# Patient Record
Sex: Male | Born: 1965
Health system: Southern US, Community
[De-identification: ages and names within clinical notes are randomized; demographics above are authoritative.]

## PROBLEM LIST (undated history)

## (undated) DIAGNOSIS — F32A Depression, unspecified: Secondary | ICD-10-CM

## (undated) DIAGNOSIS — F0390 Unspecified dementia without behavioral disturbance: Secondary | ICD-10-CM

## (undated) DIAGNOSIS — Z9889 Other specified postprocedural states: Secondary | ICD-10-CM

## (undated) DIAGNOSIS — F4310 Post-traumatic stress disorder, unspecified: Secondary | ICD-10-CM

## (undated) DIAGNOSIS — F319 Bipolar disorder, unspecified: Secondary | ICD-10-CM

## (undated) DIAGNOSIS — I639 Cerebral infarction, unspecified: Secondary | ICD-10-CM

## (undated) DIAGNOSIS — R011 Cardiac murmur, unspecified: Secondary | ICD-10-CM

## (undated) DIAGNOSIS — I10 Essential (primary) hypertension: Secondary | ICD-10-CM

## (undated) DIAGNOSIS — N200 Calculus of kidney: Secondary | ICD-10-CM

## (undated) DIAGNOSIS — R112 Nausea with vomiting, unspecified: Secondary | ICD-10-CM

## (undated) DIAGNOSIS — Z8719 Personal history of other diseases of the digestive system: Secondary | ICD-10-CM

## (undated) DIAGNOSIS — G4730 Sleep apnea, unspecified: Secondary | ICD-10-CM

## (undated) DIAGNOSIS — F419 Anxiety disorder, unspecified: Secondary | ICD-10-CM

## (undated) DIAGNOSIS — M5030 Other cervical disc degeneration, unspecified cervical region: Secondary | ICD-10-CM

## (undated) DIAGNOSIS — G8929 Other chronic pain: Secondary | ICD-10-CM

## (undated) DIAGNOSIS — M5136 Other intervertebral disc degeneration, lumbar region: Secondary | ICD-10-CM

## (undated) DIAGNOSIS — M51369 Other intervertebral disc degeneration, lumbar region without mention of lumbar back pain or lower extremity pain: Secondary | ICD-10-CM

## (undated) DIAGNOSIS — C649 Malignant neoplasm of unspecified kidney, except renal pelvis: Secondary | ICD-10-CM

## (undated) DIAGNOSIS — N189 Chronic kidney disease, unspecified: Secondary | ICD-10-CM

## (undated) DIAGNOSIS — E785 Hyperlipidemia, unspecified: Secondary | ICD-10-CM

## (undated) DIAGNOSIS — Z87442 Personal history of urinary calculi: Secondary | ICD-10-CM

## (undated) DIAGNOSIS — M1990 Unspecified osteoarthritis, unspecified site: Secondary | ICD-10-CM

## (undated) DIAGNOSIS — F329 Major depressive disorder, single episode, unspecified: Secondary | ICD-10-CM

## (undated) HISTORY — DX: Depression, unspecified: F32.A

## (undated) HISTORY — DX: Cerebral infarction, unspecified: I63.9

## (undated) HISTORY — DX: Other cervical disc degeneration, unspecified cervical region: M50.30

## (undated) HISTORY — DX: Post-traumatic stress disorder, unspecified: F43.10

## (undated) HISTORY — DX: Other intervertebral disc degeneration, lumbar region: M51.36

## (undated) HISTORY — DX: Other intervertebral disc degeneration, lumbar region without mention of lumbar back pain or lower extremity pain: M51.369

## (undated) HISTORY — PX: URETERAL REIMPLANTION: SHX2611

## (undated) HISTORY — DX: Hyperlipidemia, unspecified: E78.5

## (undated) HISTORY — PX: HERNIA REPAIR: SHX51

## (undated) HISTORY — PX: OTHER SURGICAL HISTORY: SHX169

## (undated) HISTORY — DX: Major depressive disorder, single episode, unspecified: F32.9

## (undated) HISTORY — DX: Calculus of kidney: N20.0

---
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
## 1998-12-07 ENCOUNTER — Encounter: Payer: Self-pay | Admitting: Internal Medicine

## 1998-12-07 ENCOUNTER — Encounter: Admission: RE | Admit: 1998-12-07 | Discharge: 1998-12-07 | Payer: Self-pay | Admitting: Internal Medicine

## 1998-12-07 ENCOUNTER — Ambulatory Visit (HOSPITAL_COMMUNITY): Admission: RE | Admit: 1998-12-07 | Discharge: 1998-12-07 | Payer: Self-pay | Admitting: Internal Medicine

## 1999-07-22 DIAGNOSIS — C649 Malignant neoplasm of unspecified kidney, except renal pelvis: Secondary | ICD-10-CM

## 1999-07-22 HISTORY — DX: Malignant neoplasm of unspecified kidney, except renal pelvis: C64.9

## 1999-12-16 ENCOUNTER — Encounter: Payer: Self-pay | Admitting: Emergency Medicine

## 1999-12-16 ENCOUNTER — Encounter (INDEPENDENT_AMBULATORY_CARE_PROVIDER_SITE_OTHER): Payer: Self-pay | Admitting: *Deleted

## 1999-12-16 ENCOUNTER — Inpatient Hospital Stay (HOSPITAL_COMMUNITY): Admission: EM | Admit: 1999-12-16 | Discharge: 1999-12-24 | Payer: Self-pay | Admitting: Emergency Medicine

## 1999-12-16 ENCOUNTER — Encounter: Payer: Self-pay | Admitting: Urology

## 1999-12-17 ENCOUNTER — Encounter: Payer: Self-pay | Admitting: Urology

## 1999-12-18 ENCOUNTER — Encounter: Payer: Self-pay | Admitting: Urology

## 1999-12-19 ENCOUNTER — Encounter: Payer: Self-pay | Admitting: Urology

## 1999-12-19 HISTORY — PX: OTHER SURGICAL HISTORY: SHX169

## 2000-06-08 ENCOUNTER — Encounter: Admission: RE | Admit: 2000-06-08 | Discharge: 2000-06-08 | Payer: Self-pay | Admitting: Urology

## 2000-06-08 ENCOUNTER — Encounter: Payer: Self-pay | Admitting: Urology

## 2000-11-30 ENCOUNTER — Encounter: Payer: Self-pay | Admitting: Urology

## 2000-11-30 ENCOUNTER — Encounter: Admission: RE | Admit: 2000-11-30 | Discharge: 2000-11-30 | Payer: Self-pay | Admitting: Urology

## 2001-05-07 ENCOUNTER — Encounter: Payer: Self-pay | Admitting: Urology

## 2001-05-07 ENCOUNTER — Encounter: Admission: RE | Admit: 2001-05-07 | Discharge: 2001-05-07 | Payer: Self-pay | Admitting: Urology

## 2002-03-14 ENCOUNTER — Encounter: Admission: RE | Admit: 2002-03-14 | Discharge: 2002-03-14 | Payer: Self-pay | Admitting: Urology

## 2002-03-14 ENCOUNTER — Encounter: Payer: Self-pay | Admitting: Urology

## 2002-07-21 HISTORY — PX: OTHER SURGICAL HISTORY: SHX169

## 2003-07-28 ENCOUNTER — Ambulatory Visit (HOSPITAL_COMMUNITY): Admission: RE | Admit: 2003-07-28 | Discharge: 2003-07-28 | Payer: Self-pay | Admitting: Urology

## 2003-10-24 ENCOUNTER — Emergency Department (HOSPITAL_COMMUNITY): Admission: EM | Admit: 2003-10-24 | Discharge: 2003-10-25 | Payer: Self-pay | Admitting: Emergency Medicine

## 2003-10-30 ENCOUNTER — Encounter: Admission: RE | Admit: 2003-10-30 | Discharge: 2003-10-30 | Payer: Self-pay | Admitting: Family Medicine

## 2003-12-07 ENCOUNTER — Ambulatory Visit (HOSPITAL_COMMUNITY): Admission: RE | Admit: 2003-12-07 | Discharge: 2003-12-07 | Payer: Self-pay | Admitting: *Deleted

## 2004-06-06 ENCOUNTER — Encounter: Admission: RE | Admit: 2004-06-06 | Discharge: 2004-06-06 | Payer: Self-pay | Admitting: Internal Medicine

## 2004-08-29 ENCOUNTER — Ambulatory Visit (HOSPITAL_COMMUNITY): Admission: RE | Admit: 2004-08-29 | Discharge: 2004-08-29 | Payer: Self-pay | Admitting: Urology

## 2005-03-19 ENCOUNTER — Encounter: Admission: RE | Admit: 2005-03-19 | Discharge: 2005-03-19 | Payer: Self-pay | Admitting: General Surgery

## 2006-07-19 ENCOUNTER — Emergency Department (HOSPITAL_COMMUNITY): Admission: EM | Admit: 2006-07-19 | Discharge: 2006-07-19 | Payer: Self-pay | Admitting: Emergency Medicine

## 2006-09-16 ENCOUNTER — Inpatient Hospital Stay (HOSPITAL_COMMUNITY): Admission: RE | Admit: 2006-09-16 | Discharge: 2006-09-17 | Payer: Self-pay | Admitting: Neurosurgery

## 2006-09-16 HISTORY — PX: LAMINECTOMY AND MICRODISCECTOMY CERVICAL SPINE: SHX1912

## 2010-05-14 ENCOUNTER — Emergency Department (HOSPITAL_COMMUNITY): Admission: EM | Admit: 2010-05-14 | Discharge: 2010-05-14 | Payer: Self-pay | Admitting: Emergency Medicine

## 2010-10-09 ENCOUNTER — Other Ambulatory Visit: Payer: Self-pay | Admitting: Urology

## 2010-10-09 ENCOUNTER — Ambulatory Visit (HOSPITAL_COMMUNITY)
Admission: RE | Admit: 2010-10-09 | Discharge: 2010-10-09 | Disposition: A | Discharge status: Home / Self Care | Payer: MEDICARE | Source: Ambulatory Visit | Attending: Urology | Admitting: Urology

## 2010-10-09 DIAGNOSIS — Z85528 Personal history of other malignant neoplasm of kidney: Secondary | ICD-10-CM | POA: Insufficient documentation

## 2010-10-09 DIAGNOSIS — Z09 Encounter for follow-up examination after completed treatment for conditions other than malignant neoplasm: Secondary | ICD-10-CM | POA: Insufficient documentation

## 2010-10-09 DIAGNOSIS — R079 Chest pain, unspecified: Secondary | ICD-10-CM | POA: Insufficient documentation

## 2010-11-04 ENCOUNTER — Encounter (HOSPITAL_COMMUNITY)
Admission: RE | Admit: 2010-11-04 | Discharge: 2010-11-04 | Disposition: A | Discharge status: Home / Self Care | Payer: 59 | Source: Ambulatory Visit | Attending: Surgery | Admitting: Surgery

## 2010-11-04 DIAGNOSIS — Z01818 Encounter for other preprocedural examination: Secondary | ICD-10-CM | POA: Insufficient documentation

## 2010-11-04 DIAGNOSIS — Z01812 Encounter for preprocedural laboratory examination: Secondary | ICD-10-CM | POA: Insufficient documentation

## 2010-11-04 DIAGNOSIS — Z01810 Encounter for preprocedural cardiovascular examination: Secondary | ICD-10-CM | POA: Insufficient documentation

## 2010-11-04 LAB — SURGICAL PCR SCREEN
MRSA, PCR: NEGATIVE
Staphylococcus aureus: NEGATIVE

## 2010-11-04 LAB — BASIC METABOLIC PANEL
CO2: 30 mEq/L (ref 19–32)
Calcium: 9.2 mg/dL (ref 8.4–10.5)
Chloride: 103 mEq/L (ref 96–112)
Creatinine, Ser: 1.15 mg/dL (ref 0.4–1.5)
GFR calc Af Amer: >60 mL/min (ref >60)
GFR calc non Af Amer: >60 mL/min (ref >60)
Glucose, Bld: 98 mg/dL (ref 70–99)
Potassium: 3.9 mEq/L (ref 3.5–5.1)
Sodium: 140 mEq/L (ref 135–145)

## 2010-11-04 LAB — CBC
HCT: 42.8 % (ref 39.0–52.0)
Hemoglobin: 14.8 g/dL (ref 13.0–17.0)
MCH: 30.3 pg (ref 26.0–34.0)
MCHC: 34.6 g/dL (ref 30.0–36.0)
Platelets: 222 10*3/uL (ref 150–400)
RDW: 14 % (ref 11.5–15.5)

## 2010-11-04 LAB — HEPATIC FUNCTION PANEL
ALT: 32 U/L (ref 0–53)
Albumin: 3.7 g/dL (ref 3.5–5.2)
Alkaline Phosphatase: 93 U/L (ref 39–117)
Bilirubin, Direct: 0.1 mg/dL (ref 0.0–0.3)
Indirect Bilirubin: 0.5 mg/dL (ref 0.3–0.9)
Total Protein: 6.5 g/dL (ref 6.0–8.3)

## 2010-11-08 ENCOUNTER — Inpatient Hospital Stay (HOSPITAL_COMMUNITY)
Admission: RE | Admit: 2010-11-08 | Discharge: 2010-11-14 | DRG: 336 | Disposition: A | Discharge status: Home / Self Care | Payer: 59 | Source: Ambulatory Visit | Attending: Surgery | Admitting: Surgery

## 2010-11-08 DIAGNOSIS — M47812 Spondylosis without myelopathy or radiculopathy, cervical region: Secondary | ICD-10-CM | POA: Diagnosis present

## 2010-11-08 DIAGNOSIS — K436 Other and unspecified ventral hernia with obstruction, without gangrene: Principal | ICD-10-CM | POA: Diagnosis present

## 2010-11-08 DIAGNOSIS — E7800 Pure hypercholesterolemia, unspecified: Secondary | ICD-10-CM | POA: Diagnosis present

## 2010-11-08 DIAGNOSIS — I10 Essential (primary) hypertension: Secondary | ICD-10-CM | POA: Diagnosis present

## 2010-11-08 DIAGNOSIS — Z87891 Personal history of nicotine dependence: Secondary | ICD-10-CM

## 2010-11-08 DIAGNOSIS — F411 Generalized anxiety disorder: Secondary | ICD-10-CM | POA: Diagnosis present

## 2010-11-08 DIAGNOSIS — Z01812 Encounter for preprocedural laboratory examination: Secondary | ICD-10-CM

## 2010-11-08 DIAGNOSIS — Z01810 Encounter for preprocedural cardiovascular examination: Secondary | ICD-10-CM

## 2010-11-08 DIAGNOSIS — K560 Paralytic ileus: Secondary | ICD-10-CM | POA: Diagnosis not present

## 2010-11-08 DIAGNOSIS — Z8553 Personal history of malignant neoplasm of renal pelvis: Secondary | ICD-10-CM

## 2010-11-08 DIAGNOSIS — Z905 Acquired absence of kidney: Secondary | ICD-10-CM

## 2010-11-08 DIAGNOSIS — M129 Arthropathy, unspecified: Secondary | ICD-10-CM | POA: Diagnosis present

## 2010-11-08 HISTORY — PX: OTHER SURGICAL HISTORY: SHX169

## 2010-11-09 LAB — COMPREHENSIVE METABOLIC PANEL
ALT: 36 U/L (ref 0–53)
AST: 37 U/L (ref 0–37)
Albumin: 3.2 g/dL — ABNORMAL LOW (ref 3.5–5.2)
Calcium: 8.7 mg/dL (ref 8.4–10.5)
Chloride: 98 mEq/L (ref 96–112)
Creatinine, Ser: 1.16 mg/dL (ref 0.4–1.5)
GFR calc Af Amer: >60 mL/min (ref >60)
Glucose, Bld: 126 mg/dL — ABNORMAL HIGH (ref 70–99)
Potassium: 3.9 mEq/L (ref 3.5–5.1)
Sodium: 136 mEq/L (ref 135–145)
Total Bilirubin: 0.9 mg/dL (ref 0.3–1.2)
Total Protein: 6.6 g/dL (ref 6.0–8.3)

## 2010-11-09 LAB — CBC
HCT: 39 % (ref 39.0–52.0)
Hemoglobin: 13.7 g/dL (ref 13.0–17.0)
MCHC: 35.1 g/dL (ref 30.0–36.0)
MCV: 88.2 fL (ref 78.0–100.0)
Platelets: 232 10*3/uL (ref 150–400)
RBC: 4.42 MIL/uL (ref 4.22–5.81)
RDW: 14.3 % (ref 11.5–15.5)
WBC: 13.1 10*3/uL — ABNORMAL HIGH (ref 4.0–10.5)

## 2010-11-09 LAB — GLUCOSE, CAPILLARY: Glucose-Capillary: 108 mg/dL — ABNORMAL HIGH (ref 70–99)

## 2010-11-10 LAB — GLUCOSE, CAPILLARY
Glucose-Capillary: 110 mg/dL — ABNORMAL HIGH (ref 70–99)
Glucose-Capillary: 107 mg/dL — ABNORMAL HIGH (ref 70–99)

## 2010-11-11 ENCOUNTER — Inpatient Hospital Stay (HOSPITAL_COMMUNITY): Payer: 59

## 2010-11-11 LAB — BASIC METABOLIC PANEL
BUN: 8 mg/dL (ref 6–23)
Calcium: 8.6 mg/dL (ref 8.4–10.5)
Chloride: 100 mEq/L (ref 96–112)
Creatinine, Ser: 1.04 mg/dL (ref 0.4–1.5)
GFR calc Af Amer: >60 mL/min (ref >60)

## 2010-11-11 LAB — CBC
HCT: 39.6 % (ref 39.0–52.0)
Hemoglobin: 13.2 g/dL (ref 13.0–17.0)
MCH: 30.3 pg (ref 26.0–34.0)
MCHC: 33.3 g/dL (ref 30.0–36.0)
MCV: 90.8 fL (ref 78.0–100.0)
RDW: 14 % (ref 11.5–15.5)

## 2010-11-13 LAB — CREATININE, SERUM
GFR calc Af Amer: >60 mL/min (ref >60)
GFR calc non Af Amer: >60 mL/min (ref >60)

## 2010-11-13 LAB — CBC
MCV: 88.5 fL (ref 78.0–100.0)
Platelets: 271 10*3/uL (ref 150–400)
RBC: 4.34 MIL/uL (ref 4.22–5.81)
RDW: 13.9 % (ref 11.5–15.5)
WBC: 10.9 10*3/uL — ABNORMAL HIGH (ref 4.0–10.5)

## 2010-11-18 NOTE — Op Note (Signed)
NAME:  Jake Brown, COVIN NO.:  [id number]  MEDICAL RECORD NO.:  [id number]           PATIENT TYPE:  I  LOCATION:  5124                         FACILITY:  [facility]  PHYSICIAN:  Ardeth Sportsman, MD     DATE OF BIRTH:  1966-02-25  DATE OF PROCEDURE:  11/08/2010 DATE OF DISCHARGE:                              OPERATIVE REPORT   PRIMARY CARE PHYSICIAN:  Broadus John T. Pamalee Leyden, MD at [facility].  UROLOGIST:  Bertram Millard. Dahlstedt, MD  OPERATING SURGEON:  Ardeth Sportsman, MD  ASSISTANT:  RN.  PREOPERATIVE DIAGNOSIS:  Right subcostal ventral incisional hernia, status post right radical nephrectomy.  POSTOPERATIVE DIAGNOSIS:  Right subcostal ventral incisional hernia, status post right radical nephrectomy.  PROCEDURE PERFORMED: 1. Laparoscopic lysis of adhesions x60 minutes. 2. Laparoscopic ventral hernia repair with mesh.  ANESTHESIA: 1. General anesthesia. 2. Local anesthetic and a field block around all fascial stitch and     port sites. 3. On-Q continuous bupivacaine with pain pump.  SPECIMENS:  None.  DRAINS:  None.  ESTIMATED BLOOD LOSS:  Minimal.  COMPLICATIONS:  None apparent.  INDICATIONS:  Mr. Balis is a 45 year old morbidly obese male who is the survivor of renal cancer status post right radical nephrectomy back in 2001, followed by Dr. Marcine Matar.  He notes some increasing bulge and asymmetry 6 years ago, it did not seem consistent with a hernia; however, it has become more pronounced and suspicious for it.  Pathophysiology of inguinal herniation with its natural histories were discussed.  After discussion, recommendation was made for diagnostic laparoscopy intervention with lysis of adhesions and possible ventral hernia repair.  Risks, benefits, and alternatives were discussed. Questions were answered, and he agreed to proceed.  OPERATIVE FINDINGS:  He had a 7 x 17 cm ellipsoid oblique ventral hernia in the  posterior half of his nephrectomy incision.  It has omentum stuck it, transverse colon is nearby, but not incarcerated.  DESCRIPTION OF PROCEDURE:  Informed consent was confirmed.  The patient underwent general anesthesia without any difficulty.  He received IV antibiotics.  A Foley catheter was sterilely placed.  He was positioned in left side down decubitus fashion with a sand bag in place at about 45 degree angle.  We did careful positioning with anesthesia to help protect both upper extremities.  He was securely taped in numerous locations as well with belt.  His chest and abdomen were clipped, prepped, and draped in sterile fashion.  Surgical time-out confirmed our plan.  I placed a #5-mm port through the inferior part of the umbilicus using a cutdown technique.  I induced carbon dioxide insufflation.  A camera inspection noted clean entry.  Under direct visualization, I placed 5-mm ports in the right lower quadrant and left mid abdomen.  I began to do lysis of adhesions.  I primarily used a cold scissors and blunt dissections as well as some focused cautery.  I did take down the falciform ligament and took the anterior hepatic ligament about half way up towards the diaphragm to get good view.  I could see the transverse colon was near by, but I had  5 cm of greater omentum between the colon and the hernia.  Eventually, I freed that all off and came laterally and mobilized the right hepatic flexure a little bit to make sure he had a plenty of view and good overlap.  I measured out the defect and saw the obvious laxity and thinning of the wall, this was ventral hernia.  I choose a 20 x 30 cm dual-sided mesh (Parietex/Seprafilm).  I rolled the abdomen and secured it to the anterior abdominal wall using #1 interrupted Prolene x16 around the periphery.  The lower half and medial quadrants were able to tack transfascially.  I did have to take bites along the right subcostal ridge on  the right superior edge and corner as there were about 3 cm of healthy fascia between the hernia and the defect itself.  I took those bites a little more towards the central part of the mesh, his periphery side had plenty of overlap.  I gradually did this pull to make sure I had at least 5 cm circumferential coverage, I did.  I did camera inspection and noted good hemostasis.  I did lower the insufflation, tied the fascial stitch down.  I did reinspection and had good coverage.  I used laparoscopic spiral metal tacker x2 to help secure around the periphery, and then a small absorbable tacker as well just to have a few extra tacks to help pull the central part of the mesh down.  His abdominal was rather thin, may be only 2 cm at the most with insufflations and I felt like those tacks were getting to the posterior fascia well.  I placed a stitch around the 0 port site.  I had placed an epigastric port site about 10 mm in size to get the mesh in and I closed that down using 0 Vicryl stitch.  I placed the On-Q catheter in the preperitoneal plane using laparoscopic tunnelers under direct visualization.  I evacuated carbon dioxide and removed all the ports.  I tied the fascial stitch down.  I closed the skin and placed Steri-Strips on the puncture sites of the transabdominal fascial stitches.  An On-Q was connected. Abdominal binder was placed.  The patient was extubated and taken to recovery room in stable condition.  I discussed postop care with the patient in detail in my office and again described the surgery.  I am also discussing with his family as well.  Given the location and complexity, I will monitor him at least overnight as I suspect to have above average pain requirements given the large defect and being near the flank and subcostal ridge.     Ardeth Sportsman, MD     SCG/MEDQ  D:  11/08/2010  T:  11/09/2010  Job:  [id number]  Electronically Signed by Karie Soda MD on  11/18/2010 01:13:09 PM

## 2010-11-18 NOTE — Discharge Summary (Signed)
NAME:  Jake Brown, Jake Brown NO.:  [id number]  MEDICAL RECORD NO.:  [id number]           PATIENT TYPE:  I  LOCATION:  5124                         FACILITY:  [facility]  PHYSICIAN:  Ardeth Sportsman, MD     DATE OF BIRTH:  28-Feb-1966  DATE OF ADMISSION:  11/08/2010 DATE OF DISCHARGE:  11/14/2010                              DISCHARGE SUMMARY   PRIMARY CARE PHYSICIAN:  Dr. Lynnea Ferrier at [facility].  UROLOGIST:  Bertram Millard. Dahlstedt, MD  SURGEON:  Ardeth Sportsman, MD  PRINCIPAL DIAGNOSES: 1. Right ventral wall incisional hernia, incarcerated with omentum,     right upper quadrant. 2. Postoperative ileus. 3. Hypertension. 4. Cervical degenerative disk disease and spondylosis.  OTHER DIAGNOSES: 1. Morbid obesity. 2. Hypercholesterolemia. 3. Renal cancer, status post transabdominal right radical nephrectomy     for right renal cell carcinoma.  PROCEDURE PERFORMED:  Laparoscopic lysis of adhesions and ventral hernia repair with mesh on November 08, 2010.  DISCHARGE MEDICATIONS:  As noted in the chart include amlodipine 5 mg p.o. daily, psyllium p.o. daily, hydrocodone/Tylenol 1-2 p.o. q.4 h. p.r.n. pain, Tylenol p.r.n. pain, ice pads, heating pads, warm showers p.r.n. pain, and I believe, he is on Losartan and hydrochlorothiazide as well.  HOSPITAL COURSE:  [name] is a 45 year old morbidly obese male who had a right radical nephrectomy for large renal cell carcinoma for which he is a survivor for over a decade.  He developed pain and discomfort in his right upper quadrant subcostal incision and workup was concerning for hernia.  He was taken to the operating room, did laparoscopic lysis of adhesions and ventral hernia repair with mesh.  Postoperatively, he was mobilized and started on p.o.  However, he developed some nausea, vomiting, and abdominal pain.  He required a nasogastric tube placement.  Workup by radiographic films was  consistent with ileus.  He was mobilized.  We controlled his pain and tried to maximize nonnarcotic pain control.  Given his prior kidney, having one kidney remaining, we held off nonsteroidals and used primarily acetaminophen IV and then enterally.  Gradually, he began to have flatus.  His nasogastric tube was clamped.  He had bowel movements.  We moved it.  He was advanced on his diet.  By the time of discharge, he was walking well in the hallways.  He had good pain control on oral hydrocodone (we switched him off oxycodone). He was having flatus and bowel movements.  He was tolerating solid diet.  Patient improved and __________ discharged home with following instructions: 1. He is to return to see me in about 2-3 weeks. 2. He should use ice, heat, warm showers, Tylenol, hydrocodone in     combination for pain control. 3. He should walk an hour a day. 4. He should taken psyllium fiber to avoid any further issues of     constipation or diarrhea. 5. He should call if he has worsening fever, chills, sweats,     uncontrolled pain, nausea, vomiting, diarrhea, and other concerns.     Ardeth Sportsman, MD     SCG/MEDQ  D:  11/14/2010  T:  11/14/2010  Job:  [id number]  cc:   [facility] Dr. Lora Paula. Dahlstedt, M.D.  Electronically Signed by Karie Soda MD on 11/18/2010 01:13:16 PM

## 2010-11-27 ENCOUNTER — Encounter (INDEPENDENT_AMBULATORY_CARE_PROVIDER_SITE_OTHER): Payer: Self-pay | Admitting: Surgery

## 2010-12-06 NOTE — Consult Note (Signed)
[place]  Patient:    Jake Brown, Jake Brown                  MRN: [id number] Adm. Date:  [id number] Attending:  Tania Ade Dictator:   [phone] CC:         Jake Brown, M.D.             Claudette Laws, M.D.                          Consultation Report  REASON FOR CONSULTATION: Jake Brown is a 45 year old gentleman who is being admitted to the hospital today by the urology service for evaluation and treatment of a large right renal cell carcinoma.  I have been asked to see the patient because of nausea, vomiting, and abdominal pain that started late last evening and early this morning.  HISTORY OF PRESENT ILLNESS: The patients history is that he has been a previously healthy man with bouts of recurrent prostatitis, treated in the past with antibiotics.  He noted gross hematuria starting approximately one month prior to admission and he sought medical attention from his primary care physician, Dr. Dorothe Pea, who got a urinalysis which showed gross hematuria.  He was referred to Dr. Etta Grandchild and an ultrasound was done which showed a mass in the right kidney; that was Thursday of this past week.  Friday he had a CAT scan done at [facility] and I have a faxed report obtained showing a large right renal mass 18 cm in size, and no other pathology apparently evident.  The patient was to have further work-up done this coming Friday but last night the patient developed abdominal pain, crampy in nature with a crescendo-decrescendo pattern, each episode lasting for approximately one-half hour.  This started late last night and early into this morning.  He subsequently vomited gastric contents and undigested food was noted.  No blood was seen.  He had diarrhea associated with this with a number of episodes of loose stools, the last one occurring at approximately 3 a.m.  He continued to vomit, however, up until just a few hours ago, and  I had seen him earlier this morning in the emergency room at approximately 8:30 a.m.  He states that the pain comes on followed by the vomiting and diarrhea, and the pain is self-relieved.  He came to the emergency room and was given Demerol with relief of his pain.  PAST MEDICAL HISTORY: The patient states that prior to this he had otherwise been healthy without any medical problems and no hospitalizations.  PAST SURGICAL HISTORY: No surgeries.  MEDICATIONS: He takes no chronic medications.  ALLERGIES: No known drug allergies.  REVIEW OF SYSTEMS: Otherwise negative.  There has been no fever, chills, night sweats, no weight loss, chest pain, shortness of breath, endocrine disturbances, no history of diabetes, hypertension, neurologic deficits, and no prior history of GI disorder.  No change in bowel habits, no hematochezia. Melena has been noted and the vomitus, as noted, has been of food.  He does have reflux symptomatology.  FAMILY HISTORY: Only notable for reflux in his father.  SOCIAL HISTORY: The patient does not smoke or drink alcohol to any significant degree.  PHYSICAL EXAMINATION:  GENERAL: On examination at this time he has received Demerol but he is alert and oriented, slightly drowsy, but responds appropriately to all questions. He is a very well-developed, well-nourished, muscular white  male.  HEENT: Grossly unremarkable.  NECK: Thyroid not enlarged.  No bruits were heard.  LUNGS: Clear.  HEART: Regular rhythm without murmurs, rubs, or gallops.  ABDOMEN: Tender throughout, though more so in the left upper quadrant than other areas.  Bowel sounds are present.  RECTAL: Deferred at this time.  EXTREMITIES: Normal.  SKIN: Tattoo changes over the arms, shoulders, and upper back.  LABORATORY DATA: CBC, amylase, lipase, CMET are all unremarkable.  Abdominal x-ray shows a large right kidney shadow and flat plate shows a distended stomach, and upright shows an  air fluid level in the stomach area.  IMPRESSION:  1. Renal cell carcinoma.  2. Dilated gastritic bubble with air fluid levels, probably represents     gastritic outlet obstruction, presumably extrinsically from tumor     compression, probably of the duodenum either directly or by lymph node     enlargement there.  3. Tattoos, rule out hepatitis C.  RECOMMENDATIONS: The CT scan of just a few days ago does not mention bowel obstruction and therefore I think an Upper GI Series with an NG tube in place would be helpful.  The NG tube could be used then to suction out the material if obstruction is present, which I think it will be, and subsequently would then use the NG tube to decompress the stomach.  I believe he will probably have a nephrectomy and he may require a general surgery consultation to evaluate the possibility of some surgical treatment.  He may need to have some intestinal resection if the tumor is impinging on small bowel.  With acute nature would check a hepatitis C virus antibody study just because of the tattoos as he would be at increased risk for hepatitis C and although this is not an acute problem if he recovers eventfully from surgery consideration at some later date for treatment might be necessary, and if his hepatitis C virus study is in fact positive would ask surgery to do a liver biopsy during surgery to assess liver status related to the HCV infection if present. DD:  12/16/99 TD:  12/16/99 Job: [id number]

## 2010-12-06 NOTE — Op Note (Signed)
[place]  Patient:    Jake Brown, Jake Brown                  MRN: [id number] Proc. Date: 12/19/99 Adm. Date:  [id number] Attending:  Monica Becton                           Operative Report  PREOPERATIVE DIAGNOSIS:  Large right renal cell carcinoma.  POSTOPERATIVE DIAGNOSIS:  Large right renal cell carcinoma.  PROCEDURE:  Transabdominal right radical nephrectomy.  SURGEON:  Claudette Laws, M.D.  ASSISTANT:  Norton Blizzard, M.D.  DESCRIPTION OF PROCEDURE:  The patient was prepped and draped in the supine position under intubated general anesthesia.  The chest was torqued about 45 degrees over inflatable beanbag.  Foley catheter was placed.  The right arm was  brought over to an arm rest.  Transabdominal transverse incision was made about a point halfway between the xiphoid process and the umbilicus, across to the 8th r 9th costal cartilage.  The rectus muscle was cut and then the peritoneum was identified.  The peritoneal cavity was opened. The incision was carried laterally, excising the internal, external and transversalis layer.  At this point, it was  felt that we did not need to open the chest, so a Buchwalter retractor was brought into the field, and initial palpation of the liver showed no nodules.  The gallbladder contained no stones.  Initially, we reflected the ascending colon off the tumor mass, along the white line of Toldt. We went up just around the hepatic flexure and then down to the cecum. Because the kidney had been infarcted the day before, there were not many collaterals to contend with.  At this point, it was  felt that we could get around the tumor, lifted from beneath the liver off the duodenum and I would still have good exposure. The duodenum was kocherized, and the initial part of the dissection was securing the pedicle.  The renal vein was identified and secured with the articulating linear cutter,  the ATW 35 mm vascular, thin.  We then picked up the right renal artery and this was also secured with he linear cutter.  At this point, then, we were able to work our way down inferiorly. Initially, we picked up the gonadal vein and this was also secured with the articulating cutter.  The ureter was identified, cut and tied with 2-0 chromic distally.  Then working out way outside of [employer] fascia, we were able to deliver a huge mass without having to get into the liver or gallbladder; it came out nicely.We then inspected the wound. There was a small laceration in the capsule of the liver, which we secured with a 4-0 chromic.  There was some retroperitoneal  bleeding which we secured with a 3-0 Vicryl MH needle.  At the conclusion, there was no bleeding.  The wound appeared dry.  It was irrigated with copious amounts of saline.  The bowel contents were then dropped back into their normal position. The wound was then closed in two layers using a #1 PDS for the peritoneum and the external, internal and oblique layers. The rectus muscle was reapproximated and  then approximately 40 cc of 1/4% Marcaine was injected subcutaneously. Skin was  approximated with skin staples.  At the end of the case, the sponge and needle counts were correct. Bleeding was estimated at only about 200-300 cc.  No blood  was given. The patient tolerated the procedure well and was returned to the recovery room in satisfactory condition. DD:  12/19/99 TD:  12/23/99 Job: [id number]

## 2010-12-06 NOTE — Discharge Summary (Signed)
[place]  Patient:    Jake Brown, Jake Brown                  MRN: [id number] Adm. Date:  [id number] Disc. Date: [id number] Attending:  Monica Becton CC:         Jethro Bastos, M.D., [facility] [place]             D. Karle Plumber, M.D.             Sabino Gasser, M.D.                           Discharge Summary  PRESENT ILLNESS:  This 45 year old otherwise healthy man came in to our office two weeks ago with some painless gross hematuria.  Subsequent CT scan revealed a large mass in the right kidney.  This was an obvious renal cell carcinoma. Then suddenly he developed some nausea and vomiting and was admitted to the hospital here on Dec 16, 1999, and was seen by Dr. Sabino Gasser and thought to have gastric outlet obstruction from his large renal mass which was impinging on the duodenum.  An NG tube was placed, and he was admitted for further workup and plans to remove the mass which was about 18 cm in size.  The patient otherwise is in good health, although he is quite heavy, but no major medical problems.  No allergies.  He is a Technical brewer for [employer].  The only thing significant in the history is that he has multiple tattoos on his body and, therefore, would be at risk for hepatitis C.  PERTINENT LABORATORY DATA:  EKG showed normal sinus rhythm with some left atrial enlargement.  His preop bone scan was negative for metastatic disease as was a CT scan of the chest.  He also had an upper GI series showing obstruction at the duodenal level.  On laboratory data, his hemoglobin was 14.5, hematocrit 42.9.  Electrolytes were normal with a BUN of 10, creatinine 1.2.  Hepatitic C antibody was negative.  Anti-HCV was negative.  His postop hemoglobin stabilized at 12.2, hematocrit 39.3.  Electrolytes remained normal with a postop creatinine of 1.4, BUN 12.  PT and PTT were normal.  Pathology report was a renal cell carcinoma  with negative margins, no invasion into the pararenal fat, and no invasion of the renal vein.  This was a PT II lesion.  COURSE IN THE HOSPITAL:  The patient was admitted as an emergency on Dec 16, 1999.  NG tube was placed.  He was started on IV fluids.  Appropriate workup as mentioned in the Present Illness ensued, and then preparations were made for surgery.  One day prior to surgery, he was taken to radiology where he underwent selective embolization and infarction of the right kidney with absolute alcohol.  This was done in an attempt to decrease his collateral vessels and hopefully make the surgery less bloody.  Indeed, this proved to be the case at surgery.  He had a large renal mass which we removed transabdominally on Dec 19, 1999.  Postop, he had an uneventful, essentially afebrile postop course.  He did have some pain controlled with continuous morphine drip, and then gradually he was weaned off the drip and switched to p.o. medications.  He gradually started eating again and at discharge was afebrile.  The incision was healing well. He had a normal diet, was feeling good.  Pain was controlled with p.o. Tylox.  PLAN:  The plan now is to remove his staples in about five days, and the followup will be a CT scan in approximately six months.  He is to call me for any problems in the meantime.  Incidentally, he does have mitral valve prolapse and was covered with Cipro preop.  FINAL IMPRESSION: 1. An 18 cm renal cell carcinoma, right kidney (pathologic stage PT II). 2. Exogenous obesity. 3. History of mitral valve prolapse. 4. Multiple tattoos on body.  CONDITION ON DISCHARGE:  Improved.  OPERATION: 1. Transabdominal right radical nephrectomy. 2. Preop infarction of right kidney in radiology.  DISCHARGE MEDICATIONS:  To include Tylox #50 p.r.n. pain.  DISPOSITION:  Regular diet, force fluids, limited activity.  To return to the office in five to six days for skin staple  removal. DD:  12/24/99 TD:  12/26/99 Job: [id number]
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
## 2010-12-06 NOTE — Op Note (Signed)
NAME:  Jake Brown, Jake Brown NO.:  [id number]   MEDICAL RECORD NO.:  [id number]          PATIENT TYPE:  INP   LOCATION:  3008                         FACILITY:  [facility]   PHYSICIAN:  Hewitt Shorts, M.D.DATE OF BIRTH:  07/19/1966   DATE OF PROCEDURE:  09/16/2006  DATE OF DISCHARGE:                               OPERATIVE REPORT   PREOPERATIVE DIAGNOSES:  Right C6-7 cervical disk herniation, cervical  spondylosis, cervical degenerative disk disease and cervical  radiculopathy.   POSTOPERATIVE DIAGNOSES:  Right C6-7 cervical disk herniation, cervical  spondylosis, cervical degenerative disk disease and cervical  radiculopathy.   PROCEDURES:  Right C6-7 posterior cervical laminotomy, foraminotomy and  microdiskectomy with microdissection.   SURGEON:  Hewitt Shorts, M.D.   ASSISTANT:  Clydene Fake, M.D.   ANESTHESIA:  General endotracheal.   INDICATIONS:  The patient is a 45 year old man, who presented with an  acute right C7 cervical radiculopathy.  He has multilevel degenerative  disk disease with spondylosis, but he had a large disk herniation  laterally to the right at C6-7, and the decision was made to proceed  with a posterior cervical diskectomy.   DESCRIPTION OF PROCEDURES:  The patient was brought to the operating  room, placed under general endotracheal anesthesia.  The 3-pin Mayfield  headholder was applied.  Prior to positioning the patient, a central  line was placed by the anesthesia service, with x-ray confirmation with  location within the right atrium, and during the procedure, the  anesthesia service monitored the patient with both end-tidal CO2  monitoring and precordial Doppler monitoring.  No air embolisms were  experienced during the procedure.   The patient was brought to a sitting position, and then the posterior  aspect of the neck was prepped with Betadine Soap and Solution and  draped in a sterile fashion.  The midline was  infiltrated with local  anesthetic with epinephrine, and the C6-7 level identified and a midline  incision was made over the C6-7 level and carried down through the  subcutaneous tissue.  Bipolar cautery and electrocautery were used to  maintain hemostasis.  Dissection was carried down to the posterior  cervical fascia, which was incised on the right side of the midline, and  the paracervical musculature was dissected from the spinous processes  and laminae in subperiosteal fashion.  The C6-7 level was identified and  an x-ray taken to confirm the localization, and then a self-retaining  retractor was placed and we exposed the lateral margin of the  interlaminar space on the right side.  The operating microscope was  draped and brought into the field to provide additional magnification,  illumination and visualization, and a decompression was performed using  microdissection and microsurgical technique.  A laminotomy was performed  using the X-Max drill and Kerrison punches.  The ligamentum flavum was  carefully removed, and we identified the lateral margin of the thecal  sac and the exiting right C7 nerve root.  Caudal to the nerve root, we  encountered a large disk herniation.  The overlying epidural fascial  tissues were incised and the fragment extruded.  We  were able to remove  the free fragments in a piecemeal fashion, and in the end, all loose  fragments of disk material were removed from the epidural space with  good decompression of the exiting right C7 nerve root.  The wound was  irrigated with Bacitracin solution and checked for hemostasis, which was  established with the use of Gelfoam soaked in thrombin, as well as  bipolar cautery.  Once hemostasis was established and confirmed, we  proceeded with closure.  The deep fascia was closed with interrupted,  undyed 1 Vicryl sutures.  The subcutaneous layer was closed with  interrupted, inverted 2-0 undyed Vicryl sutures, and the  subcuticular  layer was closed with interrupted, inverted 2-0 and 3-0 undyed Vicryl  sutures, and the skin edges were approximated with Dermabond.  The  procedure was tolerated well and the estimated blood loss was 25 mL.  Sponge counts were correct.  Following surgery, the patient was brought  back to a supine position.  The 3-pin Mayfield headholder was removed.  The patient was reversed from the anesthetic, extubated and then  transferred to the recovery room for further care.      Hewitt Shorts, M.D.  Electronically Signed     RWN/MEDQ  D:  09/16/2006  T:  09/16/2006  Job:  [id number]

## 2011-05-26 ENCOUNTER — Encounter (HOSPITAL_COMMUNITY): Payer: Self-pay | Admitting: Pharmacy Technician

## 2011-06-03 ENCOUNTER — Encounter (HOSPITAL_COMMUNITY): Payer: Self-pay | Admitting: *Deleted

## 2011-06-03 NOTE — Progress Notes (Signed)
Pt reminded to take laxative and  bring blue folder

## 2011-06-05 ENCOUNTER — Ambulatory Visit (HOSPITAL_COMMUNITY)
Admission: RE | Admit: 2011-06-05 | Discharge: 2011-06-05 | Disposition: A | Discharge status: Home / Self Care | Payer: 59 | Source: Ambulatory Visit | Attending: Urology | Admitting: Urology

## 2011-06-05 ENCOUNTER — Encounter (HOSPITAL_COMMUNITY)
Admission: RE | Disposition: A | Discharge status: Home / Self Care | Payer: Self-pay | Source: Ambulatory Visit | Attending: Urology

## 2011-06-05 ENCOUNTER — Encounter (HOSPITAL_COMMUNITY): Payer: Self-pay | Admitting: *Deleted

## 2011-06-05 ENCOUNTER — Ambulatory Visit (HOSPITAL_COMMUNITY): Payer: 59

## 2011-06-05 DIAGNOSIS — N201 Calculus of ureter: Secondary | ICD-10-CM

## 2011-06-05 DIAGNOSIS — I10 Essential (primary) hypertension: Secondary | ICD-10-CM | POA: Insufficient documentation

## 2011-06-05 DIAGNOSIS — G4730 Sleep apnea, unspecified: Secondary | ICD-10-CM | POA: Insufficient documentation

## 2011-06-05 DIAGNOSIS — N200 Calculus of kidney: Secondary | ICD-10-CM | POA: Insufficient documentation

## 2011-06-05 HISTORY — DX: Chronic kidney disease, unspecified: N18.9

## 2011-06-05 HISTORY — DX: Unspecified osteoarthritis, unspecified site: M19.90

## 2011-06-05 HISTORY — DX: Cardiac murmur, unspecified: R01.1

## 2011-06-05 HISTORY — DX: Malignant neoplasm of unspecified kidney, except renal pelvis: C64.9

## 2011-06-05 HISTORY — DX: Essential (primary) hypertension: I10

## 2011-06-05 HISTORY — DX: Anxiety disorder, unspecified: F41.9

## 2011-06-05 SURGERY — LITHOTRIPSY, ESWL
Anesthesia: LOCAL | Laterality: Left

## 2011-06-05 MED ORDER — DEXTROSE-NACL 5-0.45 % IV SOLN
INTRAVENOUS | Status: DC
  Administered 2011-06-05 (×2): via INTRAVENOUS

## 2011-06-05 MED ORDER — DIAZEPAM 5 MG PO TABS
10.0000 mg | ORAL_TABLET | ORAL | Status: AC
  Administered 2011-06-05: 10 mg via ORAL

## 2011-06-05 MED ORDER — ONDANSETRON HCL 4 MG/2ML IJ SOLN
4.0000 mg | Freq: Once | INTRAMUSCULAR | Status: AC
  Administered 2011-06-05: 4 mg via INTRAVENOUS

## 2011-06-05 MED ORDER — DIPHENHYDRAMINE HCL 25 MG PO CAPS
25.0000 mg | ORAL_CAPSULE | ORAL | Status: AC
  Administered 2011-06-05: 25 mg via ORAL

## 2011-06-05 MED ORDER — ONDANSETRON HCL 4 MG/2ML IJ SOLN
INTRAMUSCULAR | Status: AC
  Administered 2011-06-05: 4 mg via INTRAVENOUS
  Filled 2011-06-05: qty 2

## 2011-06-05 MED ORDER — ACETAMINOPHEN 10 MG/ML IV SOLN
1000.0000 mg | Freq: Once | INTRAVENOUS | Status: AC
  Administered 2011-06-05: 1000 mg via INTRAVENOUS
  Filled 2011-06-05: qty 100

## 2011-06-05 MED ORDER — OXYCODONE-ACETAMINOPHEN 5-325 MG PO TABS
ORAL_TABLET | ORAL | Status: AC
  Administered 2011-06-05: 1 via ORAL
  Filled 2011-06-05: qty 1

## 2011-06-05 MED ORDER — OXYCODONE-ACETAMINOPHEN 5-325 MG PO TABS
1.0000 | ORAL_TABLET | ORAL | Status: DC | PRN
  Administered 2011-06-05: 1 via ORAL

## 2011-06-05 MED ORDER — LEVOFLOXACIN 500 MG PO TABS
500.0000 mg | ORAL_TABLET | ORAL | Status: AC
  Administered 2011-06-05: 500 mg via ORAL
  Filled 2011-06-05: qty 1

## 2011-06-05 NOTE — H&P (Signed)
  Date of Initial H&P: 05/07/2011  History reviewed, patient examined, no change in status, stable for surgery.

## 2011-06-05 NOTE — H&P (Signed)
Urology Admission H&P  Chief Complaint: Left sided kidney st  History of Present Illness:    This man is here today for l ESLeft He does have a history of renal cell carcinoma, and had a right radical nephrectomy for a large right renal tumor in May of 2001 by Dr. Javier Glazier. He saw Marcello Fennel in our officelrecenfor this hematuria. Hematuria CT was performed. This showed a small left upper pole renal cyst, a 10 mm left lower pole renal calculus, but no other abnormalities. He has had intermittent hematuria continuously. He does have left flank and back pain with some anterior radiation.   Past Medical History  Diagnosis Date  . Difficult intubation   . Chronic kidney disease   . Hypertension   . Arthritis   . Anxiety   . Hiatal hernia   . Heart murmur     no symptoms  . Cancer of kidney     right renal carcinoma (nephrectomy )   Past Surgical History  Procedure Date  . Right nephrectomy 2001  . C4-5  surgery 2004    Home Medications:  Prescriptions prior to admission  Medication Sig Dispense Refill  . AMLODIPINE BESYLATE PO Take 10 mg by mouth every morning.       Marland Kitchen atorvastatin (LIPITOR) 10 MG tablet Take 10 mg by mouth every morning.       Marland Kitchen HYDROcodone-acetaminophen (VICODIN ES) 7.5-750 MG per tablet Take 1 tablet by mouth every 8 (eight) hours as needed. PAIN       . oxyCODONE-acetaminophen (PERCOCET) 5-325 MG per tablet Take 1 tablet by mouth every 4 (four) hours as needed. pain       . Multiple Vitamins-Minerals (MULTIVITAMINS THER. W/MINERALS) TABS Take 1 tablet by mouth daily.         Allergies: No Known Allergies  History reviewed. No pertinent family history. Social History:  reports that he has never smoked. He does not have any smokeless tobacco history on file. He reports that he does not drink alcohol or use illicit drugs.  Review of Systems  Constitutional: Negative.   HENT: Negative.   Eyes: Negative.   Gastrointestinal: Positive for abdominal pain.    Genitourinary: Positive for hematuria.  Skin: Negative.   All other systems reviewed and are negative.    Physical Exam:  Vital signs in last 24 hours: Temp:  [97.5 F (36.4 C)] 97.5 F (36.4 C) (11/15 0540) Pulse Rate:  [96] 96  (11/15 0540) Resp:  [18] 18  (11/15 0540) BP: (171)/(98) 171/98 mmHg (11/15 0540) SpO2:  [96 %] 96 % (11/15 0540) Weight:  [124.739 kg (275 lb)] 275 lb (124.739 kg) (11/15 0540) Physical Exam  Constitutional: He is oriented to person, place, and time. He appears well-developed and well-nourished.       Obese  HENT:  Head: Normocephalic and atraumatic.  Eyes: EOM are normal. Pupils are equal, round, and reactive to light.  Neck: Normal range of motion.  Cardiovascular: Normal rate and regular rhythm.   Respiratory: Effort normal and breath sounds normal.  GI: Soft. Bowel sounds are normal.  Genitourinary:       Not examined  Musculoskeletal: Normal range of motion.  Neurological: He is alert and oriented to person, place, and time.  Skin: Skin is warm and dry.  Psychiatric: He has a normal mood and affect. His behavior is normal.    Laboratory Data:  No results found for this or any previous visit (from the past 24 hour(s)). No  results found for this or any previous visit (from the past 240 hour(s)). Creatinine: No results found for this basename: CREATININE:7 in the last 168 hours Baseline Creatinine:  Impression/Assessment:  Symptomatic left renal calculus  Plan:  Left ESWL  Marcine Matar M 06/05/2011, 7:48 AM

## 2011-06-05 NOTE — Progress Notes (Signed)
No vomiting sincce Zofran given

## 2012-01-16 ENCOUNTER — Observation Stay (HOSPITAL_COMMUNITY): Payer: 59

## 2012-01-16 ENCOUNTER — Other Ambulatory Visit: Payer: Self-pay | Admitting: Urology

## 2012-01-16 ENCOUNTER — Observation Stay (HOSPITAL_COMMUNITY)
Admission: AD | Admit: 2012-01-16 | Discharge: 2012-01-18 | Disposition: A | Discharge status: Home / Self Care | Payer: 59 | Source: Ambulatory Visit | Attending: Urology | Admitting: Urology

## 2012-01-16 ENCOUNTER — Encounter (HOSPITAL_COMMUNITY): Payer: Self-pay | Admitting: *Deleted

## 2012-01-16 DIAGNOSIS — N189 Chronic kidney disease, unspecified: Secondary | ICD-10-CM | POA: Insufficient documentation

## 2012-01-16 DIAGNOSIS — I129 Hypertensive chronic kidney disease with stage 1 through stage 4 chronic kidney disease, or unspecified chronic kidney disease: Secondary | ICD-10-CM | POA: Insufficient documentation

## 2012-01-16 DIAGNOSIS — N200 Calculus of kidney: Secondary | ICD-10-CM | POA: Insufficient documentation

## 2012-01-16 DIAGNOSIS — Z79899 Other long term (current) drug therapy: Secondary | ICD-10-CM | POA: Insufficient documentation

## 2012-01-16 DIAGNOSIS — N201 Calculus of ureter: Principal | ICD-10-CM | POA: Insufficient documentation

## 2012-01-16 DIAGNOSIS — Z01810 Encounter for preprocedural cardiovascular examination: Secondary | ICD-10-CM | POA: Insufficient documentation

## 2012-01-16 DIAGNOSIS — Z85528 Personal history of other malignant neoplasm of kidney: Secondary | ICD-10-CM | POA: Insufficient documentation

## 2012-01-16 LAB — CBC
HCT: 41.7 % (ref 39.0–52.0)
Hemoglobin: 14.1 g/dL (ref 13.0–17.0)
MCH: 29.6 pg (ref 26.0–34.0)
MCHC: 33.8 g/dL (ref 30.0–36.0)
MCV: 87.6 fL (ref 78.0–100.0)
Platelets: 185 K/uL (ref 150–400)
RBC: 4.76 MIL/uL (ref 4.22–5.81)
RDW: 14.2 % (ref 11.5–15.5)
WBC: 8.7 K/uL (ref 4.0–10.5)

## 2012-01-16 LAB — BASIC METABOLIC PANEL WITH GFR
BUN: 15 mg/dL (ref 6–23)
CO2: 28 meq/L (ref 19–32)
Calcium: 8.9 mg/dL (ref 8.4–10.5)
Chloride: 100 meq/L (ref 96–112)
Creatinine, Ser: 1.48 mg/dL — ABNORMAL HIGH (ref 0.50–1.35)
GFR calc Af Amer: 63 mL/min — ABNORMAL LOW
GFR calc non Af Amer: 54 mL/min — ABNORMAL LOW
Glucose, Bld: 94 mg/dL (ref 70–99)
Potassium: 3.2 meq/L — ABNORMAL LOW (ref 3.5–5.1)
Sodium: 137 meq/L (ref 135–145)

## 2012-01-16 LAB — DIFFERENTIAL
Basophils Absolute: 0.1 10*3/uL (ref 0.0–0.1)
Basophils Relative: 1 % (ref 0–1)
Eosinophils Relative: 4 % (ref 0–5)
Monocytes Absolute: 0.7 10*3/uL (ref 0.1–1.0)
Monocytes Relative: 8 % (ref 3–12)

## 2012-01-16 MED ORDER — CIPROFLOXACIN IN D5W 400 MG/200ML IV SOLN
400.0000 mg | Freq: Two times a day (BID) | INTRAVENOUS | Status: DC
  Administered 2012-01-17 – 2012-01-18 (×3): 400 mg via INTRAVENOUS
  Filled 2012-01-16 (×5): qty 200

## 2012-01-16 MED ORDER — ONDANSETRON HCL 4 MG/2ML IJ SOLN
4.0000 mg | INTRAMUSCULAR | Status: DC | PRN
  Administered 2012-01-17 (×4): 4 mg via INTRAVENOUS
  Filled 2012-01-16: qty 2

## 2012-01-16 MED ORDER — DIPHENHYDRAMINE HCL 12.5 MG/5ML PO ELIX: 12.5000 mg | ORAL_SOLUTION | Freq: Four times a day (QID) | ORAL | Status: DC | PRN

## 2012-01-16 MED ORDER — DIPHENHYDRAMINE HCL 50 MG/ML IJ SOLN: 12.5000 mg | Freq: Four times a day (QID) | INTRAMUSCULAR | Status: DC | PRN

## 2012-01-16 MED ORDER — NALOXONE HCL 0.4 MG/ML IJ SOLN: 0.4000 mg | INTRAMUSCULAR | Status: DC | PRN

## 2012-01-16 MED ORDER — ONDANSETRON HCL 4 MG/2ML IJ SOLN
4.0000 mg | Freq: Four times a day (QID) | INTRAMUSCULAR | Status: DC | PRN
  Filled 2012-01-16 (×3): qty 2

## 2012-01-16 MED ORDER — DEXTROSE-NACL 5-0.45 % IV SOLN
INTRAVENOUS | Status: DC
  Administered 2012-01-16 – 2012-01-17 (×2): via INTRAVENOUS

## 2012-01-16 MED ORDER — HYDROMORPHONE 0.3 MG/ML IV SOLN: INTRAVENOUS | Status: DC

## 2012-01-16 MED ORDER — HYDROMORPHONE 0.3 MG/ML IV SOLN
INTRAVENOUS | Status: DC
  Administered 2012-01-16: 20:00:00 via INTRAVENOUS
  Administered 2012-01-17: 2.67 mg via INTRAVENOUS
  Administered 2012-01-17: 4.8 mg via INTRAVENOUS
  Administered 2012-01-17: 2 mg via INTRAVENOUS
  Administered 2012-01-17: 0.6 mg via INTRAVENOUS
  Administered 2012-01-17: 0.3 mg via INTRAVENOUS
  Administered 2012-01-17 (×2): via INTRAVENOUS
  Administered 2012-01-18: 1.5 mg via INTRAVENOUS
  Administered 2012-01-18: 0.3 mg via INTRAVENOUS
  Administered 2012-01-18: 1.2 mg via INTRAVENOUS
  Administered 2012-01-18: 06:00:00 via INTRAVENOUS
  Administered 2012-01-18: 1.29 mg via INTRAVENOUS
  Filled 2012-01-16 (×4): qty 25

## 2012-01-16 MED ORDER — DOCUSATE SODIUM 100 MG PO CAPS
100.0000 mg | ORAL_CAPSULE | Freq: Two times a day (BID) | ORAL | Status: DC
  Administered 2012-01-16 – 2012-01-18 (×3): 100 mg via ORAL
  Filled 2012-01-16 (×5): qty 1

## 2012-01-16 MED ORDER — ONDANSETRON HCL 4 MG/2ML IJ SOLN: 4.0000 mg | Freq: Four times a day (QID) | INTRAMUSCULAR | Status: DC | PRN

## 2012-01-16 MED ORDER — SODIUM CHLORIDE 0.9 % IJ SOLN: 9.0000 mL | INTRAMUSCULAR | Status: DC | PRN

## 2012-01-16 MED ORDER — ZOLPIDEM TARTRATE 5 MG PO TABS: 5.0000 mg | ORAL_TABLET | Freq: Every evening | ORAL | Status: DC | PRN

## 2012-01-16 NOTE — H&P (Addendum)
Urology History and Physical Exam  CC: Kidney stone  HPI: 46 year old male presented to the office [facility] specialist today with new onset gross hematuriaand left flank pain. He has a known left renal calculus, and underwent lithotripsy which was only partially successful in late 2012. He has a solitary kidney, with his right kidney being removed in the past for renal cell carcinoma. In the office, his stone which was present in the lower pole of the left kidney in late 2012 is now in the area of the left UPJ.  Because of the patient's pain, a solitary kidney and his probable obstruction of this solitary kidney, he is admitted for pain management and urgent left double-J stent placement.  PMH: Past Medical History  Diagnosis Date  . Difficult intubation   . Chronic kidney disease   . Hypertension   . Arthritis   . Anxiety   . Hiatal hernia   . Heart murmur     no symptoms  . Cancer of kidney     right renal carcinoma (nephrectomy )    PSH: Past Surgical History  Procedure Date  . Right nephrectomy 2001  . C4-5  surgery 2004    Allergies: No Known Allergies  Medications: Prescriptions prior to admission  Medication Sig Dispense Refill  . AMLODIPINE BESYLATE PO Take 10 mg by mouth every morning.       Marland Kitchen atorvastatin (LIPITOR) 10 MG tablet Take 10 mg by mouth every morning.       . Multiple Vitamins-Minerals (MULTIVITAMINS THER. W/MINERALS) TABS Take 1 tablet by mouth daily.        Marland Kitchen oxyCODONE-acetaminophen (PERCOCET) 5-325 MG per tablet Take 1 tablet by mouth every 4 (four) hours as needed. pain          Social History: History   Social History  . Marital Status: Married    Spouse Name: N/A    Number of Children: N/A  . Years of Education: N/A   Occupational History  . Not on file.   Social History Main Topics  . Smoking status: Never Smoker   . Smokeless tobacco: Not on file  . Alcohol Use: No  . Drug Use: No  . Sexually Active: Not on file    Other Topics Concern  . Not on file   Social History Narrative  . No narrative on file    Family History: No family history on file.  Review of Systems: Positive: hematuria, left flank pain, mild nausea Negative:    A further 10 point review of systems was negative except what is listed in the HPI.  Physical Exam: @VITALS2 @ General: Pt uncomfortable.  Awake. Head:  Normocephalic.  Atraumatic. ENT:  EOMI.  Mucous membranes moist Neck:  Supple.  No lymphadenopathy. CV:  S1 present. S2 present. Regular rate. Pulmonary: Equal effort bilaterally.  Clear to auscultation bilaterally. Abdomen: Soft. Obese, left CVA tenderness Skin:  Normal turgor.  No visible rash. Extremity: No gross deformity of bilateral upper extremities.  No gross deformity of bilateral lower extremities. Neurologic: Alert. Appropriate mood.    Studies: KUB in the office revealed a10 mm calcification in the area of the left renal pelvis/UPJ No results found for this basename: HGB:2,WBC:2,PLT:2 in the last 72 hours  No results found for this basename: NA:2,K:2,CL:2,CO2:2,BUN:2,CREATININE:2,CALCIUM:2,MAGNESIUM:2,GFRNONAA:2,GFRAA:2 in the last 72 hours   No results found for this basename: PT:2,INR:2,APTT:2 in the last 72 hours   No components found with this basename: ABG:2    Assessment:  10  mm left UPJ/renal pelvic stone in a patient with a solitary kidney and significant pain.  Plan: 1. The patient will be admitted for IV hydration and pain management  2. As the patient has had food and fluid within the past hour, I do not feel that there is an emergent indication give him a general anesthetic now. However, I think he needs to be scheduled in the morning for cystoscopy and left double-J stent  3. I have recommended that he have his stone treated eventually with ureteroscopy and holmium laser lithotripsy. Unfortunately, extracorporeal shockwave lithotripsy is difficult in this man due to significant  excursion of his kidney with sedation.  4. I have discussed with the patient that Dr. Brunilda Payor, who is on call this weekend, we'll be performing the procedure in the morning.   Date of Initial H&P: 01/16/12  History reviewed, patient re-examined, no change in status, stable for surgery.

## 2012-01-17 ENCOUNTER — Observation Stay (HOSPITAL_COMMUNITY): Payer: 59 | Admitting: Anesthesiology

## 2012-01-17 ENCOUNTER — Inpatient Hospital Stay: Admit: 2012-01-17 | Payer: Self-pay | Admitting: Urology

## 2012-01-17 ENCOUNTER — Encounter (HOSPITAL_COMMUNITY): Payer: Self-pay | Admitting: Anesthesiology

## 2012-01-17 ENCOUNTER — Encounter (HOSPITAL_COMMUNITY)
Admission: AD | Disposition: A | Discharge status: Home / Self Care | Payer: Self-pay | Source: Ambulatory Visit | Attending: Urology

## 2012-01-17 HISTORY — PX: CYSTOSCOPY W/ URETERAL STENT PLACEMENT: SHX1429

## 2012-01-17 SURGERY — CYSTOSCOPY, WITH RETROGRADE PYELOGRAM AND URETERAL STENT INSERTION
Anesthesia: General | Laterality: Left | Wound class: Clean Contaminated

## 2012-01-17 MED ORDER — ONDANSETRON HCL 4 MG/2ML IJ SOLN
INTRAMUSCULAR | Status: DC | PRN
  Administered 2012-01-17: 2 mg via INTRAVENOUS

## 2012-01-17 MED ORDER — SODIUM CHLORIDE 0.9 % IV SOLN
INTRAVENOUS | Status: DC | PRN
  Administered 2012-01-17: 09:00:00 via INTRAVENOUS

## 2012-01-17 MED ORDER — SUCCINYLCHOLINE CHLORIDE 20 MG/ML IJ SOLN
INTRAMUSCULAR | Status: DC | PRN
  Administered 2012-01-17: 160 mg via INTRAVENOUS

## 2012-01-17 MED ORDER — LIDOCAINE HCL (CARDIAC) 20 MG/ML IV SOLN
INTRAVENOUS | Status: DC | PRN
  Administered 2012-01-17: 30 mg via INTRAVENOUS

## 2012-01-17 MED ORDER — PROPOFOL 10 MG/ML IV EMUL
INTRAVENOUS | Status: DC | PRN
  Administered 2012-01-17: 200 mg via INTRAVENOUS

## 2012-01-17 MED ORDER — ALPRAZOLAM 0.5 MG PO TABS
0.5000 mg | ORAL_TABLET | Freq: Two times a day (BID) | ORAL | Status: DC | PRN
  Administered 2012-01-17: 0.5 mg via ORAL
  Filled 2012-01-17: qty 1

## 2012-01-17 MED ORDER — BUPROPION HCL ER (XL) 150 MG PO TB24
150.0000 mg | ORAL_TABLET | Freq: Every day | ORAL | Status: DC
  Administered 2012-01-17 – 2012-01-18 (×2): 150 mg via ORAL
  Filled 2012-01-17 (×2): qty 1

## 2012-01-17 MED ORDER — AMLODIPINE BESYLATE 10 MG PO TABS
10.0000 mg | ORAL_TABLET | Freq: Every day | ORAL | Status: DC
  Administered 2012-01-17 – 2012-01-18 (×2): 10 mg via ORAL
  Filled 2012-01-17 (×2): qty 1

## 2012-01-17 MED ORDER — METOPROLOL TARTRATE 1 MG/ML IV SOLN
2.0000 mg | INTRAVENOUS | Status: AC | PRN
  Administered 2012-01-17: 2 mg via INTRAVENOUS

## 2012-01-17 MED ORDER — ALPRAZOLAM 0.5 MG PO TABS
0.5000 mg | ORAL_TABLET | Freq: Two times a day (BID) | ORAL | Status: DC | PRN
Start: 2012-01-17 — End: 2012-01-18

## 2012-01-17 MED ORDER — BISACODYL 10 MG RE SUPP
10.0000 mg | Freq: Once | RECTAL | Status: AC
  Administered 2012-01-17: 10 mg via RECTAL
  Filled 2012-01-17: qty 1

## 2012-01-17 MED ORDER — PROMETHAZINE HCL 25 MG/ML IJ SOLN: 6.2500 mg | INTRAMUSCULAR | Status: DC | PRN

## 2012-01-17 MED ORDER — IOHEXOL 300 MG/ML  SOLN
INTRAMUSCULAR | Status: DC | PRN
  Administered 2012-01-17: 10 mL

## 2012-01-17 MED ORDER — HYDROCODONE-ACETAMINOPHEN 10-325 MG PO TABS: 1.0000 | ORAL_TABLET | Freq: Three times a day (TID) | ORAL | Status: DC | PRN

## 2012-01-17 MED ORDER — FENTANYL CITRATE 0.05 MG/ML IJ SOLN
INTRAMUSCULAR | Status: DC | PRN
  Administered 2012-01-17: 50 ug via INTRAVENOUS

## 2012-01-17 MED ORDER — THERA M PLUS PO TABS: 1.0000 | ORAL_TABLET | Freq: Every day | ORAL | Status: DC

## 2012-01-17 MED ORDER — INDIGOTINDISULFONATE SODIUM 8 MG/ML IJ SOLN
INTRAMUSCULAR | Status: DC | PRN
  Administered 2012-01-17: 5 mL via INTRAVENOUS

## 2012-01-17 MED ORDER — ATORVASTATIN CALCIUM 10 MG PO TABS
10.0000 mg | ORAL_TABLET | Freq: Every day | ORAL | Status: DC
  Administered 2012-01-17: 10 mg via ORAL
  Filled 2012-01-17 (×2): qty 1

## 2012-01-17 MED ORDER — SODIUM CHLORIDE 0.9 % IR SOLN
Status: DC | PRN
  Administered 2012-01-17: 3000 mL

## 2012-01-17 MED ORDER — BISACODYL 10 MG RE SUPP: 10.0000 mg | Freq: Once | RECTAL | Status: DC

## 2012-01-17 MED ORDER — MIDAZOLAM HCL 5 MG/5ML IJ SOLN
INTRAMUSCULAR | Status: DC | PRN
  Administered 2012-01-17: 1 mg via INTRAVENOUS

## 2012-01-17 MED ORDER — METHOCARBAMOL 500 MG PO TABS: 750.0000 mg | ORAL_TABLET | Freq: Three times a day (TID) | ORAL | Status: DC | PRN

## 2012-01-17 MED ORDER — FENTANYL CITRATE 0.05 MG/ML IJ SOLN
25.0000 ug | INTRAMUSCULAR | Status: DC | PRN
  Administered 2012-01-17: 25 ug via INTRAVENOUS

## 2012-01-17 MED ORDER — ADULT MULTIVITAMIN W/MINERALS CH
1.0000 | ORAL_TABLET | Freq: Every day | ORAL | Status: DC
  Administered 2012-01-18: 1 via ORAL
  Filled 2012-01-17 (×2): qty 1

## 2012-01-17 SURGICAL SUPPLY — 20 items
ADAPTER CATH URET PLST 4-6FR (CATHETERS) ×2 IMPLANT
ADPR CATH URET STRL DISP 4-6FR (CATHETERS) ×1
BAG URO CATCHER STRL LF (DRAPE) ×2 IMPLANT
CATH INTERMIT  6FR 70CM (CATHETERS) IMPLANT
CATH URET 5FR 28IN CONE TIP (BALLOONS) ×1
CATH URET 5FR 28IN OPEN ENDED (CATHETERS) ×1 IMPLANT
CATH URET 5FR 70CM CONE TIP (BALLOONS) IMPLANT
CLOTH BEACON ORANGE TIMEOUT ST (SAFETY) ×2 IMPLANT
DRAPE CAMERA CLOSED 9X96 (DRAPES) ×2 IMPLANT
GLIDEWIRE 0.025 SS STRAIGHT (WIRE) ×1 IMPLANT
GLOVE BIOGEL M 7.0 STRL (GLOVE) ×2 IMPLANT
GOWN STRL NON-REIN LRG LVL3 (GOWN DISPOSABLE) ×3 IMPLANT
GUIDEWIRE STR DUAL SENSOR (WIRE) ×1 IMPLANT
MANIFOLD NEPTUNE II (INSTRUMENTS) ×2 IMPLANT
MARKER SKIN DUAL TIP RULER LAB (MISCELLANEOUS) ×1 IMPLANT
NS IRRIG 1000ML POUR BTL (IV SOLUTION) ×2 IMPLANT
PACK CYSTO (CUSTOM PROCEDURE TRAY) ×2 IMPLANT
STENT CONTOUR 6FRX26X.038 (STENTS) ×1 IMPLANT
TUBING CONNECTING 10 (TUBING) ×2 IMPLANT
WIRE COONS/BENSON .038X145CM (WIRE) ×1 IMPLANT

## 2012-01-17 NOTE — Anesthesia Preprocedure Evaluation (Signed)
Anesthesia Evaluation  Patient identified by MRN, date of birth, ID band Patient awake    Reviewed: Allergy & Precautions, H&P , NPO status , Patient's Chart, lab work & pertinent test results  History of Anesthesia Complications (+) DIFFICULT AIRWAY  Airway Mallampati: II TM Distance: <3 FB Neck ROM: Limited    Dental No notable dental hx.    Pulmonary neg pulmonary ROS,  breath sounds clear to auscultation  + decreased breath sounds      Cardiovascular hypertension, Pt. on medications Rhythm:Regular Rate:Normal     Neuro/Psych negative neurological ROS  negative psych ROS   GI/Hepatic Neg liver ROS, hiatal hernia,   Endo/Other  Morbid obesity  Renal/GU Renal disease  negative genitourinary   Musculoskeletal negative musculoskeletal ROS (+)   Abdominal (+) + obese,   Peds negative pediatric ROS (+)  Hematology negative hematology ROS (+)   Anesthesia Other Findings   Reproductive/Obstetrics negative OB ROS                           Anesthesia Physical Anesthesia Plan  ASA: III  Anesthesia Plan: General   Post-op Pain Management:    Induction: Intravenous  Airway Management Planned: Oral ETT and Video Laryngoscope Planned  Additional Equipment:   Intra-op Plan:   Post-operative Plan: Extubation in OR  Informed Consent: I have reviewed the patients History and Physical, chart, labs and discussed the procedure including the risks, benefits and alternatives for the proposed anesthesia with the patient or authorized representative who has indicated his/her understanding and acceptance.   Dental advisory given  Plan Discussed with: CRNA  Anesthesia Plan Comments:         Anesthesia Quick Evaluation

## 2012-01-17 NOTE — Op Note (Signed)
Jake Brown is a 46 y.o.   01/17/2012  Pre-op diagnosis: Left UPJ calculus in a solitary kidney  Postop diagnosis: Same  Procedure done: Cystoscopy, left retrograde pyelogram, insertion of double-J stent  Surgeon: Wendie Simmer. Aryah Doering  Anesthesia: General  Indication: Patient is a 46 years old male with a solitary left kidney who presented to the office yesterday afternoon with gross hematuria and left flank pain. He is known to have a stone in the left kidney that was in the lower pole. KUB at the office showed that the stone is at the UPJ. He is scheduled today for cystoscopy and insertion of double-J stent.  Procedure: The patient was identified by his wrist band and proper timeout was taken.  Under general anesthesia he was prepped and draped and placed in the dorsolithotomy position. A panendoscope was inserted in the bladder. The anterior urethra is normal; the prostatic urethra is normal. There is no stone or tumor in the bladder. It was difficult to identify the left ureteral orifice. 1 ampule of indigo carmine was injected intravenously. The ureteral orifice was then identified.  Retrograde pyelogram:  A cone-tip catheter was passed through the cystoscope and the left ureteral orifice. 5 cc of Omnipaque were injected through the cone-tip catheter. There is a J hook deformity of the distal ureter and contrast would not go beyond the distal ureter. The cone-tip catheter was then removed.  A glide wire was then passed through a #5 [place] open-ended ureteral catheter and with with difficulty I was able to pass the Glidewire in the renal pelvis. I was not able to advance the open-ended catheter over the glide wire. I removed the open-ended catheter and passed a #6 French-26 double-J stent over the glide wire. There is a large stone at the UPJ. The  double-J stent was passed proximal to the stone. The proximal curl of the double-J stent is in the collecting system. The Glidewire was then  removed. The distal curl of the double-J stent is in the bladder. The bladder was then emptied and the cystoscope was removed.  The patient tolerated the procedure well and left the OR in satisfactory condition to postanesthesia care unit.

## 2012-01-17 NOTE — Anesthesia Postprocedure Evaluation (Signed)
  Anesthesia Post-op Note  Patient: Jake Brown  Procedure(s) Performed: Procedure(s) (LRB): CYSTOSCOPY WITH RETROGRADE PYELOGRAM/URETERAL STENT PLACEMENT (Left)  Patient Location: PACU  Anesthesia Type: General  Level of Consciousness: awake, alert , oriented and patient cooperative  Airway and Oxygen Therapy: Patient Spontanous Breathing and Patient connected to face mask oxygen  Post-op Pain:  Post-op Assessment: Post-op Vital signs reviewed  Post-op Vital Signs: Reviewed and stable  Complications: No apparent anesthesia complications

## 2012-01-17 NOTE — Anesthesia Postprocedure Evaluation (Signed)
  Anesthesia Post-op Note  Patient: Jake Brown  Procedure(s) Performed: Procedure(s) (LRB): CYSTOSCOPY WITH RETROGRADE PYELOGRAM/URETERAL STENT PLACEMENT (Left)  Patient Location: PACU  Anesthesia Type: General  Level of Consciousness: awake and alert   Airway and Oxygen Therapy: Patient Spontanous Breathing  Post-op Pain: mild  Post-op Assessment: Post-op Vital signs reviewed, Patient's Cardiovascular Status Stable, Respiratory Function Stable, Patent Airway and No signs of Nausea or vomiting  Post-op Vital Signs: stable  Complications: No apparent anesthesia complications

## 2012-01-17 NOTE — Transfer of Care (Signed)
Immediate Anesthesia Transfer of Care Note  Patient: Jake Brown  Procedure(s) Performed: Procedure(s) (LRB): CYSTOSCOPY WITH RETROGRADE PYELOGRAM/URETERAL STENT PLACEMENT (Left)  Patient Location: PACU  Anesthesia Type: General  Level of Consciousness: awake and oriented  Airway & Oxygen Therapy: Patient Spontanous Breathing and Patient connected to face mask oxygen  Post-op Assessment: Report given to PACU RN and Post -op Vital signs reviewed and stable  Post vital signs: Reviewed and stable  Complications: No apparent anesthesia complications

## 2012-01-18 MED ORDER — CIPROFLOXACIN HCL 250 MG PO TABS: 250.0000 mg | ORAL_TABLET | Freq: Every day | ORAL | Status: AC

## 2012-01-18 NOTE — Progress Notes (Signed)
Patient discharged home with wife. Discharge instructions given and explained to patient and he verbalized understanding, denies any distress. Accompanied home by wife and transported to the car via wheelchair by staff. Skin intact, no wound.

## 2012-01-18 NOTE — Progress Notes (Signed)
Patient wanted to start regular diet, encouraged patient to advance to full liquid diet first then if tolerated well can advance to solid food, but patient stated he wants to start  solid food this AM.

## 2012-01-18 NOTE — Discharge Summary (Signed)
Patient ID: JEREME LOREN MRN: [id number] DOB/AGE: Jul 29, 1965 46 y.o.  Admit date: 01/16/2012 Discharge date: 01/18/2012  Admission Diagnoses: Left upj stone  left upj stone  Discharge Diagnoses:  Active Problems:  * No active hospital problems. *    Discharged Condition:Improved  Hospital Course: Patient had cystoscopy, left retrograde pyelogram and JJ stent for an obstructing 10 mm stone at the UPJ of a solitary left kidney.  He is doing well today.  He does not have any more pain.  He voids well.  His urine is grossly clear.  He tolerates his diet well and has regular bowel movements.    Significant Diagnostic Studies: X-ray Chest Pa And Lateral   01/16/2012  *RADIOLOGY REPORT*  Clinical Data: Preop radiograph.  CHEST - 2 VIEW  Comparison: 10/09/2010  Findings: Heart size is normal.  No pleural effusion or pulmonary edema.  There is no airspace consolidation identified.  Chronic left posterior rib fracture deformities are again noted and appears similar to previous exam.  IMPRESSION:  1.  No acute cardiopulmonary abnormalities. 2.  Chronic left  posterior rib fractures.  Original Report Authenticated By: Rosealee Albee, M.D.    Treatments: Cystoscopy, left retrograde pyelogram, insertion of JJ stent  Discharge Exam: Blood pressure 117/77, pulse 85, temperature 98.3 F (36.8 C), temperature source Oral, resp. rate 11, height 5\' 9"  (1.753 m), weight 314 lb 4.8 oz (142.566 kg), SpO2 96.00%.   Disposition: 01-Home or Self Care   Medication List  As of 01/18/2012 10:29 AM   TAKE these medications         ALPRAZolam 0.5 MG tablet   Commonly known as: XANAX   Take 0.5 mg by mouth 2 (two) times daily as needed. anxiety      amLODipine 5 MG tablet   Commonly known as: NORVASC   Take 10 mg by mouth daily.      atorvastatin 10 MG tablet   Commonly known as: LIPITOR   Take 10 mg by mouth every morning.      buPROPion 150 MG 24 hr tablet   Commonly known as: WELLBUTRIN  XL   Take 150 mg by mouth daily.      ciprofloxacin 250 MG tablet   Commonly known as: CIPRO   Take 1 tablet (250 mg total) by mouth daily.      HYDROcodone-acetaminophen 10-325 MG per tablet   Commonly known as: NORCO   Take 1 tablet by mouth every 8 (eight) hours as needed. pain      methocarbamol 750 MG tablet   Commonly known as: ROBAXIN   Take 750 mg by mouth 3 (three) times daily as needed. Muscle spasms      multivitamins ther. w/minerals Tabs   Take 1 tablet by mouth daily.             Signed: Niall Illes-HENRY 01/18/2012, 10:29 AM

## 2012-01-19 ENCOUNTER — Encounter (HOSPITAL_COMMUNITY): Payer: Self-pay | Admitting: Urology

## 2012-01-20 ENCOUNTER — Other Ambulatory Visit: Payer: Self-pay | Admitting: Urology

## 2012-01-20 ENCOUNTER — Encounter (HOSPITAL_BASED_OUTPATIENT_CLINIC_OR_DEPARTMENT_OTHER): Payer: Self-pay | Admitting: *Deleted

## 2012-01-20 NOTE — Progress Notes (Signed)
REVIEWED CHART W/ DR SCHUSTER MDA, NOTING ANES. NOTE FROM 01-17-2012 FOR DIFFICULT AIRWAY.  STATES OK TO PROCEED SINCE LMA WOULD BE USED AND WILL INFORM MDA DR DENNENY WHOM IS MDA DOS.

## 2012-01-21 ENCOUNTER — Encounter (HOSPITAL_BASED_OUTPATIENT_CLINIC_OR_DEPARTMENT_OTHER): Payer: Self-pay | Admitting: *Deleted

## 2012-01-21 NOTE — Progress Notes (Signed)
To [facility] at 1100,Istat on arrival,Ekg in Epic-Npo after MN-will take amlodipine with sip water that am-may take xanax,pain med also if needed.

## 2012-01-28 NOTE — H&P (Signed)
Urology History and Physical Exam  CC: kidney stone  HPI: 46 year old male presents for left ureteroscopic stone extraction.  This man has a history of nephrolithiasis on the left, as well as a right radical nephrectomy for renal cell carcinoma several years ago. He presented on 01/16/2012 with left flank pain and an obvious stone in his left renal pelvic/UPJ area. He did not tolerate lithitripsy well in the past due to sleep apnea as well as significant renal excursion. He underwent emergent stent placement and comes in now for definitive ureteroscopic treatment  PMH: Past Medical History  Diagnosis Date  . Chronic kidney disease   . Hypertension   . Arthritis   . Anxiety   . Cancer of kidney     right renal carcinoma (nephrectomy )  . PONV (postoperative nausea and vomiting)   . Difficult intubation     no problems 01/17/12-stated "small esophagus"  . Heart murmur 1983 noted    no symptoms    PSH: Past Surgical History  Procedure Date  . C4-5  surgery 2004  . Cystoscopy w/ ureteral stent placement 01/17/2012    Procedure: CYSTOSCOPY WITH RETROGRADE PYELOGRAM/URETERAL STENT PLACEMENT;  Surgeon: Lindaann Slough, MD;  Location: [facility];  Service: Urology;  Laterality: Left;  . Laparoscpic ventral hernia repair with mesh and extensive lysis adhesions 11-08-2010    RIGHT SUBCOSTAL VENTRAL INCISIONAL HERNIA  S/P RIGHT RADIAL  NEPHRECTOMY  . Laminectomy and microdiscectomy cervical spine 09-16-2006    RIGHT SIDE,  C6 - 7  . Transabdominal right radical nephrectomy 12-19-1999    LARGE RIGHT RENAL CELL CARCINOMA  . Ureteral reimplantion CHILD    AND REMOVAL HUTCH DIVERTICULUM  . Orif femur fx     Allergies: No Known Allergies  Medications: No prescriptions prior to admission     Social History: History   Social History  . Marital Status: Married    Spouse Name: N/A    Number of Children: N/A  . Years of Education: N/A   Occupational History  . Not on file.   Social  History Main Topics  . Smoking status: Former Smoker -- 20 years    Quit date: 07/21/1993  . Smokeless tobacco: Not on file  . Alcohol Use: No  . Drug Use: No  . Sexually Active: Yes   Other Topics Concern  . Not on file   Social History Narrative  . No narrative on file    Family History: History reviewed. No pertinent family history.  Review of Systems: Positive: flank pain,hematuria, frequency/urgency Negative: .  A further 10 point review of systems was negative except what is listed in the HPI.  Physical Exam: @VITALS2 @ General: No acute distress.  Awake. Obese Head:  Normocephalic.  Atraumatic. ENT:  EOMI.  Mucous membranes moist Neck:  Supple.  No lymphadenopathy. CV:  S1 present. S2 present. Regular rate. Pulmonary: Equal effort bilaterally.  Clear to auscultation bilaterally. Abdomen: Soft.  Non tender to palpation. Skin:  Normal turgor.  No visible rash. Extremity: No gross deformity of bilateral upper extremities.  No gross deformity of    bilateral lower extremities. Neurologic: Alert. Appropriate mood.   Studies:  No results found for this basename: HGB:2,WBC:2,PLT:2 in the last 72 hours  No results found for this basename: NA:2,K:2,CL:2,CO2:2,BUN:2,CREATININE:2,CALCIUM:2,MAGNESIUM:2,GFRNONAA:2,GFRAA:2 in the last 72 hours   No results found for this basename: PT:2,INR:2,APTT:2 in the last 72 hours   No components found with this basename: ABG:2    Assessment:  Left renal pelvic stone,  s/p stenting  Plan: Left ureteroscopic stone treatment

## 2012-01-29 ENCOUNTER — Encounter (HOSPITAL_BASED_OUTPATIENT_CLINIC_OR_DEPARTMENT_OTHER): Payer: Self-pay | Admitting: Anesthesiology

## 2012-01-29 ENCOUNTER — Ambulatory Visit (HOSPITAL_BASED_OUTPATIENT_CLINIC_OR_DEPARTMENT_OTHER): Payer: 59 | Admitting: Anesthesiology

## 2012-01-29 ENCOUNTER — Ambulatory Visit (HOSPITAL_BASED_OUTPATIENT_CLINIC_OR_DEPARTMENT_OTHER)
Admission: RE | Admit: 2012-01-29 | Discharge: 2012-01-29 | Disposition: A | Discharge status: Home / Self Care | Payer: 59 | Source: Ambulatory Visit | Attending: Urology | Admitting: Urology

## 2012-01-29 ENCOUNTER — Encounter (HOSPITAL_BASED_OUTPATIENT_CLINIC_OR_DEPARTMENT_OTHER)
Admission: RE | Disposition: A | Discharge status: Home / Self Care | Payer: Self-pay | Source: Ambulatory Visit | Attending: Urology

## 2012-01-29 ENCOUNTER — Encounter (HOSPITAL_BASED_OUTPATIENT_CLINIC_OR_DEPARTMENT_OTHER): Payer: Self-pay | Admitting: *Deleted

## 2012-01-29 DIAGNOSIS — I129 Hypertensive chronic kidney disease with stage 1 through stage 4 chronic kidney disease, or unspecified chronic kidney disease: Secondary | ICD-10-CM | POA: Insufficient documentation

## 2012-01-29 DIAGNOSIS — N200 Calculus of kidney: Secondary | ICD-10-CM | POA: Insufficient documentation

## 2012-01-29 DIAGNOSIS — Z905 Acquired absence of kidney: Secondary | ICD-10-CM | POA: Insufficient documentation

## 2012-01-29 DIAGNOSIS — N189 Chronic kidney disease, unspecified: Secondary | ICD-10-CM | POA: Insufficient documentation

## 2012-01-29 HISTORY — PX: CYSTOSCOPY W/ URETERAL STENT REMOVAL: SHX1430

## 2012-01-29 HISTORY — PX: CYSTOSCOPY WITH URETEROSCOPY: SHX5123

## 2012-01-29 HISTORY — DX: Other specified postprocedural states: Z98.890

## 2012-01-29 HISTORY — DX: Nausea with vomiting, unspecified: R11.2

## 2012-01-29 LAB — POCT I-STAT 4, (NA,K, GLUC, HGB,HCT)
HCT: 44 % (ref 39.0–52.0)
Hemoglobin: 15 g/dL (ref 13.0–17.0)
Potassium: 4 mEq/L (ref 3.5–5.1)
Sodium: 144 mEq/L (ref 135–145)

## 2012-01-29 SURGERY — REMOVAL, STENT, URETER, CYSTOSCOPIC
Anesthesia: General | Site: Ureter | Laterality: Left | Wound class: Clean Contaminated

## 2012-01-29 MED ORDER — DEXAMETHASONE SODIUM PHOSPHATE 4 MG/ML IJ SOLN
INTRAMUSCULAR | Status: DC | PRN
  Administered 2012-01-29: 10 mg via INTRAVENOUS

## 2012-01-29 MED ORDER — ONDANSETRON HCL 4 MG/2ML IJ SOLN: 4.0000 mg | Freq: Four times a day (QID) | INTRAMUSCULAR | Status: DC | PRN

## 2012-01-29 MED ORDER — SODIUM CHLORIDE 0.9 % IJ SOLN: 3.0000 mL | Freq: Two times a day (BID) | INTRAMUSCULAR | Status: DC

## 2012-01-29 MED ORDER — SODIUM CHLORIDE 0.9 % IV SOLN: 250.0000 mL | INTRAVENOUS | Status: DC | PRN

## 2012-01-29 MED ORDER — OXYBUTYNIN CHLORIDE 5 MG PO TABS
5.0000 mg | ORAL_TABLET | Freq: Once | ORAL | Status: AC
  Administered 2012-01-29: 5 mg via ORAL

## 2012-01-29 MED ORDER — LIDOCAINE HCL (CARDIAC) 20 MG/ML IV SOLN
INTRAVENOUS | Status: DC | PRN
  Administered 2012-01-29: 100 mg via INTRAVENOUS

## 2012-01-29 MED ORDER — MORPHINE SULFATE 2 MG/ML IJ SOLN: 2.0000 mg | INTRAMUSCULAR | Status: DC | PRN

## 2012-01-29 MED ORDER — ONDANSETRON HCL 4 MG/2ML IJ SOLN
INTRAMUSCULAR | Status: DC | PRN
  Administered 2012-01-29: 4 mg via INTRAVENOUS

## 2012-01-29 MED ORDER — CIPROFLOXACIN HCL 250 MG PO TABS: 250.0000 mg | ORAL_TABLET | Freq: Every day | ORAL | Status: AC

## 2012-01-29 MED ORDER — SODIUM CHLORIDE 0.9 % IJ SOLN: 3.0000 mL | INTRAMUSCULAR | Status: DC | PRN

## 2012-01-29 MED ORDER — LABETALOL HCL 5 MG/ML IV SOLN
INTRAVENOUS | Status: DC | PRN
  Administered 2012-01-29: 5 mg via INTRAVENOUS

## 2012-01-29 MED ORDER — HYDROCODONE-ACETAMINOPHEN 5-500 MG PO TABS: 1.0000 | ORAL_TABLET | Freq: Four times a day (QID) | ORAL | Status: AC | PRN

## 2012-01-29 MED ORDER — MIDAZOLAM HCL 5 MG/5ML IJ SOLN
INTRAMUSCULAR | Status: DC | PRN
  Administered 2012-01-29: 2 mg via INTRAVENOUS

## 2012-01-29 MED ORDER — PROPOFOL 10 MG/ML IV EMUL
INTRAVENOUS | Status: DC | PRN
  Administered 2012-01-29: 300 mg via INTRAVENOUS

## 2012-01-29 MED ORDER — LACTATED RINGERS IV SOLN
INTRAVENOUS | Status: DC
  Administered 2012-01-29 (×2): via INTRAVENOUS

## 2012-01-29 MED ORDER — SODIUM CHLORIDE 0.9 % IR SOLN
Status: DC | PRN
  Administered 2012-01-29: 6000 mL

## 2012-01-29 MED ORDER — PROMETHAZINE HCL 25 MG/ML IJ SOLN: 6.2500 mg | INTRAMUSCULAR | Status: DC | PRN

## 2012-01-29 MED ORDER — ACETAMINOPHEN 10 MG/ML IV SOLN
1000.0000 mg | Freq: Once | INTRAVENOUS | Status: AC
  Administered 2012-01-29: 1000 mg via INTRAVENOUS

## 2012-01-29 MED ORDER — BELLADONNA ALKALOIDS-OPIUM 16.2-60 MG RE SUPP
RECTAL | Status: DC | PRN
  Administered 2012-01-29: 1 via RECTAL

## 2012-01-29 MED ORDER — ACETAMINOPHEN 650 MG RE SUPP: 650.0000 mg | RECTAL | Status: DC | PRN

## 2012-01-29 MED ORDER — OXYCODONE HCL 5 MG PO TABS
5.0000 mg | ORAL_TABLET | ORAL | Status: DC | PRN
  Administered 2012-01-29: 5 mg via ORAL

## 2012-01-29 MED ORDER — ACETAMINOPHEN 325 MG PO TABS: 650.0000 mg | ORAL_TABLET | ORAL | Status: DC | PRN

## 2012-01-29 MED ORDER — FENTANYL CITRATE 0.05 MG/ML IJ SOLN
25.0000 ug | INTRAMUSCULAR | Status: DC | PRN
  Administered 2012-01-29: 25 ug via INTRAVENOUS

## 2012-01-29 MED ORDER — FENTANYL CITRATE 0.05 MG/ML IJ SOLN
INTRAMUSCULAR | Status: DC | PRN
  Administered 2012-01-29: 25 ug via INTRAVENOUS
  Administered 2012-01-29 (×2): 50 ug via INTRAVENOUS
  Administered 2012-01-29: 75 ug via INTRAVENOUS

## 2012-01-29 MED ORDER — CIPROFLOXACIN IN D5W 400 MG/200ML IV SOLN
400.0000 mg | INTRAVENOUS | Status: AC
  Administered 2012-01-29: 400 mg via INTRAVENOUS

## 2012-01-29 SURGICAL SUPPLY — 23 items
ADAPTER CATH URET PLST 4-6FR (CATHETERS) IMPLANT
ADPR CATH URET STRL DISP 4-6FR (CATHETERS)
BAG DRAIN URO-CYSTO SKYTR STRL (DRAIN) ×2 IMPLANT
BAG DRN UROCATH (DRAIN) ×1
BASKET ZERO TIP NITINOL 2.4FR (BASKET) ×1 IMPLANT
BSKT STON RTRVL ZERO TP 2.4FR (BASKET) ×1
CANISTER SUCT LVC 12 LTR MEDI- (MISCELLANEOUS) ×1 IMPLANT
CATH INTERMIT  6FR 70CM (CATHETERS) IMPLANT
CLOTH BEACON ORANGE TIMEOUT ST (SAFETY) ×2 IMPLANT
DRAPE CAMERA CLOSED 9X96 (DRAPES) ×2 IMPLANT
GLOVE BIO SURGEON STRL SZ8 (GLOVE) ×2 IMPLANT
GOWN PREVENTION PLUS LG XLONG (DISPOSABLE) ×2 IMPLANT
GOWN STRL REIN XL XLG (GOWN DISPOSABLE) ×2 IMPLANT
GOWN XL W/COTTON TOWEL STD (GOWNS) ×2 IMPLANT
GUIDEWIRE 0.038 PTFE COATED (WIRE) IMPLANT
GUIDEWIRE ANG ZIPWIRE 038X150 (WIRE) IMPLANT
GUIDEWIRE STR DUAL SENSOR (WIRE) IMPLANT
IV NS IRRIG 3000ML ARTHROMATIC (IV SOLUTION) ×3 IMPLANT
LASER FIBER DISP (UROLOGICAL SUPPLIES) ×1 IMPLANT
NS IRRIG 500ML POUR BTL (IV SOLUTION) ×1 IMPLANT
PACK CYSTOSCOPY (CUSTOM PROCEDURE TRAY) ×2 IMPLANT
SHEATH ACCESS URETERAL 38CM (SHEATH) ×1 IMPLANT
SHEATH ACCESS URETERAL 54CM (SHEATH) ×1 IMPLANT

## 2012-01-29 NOTE — Progress Notes (Signed)
Pt c/o spams on l side.  Dr. Normajean Baxter paged , informed of oxycodone and fentanyl 25 mcg given w/ relief 5/10 prior voiding.  Orders received for tylenol iv and ditropan po.

## 2012-01-29 NOTE — Interval H&P Note (Signed)
History and Physical Interval Note:  01/29/2012 12:19 PM  Jake Brown  has presented today for surgery, with the diagnosis of LEFT URETEROPELVIC JUNCTION CALCULUS  The various methods of treatment have been discussed with the patient and family. After consideration of risks, benefits and other options for treatment, the patient has consented to  Procedure(s) (LRB): CYSTOSCOPY WITH STENT REMOVAL (Left) CYSTOSCOPY WITH URETEROSCOPY (Left) HOLMIUM LASER APPLICATION (Left) as a surgical intervention .  The patient's history has been reviewed, patient examined, no change in status, stable for surgery.  I have reviewed the patients' chart and labs.  Questions were answered to the patient's satisfaction.     Chelsea Aus

## 2012-01-29 NOTE — Op Note (Signed)
Preoperative diagnosis: Left renal pelvic stone in solitary kidney Postoperative diagnosis: Same  Procedure: Cystoscopy, left double-J stent removal, left ureteroscopy with flexible scope, holmium laser lithotripsy and extraction of left renal calculus, double-J stent placement (24 cm x 6 French contour stent with tether)   Surgeon: Bertram Millard. Lamondre Wesche, M.D.  Anesthesia: Gen.  Indications: 46 year-old male with a solitary kidney on the left secondary to prior right nephrectomy for renal cell carcinoma. He recently presented with flank pain and the large left renal pelvic stone. Stent was placed urgently a couple of weeks ago, and he presents at this time for ureteroscopy, holmium laser lithotripsy and extraction of the stone. Risks and complications of the procedure have been discussed with the patient. He understands these and desires to proceed.     Technique and findings: The patient was properly identified in the holding area and received preoperative IV antibiotics. He was taken to the operating room where general anesthetic was administered with the LMA. He was placed in the dorsolithotomy position. Genitalia and perineum were prepped and draped. Proper timeout was then performed.  The procedure then commenced. A 22 French panendoscope was passed under direct vision to the urethra which was normal. The prostate was nonobstructive. The left double-J stent was identified, grasped, and the distals portion brought through the urethra. I then navigated a guidewire through the stent, up in the left ureter in the renal pelvis fluoroscopically. The old stent was removed. I then placed a 38 cm ureteral access sheath over the guidewire up into the more proximal ureter using fluoroscopic guidance. I then passed the flexible digital ureteroscope through the ureteral access sheath with the inner quarter removed. I then easily guided into the left renal pelvis and inspected the calyceal system. In 100 made  calyces posteriorly was located the one stone. No other stones were encountered. I then grasped the stone, which was not easily accessed with the laser, and removed it to a more posterior/superior calyx. I then released the stone from the Nitinol basket, removed the basket, and fragmented the stone into multiple small fragments. These fragments were then identified, and then removed through the ureteral access sheath (I changed it to a 50 cm during the treatment) using the Nitinol basket. There was one stone that I grasped, but then it became lost and some blood clots in the renal pelvis. I feel that I removed approximately 95% the fragments, however. Most fragments were approximately 2 mm in size. Following reinspection of all the calyces and finding no more stones, I then placed a guidewire through the ureteral access sheath, removed the ureteral access sheath, and cystoscopically placed a 6 French, 24 cm contour stent with a tether on. The bladder was drained following placement of double-J stent. The scope was removed, and the string from the stent taped to the patient's penis. He received a B&O suppository during the procedure. He tolerated procedure well and was returned to the PACU in stable condition.

## 2012-01-29 NOTE — Transfer of Care (Signed)
Immediate Anesthesia Transfer of Care Note  Patient: Jake Brown  Procedure(s) Performed: Procedure(s) (LRB): CYSTOSCOPY WITH STENT REMOVAL (Left) CYSTOSCOPY WITH URETEROSCOPY (Left) HOLMIUM LASER APPLICATION (Left)  Patient Location: PACU  Anesthesia Type: General  Level of Consciousness: awake, alert  and oriented  Airway & Oxygen Therapy: Patient Spontanous Breathing and Patient connected to face mask oxygen  Post-op Assessment: Report given to PACU RN  Post vital signs: Reviewed and stable  Complications: No apparent anesthesia complications

## 2012-01-29 NOTE — Anesthesia Preprocedure Evaluation (Addendum)
Anesthesia Evaluation  Patient identified by MRN, date of birth, ID band Patient awake  General Assessment Comment:Past Medical History   Diagnosis  Date   .  Difficult intubation     .  Chronic kidney disease     .  Hypertension     .  Arthritis     .  Anxiety     .  Hiatal hernia     .  Heart murmur         no symptoms   .  Cancer of kidney         right renal carcinoma (nephrectomy )     Reviewed: Allergy & Precautions, H&P , NPO status , Patient's Chart, lab work & pertinent test results, reviewed documented beta blocker date and time   History of Anesthesia Complications (+) PONV and DIFFICULT AIRWAY  Airway Mallampati: II TM Distance: >3 FB Neck ROM: full    Dental No notable dental hx.    Pulmonary former smoker,  Probable sleep apnea due to body/neck size. breath sounds clear to auscultation  Pulmonary exam normal       Cardiovascular Exercise Tolerance: Good hypertension, On Medications negative cardio ROS  + Valvular Problems/Murmurs Rhythm:regular Rate:Normal     Neuro/Psych negative neurological ROS  negative psych ROS   GI/Hepatic negative GI ROS, Neg liver ROS,   Endo/Other  negative endocrine ROSMorbid obesity  Renal/GU negative Renal ROS  negative genitourinary   Musculoskeletal negative musculoskeletal ROS (+)   Abdominal   Peds negative pediatric ROS (+)  Hematology negative hematology ROS (+)   Anesthesia Other Findings   Reproductive/Obstetrics negative OB ROS                         Anesthesia Physical Anesthesia Plan  ASA: III  Anesthesia Plan: General   Post-op Pain Management:    Induction: Intravenous  Airway Management Planned: LMA  Additional Equipment:   Intra-op Plan:   Post-operative Plan: Extubation in OR  Informed Consent: I have reviewed the patients History and Physical, chart, labs and discussed the procedure including the risks,  benefits and alternatives for the proposed anesthesia with the patient or authorized representative who has indicated his/her understanding and acceptance.   Dental Advisory Given  Plan Discussed with: CRNA  Anesthesia Plan Comments: (LMA attempted two weeks ago was unsuccessful and glidescope used for ETT placement.)       Anesthesia Quick Evaluation

## 2012-01-29 NOTE — Anesthesia Procedure Notes (Addendum)
Procedure Name: LMA Insertion Date/Time: 01/29/2012 12:24 PM Performed by: Maris Berger T Pre-anesthesia Checklist: Patient identified, Emergency Drugs available, Suction available and Patient being monitored Patient Re-evaluated:Patient Re-evaluated prior to inductionOxygen Delivery Method: Circle System Utilized Preoxygenation: Pre-oxygenation with 100% oxygen Intubation Type: IV induction Ventilation: Mask ventilation without difficulty LMA: LMA inserted LMA Size: 5.0 Number of attempts: 1 Airway Equipment and Method: bite block Placement Confirmation: positive ETCO2 Dental Injury: Teeth and Oropharynx as per pre-operative assessment

## 2012-02-02 NOTE — Anesthesia Postprocedure Evaluation (Signed)
Anesthesia Post Note  Patient: Jake Brown  Procedure(s) Performed: Procedure(s) (LRB): CYSTOSCOPY WITH STENT REMOVAL (Left) CYSTOSCOPY WITH URETEROSCOPY (Left) HOLMIUM LASER APPLICATION (Left)  Anesthesia type: General  Patient location: PACU  Post pain: Pain level controlled  Post assessment: Post-op Vital signs reviewed  Last Vitals:  Filed Vitals:   01/29/12 1630  BP: 141/90  Pulse: 91  Temp: 36.5 C  Resp: 14    Post vital signs: Reviewed  Level of consciousness: sedated  Complications: No apparent anesthesia complications

## 2012-02-03 ENCOUNTER — Encounter (HOSPITAL_BASED_OUTPATIENT_CLINIC_OR_DEPARTMENT_OTHER): Payer: Self-pay | Admitting: Urology

## 2014-02-08 ENCOUNTER — Ambulatory Visit (INDEPENDENT_AMBULATORY_CARE_PROVIDER_SITE_OTHER): Payer: 59

## 2014-02-08 ENCOUNTER — Ambulatory Visit (INDEPENDENT_AMBULATORY_CARE_PROVIDER_SITE_OTHER): Payer: 59 | Admitting: Emergency Medicine

## 2014-02-08 VITALS — BP 146/100 | HR 91 | Temp 97.9°F | Resp 18 | Ht 69.0 in | Wt 316.6 lb

## 2014-02-08 DIAGNOSIS — M25569 Pain in unspecified knee: Secondary | ICD-10-CM

## 2014-02-08 DIAGNOSIS — G894 Chronic pain syndrome: Secondary | ICD-10-CM | POA: Insufficient documentation

## 2014-02-08 DIAGNOSIS — M25561 Pain in right knee: Secondary | ICD-10-CM

## 2014-02-08 MED ORDER — DICLOFENAC SODIUM 1 % TD GEL: 4.0000 g | Freq: Three times a day (TID) | TRANSDERMAL | Status: DC

## 2014-02-08 NOTE — Progress Notes (Signed)
   Subjective:    Patient ID: Jake Brown, male    DOB: [birth year], 48 y.o.   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 Reports that he was walking in an area with high grass and stepped in a hole. Feels the he hyper extended his knee. States that he has more pain on the inner side of his knee.  Reports the he takes oxycodone prescribe by Dr. Hardin Negus and that it is not helping to ease the pain.   Review of Systems     Objective:   Physical Exam examination the right leg reveals tenderness over the medial cartilage. There is no instability noted. There is a negative McMurray test. There is a negative anterior drawer sign. There is not significant fluid noted in the joint. [facility] reading (PRIMARY) by  Dr.Phyliss Hulick there is significant arthritic changes posterior patella. The films are otherwise unremarkable.        Assessment & Plan:  Patient has an injury to the medial meniscus of the right knee. He has a previous injury to the left knee with hyperextension of that knee. We'll try him in a hinged knee brace appointment with the orthopedist for followup.

## 2014-02-08 NOTE — Patient Instructions (Signed)
Knee, Cartilage (Meniscus) Injury It is suspected that you have a torn cartilage (meniscus) in your knee. The menisci are made of tough cartilage, and fit between the surfaces of the thigh and leg bones. The menisci are "C"shaped and have a wedged profile. The wedged profile helps the stability of the joint by keeping the rounded femur surface from sliding off the flat tibial surface. The menisci are fed (nourished) by small blood vessels; but there is also a large area at the inner edge of the meniscus that does not have a good blood supply (avascular). This presents a problem when there is an injury to the meniscus, because areas without good blood supply heal poorly. As a result when there is a torn cartilage in the knee, surgery is often required to fix it. This is usually done with a surgical procedure less invasive than open surgery (arthroscopy). Some times open surgery of the knee is required if there is other damage. PURPOSE OF THE MENISCUS The medial meniscus rests on the medial tibial plateau. The tibia is the large bone in your lower leg (the shin bone). The medial tibial plateau is the upper end of the bone making up the inner part of your knee. The lateral meniscus serves the same purpose and is located on the outside of the knee. The menisci help to distribute your body weight across the knee joint; they act as shock absorbers. Without the meniscus present, the weight of your body would be unevenly applied to the bones in your legs (the femur and tibia). The femur is the large bone in your thigh. This uneven weight distribution would cause increased wear and tear on the cartilage lining the joint surfaces, leading to early damage (arthritis) of these areas. The presence of the menisci cartilage is necessary for a healthy knee. PURPOSE OF THE KNEE CARTILAGE The knee joint is made up of three bones: the thigh bone (femur), the shin bone (tibia), and the knee cap (patella). The surfaces of these  bones at the knee joint are covered with cartilage called articular cartilage. This smooth slippery surface allows the bones to slide against each other without causing bone damage. The meniscus sits between these cartilaginous surfaces of the bones. It distributes the weight evenly in the joints and helps with the stability of the joint (keeps the joint steady). HOME CARE INSTRUCTIONS  Use crutches and external braces as instructed.  Once home an ice pack applied to your injured knee may help with discomfort and keep the swelling down. An ice pack can be used for the first couple of days or as instructed.  Only take over-the-counter or prescription medicines for pain, discomfort, or fever as directed by your caregiver.  Call if you do not have relief of pain with medications or if there is increasing in pain.  Call if your foot becomes cold or blue.  You may resume normal diet and activities as directed.  Make sure to keep your appointment with your follow-up caregiver. This injury may require further evaluation and treatment beyond the temporary treatment given today. Document Released: 09/27/2002 Document Revised: 09/29/2011 Document Reviewed: 01/19/2009 [facility] Patient Information 2015 [place], [place]. This information is not intended to replace advice given to you by your health care provider. Make sure you discuss any questions you have with your health care provider.

## 2014-05-11 ENCOUNTER — Other Ambulatory Visit: Payer: Self-pay | Admitting: Emergency Medicine

## 2014-11-27 ENCOUNTER — Other Ambulatory Visit: Payer: Self-pay | Admitting: Family Medicine

## 2014-11-27 ENCOUNTER — Ambulatory Visit (INDEPENDENT_AMBULATORY_CARE_PROVIDER_SITE_OTHER): Payer: 59 | Admitting: Family Medicine

## 2014-11-27 ENCOUNTER — Encounter: Payer: Self-pay | Admitting: Family Medicine

## 2014-11-27 VITALS — BP 158/100 | HR 107 | Temp 98.3°F | Resp 18 | Ht 68.0 in | Wt 315.0 lb

## 2014-11-27 DIAGNOSIS — R000 Tachycardia, unspecified: Secondary | ICD-10-CM

## 2014-11-27 DIAGNOSIS — R946 Abnormal results of thyroid function studies: Secondary | ICD-10-CM

## 2014-11-27 DIAGNOSIS — I10 Essential (primary) hypertension: Secondary | ICD-10-CM | POA: Diagnosis not present

## 2014-11-27 DIAGNOSIS — E785 Hyperlipidemia, unspecified: Secondary | ICD-10-CM

## 2014-11-27 DIAGNOSIS — Z131 Encounter for screening for diabetes mellitus: Secondary | ICD-10-CM | POA: Diagnosis not present

## 2014-11-27 LAB — POCT URINALYSIS DIPSTICK
Bilirubin, UA: NEGATIVE
Glucose, UA: NEGATIVE
Ketones, UA: NEGATIVE
LEUKOCYTES UA: NEGATIVE
NITRITE UA: NEGATIVE
PH UA: 6.5
PROTEIN UA: NEGATIVE
Spec Grav, UA: 1.02
UROBILINOGEN UA: 0.2

## 2014-11-27 LAB — COMPREHENSIVE METABOLIC PANEL
ALBUMIN: 4.5 g/dL (ref 3.5–5.2)
ALK PHOS: 94 U/L (ref 39–117)
ALT: 26 U/L (ref 0–53)
AST: 28 U/L (ref 0–37)
BILIRUBIN TOTAL: 0.6 mg/dL (ref 0.2–1.2)
BUN: 17 mg/dL (ref 6–23)
CO2: 30 mEq/L (ref 19–32)
CREATININE: 1.33 mg/dL (ref 0.50–1.35)
Calcium: 9.9 mg/dL (ref 8.4–10.5)
Chloride: 94 mEq/L — ABNORMAL LOW (ref 96–112)
GLUCOSE: 95 mg/dL (ref 70–99)
POTASSIUM: 3.4 meq/L — AB (ref 3.5–5.3)
Sodium: 138 mEq/L (ref 135–145)
Total Protein: 8.1 g/dL (ref 6.0–8.3)

## 2014-11-27 MED ORDER — ATORVASTATIN CALCIUM 10 MG PO TABS: 10.0000 mg | ORAL_TABLET | Freq: Every day | ORAL | Status: DC

## 2014-11-27 MED ORDER — CHLORTHALIDONE 25 MG PO TABS: 25.0000 mg | ORAL_TABLET | Freq: Every day | ORAL | Status: DC

## 2014-11-27 MED ORDER — AMLODIPINE BESYLATE 10 MG PO TABS: 10.0000 mg | ORAL_TABLET | Freq: Every day | ORAL | Status: DC

## 2014-11-27 NOTE — Patient Instructions (Addendum)
1.  FOR ALLERGIES, RESTART FLONASE NASAL SPRAY DAILY.   ALSO TAKE CLARITIN OR ZYRTEC OR ALLEGRA DAILY FOR ALLERGIES.   Hypertension Hypertension, commonly called high blood pressure, is when the force of blood pumping through your arteries is too strong. Your arteries are the blood vessels that carry blood from your heart throughout your body. A blood pressure reading consists of a higher number over a lower number, such as 110/72. The higher number (systolic) is the pressure inside your arteries when your heart pumps. The lower number (diastolic) is the pressure inside your arteries when your heart relaxes. Ideally you want your blood pressure below 120/80. Hypertension forces your heart to work harder to pump blood. Your arteries may become narrow or stiff. Having hypertension puts you at risk for heart disease, stroke, and other problems.  RISK FACTORS Some risk factors for high blood pressure are controllable. Others are not.  Risk factors you cannot control include:   Race. You may be at higher risk if you are African American.  Age. Risk increases with age.  Gender. Men are at higher risk than women before age 64 years. After age 27, women are at higher risk than men. Risk factors you can control include:  Not getting enough exercise or physical activity.  Being overweight.  Getting too much fat, sugar, calories, or salt in your diet.  Drinking too much alcohol. SIGNS AND SYMPTOMS Hypertension does not usually cause signs or symptoms. Extremely high blood pressure (hypertensive crisis) may cause headache, anxiety, shortness of breath, and nosebleed. DIAGNOSIS  To check if you have hypertension, your health care provider will measure your blood pressure while you are seated, with your arm held at the level of your heart. It should be measured at least twice using the same arm. Certain conditions can cause a difference in blood pressure between your right and left arms. A blood  pressure reading that is higher than normal on one occasion does not mean that you need treatment. If one blood pressure reading is high, ask your health care provider about having it checked again. TREATMENT  Treating high blood pressure includes making lifestyle changes and possibly taking medicine. Living a healthy lifestyle can help lower high blood pressure. You may need to change some of your habits. Lifestyle changes may include:  Following the DASH diet. This diet is high in fruits, vegetables, and whole grains. It is low in salt, red meat, and added sugars.  Getting at least 2 hours of brisk physical activity every week.  Losing weight if necessary.  Not smoking.  Limiting alcoholic beverages.  Learning ways to reduce stress. If lifestyle changes are not enough to get your blood pressure under control, your health care provider may prescribe medicine. You may need to take more than one. Work closely with your health care provider to understand the risks and benefits. HOME CARE INSTRUCTIONS  Have your blood pressure rechecked as directed by your health care provider.   Take medicines only as directed by your health care provider. Follow the directions carefully. Blood pressure medicines must be taken as prescribed. The medicine does not work as well when you skip doses. Skipping doses also puts you at risk for problems.   Do not smoke.   Monitor your blood pressure at home as directed by your health care provider. SEEK MEDICAL CARE IF:   You think you are having a reaction to medicines taken.  You have recurrent headaches or feel dizzy.  You have swelling  in your ankles.  You have trouble with your vision. SEEK IMMEDIATE MEDICAL CARE IF:  You develop a severe headache or confusion.  You have unusual weakness, numbness, or feel faint.  You have severe chest or abdominal pain.  You vomit repeatedly.  You have trouble breathing. MAKE SURE YOU:   Understand  these instructions.  Will watch your condition.  Will get help right away if you are not doing well or get worse. Document Released: 07/07/2005 Document Revised: 11/21/2013 Document Reviewed: 04/29/2013 [facility] Patient Information 2015 [place], [place]. This information is not intended to replace advice given to you by your health care provider. Make sure you discuss any questions you have with your health care provider.
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
## 2014-11-28 LAB — HEMOGLOBIN A1C
HEMOGLOBIN A1C: 5.7 % — AB (ref <5.7)
Mean Plasma Glucose: 117 mg/dL — ABNORMAL HIGH (ref <117)

## 2014-11-28 LAB — CBC WITH DIFFERENTIAL/PLATELET
Basophils Absolute: 0 10*3/uL (ref 0.0–0.1)
Basophils Relative: 0 % (ref 0–1)
Eosinophils Absolute: 0.4 10*3/uL (ref 0.0–0.7)
Eosinophils Relative: 4 % (ref 0–5)
HEMATOCRIT: 48.5 % (ref 39.0–52.0)
HEMOGLOBIN: 16.9 g/dL (ref 13.0–17.0)
LYMPHS ABS: 2.8 10*3/uL (ref 0.7–4.0)
LYMPHS PCT: 27 % (ref 12–46)
MCH: 29.5 pg (ref 26.0–34.0)
MCHC: 34.8 g/dL (ref 30.0–36.0)
MCV: 84.8 fL (ref 78.0–100.0)
MONO ABS: 0.7 10*3/uL (ref 0.1–1.0)
MONOS PCT: 7 % (ref 3–12)
MPV: 11.4 fL (ref 8.6–12.4)
NEUTROS ABS: 6.4 10*3/uL (ref 1.7–7.7)
NEUTROS PCT: 62 % (ref 43–77)
Platelets: 294 10*3/uL (ref 150–400)
RBC: 5.72 MIL/uL (ref 4.22–5.81)
RDW: 15.3 % (ref 11.5–15.5)
WBC: 10.3 10*3/uL (ref 4.0–10.5)

## 2014-11-28 LAB — TSH: TSH: 0.334 u[IU]/mL — AB (ref 0.350–4.500)

## 2014-11-30 LAB — T4, FREE: Free T4: 1.26 ng/dL (ref 0.80–1.80)

## 2014-11-30 MED ORDER — POTASSIUM CHLORIDE CRYS ER 20 MEQ PO TBCR: 20.0000 meq | EXTENDED_RELEASE_TABLET | Freq: Every day | ORAL | Status: DC

## 2014-12-02 ENCOUNTER — Telehealth: Payer: Self-pay | Admitting: Family Medicine

## 2014-12-02 NOTE — Telephone Encounter (Signed)
Called patient to reschedule his appt on 01/01/15 to 01/12/15 he states that hes moving that he had cancelled his appt and that his new appt is 12/20/14 i do not see his appt anywhere

## 2014-12-21 ENCOUNTER — Ambulatory Visit (INDEPENDENT_AMBULATORY_CARE_PROVIDER_SITE_OTHER): Payer: 59 | Admitting: Family Medicine

## 2014-12-21 VITALS — BP 128/90 | HR 92 | Temp 97.7°F | Resp 18 | Ht 67.0 in | Wt 313.0 lb

## 2014-12-21 DIAGNOSIS — R61 Generalized hyperhidrosis: Secondary | ICD-10-CM | POA: Diagnosis not present

## 2014-12-21 DIAGNOSIS — I10 Essential (primary) hypertension: Secondary | ICD-10-CM

## 2014-12-21 DIAGNOSIS — G894 Chronic pain syndrome: Secondary | ICD-10-CM | POA: Diagnosis not present

## 2014-12-21 DIAGNOSIS — J029 Acute pharyngitis, unspecified: Secondary | ICD-10-CM | POA: Diagnosis not present

## 2014-12-21 DIAGNOSIS — R002 Palpitations: Secondary | ICD-10-CM

## 2014-12-21 DIAGNOSIS — F430 Acute stress reaction: Secondary | ICD-10-CM

## 2014-12-21 DIAGNOSIS — R946 Abnormal results of thyroid function studies: Secondary | ICD-10-CM

## 2014-12-21 DIAGNOSIS — R9431 Abnormal electrocardiogram [ECG] [EKG]: Secondary | ICD-10-CM

## 2014-12-21 DIAGNOSIS — R7989 Other specified abnormal findings of blood chemistry: Secondary | ICD-10-CM

## 2014-12-21 DIAGNOSIS — I341 Nonrheumatic mitral (valve) prolapse: Secondary | ICD-10-CM

## 2014-12-21 LAB — POCT URINALYSIS DIPSTICK
BILIRUBIN UA: NEGATIVE
Blood, UA: NEGATIVE
Glucose, UA: NEGATIVE
KETONES UA: NEGATIVE
LEUKOCYTES UA: NEGATIVE
Nitrite, UA: NEGATIVE
PH UA: 6
SPEC GRAV UA: 1.02
UROBILINOGEN UA: 0.2

## 2014-12-21 LAB — POCT CBC
Granulocyte percent: 70.5 %G (ref 37–80)
HCT, POC: 47.3 % (ref 43.5–53.7)
Hemoglobin: 15.1 g/dL (ref 14.1–18.1)
Lymph, poc: 2.6 (ref 0.6–3.4)
MCH, POC: 28.4 pg (ref 27–31.2)
MCHC: 31.8 g/dL (ref 31.8–35.4)
MCV: 89.2 fL (ref 80–97)
MID (CBC): 0.7 (ref 0–0.9)
MPV: 9.1 fL (ref 0–99.8)
PLATELET COUNT, POC: 257 10*3/uL (ref 142–424)
POC GRANULOCYTE: 7.8 — AB (ref 2–6.9)
POC LYMPH PERCENT: 23.5 %L (ref 10–50)
POC MID %: 6 % (ref 0–12)
RBC: 5.3 M/uL (ref 4.69–6.13)
RDW, POC: 15.2 %
WBC: 11.1 10*3/uL — AB (ref 4.6–10.2)

## 2014-12-21 LAB — COMPREHENSIVE METABOLIC PANEL
ALT: 28 U/L (ref 0–53)
AST: 26 U/L (ref 0–37)
Albumin: 4.2 g/dL (ref 3.5–5.2)
Alkaline Phosphatase: 81 U/L (ref 39–117)
BUN: 18 mg/dL (ref 6–23)
CALCIUM: 9.2 mg/dL (ref 8.4–10.5)
CHLORIDE: 98 meq/L (ref 96–112)
CO2: 30 meq/L (ref 19–32)
Creat: 1.37 mg/dL — ABNORMAL HIGH (ref 0.50–1.35)
Glucose, Bld: 102 mg/dL — ABNORMAL HIGH (ref 70–99)
Potassium: 3.3 mEq/L — ABNORMAL LOW (ref 3.5–5.3)
SODIUM: 140 meq/L (ref 135–145)
TOTAL PROTEIN: 7 g/dL (ref 6.0–8.3)
Total Bilirubin: 0.4 mg/dL (ref 0.2–1.2)

## 2014-12-21 LAB — POCT RAPID STREP A (OFFICE): Rapid Strep A Screen: NEGATIVE

## 2014-12-21 LAB — GLUCOSE, POCT (MANUAL RESULT ENTRY): POC Glucose: 94 mg/dl (ref 70–99)

## 2014-12-21 NOTE — Progress Notes (Signed)
Subjective:    Patient ID: Jake Brown, male    DOB: Dec 18, 1965, 49 y.o.   MRN: [id number]  12/21/2014  Follow-up   HPI This 49 y.o. male presents for one month follow-up:  1. HTN: management changes made at last visit include restarting Amlodipine 10mg  daily and Hygroton 25mg  daily.  Denies CP/palp/SOB/leg swelling.    2.  Abnormal TSH:  Detected at visit one month ago; recommend repeating at next visit. Worried that current symptoms of fatigue are secondary to evolving thyroid process.  3. Fatigue: get really sweaty and warm; will last for several hours.  Left work today due to feeling clammy.  Cooled down; laid down for one hour.  Similar symptoms last week; slept really hard for three hours.  Unable to lose weight despite trying to lose weight.  Onset four weeks ago.  Frequency once per week; duration 1-3 hours.  Feels like whole body is really achy; hurts all over.  Gets sore throat each time.  +HA.  No ear pain.  Blurred vision in one eye and thought due to pollen.  Has dry eyes as well.  Excessive tears B eyes.  No chest pain; +tachycardia at times; has known MVP diagnosed age 74.  Feels a fluttering in chest. Last echo unknown.  No previous stress test.  No previous sleep study.  No hypersomnolence.  Onset before visit one month ago.  Under a lot of stress; moving to [place].  Just resigned from job yesterday.    Episodes always develop at work.  Felt poorly this morning upon awakening.  Symptoms always get worse at work.  Work is very stressful.  Worked all weekend over [employer] work issues.  Works 14 hour days.  Worked until 1:40 on Saturday morning; then woke up at 6:00am.  Worked 13 hours on Saturday.  Sleeping well when sleeping; taking more Xanax at night to help with sleep.  Has dogs that are great stress relievers.    4. Sore throat: currently having sore throat. No fever/chills; +sweats.  +HA.  +rhinorrhea; +nasal congestion; no cough. No n/v/d.  No rash.  No  sick contacts.  Left work early today.  5. Obesity:  Will eat a small breakfast; does not eat much during lunch; eats supper; gets really hungry.    6. Chronic pain syndrome: trying to decrease pain medication intake.  Must monitor intake of narcotics; takes slow release narcotic; does not take slow release medication; one every 4-6 hours as needed.  Only took one this morning; took another in afternoon.  Trying to decrease frequency.  S/p procedure two weeks ago.  Wants to know if procedure is effective.  Was taking one extended tablet daily; was taking four PRN pills per day.  These episodes started BEFORE cutting back on narcotics.  Review of Systems  Constitutional: Positive for diaphoresis. Negative for fever, chills, activity change, appetite change and fatigue.  HENT: Positive for congestion and rhinorrhea. Negative for ear pain.   Eyes: Positive for visual disturbance. Negative for pain, discharge and itching.  Respiratory: Negative for cough and shortness of breath.   Cardiovascular: Positive for palpitations. Negative for chest pain and leg swelling.  Gastrointestinal: Negative for nausea, vomiting and diarrhea.  Endocrine: Negative for cold intolerance, heat intolerance, polydipsia, polyphagia and polyuria.  Musculoskeletal: Positive for myalgias.  Skin: Negative for rash.  Neurological: Positive for headaches. Negative for dizziness, tremors, seizures, syncope, facial asymmetry, speech difficulty, weakness, light-headedness and numbness.  Psychiatric/Behavioral: Positive for sleep  disturbance. Negative for suicidal ideas, self-injury and dysphoric mood. The patient is nervous/anxious.     Past Medical History  Diagnosis Date  . Hypertension   . Arthritis   . Anxiety   . PONV (postoperative nausea and vomiting)   . Difficult intubation     no problems 01/17/12-stated "small esophagus"  . Heart murmur 1983 noted    no symptoms  . Hyperlipidemia   . PTSD (post-traumatic stress  disorder)   . Degenerative disc disease, lumbar   . Degenerative disc disease, cervical   . Chronic kidney disease   . Cancer of kidney     right renal carcinoma (nephrectomy )  . Nephrolithiasis    Past Surgical History  Procedure Laterality Date  . C4-5  surgery  2004  . Cystoscopy w/ ureteral stent placement  01/17/2012    Procedure: CYSTOSCOPY WITH RETROGRADE PYELOGRAM/URETERAL STENT PLACEMENT;  Surgeon: Hanley Ben, MD;  Location: [facility];  Service: Urology;  Laterality: Left;  . Laparoscpic ventral hernia repair with mesh and extensive lysis adhesions  11-08-2010    RIGHT SUBCOSTAL VENTRAL INCISIONAL HERNIA  S/P RIGHT RADIAL  NEPHRECTOMY  . Laminectomy and microdiscectomy cervical spine  09-16-2006    RIGHT SIDE,  C6 - 7  . Transabdominal right radical nephrectomy  12-19-1999    LARGE RIGHT RENAL CELL CARCINOMA  . Ureteral reimplantion  CHILD    AND REMOVAL HUTCH DIVERTICULUM  . Orif femur fx    . Cystoscopy w/ ureteral stent removal  01/29/2012    Procedure: CYSTOSCOPY WITH STENT REMOVAL;  Surgeon: Franchot Gallo, MD;  Location[facility];  Service: Urology;  Laterality: Left;     . Cystoscopy with ureteroscopy  01/29/2012    Procedure: CYSTOSCOPY WITH URETEROSCOPY;  Surgeon: Franchot Gallo, MD;  Location: [facility];  Service: Urology;  Laterality: Left;   No Known Allergies Current Outpatient Prescriptions  Medication Sig Dispense Refill  . ALPRAZolam (XANAX) 0.5 MG tablet Take 0.5 mg by mouth 2 (two) times daily as needed. anxiety    . amLODipine (NORVASC) 10 MG tablet Take 1 tablet (10 mg total) by mouth daily. 90 tablet 3  . atorvastatin (LIPITOR) 10 MG tablet Take 1 tablet (10 mg total) by mouth daily at 6 PM. 90 tablet 3  . chlorthalidone (HYGROTON) 25 MG tablet Take 1 tablet (25 mg total) by mouth daily. 90 tablet 3  . gabapentin (NEURONTIN) 300 MG capsule     . methocarbamol (ROBAXIN) 750 MG tablet Take 750 mg by mouth 3  (three) times daily as needed. Muscle spasms    . oxyCODONE-acetaminophen (PERCOCET) 10-325 MG per tablet     . OXYCONTIN 15 MG T12A 12 hr tablet Take 15 mg by mouth every 12 (twelve) hours.  0  . OXYCONTIN 20 MG T12A 12 hr tablet Take 20 mg by mouth every 12 (twelve) hours.  0  . potassium chloride SA (K-DUR,KLOR-CON) 20 MEQ tablet Take 1 tablet (20 mEq total) by mouth daily. 90 tablet 3  . VOLTAREN 1 % GEL APPLY 4 G TOPICALLY 3 (THREE) TIMES DAILY. (Patient not taking: Reported on 11/27/2014) 100 g 2   No current facility-administered medications for this visit.   History   Social History  . Marital Status: Married    Spouse Name: N/A  . Number of Children: N/A  . Years of Education: N/A   Occupational History  . Not on file.   Social History Main Topics  . Smoking status: Former Smoker --  20 years    Quit date: 07/21/1993  . Smokeless tobacco: Not on file  . Alcohol Use: No  . Drug Use: No  . Sexual Activity: Yes   Other Topics Concern  . 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       Drugs: none      Exercise:  none   Family History  Problem Relation Age of Onset  . Hypertension Mother   . Arthritis Mother   . Hyperlipidemia Father   . Cancer Father 63    bladder cancer       Objective:    BP 128/90 mmHg  Pulse 92  Temp(Src) 97.7 F (36.5 C) (Oral)  Resp 18  Ht 5\' 7"  (1.702 m)  Wt 313 lb (141.976 kg)  BMI 49.01 kg/m2  SpO2 94% Physical Exam  Constitutional: He is oriented to person, place, and time. He appears well-developed and well-nourished. No distress.  Morbidly obese.  HENT:  Head: Normocephalic and atraumatic.  Right Ear: External ear normal.  Left Ear: External ear normal.  Nose: Nose normal.  Mouth/Throat: Oropharynx is clear and moist.  Eyes: Conjunctivae and EOM  are normal. Pupils are equal, round, and reactive to light.  Neck: Normal range of motion. Neck supple. Carotid bruit is not present. No thyromegaly present.  Cardiovascular: Normal rate, regular rhythm, normal heart sounds and intact distal pulses.  Exam reveals no gallop and no friction rub.   No murmur heard. Pulmonary/Chest: Effort normal and breath sounds normal. He has no wheezes. He has no rales.  Abdominal: Soft. Bowel sounds are normal. He exhibits no distension and no mass. There is no tenderness. There is no rebound and no guarding.  Musculoskeletal:       Right shoulder: He exhibits pain. He exhibits normal range of motion, no tenderness and no bony tenderness.       Left shoulder: He exhibits pain. He exhibits normal range of motion, no tenderness and no bony tenderness.       Cervical back: He exhibits pain. He exhibits normal range of motion, no tenderness and no bony tenderness.       Lumbar back: He exhibits pain. He exhibits normal range of motion, no tenderness and no bony tenderness.  Lymphadenopathy:    He has no cervical adenopathy.  Neurological: He is alert and oriented to person, place, and time. No cranial nerve deficit.  Skin: Skin is warm and dry. No rash noted. He is not diaphoretic.  Psychiatric: He has a normal mood and affect. His behavior is normal.  Nursing note and vitals reviewed.   EKG: NSR; diffuse ST changes.      Assessment & Plan:   1. Sore throat   2. Diaphoresis   3. Abnormal thyroid blood test   4. Essential hypertension, benign   5. Nonspecific abnormal electrocardiogram (ECG) (EKG)   6. Palpitations   7. Chronic pain syndrome     1. Sore throat: New. Send throat culture.  Supportive care with rest, fluids, Tylenol.  RTC inability to swallow.  Diaphoresis and malaise and myalgias may be secondary to acute illness but intermittent in nature for past several months. 2. Diaphoresis: New.  Intermittent; occurring  weekly for past few months;  abnormal EKG; refer to cardiology for 2D-echo and stress testing to rule out anginal equivalent. To ED for acute worsening. 3.  Abnormal thyroid testing: New. Repeat today.  Obtain labs. 4.  HTN: improved with restarting medication; obtain labs. 5.  Abnormal EKG: New. Refer to cardiology for risk stratification. 6. Palpitations:  New. Refer to cardiology for 2D-echo; history of MVP by report without recent echo. 7. Chronic pain syndrome: with recent intentional decrease in narcotic use; weaning narcotics may be contributing to diaphoresis and myalgias/arthralgias.   8. Stress reaction: many major stressors occurring currently; moving to [place] in upcoming month; resigned from job yesterday.  Likely contributing to presentation as well.   Meds ordered this encounter  Medications  . potassium chloride SA (K-DUR,KLOR-CON) 20 MEQ tablet    Sig: Take 1 tablet (20 mEq total) by mouth daily.    Dispense:  90 tablet    Refill:  3    No Follow-up on file.   Gardy Montanari Elayne Guerin, M.D. [facility] [address] [place], [place]  [address] [phone] phone [phone] fax

## 2014-12-22 LAB — TSH: TSH: 0.387 u[IU]/mL (ref 0.350–4.500)

## 2014-12-23 LAB — CULTURE, GROUP A STREP: ORGANISM ID, BACTERIA: NORMAL

## 2014-12-25 MED ORDER — POTASSIUM CHLORIDE CRYS ER 20 MEQ PO TBCR: 20.0000 meq | EXTENDED_RELEASE_TABLET | Freq: Every day | ORAL | Status: DC

## 2015-01-01 ENCOUNTER — Ambulatory Visit: Payer: 59 | Admitting: Family Medicine

## 2015-01-16 ENCOUNTER — Telehealth: Payer: Self-pay

## 2015-01-16 MED ORDER — CHLORTHALIDONE 25 MG PO TABS: 25.0000 mg | ORAL_TABLET | Freq: Every day | ORAL | Status: DC

## 2015-01-16 MED ORDER — ATORVASTATIN CALCIUM 10 MG PO TABS: 10.0000 mg | ORAL_TABLET | Freq: Every day | ORAL | Status: DC

## 2015-01-16 MED ORDER — AMLODIPINE BESYLATE 10 MG PO TABS: 10.0000 mg | ORAL_TABLET | Freq: Every day | ORAL | Status: DC

## 2015-01-16 NOTE — Telephone Encounter (Signed)
Spoke with pt, advised Rx's sent in. 

## 2015-01-16 NOTE — Telephone Encounter (Signed)
Pt has recently moved to [facility] and is in need on a rx refill. His insurance requires him to have a 90 day rx. He needs Chlorhalidone, amalodipine and statin?  He will be using the [facility] # [phone] Please call pt with any questions @ [phone]

## 2015-04-05 ENCOUNTER — Other Ambulatory Visit: Payer: Self-pay | Admitting: Family Medicine

## 2017-10-06 ENCOUNTER — Ambulatory Visit: Payer: Managed Care, Other (non HMO) | Attending: Neurosurgery

## 2017-10-06 ENCOUNTER — Other Ambulatory Visit: Payer: Self-pay

## 2017-10-06 DIAGNOSIS — R252 Cramp and spasm: Secondary | ICD-10-CM | POA: Diagnosis present

## 2017-10-06 DIAGNOSIS — M6281 Muscle weakness (generalized): Secondary | ICD-10-CM

## 2017-10-06 DIAGNOSIS — R293 Abnormal posture: Secondary | ICD-10-CM | POA: Diagnosis present

## 2017-10-06 DIAGNOSIS — M542 Cervicalgia: Secondary | ICD-10-CM | POA: Diagnosis present

## 2017-10-06 NOTE — Patient Instructions (Addendum)
PERFORM ALL EXERCISES GENTLY AND WITH GOOD POSTURE.    20 SECOND HOLD, 3 REPS TO EACH SIDE. 4-5 TIMES EACH DAY.   AROM: Neck Rotation   Turn head slowly to look over one shoulder, then the other.   AROM: Neck Flexion   Bend head forward.   AROM: Lateral Neck Flexion   Slowly tilt head toward one shoulder, then the other.    Scapular Retraction (Standing)   With arms at sides, pinch shoulder blades together. Repeat ____ times per set. Do ____ sets per session. Do ____ sessions per day.  http://orth.exer.us/944   Copyright  VHI. All rights reserved.  Chin Protraction / Retraction   Slide head forward keeping chin level. Slide head back, pulling chin in. Hold each position _5__ seconds. Repeat _10__ times. Do _many__ sessions per day.  Casey Burkitt Extension (Chin Tuck)    Pull chin in and lengthen back of neck. Hold _5___ seconds while counting out loud. Repeat __3__ times. Do __many__ sessions per day.  http://gt2.exer.us/449   Copyright  VHI. All rights reserved.    [facility] [address], [place], [place] [address] Phone # [phone] Fax [phone]

## 2017-10-06 NOTE — Therapy (Addendum)
[facility] [address] [place], [place], [address] Phone: [phone]   Fax:  [phone]  Physical Therapy Evaluation  Patient Details  Name: Jake Brown MRN: [id number] Date of Birth: February 23, 1966 Referring Provider: Jovita Gamma, MD   Encounter Date: 10/06/2017  PT End of Session - 10/06/17 1053    Visit Number  1    Date for PT Re-Evaluation  12/01/17    Authorization Type  [employer]    PT Start Time  1016    PT Stop Time  1057    PT Time Calculation (min)  41 min    Activity Tolerance  Patient tolerated treatment well    Behavior During Therapy  [facility] for tasks assessed/performed       Past Medical History:  Diagnosis Date  . Anxiety   . Arthritis   . Cancer of kidney [facility])    right renal carcinoma (nephrectomy )  . Chronic kidney disease   . Degenerative disc disease, cervical   . Degenerative disc disease, lumbar   . Difficult intubation    no problems 01/17/12-stated "small esophagus"  . Heart murmur 1983 noted   no symptoms  . Hyperlipidemia   . Hypertension   . Nephrolithiasis   . PONV (postoperative nausea and vomiting)   . PTSD (post-traumatic stress disorder)     Past Surgical History:  Procedure Laterality Date  . C4-5  surgery  2004  . CYSTOSCOPY W/ URETERAL STENT PLACEMENT  01/17/2012   Procedure: CYSTOSCOPY WITH RETROGRADE PYELOGRAM/URETERAL STENT PLACEMENT;  Surgeon: Hanley Ben, MD;  Location: [facility];  Service: Urology;  Laterality: Left;  . CYSTOSCOPY W/ URETERAL STENT REMOVAL  01/29/2012   Procedure: CYSTOSCOPY WITH STENT REMOVAL;  Surgeon: Franchot Gallo, MD;  Location: [facility];  Service: Urology;  Laterality: Left;     . CYSTOSCOPY WITH URETEROSCOPY  01/29/2012   Procedure: CYSTOSCOPY WITH URETEROSCOPY;  Surgeon: Franchot Gallo, MD;  Location[facility];  Service: Urology;  Laterality: Left;  . LAMINECTOMY AND MICRODISCECTOMY  CERVICAL SPINE  09-16-2006   RIGHT SIDE,  C6 - 7  . [place] WITH MESH AND EXTENSIVE LYSIS ADHESIONS  11-08-2010   RIGHT SUBCOSTAL VENTRAL INCISIONAL HERNIA  S/P RIGHT RADIAL  NEPHRECTOMY  . ORIF FEMUR FX    . TRANSABDOMINAL RIGHT RADICAL NEPHRECTOMY  12-19-1999   LARGE RIGHT RENAL CELL CARCINOMA  . URETERAL REIMPLANTION  CHILD   AND REMOVAL HUTCH DIVERTICULUM    There were no vitals filed for this visit.   Subjective Assessment - 10/06/17 1015    Subjective  Pt presents to PT with complaints of chronic neck pain and Lt UE radiculopathy.  Pt had surgery in 2008 on his neck.  Pt had MRI that showed HNP at C6-7 and nerve conduction study showed entrapment at the Lt elbow.  MD would like to try PT and steriod injection in the neck.      Pertinent History  C6-7 posterior cervical laminotomy, foaminotomy and microdisectomy 08/2006    Limitations  Sitting;Standing;Walking    How long can you sit comfortably?  30 minutes    How long can you stand comfortably?  30 minutes     How long can you walk comfortably?  30 minutes    Diagnostic tests  06/2017: DDD at C6-7 per pt report-MD recommended disc replacement    Patient Stated Goals  improve Lt grip strength, reduce Lt UE radiculopathy, improve posture    Currently  in Pain?  Yes    Pain Score  6     Pain Location  Neck    Pain Orientation  Left    Pain Descriptors / Indicators  Numbness;Aching    Pain Type  Chronic pain    Pain Radiating Towards  Lt UE- numbness in hand    Pain Onset  More than a month ago    Pain Frequency  Constant    Aggravating Factors   sitting, standing, walking, head motion    Pain Relieving Factors  lying down         [facility] PT Assessment - 10/06/17 0001      Assessment   Medical Diagnosis  cervicalgia    Referring Provider  Jovita Gamma, MD    Onset Date/Surgical Date  10/07/15    Hand Dominance  Right    Next MD Visit  a few months    Prior Therapy  none      Precautions    Precautions  Other (comment) history of cancer      Restrictions   Weight Bearing Restrictions  No      Balance Screen   Has the patient fallen in the past 6 months  No    Has the patient had a decrease in activity level because of a fear of falling?   No    Is the patient reluctant to leave their home because of a fear of falling?   No      Home Film/video editor residence      Prior Function   Level of Independence  Independent    Vocation  Full time employment    Vocation Requirements  self-employed- desk work.  Consulting      Cognition   Overall Cognitive Status  Within Functional Limits for tasks assessed      Observation/Other Assessments   Focus on Therapeutic Outcomes (FOTO)   45% limitation      Posture/Postural Control   Posture/Postural Control  Postural limitations    Postural Limitations  Forward head;Rounded Shoulders      ROM / Strength   AROM / PROM / Strength  AROM;PROM;Strength      AROM   Overall AROM   Deficits    Overall AROM Comments  UE A/ROM is limited by 25% with bil shoulder pain at end range    AROM Assessment Site  Cervical    Cervical Flexion  35    Cervical Extension  30    Cervical - Right Side Bend  20    Cervical - Left Side Bend  30    Cervical - Right Rotation  50    Cervical - Left Rotation  60      Strength   Overall Strength  Deficits    Overall Strength Comments  4/5 bil shoulder strength and 4+/5 bil flexion    Strength Assessment Site  Hand    Right/Left hand  Right;Left    Right Hand Grip (lbs)  58    Left Hand Grip (lbs)  34      Palpation   Spinal mobility  reduced PA mobility in the cervical spine C3-7    Palpation comment  pt with trigger points over bil upper traps and cervical paraspinals       Transfers   Transfers  Independent with all Transfers      Ambulation/Gait   Gait Pattern  Within Functional Limits  Objective measurements completed on examination: See above  findings.              PT Education - 10/06/17 1052    Education provided  Yes    Education Details  posture, chin tucks, cervical flexibility, scap squeezes    Person(s) Educated  Patient    Methods  Explanation;Demonstration;Handout    Comprehension  Verbalized understanding;Returned demonstration       PT Short Term Goals - 10/06/17 1225      PT SHORT TERM GOAL #1   Title  be independent in initial HEP    Time  4    Period  Weeks    Status  New    Target Date  11/03/17      PT SHORT TERM GOAL #2   Title  report a 30% reduction in Lt UE radiculopathy with desk work    Time  4    Period  Weeks    Status  New    Target Date  11/03/17        PT Long Term Goals - 10/06/17 1226      PT LONG TERM GOAL #1   Title  be independent in advanced HEP    Time  8    Period  Weeks    Status  New    Target Date  12/01/17      PT LONG TERM GOAL #2   Title  demonstrate Lt grip strength to > or = to 50# to improve use of Lt hand for endurance tasks    Time  8    Period  Weeks    Status  New    Target Date  12/01/17      PT LONG TERM GOAL #3   Title  demonstrate neutral seated posture and report frequent postural corrections with computer use    Time  4    Period  Weeks    Status  New    Target Date  12/01/17      PT LONG TERM GOAL #4   Title  reduce pain to allow for sitting up to 1 hour for desk work without limitation due to pain    Time  4    Period  Weeks    Status  New    Target Date  12/01/17      PT LONG TERM GOAL #5   Title  reporta a 60% reduction in Lt UE radiculopathy with desk work    Time  8    Period  Weeks    Status  New    Target Date  12/01/17             Plan - 10/06/17 1206    Clinical Impression Statement  Pt presents to PT with complaints of neck pain and Lt UE radiculopathy and weakness.  Pt has cervical surgery in 2008.  Recent NCV indicated nerve entrapment in the Lt elbow per pt report.  Pt demonstrates significant postural  abnormality with forward head and rounded shoulders, limited cervical and UE A/ROM, decreased grip on the Lt and Lt numbness.  Pt will benefit from skilled PT for postural strength, cervical A/ROM, traction, dry needling and neural glides in the Lt UE.      History and Personal Factors relevant to plan of care:  cervical surgery in 2008, depression, HTN    Clinical Presentation  Evolving    Clinical Presentation due to:  worsening symptoms over the past 4 months  Clinical Decision Making  Moderate    Rehab Potential  Good    PT Frequency  2x / week    PT Duration  8 weeks    PT Treatment/Interventions  ADLs/Self Care Home Management;Electrical Stimulation;Cryotherapy;Moist Heat;Traction;Ultrasound;Therapeutic exercise;Therapeutic activities;Neuromuscular re-education;Patient/family education;Passive range of motion;Manual techniques;Dry needling;Taping    PT Next Visit Plan  cervical traction, nerve glides for Lt UE, postural strength, kinesiotape.  Pt is working to get new insurance and will go to [employer] for treatment once he does this.      Consulted and Agree with Plan of Care  Patient       Patient will benefit from skilled therapeutic intervention in order to improve the following deficits and impairments:  Decreased activity tolerance, Decreased endurance, Decreased range of motion, Decreased strength, Impaired flexibility, Increased muscle spasms, Impaired UE functional use, Postural dysfunction, Pain, Improper body mechanics  Visit Diagnosis: Cervicalgia - Plan: PT plan of care cert/re-cert  Cramp and spasm - Plan: PT plan of care cert/re-cert  Abnormal posture - Plan: PT plan of care cert/re-cert  Muscle weakness (generalized) - Plan: PT plan of care cert/re-cert     Problem List Patient Active Problem List   Diagnosis Date Noted  . Chronic pain syndrome 02/08/2014     Sigurd Sos, PT 10/06/17 12:33 PM PHYSICAL THERAPY DISCHARGE SUMMARY  Visits from Start of Care:  1  Current functional level related to goals / functional outcomes: Pt didn't return to PT after initial evaluation.     Remaining deficits: See above for most current status.     Education / Equipment: HEP Plan: Patient agrees to discharge.  Patient goals were not met. Patient is being discharged due to not returning since the last visit.  ?????        Sigurd Sos, PT 11/16/17 8:44 AM  [facility] [address], [place], [place], [address] Phone: [phone]   Fax:  [phone]  Name: Jake Brown MRN: [id number] Date of Birth: 20-Nov-1965

## 2017-10-15 ENCOUNTER — Other Ambulatory Visit: Payer: Self-pay

## 2017-10-15 ENCOUNTER — Observation Stay
Admission: EM | Admit: 2017-10-15 | Discharge: 2017-10-16 | Disposition: A | Discharge status: Home Health | Payer: Managed Care, Other (non HMO) | Attending: Internal Medicine | Admitting: Internal Medicine

## 2017-10-15 ENCOUNTER — Emergency Department: Payer: Managed Care, Other (non HMO)

## 2017-10-15 ENCOUNTER — Inpatient Hospital Stay (HOSPITAL_COMMUNITY)
Admit: 2017-10-15 | Discharge: 2017-10-15 | Disposition: A | Discharge status: Home / Self Care | Payer: Managed Care, Other (non HMO) | Attending: Internal Medicine | Admitting: Internal Medicine

## 2017-10-15 ENCOUNTER — Inpatient Hospital Stay: Payer: Managed Care, Other (non HMO)

## 2017-10-15 DIAGNOSIS — Z7982 Long term (current) use of aspirin: Secondary | ICD-10-CM | POA: Insufficient documentation

## 2017-10-15 DIAGNOSIS — Z905 Acquired absence of kidney: Secondary | ICD-10-CM | POA: Insufficient documentation

## 2017-10-15 DIAGNOSIS — N183 Chronic kidney disease, stage 3 (moderate): Secondary | ICD-10-CM | POA: Insufficient documentation

## 2017-10-15 DIAGNOSIS — Z87891 Personal history of nicotine dependence: Secondary | ICD-10-CM | POA: Diagnosis not present

## 2017-10-15 DIAGNOSIS — E876 Hypokalemia: Secondary | ICD-10-CM | POA: Diagnosis not present

## 2017-10-15 DIAGNOSIS — I129 Hypertensive chronic kidney disease with stage 1 through stage 4 chronic kidney disease, or unspecified chronic kidney disease: Secondary | ICD-10-CM | POA: Diagnosis not present

## 2017-10-15 DIAGNOSIS — G8194 Hemiplegia, unspecified affecting left nondominant side: Secondary | ICD-10-CM | POA: Insufficient documentation

## 2017-10-15 DIAGNOSIS — Z79899 Other long term (current) drug therapy: Secondary | ICD-10-CM | POA: Diagnosis not present

## 2017-10-15 DIAGNOSIS — I639 Cerebral infarction, unspecified: Secondary | ICD-10-CM | POA: Diagnosis not present

## 2017-10-15 DIAGNOSIS — Z85528 Personal history of other malignant neoplasm of kidney: Secondary | ICD-10-CM | POA: Diagnosis not present

## 2017-10-15 DIAGNOSIS — E785 Hyperlipidemia, unspecified: Secondary | ICD-10-CM | POA: Diagnosis not present

## 2017-10-15 DIAGNOSIS — I161 Hypertensive emergency: Secondary | ICD-10-CM | POA: Insufficient documentation

## 2017-10-15 DIAGNOSIS — Z6841 Body Mass Index (BMI) 40.0 and over, adult: Secondary | ICD-10-CM | POA: Diagnosis not present

## 2017-10-15 DIAGNOSIS — I6381 Other cerebral infarction due to occlusion or stenosis of small artery: Secondary | ICD-10-CM

## 2017-10-15 HISTORY — DX: Unspecified dementia, unspecified severity, without behavioral disturbance, psychotic disturbance, mood disturbance, and anxiety: F03.90

## 2017-10-15 LAB — CBC
HEMATOCRIT: 48.5 % (ref 40.0–52.0)
HEMOGLOBIN: 16.6 g/dL (ref 13.0–18.0)
MCH: 29.9 pg (ref 26.0–34.0)
MCHC: 34.3 g/dL (ref 32.0–36.0)
MCV: 87.4 fL (ref 80.0–100.0)
Platelets: 263 10*3/uL (ref 150–440)
RBC: 5.55 MIL/uL (ref 4.40–5.90)
RDW: 14.4 % (ref 11.5–14.5)
WBC: 10.7 10*3/uL — ABNORMAL HIGH (ref 3.8–10.6)

## 2017-10-15 LAB — PROTIME-INR
INR: 0.92
Prothrombin Time: 12.3 seconds (ref 11.4–15.2)

## 2017-10-15 LAB — COMPREHENSIVE METABOLIC PANEL
ALBUMIN: 4.1 g/dL (ref 3.5–5.0)
ALK PHOS: 83 U/L (ref 38–126)
ALT: 20 U/L (ref 17–63)
AST: 25 U/L (ref 15–41)
Anion gap: 13 (ref 5–15)
BILIRUBIN TOTAL: 0.8 mg/dL (ref 0.3–1.2)
BUN: 18 mg/dL (ref 6–20)
CALCIUM: 9.2 mg/dL (ref 8.9–10.3)
CO2: 24 mmol/L (ref 22–32)
CREATININE: 1.85 mg/dL — AB (ref 0.61–1.24)
Chloride: 102 mmol/L (ref 101–111)
GFR calc Af Amer: 46 mL/min — ABNORMAL LOW (ref >60)
GFR, EST NON AFRICAN AMERICAN: 40 mL/min — AB (ref >60)
GLUCOSE: 134 mg/dL — AB (ref 65–99)
POTASSIUM: 3.3 mmol/L — AB (ref 3.5–5.1)
Sodium: 139 mmol/L (ref 135–145)
TOTAL PROTEIN: 7.8 g/dL (ref 6.5–8.1)

## 2017-10-15 LAB — MAGNESIUM: Magnesium: 2 mg/dL (ref 1.7–2.4)

## 2017-10-15 LAB — ETHANOL

## 2017-10-15 LAB — GLUCOSE, CAPILLARY: Glucose-Capillary: 139 mg/dL — ABNORMAL HIGH (ref 65–99)

## 2017-10-15 LAB — DIFFERENTIAL
BASOS ABS: 0.1 10*3/uL (ref 0–0.1)
Basophils Relative: 1 %
Eosinophils Absolute: 0.2 10*3/uL (ref 0–0.7)
Eosinophils Relative: 2 %
LYMPHS ABS: 2.2 10*3/uL (ref 1.0–3.6)
Lymphocytes Relative: 21 %
MONOS PCT: 7 %
Monocytes Absolute: 0.7 10*3/uL (ref 0.2–1.0)
NEUTROS ABS: 7.5 10*3/uL — AB (ref 1.4–6.5)
NEUTROS PCT: 69 %

## 2017-10-15 LAB — TROPONIN I: Troponin I: <0.03 ng/mL (ref <0.03)

## 2017-10-15 LAB — APTT: APTT: 29 s (ref 24–36)

## 2017-10-15 MED ORDER — SODIUM CHLORIDE 0.9 % IV SOLN
INTRAVENOUS | Status: DC
  Administered 2017-10-15 – 2017-10-16 (×2): via INTRAVENOUS

## 2017-10-15 MED ORDER — BISACODYL 5 MG PO TBEC: 5.0000 mg | DELAYED_RELEASE_TABLET | Freq: Every day | ORAL | Status: DC | PRN

## 2017-10-15 MED ORDER — SODIUM CHLORIDE 0.9 % IV BOLUS
1000.0000 mL | Freq: Once | INTRAVENOUS | Status: AC
  Administered 2017-10-15: 1000 mL via INTRAVENOUS

## 2017-10-15 MED ORDER — ACETAMINOPHEN 160 MG/5ML PO SOLN
650.0000 mg | ORAL | Status: DC | PRN
  Filled 2017-10-15: qty 20.3

## 2017-10-15 MED ORDER — HYDRALAZINE HCL 20 MG/ML IJ SOLN
10.0000 mg | Freq: Four times a day (QID) | INTRAMUSCULAR | Status: DC | PRN
  Administered 2017-10-15: 10 mg via INTRAVENOUS
  Filled 2017-10-15: qty 1

## 2017-10-15 MED ORDER — HEPARIN SODIUM (PORCINE) 5000 UNIT/ML IJ SOLN
5000.0000 [IU] | Freq: Three times a day (TID) | INTRAMUSCULAR | Status: DC
  Administered 2017-10-15 – 2017-10-16 (×2): 5000 [IU] via SUBCUTANEOUS
  Filled 2017-10-15 (×2): qty 1

## 2017-10-15 MED ORDER — FENTANYL 12 MCG/HR TD PT72: 12.5000 ug | MEDICATED_PATCH | TRANSDERMAL | Status: DC

## 2017-10-15 MED ORDER — MORPHINE SULFATE (PF) 4 MG/ML IV SOLN
4.0000 mg | INTRAVENOUS | Status: DC | PRN
Start: 2017-10-15 — End: 2017-10-16

## 2017-10-15 MED ORDER — SENNOSIDES-DOCUSATE SODIUM 8.6-50 MG PO TABS: 1.0000 | ORAL_TABLET | Freq: Every evening | ORAL | Status: DC | PRN

## 2017-10-15 MED ORDER — ACETAMINOPHEN 325 MG PO TABS: 650.0000 mg | ORAL_TABLET | ORAL | Status: DC | PRN

## 2017-10-15 MED ORDER — LORAZEPAM 2 MG/ML IJ SOLN
1.0000 mg | Freq: Once | INTRAMUSCULAR | Status: DC
Start: 2017-10-15 — End: 2017-10-16

## 2017-10-15 MED ORDER — ASPIRIN 81 MG PO CHEW
324.0000 mg | CHEWABLE_TABLET | Freq: Once | ORAL | Status: AC
  Administered 2017-10-15: 324 mg via ORAL
  Filled 2017-10-15: qty 4

## 2017-10-15 MED ORDER — POTASSIUM CHLORIDE CRYS ER 20 MEQ PO TBCR
40.0000 meq | EXTENDED_RELEASE_TABLET | Freq: Once | ORAL | Status: AC
  Administered 2017-10-15: 21:00:00 40 meq via ORAL
  Filled 2017-10-15: qty 2

## 2017-10-15 MED ORDER — ONDANSETRON HCL 4 MG/2ML IJ SOLN: 4.0000 mg | Freq: Four times a day (QID) | INTRAMUSCULAR | Status: DC | PRN

## 2017-10-15 MED ORDER — ACETAMINOPHEN 650 MG RE SUPP: 650.0000 mg | RECTAL | Status: DC | PRN

## 2017-10-15 MED ORDER — OXYCODONE HCL 5 MG PO TABS
5.0000 mg | ORAL_TABLET | Freq: Four times a day (QID) | ORAL | Status: DC | PRN
Start: 2017-10-15 — End: 2017-10-16
  Administered 2017-10-15: 5 mg via ORAL
  Filled 2017-10-15: qty 1

## 2017-10-15 MED ORDER — METOPROLOL TARTRATE 5 MG/5ML IV SOLN
10.0000 mg | Freq: Once | INTRAVENOUS | Status: AC
  Administered 2017-10-15: 10 mg via INTRAVENOUS
  Filled 2017-10-15: qty 10

## 2017-10-15 MED ORDER — OXYCODONE-ACETAMINOPHEN 5-325 MG PO TABS
1.0000 | ORAL_TABLET | Freq: Four times a day (QID) | ORAL | Status: DC | PRN
  Administered 2017-10-15 – 2017-10-16 (×2): 1 via ORAL
  Filled 2017-10-15 (×2): qty 1

## 2017-10-15 MED ORDER — ASPIRIN 300 MG RE SUPP: 300.0000 mg | Freq: Every day | RECTAL | Status: DC

## 2017-10-15 MED ORDER — ACETAMINOPHEN 325 MG PO TABS: 650.0000 mg | ORAL_TABLET | Freq: Four times a day (QID) | ORAL | Status: DC | PRN

## 2017-10-15 MED ORDER — OXYCODONE-ACETAMINOPHEN 10-325 MG PO TABS: 1.0000 | ORAL_TABLET | Freq: Four times a day (QID) | ORAL | Status: DC | PRN

## 2017-10-15 MED ORDER — STROKE: EARLY STAGES OF RECOVERY BOOK
Freq: Once | Status: AC
  Administered 2017-10-15: 21:00:00

## 2017-10-15 MED ORDER — ATORVASTATIN CALCIUM 20 MG PO TABS
40.0000 mg | ORAL_TABLET | Freq: Every day | ORAL | Status: DC
  Administered 2017-10-15: 21:00:00 40 mg via ORAL
  Filled 2017-10-15: qty 2

## 2017-10-15 MED ORDER — METHOCARBAMOL 750 MG PO TABS
750.0000 mg | ORAL_TABLET | Freq: Three times a day (TID) | ORAL | Status: DC | PRN
  Filled 2017-10-15: qty 1

## 2017-10-15 MED ORDER — LORAZEPAM 2 MG/ML IJ SOLN
0.5000 mg | Freq: Once | INTRAMUSCULAR | Status: AC
  Administered 2017-10-15: 0.5 mg via INTRAVENOUS
  Filled 2017-10-15: qty 1

## 2017-10-15 MED ORDER — AMLODIPINE BESYLATE 10 MG PO TABS
10.0000 mg | ORAL_TABLET | Freq: Every day | ORAL | Status: DC
  Administered 2017-10-16: 10 mg via ORAL
  Filled 2017-10-15: qty 1

## 2017-10-15 MED ORDER — ASPIRIN 325 MG PO TABS
325.0000 mg | ORAL_TABLET | Freq: Every day | ORAL | Status: DC
  Administered 2017-10-16: 10:00:00 325 mg via ORAL
  Filled 2017-10-15: qty 1

## 2017-10-15 MED ORDER — METOPROLOL TARTRATE 25 MG PO TABS
25.0000 mg | ORAL_TABLET | Freq: Two times a day (BID) | ORAL | Status: DC
  Administered 2017-10-15 – 2017-10-16 (×2): 25 mg via ORAL
  Filled 2017-10-15 (×2): qty 1

## 2017-10-15 NOTE — ED Triage Notes (Signed)
Pt c/o left arm and leg weakness with numbess since yesterday evening around 6pm. States he has been having difficulty walking. Denies any numbness to the face, No droop noted upon arrival, states the sx got worse today.Marland Kitchen

## 2017-10-15 NOTE — ED Provider Notes (Signed)
[facility] Emergency Department Provider Note  ____________________________________________  Time seen: Approximately 3:25 PM  I have reviewed the triage vital signs and the nursing notes.   HISTORY  Chief Complaint Code Stroke    HPI Jake Brown is a 52 y.o. male with a history of renal cell carcinoma status post right nephrectomy remotely, HTN, HL, remote drug abuse(last used cocaine 1992) presenting with left-sided weakness.  The patient reports that yesterday in the afternoon, he went to walk his dog and noticed that his left leg was weak and that it was causing him to "shuffle."  This morning, he walked his dog twice and both times noticed that he was continuing to have shuffling with left leg weakness, and then noticed left upper extremity weakness with numbness and tingling in the left arm and leg.  He denies any headache or visual changes.  No chest pain, shortness of breath, lightheadedness or syncope.  Upon examination, the patient does have 2 fentanyl patches when he states that he normally only wears 1, "I do not know how that happened."  He placed the second fentanyl patch on his chest around 6am yesterday.    Past Medical History:  Diagnosis Date  . Anxiety   . Arthritis   . Cancer of kidney [facility])    right renal carcinoma (nephrectomy )  . Chronic kidney disease   . Degenerative disc disease, cervical   . Degenerative disc disease, lumbar   . Difficult intubation    no problems 01/17/12-stated "small esophagus"  . Heart murmur 1983 noted   no symptoms  . Hyperlipidemia   . Hypertension   . Nephrolithiasis   . PONV (postoperative nausea and vomiting)   . PTSD (post-traumatic stress disorder)     Patient Active Problem List   Diagnosis Date Noted  . Chronic pain syndrome 02/08/2014    Past Surgical History:  Procedure Laterality Date  . C4-5  surgery  2004  . CYSTOSCOPY W/ URETERAL STENT PLACEMENT  01/17/2012   Procedure:  CYSTOSCOPY WITH RETROGRADE PYELOGRAM/URETERAL STENT PLACEMENT;  Surgeon: Hanley Ben, MD;  Location: [facility];  Service: Urology;  Laterality: Left;  . CYSTOSCOPY W/ URETERAL STENT REMOVAL  01/29/2012   Procedure: CYSTOSCOPY WITH STENT REMOVAL;  Surgeon: Franchot Gallo, MD;  Location[facility];  Service: Urology;  Laterality: Left;     . CYSTOSCOPY WITH URETEROSCOPY  01/29/2012   Procedure: CYSTOSCOPY WITH URETEROSCOPY;  Surgeon: Franchot Gallo, MD;  Location: [facility];  Service: Urology;  Laterality: Left;  . LAMINECTOMY AND MICRODISCECTOMY CERVICAL SPINE  09-16-2006   RIGHT SIDE,  C6 - 7  . [place] WITH MESH AND EXTENSIVE LYSIS ADHESIONS  11-08-2010   RIGHT SUBCOSTAL VENTRAL INCISIONAL HERNIA  S/P RIGHT RADIAL  NEPHRECTOMY  . ORIF FEMUR FX    . TRANSABDOMINAL RIGHT RADICAL NEPHRECTOMY  12-19-1999   LARGE RIGHT RENAL CELL CARCINOMA  . URETERAL REIMPLANTION  CHILD   AND REMOVAL HUTCH DIVERTICULUM    Current Outpatient Rx  . Order #: [id number] Class: Normal  . Order #: [id number] Class: Normal  . Order #: [id number] Class: Normal  . Order #: [id number] Class: Historical Med  . Order #: [id number] Class: Historical Med  . Order #: [id number] Class: Historical Med    Allergies Patient has no known allergies.  Family History  Problem Relation Age of Onset  . Hypertension Mother   . Arthritis Mother   . Hyperlipidemia Father   . Cancer Father 41  bladder cancer    Social History Social History   Tobacco Use  . Smoking status: Former Smoker    Years: 20.00    Last attempt to quit: 07/21/1993    Years since quitting: 24.2  . Smokeless tobacco: Never Used  Substance Use Topics  . Alcohol use: No  . Drug use: No    Review of Systems Constitutional: No fever/chills.  No lightheadedness or syncope. Eyes: No visual changes.  No blurred or double vision. ENT: No sore throat. No congestion or rhinorrhea. Cardiovascular:  Denies chest pain. Denies palpitations. Respiratory: Denies shortness of breath.  No cough. Gastrointestinal: No abdominal pain.  No nausea, no vomiting.  No diarrhea.  No constipation. Genitourinary: Negative for dysuria. Musculoskeletal: Negative for back pain. Skin: Negative for rash. Neurological: Negative for headaches.  Positive for left upper extremity and left lower extremity weakness, numbness and tingling.  Positive difficulty walking due to left lower extremity weakness.  No visual or speech changes.  No mental status changes.  Psychiatric:Positive anxiety. ____________________________________________   PHYSICAL EXAM:  VITAL SIGNS: ED Triage Vitals  Enc Vitals Group     BP 10/15/17 1435 (!) 165/136     Pulse Rate 10/15/17 1435 (!) 123     Resp 10/15/17 1435 18     Temp 10/15/17 1435 (!) 97.4 F (36.3 C)     Temp Source 10/15/17 1435 Oral     SpO2 10/15/17 1435 96 %     Weight 10/15/17 1437 275 lb (124.7 kg)     Height 10/15/17 1437 5\' 9"  (1.753 m)     Head Circumference --      Peak Flow --      Pain Score 10/15/17 1436 0     Pain Loc --      Pain Edu? --      Excl. in [place]? --     Constitutional: Alert and oriented.  Answers questions appropriately.  The patient is occasionally tearful and anxious.  He is comfortable appearing and in no acute distress. Eyes: Conjunctivae are normal.  EOMI. PERRLA.  No vertical or horizontal nystagmus.  No scleral icterus. Head: Atraumatic. Nose: No congestion/rhinnorhea. Mouth/Throat: Mucous membranes are mildly dry.  Neck: No stridor.  Supple.  No JVD.  No meningismus. Cardiovascular: Normal rate, regular rhythm. No murmurs, rubs or gallops.  Respiratory: Normal respiratory effort.  No accessory muscle use or retractions. Lungs CTAB.  No wheezes, rales or ronchi. Gastrointestinal: Obese.  Soft, nontender and nondistended.  No guarding or rebound.  No peritoneal signs. Musculoskeletal: No LE edema. No ttp in the calves or palpable  cords.  Negative Homan's sign. Neurologic: Alert and oriented 3. Speech is somewhat slurred although the patient states it is normal for him. Face and smile symmetric. Tongue is midline.  EOMI.  PERRLA.  No vertical or horizontal nystagmus.  Positive left pronator drift. 5 out of 5 grip, biceps, triceps, right hip flexor, plantar flexion and dorsiflexion.  4+ out of 5 left hip flexor strength.  Normal sensation to light touch in the bilateral upper and lower extremities, and face. Normal heel-to-shin. Skin:  Skin is warm, dry and intact. No rash noted. Psychiatric: Mood is depressed and affect is anxious  ____________________________________________   LABS (all labs ordered are listed, but only abnormal results are displayed)  Labs Reviewed  CBC - Abnormal; Notable for the following components:      Result Value   WBC 10.7 (*)    All other components within normal limits  DIFFERENTIAL - Abnormal; Notable for the following components:   Neutro Abs 7.5 (*)    All other components within normal limits  GLUCOSE, CAPILLARY - Abnormal; Notable for the following components:   Glucose-Capillary 139 (*)    All other components within normal limits  PROTIME-INR  APTT  COMPREHENSIVE METABOLIC PANEL  TROPONIN I  URINE DRUG SCREEN, QUALITATIVE ([facility] ONLY)  ETHANOL  CBG MONITORING, ED   ____________________________________________  EKG  ED ECG REPORT I, Eula Listen, the attending physician, personally viewed and interpreted this ECG.   Date: 10/15/2017  EKG Time: 1501  Rate: 112  Rhythm: sinus tachycardia  Axis: normal  Intervals:prolonged QTc  ST&T Change: no STEMI  ____________________________________________  RADIOLOGY  Ct Head Code Stroke Wo Contrast  Result Date: 10/15/2017 CLINICAL DATA:  Code stroke. LEFT arm and leg weakness and numbness. This began yesterday at 6 p.m. EXAM: CT HEAD WITHOUT CONTRAST TECHNIQUE: Contiguous axial images were obtained from the base  of the skull through the vertex without intravenous contrast. COMPARISON:  No prior CT head for comparison. FINDINGS: Brain: Premature for age atrophy. Extensive hypoattenuation of white matter, also advanced for age, consistent small vessel disease. Well-defined ovoid deep white matter lacunar infarct, RIGHT centrum semiovale, consistent with a chronic insult. Asymmetric hypoattenuation of the RIGHT external capsule, felt to represent small vessel disease. Large chronic lacunar infarct of the brainstem, LEFT greater than RIGHT paramedian pons. Indeterminate age 17 mm focus of hypoattenuation RIGHT thalamus. This does not clearly involve the posterior limb internal capsule. This could represent an acute lacunar infarct contributing to LEFT-sided sensory symptoms, but would not be expected to cause weakness. Vascular: Subtle hyperattenuation of the supraclinoid ICA and M1 MCA on the RIGHT could represent emergent large vessel occlusion. CTA recommended. Calcific intracranial atherosclerosis. Dolichoectasia. Skull: Calvarium intact. Sinuses/Orbits: Negative sinuses and orbits. Other: None ASPECTS ([place] Stroke Program Early CT Score) - Ganglionic level infarction (caudate, lentiform nuclei, internal capsule, insula, M1-M3 cortex): 7 (thalamus does not contribute to aspects scoring - Supraganglionic infarction (M4-M6 cortex): 3 Total score (0-10 with 10 being normal): 10 IMPRESSION: 1. Possible acute RIGHT thalamic lacunar infarct. Subtle asymmetric hyperattenuation of the RIGHT ICA and M1 MCA. MRI brain and CTA head neck recommended. Premature for age atrophy and white matter disease. Chronic RIGHT centrum semiovale lacunar infarct. Chronic brainstem infarct. 2. ASPECTS is 10. These results were called by telephone at the time of interpretation on 10/15/2017 at 2:58 pm to EDP, who verbally acknowledged these results. Electronically Signed   By: Staci Righter M.D.   On: 10/15/2017 15:02     ____________________________________________   PROCEDURES  Procedure(s) performed: None  Procedures  Critical Care performed: Yes, see critical care note(s) ____________________________________________   INITIAL IMPRESSION / ASSESSMENT AND PLAN / ED COURSE  Pertinent labs & imaging results that were available during my care of the patient were reviewed by me and considered in my medical decision making (see chart for details).  52 y.o. male who is grossly hypertensive in the 190s over 130s in the emergency department presenting with greater than 24 hours of left upper and left lower extremity weakness, numbness and tingling.  Overall, I am concerned about hypertensive emergency versus acute CVA.  Other products will etiologies include overuse of fentanyl patch or other toxicity, and much less likely an infectious process although the patient is not giving any history that would be suggestive of this.  From a neurologic perspective, the patient's CT does show a possible acute right thalamic  lunar lacunar infarct with some asymmetry in and hyper attenuation of the right ICA and M1.  He has also old infarcts.  At this time, the patient is not a candidate for TPA either IV or intra-arterial, and I have discussed this with him.  He has also been evaluated by Dr. Doy Mince.  At this time, we will immediately begin management of his extreme hypertension, and initiate a full stroke workup as an inpatient including CT angiogram of the head and neck if his creatinine is normal as he only has one kidney, and MRI of the brain.  Aspirin has been ordered.  The patient will be admitted.  He understands the results of his studies and the plan at this time.  CRITICAL CARE Performed by: Eula Listen   Total critical care time: 45 minutes  Critical care time was exclusive of separately billable procedures and treating other patients.  Critical care was necessary to treat or prevent imminent  or life-threatening deterioration.  Critical care was time spent personally by me on the following activities: development of treatment plan with patient and/or surrogate as well as nursing, discussions with consultants, evaluation of patient's response to treatment, examination of patient, obtaining history from patient or surrogate, ordering and performing treatments and interventions, ordering and review of laboratory studies, ordering and review of radiographic studies, pulse oximetry and re-evaluation of patient's condition.   ____________________________________________  FINAL CLINICAL IMPRESSION(S) / ED DIAGNOSES  Final diagnoses:  Right thalamic infarction ([place])  Acute CVA (cerebrovascular accident) [facility])  Hypertensive emergency         NEW MEDICATIONS STARTED DURING THIS VISIT:  New Prescriptions   No medications on file      Eula Listen, MD 10/15/17 [phone]

## 2017-10-15 NOTE — H&P (Signed)
[facility] NAME: Jake Brown    MR#:  [id number]  DATE OF BIRTH:  06-29-1966  DATE OF ADMISSION:  10/15/2017  PRIMARY CARE PHYSICIAN: Patient, No Pcp Per   REQUESTING/REFERRING PHYSICIAN: Dr. Mariea Clonts.  CHIEF COMPLAINT:   Chief Complaint  Patient presents with  . Code Stroke   Left-sided weakness since yesterday. HISTORY OF PRESENT ILLNESS:  Jake Brown  is a 52 y.o. male with a known history of hypertension, hyperlipidemia, CKD, kidney cancer and anxiety.  The patient presented to ED with above chief complaints.  The patient feels left arm and leg weakness yesterday.  He did not pay any attention but he has worsening left leg weakness with numbness and tingling since this morning.  He has any slurred speech, dysphagia or incontinence.  Was found high blood pressure at 200/121 in the ED.  CAT scan of the head showed possible acute right thalamic lunar lacunar infarct.  He is treated with aspirin in the ED.  He was evaluated by Dr. Kathlen Brunswick, neurologist in the ED.  He has he has only one kidney.  PAST MEDICAL HISTORY:   Past Medical History:  Diagnosis Date  . Anxiety   . Arthritis   . Cancer of kidney [facility])    right renal carcinoma (nephrectomy )  . Chronic kidney disease   . Degenerative disc disease, cervical   . Degenerative disc disease, lumbar   . Difficult intubation    no problems 01/17/12-stated "small esophagus"  . Heart murmur 1983 noted   no symptoms  . Hyperlipidemia   . Hypertension   . Nephrolithiasis   . PONV (postoperative nausea and vomiting)   . PTSD (post-traumatic stress disorder)     PAST SURGICAL HISTORY:   Past Surgical History:  Procedure Laterality Date  . C4-5  surgery  2004  . CYSTOSCOPY W/ URETERAL STENT PLACEMENT  01/17/2012   Procedure: CYSTOSCOPY WITH RETROGRADE PYELOGRAM/URETERAL STENT PLACEMENT;  Surgeon: Hanley Ben, MD;  Location: [facility];  Service: Urology;   Laterality: Left;  . CYSTOSCOPY W/ URETERAL STENT REMOVAL  01/29/2012   Procedure: CYSTOSCOPY WITH STENT REMOVAL;  Surgeon: Franchot Gallo, MD;  [facility];  Service: Urology;  Laterality: Left;     . CYSTOSCOPY WITH URETEROSCOPY  01/29/2012   Procedure: CYSTOSCOPY WITH URETEROSCOPY;  Surgeon: Franchot Gallo, MD;  Location: [facility];  Service: Urology;  Laterality: Left;  . LAMINECTOMY AND MICRODISCECTOMY CERVICAL SPINE  09-16-2006   RIGHT SIDE,  C6 - 7  . [place] WITH MESH AND EXTENSIVE LYSIS ADHESIONS  11-08-2010   RIGHT SUBCOSTAL VENTRAL INCISIONAL HERNIA  S/P RIGHT RADIAL  NEPHRECTOMY  . ORIF FEMUR FX    . TRANSABDOMINAL RIGHT RADICAL NEPHRECTOMY  12-19-1999   LARGE RIGHT RENAL CELL CARCINOMA  . URETERAL REIMPLANTION  CHILD   AND REMOVAL HUTCH DIVERTICULUM    SOCIAL HISTORY:   Social History   Tobacco Use  . Smoking status: Former Smoker    Years: 20.00    Last attempt to quit: 07/21/1993    Years since quitting: 24.2  . Smokeless tobacco: Never Used  Substance Use Topics  . Alcohol use: No    FAMILY HISTORY:   Family History  Problem Relation Age of Onset  . Hypertension Mother   . Arthritis Mother   . Hyperlipidemia Father   . Cancer Father 80       bladder cancer    DRUG  ALLERGIES:  No Known Allergies  REVIEW OF SYSTEMS:   Review of Systems  Constitutional: Negative for chills, fever and malaise/fatigue.  HENT: Negative for sore throat.   Eyes: Negative for blurred vision and double vision.  Respiratory: Negative for cough, hemoptysis, shortness of breath, wheezing and stridor.   Cardiovascular: Negative for chest pain, palpitations, orthopnea and leg swelling.  Gastrointestinal: Negative for abdominal pain, blood in stool, diarrhea, melena, nausea and vomiting.  Genitourinary: Negative for dysuria, flank pain and hematuria.  Musculoskeletal: Negative for back pain and joint pain.    Skin: Negative for rash.  Neurological: Positive for tingling, sensory change and focal weakness. Negative for dizziness, seizures, loss of consciousness, weakness and headaches.  Endo/Heme/Allergies: Negative for polydipsia.  Psychiatric/Behavioral: Negative for depression. The patient is not nervous/anxious.     MEDICATIONS AT HOME:   Prior to Admission medications   Medication Sig Start Date End Date Taking? Authorizing Provider  amLODipine (NORVASC) 10 MG tablet TAKE 1 TABLET (10 MG TOTAL) BY MOUTH DAILY. 04/06/15  Yes Wardell Honour, MD  atorvastatin (LIPITOR) 10 MG tablet Take 1 tablet (10 mg total) by mouth daily. PATIENT NEEDS OFFICE VISIT/FASTING LABS FOR ADDITIONAL REFILLS 04/06/15  Yes Wardell Honour, MD  chlorthalidone (HYGROTON) 25 MG tablet TAKE 1 TABLET (25 MG TOTAL) BY MOUTH DAILY. 04/06/15  Yes Wardell Honour, MD  fentaNYL (DURAGESIC - DOSED MCG/HR) 12 MCG/HR Place 12.5 mcg onto the skin every 3 (three) days.   Yes [provider]  methocarbamol (ROBAXIN) 750 MG tablet Take 750 mg by mouth 3 (three) times daily as needed for muscle spasms.    Yes [provider]  oxyCODONE-acetaminophen (PERCOCET) 10-325 MG tablet Take 1 tablet by mouth every 6 (six) hours as needed for pain.   Yes [provider]      VITAL SIGNS:  Blood pressure (!) 162/83, pulse 73, temperature (!) 97.4 F (36.3 C), resp. rate (!) 28, height 5\' 9"  (1.753 m), weight 275 lb (124.7 kg), SpO2 92 %.  PHYSICAL EXAMINATION:  Physical Exam  GENERAL:  52 y.o.-year-old patient lying in the bed with no acute distress.  Obese obesity. EYES: Pupils equal, round, reactive to light and accommodation. No scleral icterus. Extraocular muscles intact.  HEENT: Head atraumatic, normocephalic. Oropharynx and nasopharynx clear.  NECK:  Supple, no jugular venous distention. No thyroid enlargement, no tenderness.  LUNGS: Normal breath sounds bilaterally, no wheezing, rales,rhonchi or crepitation.  No use of accessory muscles of respiration.  CARDIOVASCULAR: S1, S2 normal. No murmurs, rubs, or gallops.  ABDOMEN: Soft, nontender, nondistended. Bowel sounds present. No organomegaly or mass.  EXTREMITIES: No pedal edema, cyanosis, or clubbing.  NEUROLOGIC: Cranial nerves II through XII are intact. Muscle strength 4/5 in left and 5/5 in right extremities. Sensation intact. Gait not checked.  PSYCHIATRIC: The patient is alert and oriented x 3.  SKIN: No obvious rash, lesion, or ulcer.   LABORATORY PANEL:   CBC Recent Labs  Lab 10/15/17 1457  WBC 10.7*  HGB 16.6  HCT 48.5  PLT 263   ------------------------------------------------------------------------------------------------------------------  Chemistries  Recent Labs  Lab 10/15/17 1457  NA 139  K 3.3*  CL 102  CO2 24  GLUCOSE 134*  BUN 18  CREATININE 1.85*  CALCIUM 9.2  AST 25  ALT 20  ALKPHOS 83  BILITOT 0.8   ------------------------------------------------------------------------------------------------------------------  Cardiac Enzymes Recent Labs  Lab 10/15/17 1457  TROPONINI <0.03   ------------------------------------------------------------------------------------------------------------------  RADIOLOGY:  Ct Head Code Stroke Wo Contrast  Result  Date: 10/15/2017 CLINICAL DATA:  Code stroke. LEFT arm and leg weakness and numbness. This began yesterday at 6 p.m. EXAM: CT HEAD WITHOUT CONTRAST TECHNIQUE: Contiguous axial images were obtained from the base of the skull through the vertex without intravenous contrast. COMPARISON:  No prior CT head for comparison. FINDINGS: Brain: Premature for age atrophy. Extensive hypoattenuation of white matter, also advanced for age, consistent small vessel disease. Well-defined ovoid deep white matter lacunar infarct, RIGHT centrum semiovale, consistent with a chronic insult. Asymmetric hypoattenuation of the RIGHT external capsule, felt to represent small vessel  disease. Large chronic lacunar infarct of the brainstem, LEFT greater than RIGHT paramedian pons. Indeterminate age 33 mm focus of hypoattenuation RIGHT thalamus. This does not clearly involve the posterior limb internal capsule. This could represent an acute lacunar infarct contributing to LEFT-sided sensory symptoms, but would not be expected to cause weakness. Vascular: Subtle hyperattenuation of the supraclinoid ICA and M1 MCA on the RIGHT could represent emergent large vessel occlusion. CTA recommended. Calcific intracranial atherosclerosis. Dolichoectasia. Skull: Calvarium intact. Sinuses/Orbits: Negative sinuses and orbits. Other: None ASPECTS ([place] Stroke Program Early CT Score) - Ganglionic level infarction (caudate, lentiform nuclei, internal capsule, insula, M1-M3 cortex): 7 (thalamus does not contribute to aspects scoring - Supraganglionic infarction (M4-M6 cortex): 3 Total score (0-10 with 10 being normal): 10 IMPRESSION: 1. Possible acute RIGHT thalamic lacunar infarct. Subtle asymmetric hyperattenuation of the RIGHT ICA and M1 MCA. MRI brain and CTA head neck recommended. Premature for age atrophy and white matter disease. Chronic RIGHT centrum semiovale lacunar infarct. Chronic brainstem infarct. 2. ASPECTS is 10. These results were called by telephone at the time of interpretation on 10/15/2017 at 2:58 pm to EDP, who verbally acknowledged these results. Electronically Signed   By: Staci Righter M.D.   On: 10/15/2017 15:02      IMPRESSION AND PLAN:   Acute CVA with left-sided weakness. The patient will be admitted to medical floor. The patient was treated with aspirin in the ED.  Continue aspirin, start Lipitor, follow-up MRI and MRA of the brain, echocardiograph and carotid duplex.  Follow-up lipid panel and hemoglobin A1c.  Hypertension urgency.  Continue Norvasc, add Lopressor, IV hydralazine as needed.  ARF on CKD stage III.  Start normal saline IV and follow-up BMP.  Hypokalemia.   Give potassium supplement and follow-up level.  Morbid obesity.  Check lipid panel.  All the records are reviewed and case discussed with ED provider. Management plans discussed with the patient, family and they are in agreement.  CODE STATUS: Full code  TOTAL TIME TAKING CARE OF THIS PATIENT: 53 minutes.    Demetrios Loll M.D on 10/15/2017 at 5:08 PM  Between 7am to 6pm - Pager - [phone]  After 6pm go to [email or web address] - Proofreader  [facility]  Office  [phone]  CC: Primary care physician; Patient, No Pcp Per   Note: This dictation was prepared with Dragon dictation along with smaller phrase technology. Any transcriptional errors that result from this process are unin

## 2017-10-15 NOTE — Consult Note (Signed)
Referring Physician: Mariea Clonts    Chief Complaint: Left sided numbness and weakness  HPI: Jake Brown is an 51 y.o. male who reports that for the past month he has had intermittent problems with his left side.  Reports yesterday morning while placing his pain patch noted difficult using his left hand.  Later that day while walking his dog noted that he was dragging his left side.  Took some thing for congestion and took a nap.  Afterward his left side was worse.  This morning on walking the dog noted that his left sided was even weaker.   With no improvement presented for evaluation.    Date last known well: Date: 10/13/2017 Time last known well: Unable to determine tPA Given: No: Unable to determine LKW  Past Medical History:  Diagnosis Date  . Anxiety   . Arthritis   . Cancer of kidney [facility])    right renal carcinoma (nephrectomy )  . Chronic kidney disease   . Degenerative disc disease, cervical   . Degenerative disc disease, lumbar   . Difficult intubation    no problems 01/17/12-stated "small esophagus"  . Heart murmur 1983 noted   no symptoms  . Hyperlipidemia   . Hypertension   . Nephrolithiasis   . PONV (postoperative nausea and vomiting)   . PTSD (post-traumatic stress disorder)     Past Surgical History:  Procedure Laterality Date  . C4-5  surgery  2004  . CYSTOSCOPY W/ URETERAL STENT PLACEMENT  01/17/2012   Procedure: CYSTOSCOPY WITH RETROGRADE PYELOGRAM/URETERAL STENT PLACEMENT;  Surgeon: Hanley Ben, MD;  Location: [facility];  Service: Urology;  Laterality: Left;  . CYSTOSCOPY W/ URETERAL STENT REMOVAL  01/29/2012   Procedure: CYSTOSCOPY WITH STENT REMOVAL;  Surgeon: Franchot Gallo, MD;  Location: [facility];  Service: Urology;  Laterality: Left;     . CYSTOSCOPY WITH URETEROSCOPY  01/29/2012   Procedure: CYSTOSCOPY WITH URETEROSCOPY;  Surgeon: Franchot Gallo, MD;  Location: [facility];  Service: Urology;  Laterality: Left;   . LAMINECTOMY AND MICRODISCECTOMY CERVICAL SPINE  09-16-2006   RIGHT SIDE,  C6 - 7  . [place] WITH MESH AND EXTENSIVE LYSIS ADHESIONS  11-08-2010   RIGHT SUBCOSTAL VENTRAL INCISIONAL HERNIA  S/P RIGHT RADIAL  NEPHRECTOMY  . ORIF FEMUR FX    . TRANSABDOMINAL RIGHT RADICAL NEPHRECTOMY  12-19-1999   LARGE RIGHT RENAL CELL CARCINOMA  . URETERAL REIMPLANTION  CHILD   AND REMOVAL HUTCH DIVERTICULUM    Family History  Problem Relation Age of Onset  . Hypertension Mother   . Arthritis Mother   . Hyperlipidemia Father   . Cancer Father 53       bladder cancer   Social History:  reports that he quit smoking about 24 years ago. He quit after 20.00 years of use. He has never used smokeless tobacco. He reports that he does not drink alcohol or use drugs. Has a past history of poly substance abuse  Allergies: No Known Allergies  Medications: I have reviewed the patient's current medications. Prior to Admission:  Prior to Admission medications   Medication Sig Start Date End Date Taking? Authorizing Provider  amLODipine (NORVASC) 10 MG tablet TAKE 1 TABLET (10 MG TOTAL) BY MOUTH DAILY. 04/06/15  Yes Wardell Honour, MD  atorvastatin (LIPITOR) 10 MG tablet Take 1 tablet (10 mg total) by mouth daily. PATIENT NEEDS OFFICE VISIT/FASTING LABS FOR ADDITIONAL REFILLS 04/06/15  Yes Wardell Honour, MD  chlorthalidone (HYGROTON) 25  MG tablet TAKE 1 TABLET (25 MG TOTAL) BY MOUTH DAILY. 04/06/15  Yes Wardell Honour, MD  fentaNYL (DURAGESIC - DOSED MCG/HR) 12 MCG/HR Place 12.5 mcg onto the skin every 3 (three) days.   Yes [provider]  methocarbamol (ROBAXIN) 750 MG tablet Take 750 mg by mouth 3 (three) times daily as needed for muscle spasms.    Yes [provider]  oxyCODONE-acetaminophen (PERCOCET) 10-325 MG tablet Take 1 tablet by mouth every 6 (six) hours as needed for pain.   Yes [provider]    ROS: History obtained from the  patient  General ROS: negative for - chills, fatigue, fever, night sweats, weight gain or weight loss Psychological ROS: negative for - behavioral disorder, hallucinations, memory difficulties, mood swings or suicidal ideation Ophthalmic ROS: negative for - blurry vision, double vision, eye pain or loss of vision ENT ROS: negative for - epistaxis, nasal discharge, oral lesions, sore throat, tinnitus or vertigo Allergy and Immunology ROS: negative for - hives or itchy/watery eyes Hematological and Lymphatic ROS: negative for - bleeding problems, bruising or swollen lymph nodes Endocrine ROS: negative for - galactorrhea, hair pattern changes, polydipsia/polyuria or temperature intolerance Respiratory ROS: congestion Cardiovascular ROS: negative for - chest pain, dyspnea on exertion, edema or irregular heartbeat Gastrointestinal ROS: negative for - abdominal pain, diarrhea, hematemesis, nausea/vomiting or stool incontinence Genito-Urinary ROS: negative for - dysuria, hematuria, incontinence or urinary frequency/urgency Musculoskeletal ROS: neck pain, back pain Neurological ROS: as noted in HPI Dermatological ROS: negative for rash and skin lesion changes  Physical Examination: Blood pressure (!) 157/98, pulse 74, temperature (!) 97.4 F (36.3 C), resp. rate (!) 23, height 5\' 9"  (1.753 m), weight 124.7 kg (275 lb), SpO2 94 %.  HEENT-  Normocephalic, no lesions, without obvious abnormality.  Normal external eye and conjunctiva.  Normal TM's bilaterally.  Normal auditory canals and external ears. Normal external nose, mucus membranes and septum.  Normal pharynx. Cardiovascular- S1, S2 normal, pulses palpable throughout   Lungs- chest clear, no wheezing, rales, normal symmetric air entry Abdomen- soft, non-tender; bowel sounds normal; no masses,  no organomegaly Extremities- LE edema Lymph-no adenopathy palpable Musculoskeletal-no joint tenderness, deformity or swelling Skin-warm and dry, no  hyperpigmentation, vitiligo, or suspicious lesions  Neurological Examination   Mental Status: Alert, oriented, thought content appropriate.  Speech fluent without evidence of aphasia.  Able to follow 3 step commands without difficulty. Cranial Nerves: II: Discs flat bilaterally; Visual fields grossly normal, pupils equal, round, reactive to light and accommodation III,IV, VI: ptosis not present, extra-ocular motions intact bilaterally V,VII: decrease in right NLF, facial light touch sensation decreased on the left VIII: hearing normal bilaterally IX,X: gag reflex present XI: bilateral shoulder shrug XII: midline tongue extension Motor: Right : Upper extremity   5/5    Left:     Upper extremity   5/5 with 5-/5 hand grip  Lower extremity   5/5     Lower extremity   4+/5 Tone and bulk:normal tone throughout; no atrophy noted Sensory: Pinprick and light touch decreased on the left upper and lower extremities Deep Tendon Reflexes: 2+ and symmetric throughout Plantars: Right: upgoing   Left: upgoing Cerebellar: normal finger-to-nose, normal rapid alternating movements and normal heel-to-shin test Gait: not tested due to safety concerns    Laboratory Studies:  Basic Metabolic Panel: Recent Labs  Lab 10/15/17 1457  NA 139  K 3.3*  CL 102  CO2 24  GLUCOSE 134*  BUN 18  CREATININE 1.85*  CALCIUM  9.2    Liver Function Tests: Recent Labs  Lab 10/15/17 1457  AST 25  ALT 20  ALKPHOS 83  BILITOT 0.8  PROT 7.8  ALBUMIN 4.1   No results for input(s): LIPASE, AMYLASE in the last 168 hours. No results for input(s): AMMONIA in the last 168 hours.  CBC: Recent Labs  Lab 10/15/17 1457  WBC 10.7*  NEUTROABS 7.5*  HGB 16.6  HCT 48.5  MCV 87.4  PLT 263    Cardiac Enzymes: Recent Labs  Lab 10/15/17 1457  TROPONINI <0.03    BNP: Invalid input(s): POCBNP  CBG: Recent Labs  Lab 10/15/17 1453  GLUCAP 139*    Microbiology: Results for orders placed or performed  in visit on 12/21/14  Culture, Group A Strep     Status: None   Collection Time: 12/21/14  5:24 PM  Result Value Ref Range Status   Organism ID, Bacteria Normal Upper Respiratory Flora  Final   Organism ID, Bacteria No Beta Hemolytic Streptococci Isolated  Final    Coagulation Studies: Recent Labs    10/15/17 1457  LABPROT 12.3  INR 0.92    Urinalysis: No results for input(s): COLORURINE, LABSPEC, PHURINE, GLUCOSEU, HGBUR, BILIRUBINUR, KETONESUR, PROTEINUR, UROBILINOGEN, NITRITE, LEUKOCYTESUR in the last 168 hours.  Invalid input(s): APPERANCEUR  Lipid Panel: No results found for: CHOL, TRIG, HDL, CHOLHDL, VLDL, LDLCALC  HgbA1C:  Lab Results  Component Value Date   HGBA1C 5.7 (H) 11/27/2014    Urine Drug Screen:  No results found for: LABOPIA, COCAINSCRNUR, LABBENZ, AMPHETMU, THCU, LABBARB  Alcohol Level: No results for input(s): ETH in the last 168 hours.  Other results: EKG: sinus tachycardia at 112 bpm.  Imaging: Ct Head Code Stroke Wo Contrast  Result Date: 10/15/2017 CLINICAL DATA:  Code stroke. LEFT arm and leg weakness and numbness. This began yesterday at 6 p.m. EXAM: CT HEAD WITHOUT CONTRAST TECHNIQUE: Contiguous axial images were obtained from the base of the skull through the vertex without intravenous contrast. COMPARISON:  No prior CT head for comparison. FINDINGS: Brain: Premature for age atrophy. Extensive hypoattenuation of white matter, also advanced for age, consistent small vessel disease. Well-defined ovoid deep white matter lacunar infarct, RIGHT centrum semiovale, consistent with a chronic insult. Asymmetric hypoattenuation of the RIGHT external capsule, felt to represent small vessel disease. Large chronic lacunar infarct of the brainstem, LEFT greater than RIGHT paramedian pons. Indeterminate age 40 mm focus of hypoattenuation RIGHT thalamus. This does not clearly involve the posterior limb internal capsule. This could represent an acute lacunar infarct  contributing to LEFT-sided sensory symptoms, but would not be expected to cause weakness. Vascular: Subtle hyperattenuation of the supraclinoid ICA and M1 MCA on the RIGHT could represent emergent large vessel occlusion. CTA recommended. Calcific intracranial atherosclerosis. Dolichoectasia. Skull: Calvarium intact. Sinuses/Orbits: Negative sinuses and orbits. Other: None ASPECTS ([place] Stroke Program Early CT Score) - Ganglionic level infarction (caudate, lentiform nuclei, internal capsule, insula, M1-M3 cortex): 7 (thalamus does not contribute to aspects scoring - Supraganglionic infarction (M4-M6 cortex): 3 Total score (0-10 with 10 being normal): 10 IMPRESSION: 1. Possible acute RIGHT thalamic lacunar infarct. Subtle asymmetric hyperattenuation of the RIGHT ICA and M1 MCA. MRI brain and CTA head neck recommended. Premature for age atrophy and white matter disease. Chronic RIGHT centrum semiovale lacunar infarct. Chronic brainstem infarct. 2. ASPECTS is 10. These results were called by telephone at the time of interpretation on 10/15/2017 at 2:58 pm to EDP, who verbally acknowledged these results. Electronically Signed   By: Jenny Reichmann  Alfonse Flavors M.D.   On: 10/15/2017 15:02    Assessment: 52 y.o. male with a history of poorly controlled hypertension presenting with a left hemiparesis.  Patient outside window for tPA and intervention.  On no antiplatelet therapy at home.  Head CT reviewed and shows evidence of chronic infarcts and extensive small vessel ischemic changes but no acute changes.  Questionable right M1 occlusion but patient with elevated creatinine, one kidney and not in intervention window therefore CTA not performed.  Will proceed with stroke work up.    Stroke Risk Factors - hyperlipidemia and hypertension  Plan: 1. HgbA1c, fasting lipid panel 2. MRI, MRA  of the brain without contrast 3. PT consult, OT consult, Speech consult 4. Echocardiogram 5. Carotid dopplers 6. Prophylactic therapy-ASA  81mg , Plavix 75mg  daily 7. NPO until RN stroke swallow screen 8. Telemetry monitoring 9. Frequent neuro checks 10. BP control, but not aggressively so at this point.  Would decrease by 20%.      Alexis Goodell, MD Neurology [phone] 10/15/2017, 5:46 PM

## 2017-10-15 NOTE — ED Notes (Signed)
Pt being transported to MRI at this time.

## 2017-10-15 NOTE — Code Documentation (Signed)
Pt arrives with complaints of left leg weakness and numbness, states left arm numbness, states he noticed symptoms yesterday while walking his dog, LKW 3/27 time unclear, states symptoms worsened today, code stroke activated in triage, pt taken from triage to CT for non-con head CT and then room 8, NIHSS 2 with left leg drift and numbness, No tPA due to unclear LKW sometime 10/14/17, chaplin at bedside, report off to [facility]

## 2017-10-15 NOTE — Progress Notes (Signed)
Chaplain responded to a Code stroke. Chaplain prayed silently for staff and Pt. Ch call sister to notify of Pt appearing in ED. Chaplain will follow up.   10/15/17 1500  Clinical Encounter Type  Visited With Patient  Visit Type Initial  Referral From Nurse  Spiritual Encounters  Spiritual Needs Prayer;Emotional

## 2017-10-15 NOTE — ED Triage Notes (Signed)
FIRST NURSE NOTE-sent for poss stroke. Left leg not working. numbness left arm. Onset 6 pm last night. Pulled for triage.

## 2017-10-16 ENCOUNTER — Encounter: Payer: Self-pay | Admitting: Internal Medicine

## 2017-10-16 ENCOUNTER — Inpatient Hospital Stay: Payer: Managed Care, Other (non HMO)

## 2017-10-16 DIAGNOSIS — I639 Cerebral infarction, unspecified: Secondary | ICD-10-CM | POA: Diagnosis not present

## 2017-10-16 LAB — URINE DRUG SCREEN, QUALITATIVE (ARMC ONLY)
AMPHETAMINES, UR SCREEN: NOT DETECTED
Barbiturates, Ur Screen: NOT DETECTED
Benzodiazepine, Ur Scrn: POSITIVE — AB
Cannabinoid 50 Ng, Ur ~~LOC~~: NOT DETECTED
Cocaine Metabolite,Ur ~~LOC~~: NOT DETECTED
MDMA (ECSTASY) UR SCREEN: NOT DETECTED
Methadone Scn, Ur: NOT DETECTED
Opiate, Ur Screen: NOT DETECTED
Phencyclidine (PCP) Ur S: NOT DETECTED
TRICYCLIC, UR SCREEN: NOT DETECTED

## 2017-10-16 LAB — LIPID PANEL
Cholesterol: 197 mg/dL (ref 0–200)
HDL: 43 mg/dL (ref >40)
LDL Cholesterol: 126 mg/dL — ABNORMAL HIGH (ref 0–99)
Total CHOL/HDL Ratio: 4.6 RATIO
Triglycerides: 138 mg/dL (ref <150)
VLDL: 28 mg/dL (ref 0–40)

## 2017-10-16 LAB — ECHOCARDIOGRAM COMPLETE
Height: 69 in
WEIGHTICAEL: 4400 [oz_av]

## 2017-10-16 LAB — HEMOGLOBIN A1C
HEMOGLOBIN A1C: 5.3 % (ref 4.8–5.6)
Mean Plasma Glucose: 105.41 mg/dL

## 2017-10-16 MED ORDER — CHLORTHALIDONE 25 MG PO TABS
25.0000 mg | ORAL_TABLET | Freq: Every day | ORAL | Status: DC
  Administered 2017-10-16: 16:00:00 25 mg via ORAL
  Filled 2017-10-16: qty 1

## 2017-10-16 MED ORDER — BUPROPION HCL ER (XL) 300 MG PO TB24
300.0000 mg | ORAL_TABLET | Freq: Every day | ORAL | Status: DC
  Administered 2017-10-16: 16:00:00 300 mg via ORAL
  Filled 2017-10-16: qty 1

## 2017-10-16 MED ORDER — HYDRALAZINE HCL 50 MG PO TABS
25.0000 mg | ORAL_TABLET | Freq: Three times a day (TID) | ORAL | Status: DC
  Administered 2017-10-16: 16:00:00 25 mg via ORAL
  Filled 2017-10-16: qty 1

## 2017-10-16 MED ORDER — HYDRALAZINE HCL 25 MG PO TABS: 25.0000 mg | ORAL_TABLET | Freq: Three times a day (TID) | ORAL | 0 refills | Status: DC

## 2017-10-16 MED ORDER — ATORVASTATIN CALCIUM 40 MG PO TABS: 40.0000 mg | ORAL_TABLET | Freq: Every day | ORAL | 0 refills | Status: DC

## 2017-10-16 MED ORDER — ASPIRIN EC 81 MG PO TBEC: 81.0000 mg | DELAYED_RELEASE_TABLET | Freq: Every day | ORAL | Status: DC

## 2017-10-16 MED ORDER — FENTANYL 12 MCG/HR TD PT72
12.5000 ug | MEDICATED_PATCH | TRANSDERMAL | Status: DC
  Administered 2017-10-16: 16:00:00 12.5 ug via TRANSDERMAL
  Filled 2017-10-16: qty 1

## 2017-10-16 MED ORDER — POTASSIUM CHLORIDE CRYS ER 20 MEQ PO TBCR
40.0000 meq | EXTENDED_RELEASE_TABLET | ORAL | Status: DC
  Administered 2017-10-16: 16:00:00 40 meq via ORAL
  Filled 2017-10-16: qty 2

## 2017-10-16 NOTE — Progress Notes (Signed)
PT Cancellation Note  Patient Details Name: Jake Brown MRN: [id number] DOB: 1965/11/21   Cancelled Treatment:    Reason Eval/Treat Not Completed: Patient at procedure or test/unavailable, will attempt to see pt at a future date/time as medically appropriate.    Linus Salmons PT, DPT 10/16/17, 11:53 AM

## 2017-10-16 NOTE — Progress Notes (Signed)
Subjective: Patient remains tearful.  No new neurological complaints.    Objective: Current vital signs: BP (!) 158/95 (BP Location: Right Arm)   Pulse 69   Temp (!) 97.5 F (36.4 C) (Oral)   Resp 18   Ht 5\' 9"  (1.753 m)   Wt 124.7 kg (275 lb)   SpO2 97%   BMI 40.61 kg/m  Vital signs in last 24 hours: Temp:  [97.4 F (36.3 C)-98.4 F (36.9 C)] 97.5 F (36.4 C) (03/29 0557) Pulse Rate:  [61-123] 69 (03/29 0557) Resp:  [13-28] 18 (03/29 0557) BP: (140-200)/(80-136) 158/95 (03/29 0557) SpO2:  [90 %-97 %] 97 % (03/29 0557) Weight:  [124.7 kg (275 lb)] 124.7 kg (275 lb) (03/28 1437)  Intake/Output from previous day: 03/28 0701 - 03/29 0700 In: 613.3 [I.V.:613.3] Out: -  Intake/Output this shift: Total I/O In: 360 [P.O.:360] Out: 700 [Urine:700] Nutritional status: Fall precautions Diet Heart Room service appropriate? Yes; Fluid consistency: Thin  Neurologic Exam: Mental Status: Alert, oriented, thought content appropriate.  Speech fluent without evidence of aphasia.  Able to follow 3 step commands without difficulty. Cranial Nerves: II: Discs flat bilaterally; Visual fields grossly normal, pupils equal, round, reactive to light and accommodation III,IV, VI: ptosis not present, extra-ocular motions intact bilaterally V,VII: decrease in right NLF, facial light touch sensation decreased on the left VIII: hearing normal bilaterally IX,X: gag reflex present XI: bilateral shoulder shrug XII: midline tongue extension Motor: Right :  Upper extremity   5/5                                      Left:     Upper extremity   5/5 with 5-/5 hand grip             Lower extremity   5/5                                                  Lower extremity   4+/5 Tone and bulk:normal tone throughout; no atrophy noted Sensory: Pinprick and light touch decreased on the left upper and lower extremities   Lab Results: Basic Metabolic Panel: Recent Labs  Lab 10/15/17 1457  NA 139  K 3.3*   CL 102  CO2 24  GLUCOSE 134*  BUN 18  CREATININE 1.85*  CALCIUM 9.2  MG 2.0    Liver Function Tests: Recent Labs  Lab 10/15/17 1457  AST 25  ALT 20  ALKPHOS 83  BILITOT 0.8  PROT 7.8  ALBUMIN 4.1   No results for input(s): LIPASE, AMYLASE in the last 168 hours. No results for input(s): AMMONIA in the last 168 hours.  CBC: Recent Labs  Lab 10/15/17 1457  WBC 10.7*  NEUTROABS 7.5*  HGB 16.6  HCT 48.5  MCV 87.4  PLT 263    Cardiac Enzymes: Recent Labs  Lab 10/15/17 1457  TROPONINI <0.03    Lipid Panel: Recent Labs  Lab 10/16/17 0703  CHOL 197  TRIG 138  HDL 43  CHOLHDL 4.6  VLDL 28  LDLCALC 126*    CBG: Recent Labs  Lab 10/15/17 1453  GLUCAP 139*    Microbiology: Results for orders placed or performed in visit on 12/21/14  Culture, Group A Strep     Status: None  Collection Time: 12/21/14  5:24 PM  Result Value Ref Range Status   Organism ID, Bacteria Normal Upper Respiratory Flora  Final   Organism ID, Bacteria No Beta Hemolytic Streptococci Isolated  Final    Coagulation Studies: Recent Labs    10/15/17 1457  LABPROT 12.3  INR 0.92    Imaging: Mr Brain Wo Contrast  Result Date: 10/15/2017 CLINICAL DATA:  LEFT extremity weakness since yesterday, worsening today. Slurred speech. Follow-up potential RIGHT thalamus lacunar infarct. History of hypertension, hyperlipidemia and renal cancer. EXAM: MRI HEAD WITHOUT CONTRAST MRA HEAD WITHOUT CONTRAST TECHNIQUE: Multiplanar, multiecho pulse sequences of the brain and surrounding structures were obtained without intravenous contrast. Angiographic images of the head were obtained using MRA technique without contrast. COMPARISON:  CT HEAD October 15, 2017 FINDINGS: MRI HEAD FINDINGS INTRACRANIAL CONTENTS: 11 x 11 mm reduced diffusion RIGHT basal ganglia to corona radiata. Old RIGHT basal ganglia infarct with susceptibility artifact. Extensive scattered micro hemorrhages within supra-and  infratentorial brain. Old RIGHT thalamus lacunar infarct. Patchy to confluent supratentorial white matter FLAIR T2 hyperintensities, cystic component RIGHT centrum semiovale. Old RIGHT mesial thalamus infarct. Old cystic pontine moderate parenchymal brain volume loss, advanced for age. Mildly narrowed callosal angle and effaced cerebral spinal fluid space at convexity. No midline shift, mass effect or masses. No abnormal extra-axial fluid collections. VASCULAR: Normal major intracranial vascular flow voids present at skull base. SKULL AND UPPER CERVICAL SPINE: No abnormal sellar expansion. No suspicious calvarial bone marrow signal. Craniocervical junction maintained. SINUSES/ORBITS: Lobulated paranasal sinus mucosal thickening without air-fluid levels. Mastoid air cells are well aerated. Thisthe included ocular globes and orbital contents are non-suspicious. OTHER: None. MRA HEAD FINDINGS ANTERIOR CIRCULATION: Flow related enhancement of the included cervical, petrous, cavernous and supraclinoid internal carotid arteries with dolichoectasia. Patent anterior communicating artery. Patent anterior and middle cerebral arteries. No large vessel occlusion, flow limiting stenosis, aneurysm. POSTERIOR CIRCULATION: LEFT vertebral artery is dominant. Basilar artery is patent, with normal flow related enhancement of the main branch vessels. Dolichoectatic posterior circulation. Patent posterior cerebral arteries. No large vessel occlusion, flow limiting stenosis,  aneurysm. ANATOMIC VARIANTS: None. Source images and MIP images were reviewed. IMPRESSION: MRI HEAD: 1. Acute nonhemorrhagic small RIGHT basal ganglia to corona radiata infarct. Old RIGHT thalamus lacunar infarct. 2. Old RIGHT basal ganglia infarct. Old pontine and cerebellar infarcts. 3. Moderate to severe chronic small vessel ischemic disease. Scattered micro hemorrhages suggesting chronic hypertension, less likely amyloid angiopathy. 4. Moderate parenchymal brain  volume loss for age, potential component of normal pressure hydrocephalus. MRA HEAD: 1. No emergent large vessel occlusion or flow limiting stenosis. 2. Dolichoectasia seen with chronic hypertension. Electronically Signed   By: Elon Alas M.D.   On: 10/15/2017 18:30   Mr Jodene Nam Head/brain ZO Cm  Result Date: 10/15/2017 CLINICAL DATA:  LEFT extremity weakness since yesterday, worsening today. Slurred speech. Follow-up potential RIGHT thalamus lacunar infarct. History of hypertension, hyperlipidemia and renal cancer. EXAM: MRI HEAD WITHOUT CONTRAST MRA HEAD WITHOUT CONTRAST TECHNIQUE: Multiplanar, multiecho pulse sequences of the brain and surrounding structures were obtained without intravenous contrast. Angiographic images of the head were obtained using MRA technique without contrast. COMPARISON:  CT HEAD October 15, 2017 FINDINGS: MRI HEAD FINDINGS INTRACRANIAL CONTENTS: 11 x 11 mm reduced diffusion RIGHT basal ganglia to corona radiata. Old RIGHT basal ganglia infarct with susceptibility artifact. Extensive scattered micro hemorrhages within supra-and infratentorial brain. Old RIGHT thalamus lacunar infarct. Patchy to confluent supratentorial white matter FLAIR T2 hyperintensities, cystic component RIGHT centrum semiovale. Old RIGHT  mesial thalamus infarct. Old cystic pontine moderate parenchymal brain volume loss, advanced for age. Mildly narrowed callosal angle and effaced cerebral spinal fluid space at convexity. No midline shift, mass effect or masses. No abnormal extra-axial fluid collections. VASCULAR: Normal major intracranial vascular flow voids present at skull base. SKULL AND UPPER CERVICAL SPINE: No abnormal sellar expansion. No suspicious calvarial bone marrow signal. Craniocervical junction maintained. SINUSES/ORBITS: Lobulated paranasal sinus mucosal thickening without air-fluid levels. Mastoid air cells are well aerated. Thisthe included ocular globes and orbital contents are non-suspicious.  OTHER: None. MRA HEAD FINDINGS ANTERIOR CIRCULATION: Flow related enhancement of the included cervical, petrous, cavernous and supraclinoid internal carotid arteries with dolichoectasia. Patent anterior communicating artery. Patent anterior and middle cerebral arteries. No large vessel occlusion, flow limiting stenosis, aneurysm. POSTERIOR CIRCULATION: LEFT vertebral artery is dominant. Basilar artery is patent, with normal flow related enhancement of the main branch vessels. Dolichoectatic posterior circulation. Patent posterior cerebral arteries. No large vessel occlusion, flow limiting stenosis,  aneurysm. ANATOMIC VARIANTS: None. Source images and MIP images were reviewed. IMPRESSION: MRI HEAD: 1. Acute nonhemorrhagic small RIGHT basal ganglia to corona radiata infarct. Old RIGHT thalamus lacunar infarct. 2. Old RIGHT basal ganglia infarct. Old pontine and cerebellar infarcts. 3. Moderate to severe chronic small vessel ischemic disease. Scattered micro hemorrhages suggesting chronic hypertension, less likely amyloid angiopathy. 4. Moderate parenchymal brain volume loss for age, potential component of normal pressure hydrocephalus. MRA HEAD: 1. No emergent large vessel occlusion or flow limiting stenosis. 2. Dolichoectasia seen with chronic hypertension. Electronically Signed   By: Elon Alas M.D.   On: 10/15/2017 18:30   Ct Head Code Stroke Wo Contrast  Result Date: 10/15/2017 CLINICAL DATA:  Code stroke. LEFT arm and leg weakness and numbness. This began yesterday at 6 p.m. EXAM: CT HEAD WITHOUT CONTRAST TECHNIQUE: Contiguous axial images were obtained from the base of the skull through the vertex without intravenous contrast. COMPARISON:  No prior CT head for comparison. FINDINGS: Brain: Premature for age atrophy. Extensive hypoattenuation of white matter, also advanced for age, consistent small vessel disease. Well-defined ovoid deep white matter lacunar infarct, RIGHT centrum semiovale, consistent  with a chronic insult. Asymmetric hypoattenuation of the RIGHT external capsule, felt to represent small vessel disease. Large chronic lacunar infarct of the brainstem, LEFT greater than RIGHT paramedian pons. Indeterminate age 7 mm focus of hypoattenuation RIGHT thalamus. This does not clearly involve the posterior limb internal capsule. This could represent an acute lacunar infarct contributing to LEFT-sided sensory symptoms, but would not be expected to cause weakness. Vascular: Subtle hyperattenuation of the supraclinoid ICA and M1 MCA on the RIGHT could represent emergent large vessel occlusion. CTA recommended. Calcific intracranial atherosclerosis. Dolichoectasia. Skull: Calvarium intact. Sinuses/Orbits: Negative sinuses and orbits. Other: None ASPECTS ([place] Stroke Program Early CT Score) - Ganglionic level infarction (caudate, lentiform nuclei, internal capsule, insula, M1-M3 cortex): 7 (thalamus does not contribute to aspects scoring - Supraganglionic infarction (M4-M6 cortex): 3 Total score (0-10 with 10 being normal): 10 IMPRESSION: 1. Possible acute RIGHT thalamic lacunar infarct. Subtle asymmetric hyperattenuation of the RIGHT ICA and M1 MCA. MRI brain and CTA head neck recommended. Premature for age atrophy and white matter disease. Chronic RIGHT centrum semiovale lacunar infarct. Chronic brainstem infarct. 2. ASPECTS is 10. These results were called by telephone at the time of interpretation on 10/15/2017 at 2:58 pm to EDP, who verbally acknowledged these results. Electronically Signed   By: Staci Righter M.D.   On: 10/15/2017 15:02    Medications:  I have reviewed the patient's current medications. Scheduled: . amLODipine  10 mg Oral Daily  . aspirin  300 mg Rectal Daily   Or  . aspirin  325 mg Oral Daily  . atorvastatin  40 mg Oral q1800  . chlorthalidone  25 mg Oral Daily  . fentaNYL  12.5 mcg Transdermal Q72H  . heparin  5,000 Units Subcutaneous Q8H  . LORazepam  1 mg Intravenous  Once  . metoprolol tartrate  25 mg Oral BID  . potassium chloride  40 mEq Oral Q4H    Assessment/Plan: Patient without new neurological complaints. MRI of the brain reviewed and shows chronic hypertensive changes and an acute right BG infarct.  Etiology likely due to small vessel disease.  MRA shows no large vessel occlusion.  Echocardiogram and carotid dopplers are pending.  LDL 126.  A1c pending  Patient admits to not being compliant with medications prior to admission.    Recommendations: 1.  With no significant M1 disease noted would place patient on ASA daily.  Would not continue Plavix at this time.  2.  Statin for lipid management with target LDL<70. 3.  Patient now multiple days out from acute infarct.  Would be aggressive with BP control    LOS: 1 day   Alexis Goodell, MD Neurology [phone] 10/16/2017  11:02 AM

## 2017-10-16 NOTE — Progress Notes (Signed)
SLP Cancellation Note  Patient Details Name: Jake Brown MRN: [id number] DOB: 11-10-1965   Cancelled treatment:       Reason Eval/Treat Not Completed: SLP screened, no needs identified, will sign off(chart reviewed; NSG consulted then met w/ pt). Pt denied any difficulty swallowing and is currently on a regular diet; tolerates swallowing pills w/ water per NSG. Pt conversed at conversational level w/out deficits noted; also observed pt talking on the phone w/ MD w/out apparent deficits. Pt denied any speech-language deficits.  No further skilled ST services indicated as pt appears at his baseline. Pt agreed. NSG to reconsult if any change in status.    Orinda Kenner, MS, CCC-SLP Watson,Katherine 10/16/2017, 4:20 PM

## 2017-10-16 NOTE — Evaluation (Signed)
Occupational Therapy Evaluation Patient Details Name: Jake Brown MRN: [id number] DOB: Dec 14, 1965 Today's Date: 10/16/2017    History of Present Illness Pt is a41 y.o.malewith a known history of hypertension, hyperlipidemia, CKD, kidney cancer and anxiety. The patient presented to ED with above chief complaints. The patient felt left arm and leg weakness. He had worsening left leg weakness with numbness and tingling with no slurred speech, dysphagia or incontinence. Was found with high blood pressure at 200/121 in the ED. Imaging of the head showed acute right basal ganglia infarct. Pt has only one kidney.  PMH includes: anxiety, kidney CA, CKD, lumbar and cervical DDD, heart murmur, HLD, HTN, nephrolithiasis, C4-5 and C6-7 Sx,  L femur ORIF, and PTSD.      Clinical Impression   Pt is 52 year old male who presents with new onset of acute R basal ganglia CVA (see above for presenting problems).  Pt presents with lability and concern about recently moving here after separating from his wife in [place].  He lives in an apartment with 2 dogs and has a male friend and her daughter who are helping with his dogs but will not be able to provide any daily assist.  He presents with decreased gross and mainly fine motor control of left, non-dominant hand with impaired light touch and increased tone.  Rec continued OT while in hospital to continue to work on increasing independence in ADLs, balance and functional mobility training, coordination exercises and family ed and training and OT [facility] after discharge. Pt is concerned that he no longer has [employer] and talked to [place] from [employer] about [place] [facility] if he does not have insurance and pt given handout about [place] [facility].  Also rec s shower chair for use at home to increase safety in bathroom.     Follow Up Recommendations  Home health OT    Equipment Recommendations  Tub/shower seat    Recommendations for Other Services       Precautions  / Restrictions Precautions Precautions: Fall Precaution Comments: Genu recurvatum on the LLE Restrictions Weight Bearing Restrictions: No      Mobility Bed Mobility Overal bed mobility: Modified Independent             General bed mobility comments: Extra time and effort for LLE in and out of bed but no physical assistance required  Transfers Overall transfer level: Needs assistance Equipment used: Rolling walker (2 wheeled) Transfers: Sit to/from Stand Sit to Stand: Min guard         General transfer comment: Extra effort during transfers with initial min instability but pt able to self-correct    Balance Overall balance assessment: Needs assistance   Sitting balance-Leahy Scale: Normal     Standing balance support: Bilateral upper extremity supported Standing balance-Leahy Scale: Good                             ADL either performed or assessed with clinical judgement   ADL Overall ADL's : Needs assistance/impaired Eating/Feeding: Set up;Minimal assistance Eating/Feeding Details (indicate cue type and reason): to open containers Grooming: Wash/dry hands;Wash/dry face;Oral care;Applying deodorant;Set up;Brushing hair;Minimal assistance Grooming Details (indicate cue type and reason): mainly for 2 handed tasks         Upper Body Dressing : Set up;Minimal assistance Upper Body Dressing Details (indicate cue type and reason): for buttons and to pull shirt down with L hand Lower Body Dressing: Set up;Minimal assistance;Sit to/from  stand                 General ADL Comments: Overall pt needs min assist for ADLs and educated in home fine motor control program for L hand with rec for OT [facility] and not clear if Christella Scheuermann has continued or not since moving from [place] and given handout about [place] clinic in [place] in case he does not have coverage.  Spoke to [place] from [employer] about this.      Vision Baseline Vision/History: Wears glasses Wears Glasses: At all  times Patient Visual Report: No change from baseline       Perception     Praxis      Pertinent Vitals/Pain Pain Assessment: No/denies pain     Hand Dominance Right   Extremity/Trunk Assessment Upper Extremity Assessment Upper Extremity Assessment: LUE deficits/detail LUE Deficits / Details: Pt has intact hot/cold and proprioception but impaired light touch.  He has full hand grasp and release but decreased intrinsic muscle control imparing fine motor skills and has regained LUE shoulder flexion to about 165 degrees but poor control and increased muscle tone. LUE Sensation: decreased light touch LUE Coordination: decreased fine motor   Lower Extremity Assessment Lower Extremity Assessment: Defer to PT evaluation LLE Deficits / Details: L hip flex 3/5, L knee flex/ext 4/5, and L ankle DF 4/5 with L knee genu recurvatum noted LLE Sensation: decreased light touch       Communication Communication Communication: No difficulties   Cognition Arousal/Alertness: Awake/alert Behavior During Therapy: WFL for tasks assessed/performed Overall Cognitive Status: Within Functional Limits for tasks assessed                                 General Comments: lability    General Comments       Exercises Total Joint Exercises Ankle Circles/Pumps: AROM;Both;5 reps;10 reps Quad Sets: Strengthening;Both;10 reps Gluteal Sets: Strengthening;Both;10 reps Hip ABduction/ADduction: AROM;Both;5 reps Straight Leg Raises: AROM;Both;5 reps Long Arc Quad: AROM;Both;10 reps Knee Flexion: AROM;Both;10 reps Marching in Standing: AROM;Both;10 reps Other Exercises Other Exercises: Sit to/from stand transfer training with cues for proper hand placement for stability   Shoulder Instructions      Home Living Family/patient expects to be discharged to:: Private residence Living Arrangements: Alone Available Help at Discharge: Family;Friend(s);Available PRN/intermittently Type of Home:  Apartment Home Access: Level entry     Home Layout: One level     Bathroom Shower/Tub: Teacher, early years/pre: Standard Bathroom Accessibility: No   Home Equipment: Cane - single point   Additional Comments: Pt owns two wooden carved canes      Prior Functioning/Environment Level of Independence: Independent        Comments: Ind amb without AD community distances, Ind with ADLs, 2 recent falls secondary to loss of balance        OT Problem List: Impaired tone;Decreased range of motion;Decreased activity tolerance;Impaired UE functional use      OT Treatment/Interventions: Self-care/ADL training;Therapeutic exercise;Therapeutic activities;Neuromuscular education    OT Goals(Current goals can be found in the care plan section) Acute Rehab OT Goals Patient Stated Goal: Regain use of my L hand---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 OT Goal Formulation: With patient Time For Goal Achievement: 10/30/17 Potential to Achieve Goals: Good ADL Goals Pt Will Perform Upper Body Dressing: with modified independence;with set-up;sitting Pt Will Perform Lower Body Dressing: with supervision;with set-up;sit to/from stand Pt Will  Transfer to Toilet: with supervision;with set-up Pt/caregiver will Perform Home Exercise Program: Left upper extremity;With written HEP provided;Independently  OT Frequency: Min 1X/week   Barriers to D/C:    lives alone and currently separate from wife who is in [place] PT "6 Clicks" Daily Activity     Outcome Measure Help from another person eating meals?: A Little Help from another person taking care of personal grooming?: A Little Help from another person toileting, which includes using toliet, bedpan, or urinal?: A Little Help from another person bathing (including washing, rinsing, drying)?: A Little Help from another person to put on and taking off regular upper body clothing?: A Little Help  from another person to put on and taking off regular lower body clothing?: A Little 6 Click Score: 18   End of Session    Activity Tolerance: Patient tolerated treatment well Patient left: in chair;with chair alarm set;with call bell/phone within reach  OT Visit Diagnosis: Other symptoms and signs involving the nervous system (R29.898);Muscle weakness (generalized) (M62.81)                Time: 8416-6063 OT Time Calculation (min): 62 min Charges:  OT General Charges $OT Visit: 1 Visit OT Evaluation $OT Eval Low Complexity: 1 Low OT Treatments $Self Care/Home Management : 8-22 mins $Neuromuscular Re-education: 23-37 mins G-Codes:     Chrys Racer, OTR/L ascom [phone] 10/16/17, 3:22 PM

## 2017-10-16 NOTE — Evaluation (Signed)
Physical Therapy Evaluation Patient Details Name: Jake Brown MRN: [id number] DOB: 03/07/1966 Today's Date: 10/16/2017   History of Present Illness  Pt is a14 y.o.malewith a known history of hypertension, hyperlipidemia, CKD, kidney cancer and anxiety. The patient presented to ED with above chief complaints. The patient felt left arm and leg weakness. He had worsening left leg weakness with numbness and tingling with no slurred speech, dysphagia or incontinence. Was found with high blood pressure at 200/121 in the ED. Imaging of the head showed acute right basal ganglia infarct. Pt has only one kidney.  PMH includes: anxiety, kidney CA, CKD, lumbar and cervical DDD, heart murmur, HLD, HTN, nephrolithiasis, C4-5 and C6-7 Sx,  L femur ORIF, and PTSD.       Clinical Impression  Pt presents with mild deficits in strength, transfers, mobility, gait, balance, and activity tolerance.  Pt presented with mild L-sided hemiparesis with light touch present to the LUE and LLE but diminished compared to the R.  Pt presented with deficits to fine motor control with the LUE.  Pt required extra time and effort with bed mobility tasks but no physical assistance.  Standing from a low surface was effortful for patient with extra time required along with min instability upon initial stand that pt was able to self-correct.  Pt was steadier in standing from an elevated surface and with cues for proper sequencing.  Pt able to amb 40' with CGA with a RW without signs of ataxia and with good stability.  Of note, pt presented with genu recurvatum to the L knee which according to pt is chronic from an old LLE injury but is exacerbated by current symptoms and is harder to control.  Pt would likely benefit from a swedish knee cage type brace to prevent hyperextension of the L knee.  Pt will benefit from HHPT services upon discharge to safely address above deficits for decreased caregiver assistance and eventual return to  [facility].       Follow Up Recommendations Home health PT    Equipment Recommendations  Rolling walker with 5" wheels;Other (comment)(Bariatric RW)    Recommendations for Other Services       Precautions / Restrictions Precautions Precautions: Fall Precaution Comments: Genu recurvatum on the LLE Restrictions Weight Bearing Restrictions: No      Mobility  Bed Mobility Overal bed mobility: Modified Independent             General bed mobility comments: Extra time and effort for LLE in and out of bed but no physical assistance required  Transfers Overall transfer level: Needs assistance Equipment used: Rolling walker (2 wheeled) Transfers: Sit to/from Stand Sit to Stand: Min guard         General transfer comment: Extra effort during transfers with initial min instability but pt able to self-correct  Ambulation/Gait Ambulation/Gait assistance: Min guard Ambulation Distance (Feet): 40 Feet Assistive device: Rolling walker (2 wheeled) Gait Pattern/deviations: Step-through pattern   Gait velocity interpretation: Below normal speed for age/gender General Gait Details: Good stability with gait with no ataxia or L foot drop noted  Stairs            Wheelchair Mobility    Modified Rankin (Stroke Patients Only)       Balance Overall balance assessment: Needs assistance   Sitting balance-Leahy Scale: Normal     Standing balance support: Bilateral upper extremity supported Standing balance-Leahy Scale: Good  Pertinent Vitals/Pain Pain Assessment: No/denies pain    Home Living Family/patient expects to be discharged to:: Private residence Living Arrangements: Alone Available Help at Discharge: Family;Friend(s);Available PRN/intermittently Type of Home: Apartment Home Access: Level entry     Home Layout: One level   Additional Comments: Pt owns two wooden carved canes    Prior Function Level of  Independence: Independent         Comments: Ind amb without AD community distances, Ind with ADLs, 2 recent falls secondary to loss of balance     Hand Dominance   Dominant Hand: Right    Extremity/Trunk Assessment   Upper Extremity Assessment Upper Extremity Assessment: Defer to OT evaluation;LUE deficits/detail LUE Deficits / Details: Minimally delayed activation with LUE movements with min deficits in strength of the shoulder and elbow and moderate deficits in intrinsic finger strength LUE Sensation: decreased light touch LUE Coordination: decreased fine motor    Lower Extremity Assessment Lower Extremity Assessment: Generalized weakness;LLE deficits/detail LLE Deficits / Details: L hip flex 3/5, L knee flex/ext 4/5, and L ankle DF 4/5 with L knee genu recurvatum noted LLE Sensation: decreased light touch       Communication   Communication: No difficulties  Cognition Arousal/Alertness: Awake/alert Behavior During Therapy: WFL for tasks assessed/performed Overall Cognitive Status: Within Functional Limits for tasks assessed                                        General Comments      Exercises Total Joint Exercises Ankle Circles/Pumps: AROM;Both;5 reps;10 reps Quad Sets: Strengthening;Both;10 reps Gluteal Sets: Strengthening;Both;10 reps Hip ABduction/ADduction: AROM;Both;5 reps Straight Leg Raises: AROM;Both;5 reps Long Arc Quad: AROM;Both;10 reps Knee Flexion: AROM;Both;10 reps Marching in Standing: AROM;Both;10 reps Other Exercises Other Exercises: Sit to/from stand transfer training with cues for proper hand placement for stability   Assessment/Plan    PT Assessment Patient needs continued PT services  PT Problem List Decreased strength;Decreased activity tolerance;Decreased balance;Decreased mobility;Decreased knowledge of use of DME       PT Treatment Interventions DME instruction;Gait training;Functional mobility  training;Neuromuscular re-education;Balance training;Therapeutic exercise;Therapeutic activities;Patient/family education    PT Goals (Current goals can be found in the Care Plan section)  Acute Rehab PT Goals Patient Stated Goal: Better balance PT Goal Formulation: With patient Time For Goal Achievement: 10/29/17 Potential to Achieve Goals: Good    Frequency 7X/week   Barriers to discharge        Co-evaluation               AM-PAC PT "6 Clicks" Daily Activity  Outcome Measure Difficulty turning over in bed (including adjusting bedclothes, sheets and blankets)?: A Little Difficulty moving from lying on back to sitting on the side of the bed? : A Little Difficulty sitting down on and standing up from a chair with arms (e.g., wheelchair, bedside commode, etc,.)?: Unable Help needed moving to and from a bed to chair (including a wheelchair)?: A Little Help needed walking in hospital room?: A Little Help needed climbing 3-5 steps with a railing? : A Little 6 Click Score: 16    End of Session Equipment Utilized During Treatment: Gait belt Activity Tolerance: Patient tolerated treatment well Patient left: in chair;with call bell/phone within reach;with chair alarm set Nurse Communication: Mobility status PT Visit Diagnosis: Muscle weakness (generalized) (M62.81);Hemiplegia and hemiparesis Hemiplegia - Right/Left: Left Hemiplegia - dominant/non-dominant: Non-dominant Hemiplegia - caused by:  Cerebral infarction    Time: 1638-4665 PT Time Calculation (min) (ACUTE ONLY): 48 min   Charges:   PT Evaluation $PT Eval Moderate Complexity: 1 Mod PT Treatments $Therapeutic Exercise: 8-22 mins   PT G Codes:        DRoyetta Asal PT, DPT 10/16/17, 2:51 PM

## 2017-10-16 NOTE — Progress Notes (Signed)
Discharge instructions given and went over with patient at bedside. Prescriptions reviewed. All questions answered. Patient verbalized understanding. Patient discharged home with neighbor via wheelchair by nursing staff. Madlyn Frankel, RN

## 2017-10-16 NOTE — Discharge Instructions (Signed)
Heart healthy diet

## 2017-10-17 LAB — HIV ANTIBODY (ROUTINE TESTING W REFLEX): HIV Screen 4th Generation wRfx: NONREACTIVE

## 2017-10-20 ENCOUNTER — Telehealth: Payer: Self-pay

## 2017-10-20 NOTE — Telephone Encounter (Signed)
EMMI Follow-up: Called patient as the report noted he had some concerns about discharge papers, who to follow-up if condition changes and follow-up appt.  Will call again.

## 2017-10-26 ENCOUNTER — Ambulatory Visit: Payer: Managed Care, Other (non HMO) | Admitting: Internal Medicine

## 2017-10-26 ENCOUNTER — Encounter: Payer: Self-pay | Admitting: Internal Medicine

## 2017-10-26 VITALS — BP 92/82 | HR 112 | Temp 98.4°F | Ht 68.5 in | Wt 272.6 lb

## 2017-10-26 DIAGNOSIS — F419 Anxiety disorder, unspecified: Secondary | ICD-10-CM

## 2017-10-26 DIAGNOSIS — I10 Essential (primary) hypertension: Secondary | ICD-10-CM

## 2017-10-26 DIAGNOSIS — D72829 Elevated white blood cell count, unspecified: Secondary | ICD-10-CM

## 2017-10-26 DIAGNOSIS — N183 Chronic kidney disease, stage 3 unspecified: Secondary | ICD-10-CM | POA: Insufficient documentation

## 2017-10-26 DIAGNOSIS — I959 Hypotension, unspecified: Secondary | ICD-10-CM | POA: Diagnosis not present

## 2017-10-26 DIAGNOSIS — Z1329 Encounter for screening for other suspected endocrine disorder: Secondary | ICD-10-CM

## 2017-10-26 DIAGNOSIS — G894 Chronic pain syndrome: Secondary | ICD-10-CM

## 2017-10-26 DIAGNOSIS — Z125 Encounter for screening for malignant neoplasm of prostate: Secondary | ICD-10-CM | POA: Diagnosis not present

## 2017-10-26 DIAGNOSIS — R011 Cardiac murmur, unspecified: Secondary | ICD-10-CM

## 2017-10-26 DIAGNOSIS — E785 Hyperlipidemia, unspecified: Secondary | ICD-10-CM | POA: Insufficient documentation

## 2017-10-26 DIAGNOSIS — I639 Cerebral infarction, unspecified: Secondary | ICD-10-CM

## 2017-10-26 DIAGNOSIS — F32A Depression, unspecified: Secondary | ICD-10-CM | POA: Insufficient documentation

## 2017-10-26 DIAGNOSIS — R269 Unspecified abnormalities of gait and mobility: Secondary | ICD-10-CM

## 2017-10-26 DIAGNOSIS — G8194 Hemiplegia, unspecified affecting left nondominant side: Secondary | ICD-10-CM

## 2017-10-26 DIAGNOSIS — F329 Major depressive disorder, single episode, unspecified: Secondary | ICD-10-CM

## 2017-10-26 DIAGNOSIS — N179 Acute kidney failure, unspecified: Secondary | ICD-10-CM

## 2017-10-26 LAB — T4, FREE: Free T4: 0.98 ng/dL (ref 0.60–1.60)

## 2017-10-26 LAB — PSA: PSA: 0.59 ng/mL (ref 0.10–4.00)

## 2017-10-26 LAB — CBC WITH DIFFERENTIAL/PLATELET
BASOS PCT: 0.5 % (ref 0.0–3.0)
Basophils Absolute: 0.1 10*3/uL (ref 0.0–0.1)
EOS ABS: 0.1 10*3/uL (ref 0.0–0.7)
EOS PCT: 1.1 % (ref 0.0–5.0)
HEMATOCRIT: 49.2 % (ref 39.0–52.0)
Hemoglobin: 16.5 g/dL (ref 13.0–17.0)
LYMPHS PCT: 23.7 % (ref 12.0–46.0)
Lymphs Abs: 3 10*3/uL (ref 0.7–4.0)
MCHC: 33.4 g/dL (ref 30.0–36.0)
MCV: 87.8 fl (ref 78.0–100.0)
MONOS PCT: 7.2 % (ref 3.0–12.0)
Monocytes Absolute: 0.9 10*3/uL (ref 0.1–1.0)
NEUTROS ABS: 8.6 10*3/uL — AB (ref 1.4–7.7)
Neutrophils Relative %: 67.5 % (ref 43.0–77.0)
PLATELETS: 262 10*3/uL (ref 150.0–400.0)
RBC: 5.6 Mil/uL (ref 4.22–5.81)
RDW: 14.8 % (ref 11.5–15.5)
WBC: 12.7 10*3/uL — ABNORMAL HIGH (ref 4.0–10.5)

## 2017-10-26 LAB — URINALYSIS, ROUTINE W REFLEX MICROSCOPIC
Bilirubin Urine: NEGATIVE
Hgb urine dipstick: NEGATIVE
Ketones, ur: NEGATIVE
Leukocytes, UA: NEGATIVE
Nitrite: NEGATIVE
RBC / HPF: NONE SEEN (ref 0–?)
SPECIFIC GRAVITY, URINE: 1.025 (ref 1.000–1.030)
Total Protein, Urine: NEGATIVE
Urine Glucose: NEGATIVE
Urobilinogen, UA: 0.2 (ref 0.0–1.0)
pH: 5.5 (ref 5.0–8.0)

## 2017-10-26 LAB — BASIC METABOLIC PANEL
BUN: 39 mg/dL — ABNORMAL HIGH (ref 6–23)
CHLORIDE: 97 meq/L (ref 96–112)
CO2: 32 meq/L (ref 19–32)
CREATININE: 2.1 mg/dL — AB (ref 0.40–1.50)
Calcium: 9.7 mg/dL (ref 8.4–10.5)
GFR: 35.15 mL/min — ABNORMAL LOW (ref >60.00)
Glucose, Bld: 117 mg/dL — ABNORMAL HIGH (ref 70–99)
Potassium: 3.9 mEq/L (ref 3.5–5.1)
Sodium: 138 mEq/L (ref 135–145)

## 2017-10-26 LAB — TSH: TSH: 0.61 u[IU]/mL (ref 0.35–4.50)

## 2017-10-26 LAB — MICROALBUMIN / CREATININE URINE RATIO
CREATININE, U: 207.7 mg/dL
MICROALB UR: 2.7 mg/dL — AB (ref 0.0–1.9)
Microalb Creat Ratio: 1.3 mg/g (ref 0.0–30.0)

## 2017-10-26 MED ORDER — BUPROPION HCL ER (XL) 300 MG PO TB24: 300.0000 mg | ORAL_TABLET | Freq: Every day | ORAL | 5 refills | Status: DC

## 2017-10-26 NOTE — Progress Notes (Signed)
If pt is at [facility] they should be the ones sending him for home PT.Otherwise, I do not know if he qualifies for it.

## 2017-10-26 NOTE — Progress Notes (Signed)
Pre visit review using our clinic review tool, if applicable. No additional management support is needed unless otherwise documented below in the visit note. 

## 2017-10-26 NOTE — Patient Instructions (Addendum)
F/u 4-6 weeks    Stroke Prevention Some medical conditions and behaviors are associated with a higher chance of having a stroke. You can help prevent a stroke by making nutrition, lifestyle, and other changes, including managing any medical conditions you may have. What nutrition changes can be made?  Eat healthy foods. You can do this by: ? Choosing foods high in fiber, such as fresh fruits and vegetables and whole grains. ? Eating at least 5 or more servings of fruits and vegetables a day. Try to fill half of your plate at each meal with fruits and vegetables. ? Choosing lean protein foods, such as lean cuts of meat, poultry without skin, fish, tofu, beans, and nuts. ? Eating low-fat dairy products. ? Avoiding foods that are high in salt (sodium). This can help lower blood pressure. ? Avoiding foods that have saturated fat, trans fat, and cholesterol. This can help prevent high cholesterol. ? Avoiding processed and premade foods.  Follow your health care provider's specific guidelines for losing weight, controlling high blood pressure (hypertension), lowering high cholesterol, and managing diabetes. These may include: ? Reducing your daily calorie intake. ? Limiting your daily sodium intake to 1,500 milligrams (mg). ? Using only healthy fats for cooking, such as olive oil, canola oil, or sunflower oil. ? Counting your daily carbohydrate intake. What lifestyle changes can be made?  Maintain a healthy weight. Talk to your health care provider about your ideal weight.  Get at least 30 minutes of moderate physical activity at least 5 days a week. Moderate activity includes brisk walking, biking, and swimming.  Do not use any products that contain nicotine or tobacco, such as cigarettes and e-cigarettes. If you need help quitting, ask your health care provider. It may also be helpful to avoid exposure to secondhand smoke.  Limit alcohol intake to no more than 1 drink a day for nonpregnant  women and 2 drinks a day for men. One drink equals 12 oz of beer, 5 oz of wine, or 1 oz of hard liquor.  Stop any illegal drug use.  Avoid taking birth control pills. Talk to your health care provider about the risks of taking birth control pills if: ? You are over 41 years old. ? You smoke. ? You get migraines. ? You have ever had a blood clot. What other changes can be made?  Manage your cholesterol levels. ? Eating a healthy diet is important for preventing high cholesterol. If cholesterol cannot be managed through diet alone, you may also need to take medicines. ? Take any prescribed medicines to control your cholesterol as told by your health care provider.  Manage your diabetes. ? Eating a healthy diet and exercising regularly are important parts of managing your blood sugar. If your blood sugar cannot be managed through diet and exercise, you may need to take medicines. ? Take any prescribed medicines to control your diabetes as told by your health care provider.  Control your hypertension. ? To reduce your risk of stroke, try to keep your blood pressure below 130/80. ? Eating a healthy diet and exercising regularly are an important part of controlling your blood pressure. If your blood pressure cannot be managed through diet and exercise, you may need to take medicines. ? Take any prescribed medicines to control hypertension as told by your health care provider. ? Ask your health care provider if you should monitor your blood pressure at home. ? Have your blood pressure checked every year, even if your blood pressure  is normal. Blood pressure increases with age and some medical conditions.  Get evaluated for sleep disorders (sleep apnea). Talk to your health care provider about getting a sleep evaluation if you snore a lot or have excessive sleepiness.  Take over-the-counter and prescription medicines only as told by your health care provider. Aspirin or blood thinners  (antiplatelets or anticoagulants) may be recommended to reduce your risk of forming blood clots that can lead to stroke.  Make sure that any other medical conditions you have, such as atrial fibrillation or atherosclerosis, are managed. What are the warning signs of a stroke? The warning signs of a stroke can be easily remembered as BEFAST.  B is for balance. Signs include: ? Dizziness. ? Loss of balance or coordination. ? Sudden trouble walking.  E is for eyes. Signs include: ? A sudden change in vision. ? Trouble seeing.  F is for face. Signs include: ? Sudden weakness or numbness of the face. ? The face or eyelid drooping to one side.  A is for arms. Signs include: ? Sudden weakness or numbness of the arm, usually on one side of the body.  S is for speech. Signs include: ? Trouble speaking (aphasia). ? Trouble understanding.  T is for time. ? These symptoms may represent a serious problem that is an emergency. Do not wait to see if the symptoms will go away. Get medical help right away. Call your local emergency services (911 in the [place] Do not drive yourself to the hospital.  Other signs of stroke may include: ? A sudden, severe headache with no known cause. ? Nausea or vomiting. ? Seizure.  Where to find more information: For more information, visit:  American Stroke Association: www.strokeassociation.org  National Stroke Association: www.stroke.org  Summary  You can prevent a stroke by eating healthy, exercising, not smoking, limiting alcohol intake, and managing any medical conditions you may have.  Do not use any products that contain nicotine or tobacco, such as cigarettes and e-cigarettes. If you need help quitting, ask your health care provider. It may also be helpful to avoid exposure to secondhand smoke.  Remember BEFAST for warning signs of stroke. Get help right away if you or a loved one has any of these signs. This information is not intended to  replace advice given to you by your health care provider. Make sure you discuss any questions you have with your health care provider. Document Released: 08/14/2004 Document Revised: 08/12/2016 Document Reviewed: 08/12/2016 Elsevier Interactive Patient Education  2018 [employer].  Major Depressive Disorder, Adult Major depressive disorder (MDD) is a mental health condition. MDD often makes you feel sad, hopeless, or helpless. MDD can also cause symptoms in your body. MDD can affect your:  Work.  School.  Relationships.  Other normal activities.  MDD can range from mild to very bad. It may occur once (single episode MDD). It can also occur many times (recurrent MDD). The main symptoms of MDD often include:  Feeling sad, depressed, or irritable most of the time.  Loss of interest.  MDD symptoms also include:  Sleeping too much or too little.  Eating too much or too little.  A change in your weight.  Feeling tired (fatigue) or having low energy.  Feeling worthless.  Feeling guilty.  Trouble making decisions.  Trouble thinking clearly.  Thoughts of suicide or harming others.  Feeling weak.  Feeling agitated.  Keeping yourself from being around other people (isolation).  Follow these instructions at home: Activity  Do these things as told by your doctor: ? Go back to your normal activities. ? Exercise regularly. ? Spend time outdoors. Alcohol  Talk with your doctor about how alcohol can affect your antidepressant medicines.  Do not drink alcohol. Or, limit how much alcohol you drink. ? This means no more than 1 drink a day for nonpregnant women and 2 drinks a day for men. One drink equals one of these:  12 oz of beer.  5 oz of wine.  1 oz of hard liquor. General instructions  Take over-the-counter and prescription medicines only as told by your doctor.  Eat a healthy diet.  Get plenty of sleep.  Find activities that you enjoy. Make time to do  them.  Think about joining a support group. Your doctor may be able to suggest a group for you.  Keep all follow-up visits as told by your doctor. This is important. Where to find more information:  [employer] on Mental Illness: ? www.nami.[place]: ? https://carter.com/  National Suicide Prevention Lifeline: ? 563-814-7340. This is free, 24-hour help. Contact a doctor if:  Your symptoms get worse.  You have new symptoms. Get help right away if:  You self-harm.  You see, hear, taste, smell, or feel things that are not present (hallucinate). If you ever feel like you may hurt yourself or others, or have thoughts about taking your own life, get help right away. You can go to your nearest emergency department or call:  Your local emergency services (911 in the [place]  A suicide crisis helpline, such as the National Suicide Prevention Lifeline: ? (862) 274-5964. This is open 24 hours a day.  This information is not intended to replace advice given to you by your health care provider. Make sure you discuss any questions you have with your health care provider. Document Released: 06/18/2015 Document Revised: 03/23/2016 Document Reviewed: 03/23/2016 Elsevier Interactive Patient Education  2017 [employer].

## 2017-10-29 DIAGNOSIS — I69354 Hemiplegia and hemiparesis following cerebral infarction affecting left non-dominant side: Secondary | ICD-10-CM | POA: Insufficient documentation

## 2017-10-29 DIAGNOSIS — I618 Other nontraumatic intracerebral hemorrhage: Secondary | ICD-10-CM | POA: Insufficient documentation

## 2017-10-31 ENCOUNTER — Encounter: Payer: Self-pay | Admitting: Internal Medicine

## 2017-10-31 NOTE — Discharge Summary (Signed)
[facility] [place] NAME: Jake Brown    MR#:  [id number]  DATE OF BIRTH:  1965/10/04  DATE OF ADMISSION:  10/15/2017 ADMITTING PHYSICIAN: Demetrios Loll, MD  DATE OF DISCHARGE: 10/16/2017  5:22 PM  PRIMARY CARE PHYSICIAN: McLean-Scocuzza, Nino Glow, MD   ADMISSION DIAGNOSIS:  Hypertensive emergency [I16.1] Acute CVA (cerebrovascular accident) [facility]) [I63.9] Right thalamic infarction ([place]) [I63.9]  DISCHARGE DIAGNOSIS:  Active Problems:   Acute CVA (cerebrovascular accident) ([place])   SECONDARY DIAGNOSIS:   Past Medical History:  Diagnosis Date  . Anxiety   . Arthritis   . Cancer of kidney [facility])    right renal carcinoma (nephrectomy )  . Chronic kidney disease   . Degenerative disc disease, cervical   . Degenerative disc disease, lumbar   . Dementia   . Difficult intubation    no problems 01/17/12-stated "small esophagus"  . Heart murmur 1983 noted   no symptoms  . Hyperlipidemia   . Hypertension   . Nephrolithiasis   . PONV (postoperative nausea and vomiting)   . PTSD (post-traumatic stress disorder)      ADMITTING HISTORY  HISTORY OF PRESENT ILLNESS:  Jake Brown  is a 52 y.o. male with a known history of hypertension, hyperlipidemia, CKD, kidney cancer and anxiety.  The patient presented to ED with above chief complaints.  The patient feels left arm and leg weakness yesterday.  He did not pay any attention but he has worsening left leg weakness with numbness and tingling since this morning.  He has any slurred speech, dysphagia or incontinence.  Was found high blood pressure at 200/121 in the ED.  CAT scan of the head showed possible acute right thalamic lunar lacunar infarct.  He is treated with aspirin in the ED.  He was evaluated by Dr. Kathlen Brunswick, neurologist in the ED.  He has he has only one kidney.  HOSPITAL COURSE:   * Acute right BG infarct Patient's weakness has improved significantly.  But with physical  therapy. Home health physical therapy has been ordered. Seen by neurology.  No large vessel occlusion.  Started on aspirin, statin.  Patient counseled to be compliant with medications and heart healthy diet.  *Hypertension uncontrolled.  Patient had stopped taking medication to not compliant.  Restarted antihypertension  Medications. Blood pressure improving.  Needs follow-up with primary care physician within a week.  Patient stable for discharge home with home health physical therapy.  Outpatient PCP follow-up.   CONSULTS OBTAINED:  Treatment Team:  Catarina Hartshorn, MD  DRUG ALLERGIES:  No Known Allergies  DISCHARGE MEDICATIONS:   Allergies as of 10/16/2017   No Known Allergies     Medication List    TAKE these medications   amLODipine 10 MG tablet Commonly known as:  NORVASC TAKE 1 TABLET (10 MG TOTAL) BY MOUTH DAILY.   aspirin EC 81 MG tablet Take 1 tablet (81 mg total) by mouth daily.   atorvastatin 40 MG tablet Commonly known as:  LIPITOR Take 1 tablet (40 mg total) by mouth daily at 6 PM. PATIENT NEEDS OFFICE VISIT/FASTING LABS FOR ADDITIONAL REFILLS What changed:    medication strength  how much to take  when to take this   chlorthalidone 25 MG tablet Commonly known as:  HYGROTON TAKE 1 TABLET (25 MG TOTAL) BY MOUTH DAILY.   fentaNYL 12 MCG/HR Commonly known as:  DURAGESIC - dosed mcg/hr Place 12.5 mcg onto the skin every 3 (three) days.   hydrALAZINE 25  MG tablet Commonly known as:  APRESOLINE Take 1 tablet (25 mg total) by mouth 3 (three) times daily.   methocarbamol 750 MG tablet Commonly known as:  ROBAXIN Take 750 mg by mouth 3 (three) times daily as needed for muscle spasms.   oxyCODONE-acetaminophen 10-325 MG tablet Commonly known as:  PERCOCET Take 1 tablet by mouth every 6 (six) hours as needed for pain.       Today   VITAL SIGNS:  Blood pressure (!) 163/103, pulse 63, temperature 97.8 F (36.6 C), temperature source Oral, resp.  rate 18, height 5\' 9"  (1.753 m), weight 124.7 kg (275 lb), SpO2 97 %.  I/O:  No intake or output data in the 24 hours ending 10/31/17 1830  PHYSICAL EXAMINATION:  Physical Exam  GENERAL:  52 y.o.-year-old patient lying in the bed with no acute distress.  LUNGS: Normal breath sounds bilaterally, no wheezing, rales,rhonchi or crepitation. No use of accessory muscles of respiration.  CARDIOVASCULAR: S1, S2 normal. No murmurs, rubs, or gallops.  ABDOMEN: Soft, non-tender, non-distended. Bowel sounds present. No organomegaly or mass.  NEUROLOGIC: Moves all 4 extremities.  Left-sided weakness PSYCHIATRIC: The patient is alert and oriented x 3.  SKIN: No obvious rash, lesion, or ulcer.   DATA REVIEW:   CBC Recent Labs  Lab 10/26/17 1426  WBC 12.7*  HGB 16.5  HCT 49.2  PLT 262.0    Chemistries  Recent Labs  Lab 10/26/17 1426  NA 138  K 3.9  CL 97  CO2 32  GLUCOSE 117*  BUN 39*  CREATININE 2.10*  CALCIUM 9.7    Cardiac Enzymes No results for input(s): TROPONINI in the last 168 hours.  Microbiology Results  Results for orders placed or performed in visit on 12/21/14  Culture, Group A Strep     Status: None   Collection Time: 12/21/14  5:24 PM  Result Value Ref Range Status   Organism ID, Bacteria Normal Upper Respiratory Flora  Final   Organism ID, Bacteria No Beta Hemolytic Streptococci Isolated  Final    RADIOLOGY:  No results found.  Follow up with PCP in 1 week.  Management plans discussed with the patient, family and they are in agreement.  CODE STATUS:  Code Status History    Date Active Date Inactive Code Status Order ID Comments User Context   10/15/2017 1844 10/16/2017 2048 Full Code [id number]  Demetrios Loll, MD Inpatient      TOTAL TIME TAKING CARE OF THIS PATIENT ON DAY OF DISCHARGE: more than 30 minutes.   Neita Carp M.D on 10/31/2017 at 6:30 PM  Between 7am to 6pm - Pager - [phone]  After 6pm go to [email or web address] - password [email or web address]  [facility]  Office  [phone]  CC: Primary care physician; McLean-Scocuzza, Nino Glow, MD  Note: This dictation was prepared with Dragon dictation along with smaller phrase technology. Any transcriptional errors that result from this process are unintentional.
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
## 2017-11-09 ENCOUNTER — Other Ambulatory Visit: Payer: Self-pay | Admitting: Internal Medicine

## 2017-11-09 DIAGNOSIS — I639 Cerebral infarction, unspecified: Secondary | ICD-10-CM

## 2017-11-09 DIAGNOSIS — G8194 Hemiplegia, unspecified affecting left nondominant side: Secondary | ICD-10-CM

## 2017-11-09 DIAGNOSIS — R269 Unspecified abnormalities of gait and mobility: Secondary | ICD-10-CM

## 2017-11-10 ENCOUNTER — Encounter: Payer: Self-pay | Admitting: Internal Medicine

## 2017-11-12 ENCOUNTER — Other Ambulatory Visit: Payer: Self-pay | Admitting: Internal Medicine

## 2017-11-12 ENCOUNTER — Encounter: Payer: Self-pay | Admitting: Internal Medicine

## 2017-11-12 ENCOUNTER — Ambulatory Visit: Payer: Managed Care, Other (non HMO) | Admitting: Internal Medicine

## 2017-11-12 VITALS — BP 132/90 | HR 113 | Temp 97.8°F | Ht 68.5 in | Wt 264.2 lb

## 2017-11-12 DIAGNOSIS — F329 Major depressive disorder, single episode, unspecified: Secondary | ICD-10-CM

## 2017-11-12 DIAGNOSIS — F339 Major depressive disorder, recurrent, unspecified: Secondary | ICD-10-CM | POA: Insufficient documentation

## 2017-11-12 DIAGNOSIS — N183 Chronic kidney disease, stage 3 unspecified: Secondary | ICD-10-CM

## 2017-11-12 DIAGNOSIS — J329 Chronic sinusitis, unspecified: Secondary | ICD-10-CM | POA: Insufficient documentation

## 2017-11-12 DIAGNOSIS — D72829 Elevated white blood cell count, unspecified: Secondary | ICD-10-CM

## 2017-11-12 DIAGNOSIS — E876 Hypokalemia: Secondary | ICD-10-CM

## 2017-11-12 DIAGNOSIS — J019 Acute sinusitis, unspecified: Secondary | ICD-10-CM | POA: Diagnosis not present

## 2017-11-12 DIAGNOSIS — I10 Essential (primary) hypertension: Secondary | ICD-10-CM | POA: Diagnosis not present

## 2017-11-12 DIAGNOSIS — I639 Cerebral infarction, unspecified: Secondary | ICD-10-CM

## 2017-11-12 DIAGNOSIS — N179 Acute kidney failure, unspecified: Secondary | ICD-10-CM | POA: Diagnosis not present

## 2017-11-12 DIAGNOSIS — F32A Depression, unspecified: Secondary | ICD-10-CM

## 2017-11-12 LAB — BASIC METABOLIC PANEL
BUN: 32 mg/dL — ABNORMAL HIGH (ref 6–23)
CHLORIDE: 96 meq/L (ref 96–112)
CO2: 32 meq/L (ref 19–32)
Calcium: 10 mg/dL (ref 8.4–10.5)
Creatinine, Ser: 1.73 mg/dL — ABNORMAL HIGH (ref 0.40–1.50)
GFR: 43.95 mL/min — ABNORMAL LOW (ref >60.00)
Glucose, Bld: 96 mg/dL (ref 70–99)
POTASSIUM: 2.9 meq/L — AB (ref 3.5–5.1)
Sodium: 136 mEq/L (ref 135–145)

## 2017-11-12 LAB — CBC WITH DIFFERENTIAL/PLATELET
BASOS PCT: 0.6 % (ref 0.0–3.0)
Basophils Absolute: 0.1 10*3/uL (ref 0.0–0.1)
EOS PCT: 1.5 % (ref 0.0–5.0)
Eosinophils Absolute: 0.2 10*3/uL (ref 0.0–0.7)
HEMATOCRIT: 47.3 % (ref 39.0–52.0)
Hemoglobin: 16.3 g/dL (ref 13.0–17.0)
LYMPHS PCT: 16.2 % (ref 12.0–46.0)
Lymphs Abs: 2.2 10*3/uL (ref 0.7–4.0)
MCHC: 34.6 g/dL (ref 30.0–36.0)
MCV: 86.3 fl (ref 78.0–100.0)
MONOS PCT: 7.3 % (ref 3.0–12.0)
Monocytes Absolute: 1 10*3/uL (ref 0.1–1.0)
NEUTROS ABS: 10.2 10*3/uL — AB (ref 1.4–7.7)
Neutrophils Relative %: 74.4 % (ref 43.0–77.0)
Platelets: 292 10*3/uL (ref 150.0–400.0)
RBC: 5.47 Mil/uL (ref 4.22–5.81)
RDW: 14.6 % (ref 11.5–15.5)
WBC: 13.7 10*3/uL — AB (ref 4.0–10.5)

## 2017-11-12 MED ORDER — DOXYCYCLINE HYCLATE 100 MG PO TABS: 100.0000 mg | ORAL_TABLET | Freq: Two times a day (BID) | ORAL | 0 refills | Status: DC

## 2017-11-12 MED ORDER — POTASSIUM CHLORIDE CRYS ER 20 MEQ PO TBCR: EXTENDED_RELEASE_TABLET | ORAL | 5 refills | Status: DC

## 2017-11-12 MED ORDER — LISINOPRIL 20 MG PO TABS: 10.0000 mg | ORAL_TABLET | Freq: Every day | ORAL | 1 refills | Status: DC

## 2017-11-12 MED ORDER — CHLORTHALIDONE 25 MG PO TABS: 12.5000 mg | ORAL_TABLET | Freq: Every day | ORAL | 0 refills | Status: DC
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
## 2017-11-12 NOTE — Progress Notes (Signed)
Pre visit review using our clinic review tool, if applicable. No additional management support is needed unless otherwise documented below in the visit note. 

## 2017-11-12 NOTE — Patient Instructions (Addendum)
Consider Benefiber or Metamucil for fiber or gummy fiber supplement  Miralax would be laxative for constipation over the counter   Try Meditation Insight Timer, Calm Headspace for mood on your phone    Chronic Kidney Disease, Adult Chronic kidney disease (CKD) happens when the kidneys are damaged during a time of 3 or more months. The kidneys are two organs that do many important jobs in the body. These jobs include:  Removing wastes and extra fluids from the blood.  Making hormones that maintain the amount of fluid in your tissues and blood vessels.  Making sure that the body has the right amount of fluids and chemicals.  Most of the time, this condition does not go away, but it can usually be controlled. Steps must be taken to slow down the kidney damage or stop it from getting worse. Otherwise, the kidneys may stop working. Follow these instructions at home:  Follow your diet as told by your doctor. You may need to avoid alcohol, salty foods (sodium), and foods that are high in potassium, calcium, and protein.  Take over-the-counter and prescription medicines only as told by your doctor. Do not take any new medicines unless your doctor says you can do that. These include vitamins and minerals. ? Medicines and nutritional supplements can make kidney damage worse. ? Your doctor may need to change how much medicine you take.  Do not use any tobacco products. These include cigarettes, chewing tobacco, and e-cigarettes. If you need help quitting, ask your doctor.  Keep all follow-up visits as told by your doctor. This is important.  Check your blood pressure. Tell your doctor if there are changes to your blood pressure.  Get to a healthy weight. Stay at that weight. If you need help with this, ask your doctor.  Start or continue an exercise plan. Try to exercise at least 30 minutes a day, 5 days a week.  Stay up-to-date with your shots (immunizations) as told by your doctor. Contact  a doctor if:  Your symptoms get worse.  You have new symptoms. Get help right away if:  You have symptoms of end-stage kidney disease. These include: ? Headaches. ? Skin that is darker or lighter than normal. ? Numbness in your hands or feet. ? Easy bruising. ? Having hiccups often. ? Chest pain. ? Shortness of breath. ? Stopping of menstrual periods in women.  You have a fever.  You are making very little pee (urine).  You have pain or bleeding when you pee (urinate). This information is not intended to replace advice given to you by your health care provider. Make sure you discuss any questions you have with your health care provider. Document Released: 10/01/2009 Document Revised: 12/13/2015 Document Reviewed: 03/05/2012 Elsevier Interactive Patient Education  2017 [employer].   Constipation, Adult Constipation is when a person has fewer bowel movements in a week than normal, has difficulty having a bowel movement, or has stools that are dry, hard, or larger than normal. Constipation may be caused by an underlying condition. It may become worse with age if a person takes certain medicines and does not take in enough fluids. Follow these instructions at home: Eating and drinking   Eat foods that have a lot of fiber, such as fresh fruits and vegetables, whole grains, and beans.  Limit foods that are high in fat, low in fiber, or overly processed, such as french fries, hamburgers, cookies, candies, and soda.  Drink enough fluid to keep your urine clear or  pale yellow. General instructions  Exercise regularly or as told by your health care provider.  Go to the restroom when you have the urge to go. Do not hold it in.  Take over-the-counter and prescription medicines only as told by your health care provider. These include any fiber supplements.  Practice pelvic floor retraining exercises, such as deep breathing while relaxing the lower abdomen and pelvic floor  relaxation during bowel movements.  Watch your condition for any changes.  Keep all follow-up visits as told by your health care provider. This is important. Contact a health care provider if:  You have pain that gets worse.  You have a fever.  You do not have a bowel movement after 4 days.  You vomit.  You are not hungry.  You lose weight.  You are bleeding from the anus.  You have thin, pencil-like stools. Get help right away if:  You have a fever and your symptoms suddenly get worse.  You leak stool or have blood in your stool.  Your abdomen is bloated.  You have severe pain in your abdomen.  You feel dizzy or you faint. This information is not intended to replace advice given to you by your health care provider. Make sure you discuss any questions you have with your health care provider. Document Released: 04/04/2004 Document Revised: 01/25/2016 Document Reviewed: 12/26/2015 Elsevier Interactive Patient Education  2018 [employer].

## 2017-11-23 ENCOUNTER — Ambulatory Visit: Payer: Managed Care, Other (non HMO) | Admitting: Internal Medicine

## 2017-11-26 ENCOUNTER — Encounter: Payer: Self-pay | Admitting: Physical Therapy

## 2017-11-26 ENCOUNTER — Other Ambulatory Visit: Payer: Self-pay

## 2017-11-26 ENCOUNTER — Ambulatory Visit: Payer: Managed Care, Other (non HMO) | Attending: Internal Medicine | Admitting: Physical Therapy

## 2017-11-26 DIAGNOSIS — M6281 Muscle weakness (generalized): Secondary | ICD-10-CM

## 2017-11-26 DIAGNOSIS — R262 Difficulty in walking, not elsewhere classified: Secondary | ICD-10-CM
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
## 2017-11-30 ENCOUNTER — Ambulatory Visit: Payer: Managed Care, Other (non HMO) | Admitting: Physical Therapy

## 2017-12-02 ENCOUNTER — Encounter: Payer: Self-pay | Admitting: Physical Therapy

## 2017-12-02 ENCOUNTER — Ambulatory Visit: Payer: Managed Care, Other (non HMO) | Admitting: Physical Therapy

## 2017-12-02 DIAGNOSIS — M6281 Muscle weakness (generalized): Secondary | ICD-10-CM

## 2017-12-02 DIAGNOSIS — R262 Difficulty in walking, not elsewhere classified: Secondary | ICD-10-CM | POA: Diagnosis not present
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
## 2017-12-07 ENCOUNTER — Ambulatory Visit: Payer: Managed Care, Other (non HMO)

## 2017-12-07 DIAGNOSIS — R262 Difficulty in walking, not elsewhere classified: Secondary | ICD-10-CM

## 2017-12-07 DIAGNOSIS — M6281 Muscle weakness (generalized): Secondary | ICD-10-CM
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
## 2017-12-09 ENCOUNTER — Ambulatory Visit: Payer: Managed Care, Other (non HMO)

## 2017-12-11 ENCOUNTER — Encounter: Payer: Self-pay | Admitting: Internal Medicine

## 2017-12-11 ENCOUNTER — Ambulatory Visit: Payer: Managed Care, Other (non HMO) | Admitting: Internal Medicine

## 2017-12-11 VITALS — BP 130/92 | HR 108 | Temp 98.2°F | Ht 68.5 in | Wt 266.0 lb

## 2017-12-11 DIAGNOSIS — R112 Nausea with vomiting, unspecified: Secondary | ICD-10-CM | POA: Diagnosis not present

## 2017-12-11 DIAGNOSIS — G894 Chronic pain syndrome: Secondary | ICD-10-CM

## 2017-12-11 DIAGNOSIS — I10 Essential (primary) hypertension: Secondary | ICD-10-CM

## 2017-12-11 DIAGNOSIS — J309 Allergic rhinitis, unspecified: Secondary | ICD-10-CM | POA: Diagnosis not present

## 2017-12-11 DIAGNOSIS — F419 Anxiety disorder, unspecified: Secondary | ICD-10-CM

## 2017-12-11 DIAGNOSIS — E876 Hypokalemia: Secondary | ICD-10-CM | POA: Diagnosis not present

## 2017-12-11 DIAGNOSIS — D72829 Elevated white blood cell count, unspecified: Secondary | ICD-10-CM

## 2017-12-11 DIAGNOSIS — K5900 Constipation, unspecified: Secondary | ICD-10-CM | POA: Diagnosis not present

## 2017-12-11 DIAGNOSIS — I639 Cerebral infarction, unspecified: Secondary | ICD-10-CM

## 2017-12-11 DIAGNOSIS — E785 Hyperlipidemia, unspecified: Secondary | ICD-10-CM | POA: Diagnosis not present

## 2017-12-11 DIAGNOSIS — R0982 Postnasal drip: Secondary | ICD-10-CM

## 2017-12-11 DIAGNOSIS — Z85528 Personal history of other malignant neoplasm of kidney: Secondary | ICD-10-CM

## 2017-12-11 DIAGNOSIS — F32A Depression, unspecified: Secondary | ICD-10-CM

## 2017-12-11 DIAGNOSIS — N183 Chronic kidney disease, stage 3 unspecified: Secondary | ICD-10-CM

## 2017-12-11 DIAGNOSIS — F329 Major depressive disorder, single episode, unspecified: Secondary | ICD-10-CM

## 2017-12-11 LAB — CBC WITH DIFFERENTIAL/PLATELET
Basophils Absolute: 0 10*3/uL (ref 0.0–0.1)
Basophils Relative: 0.4 % (ref 0.0–3.0)
EOS ABS: 0.3 10*3/uL (ref 0.0–0.7)
Eosinophils Relative: 3.4 % (ref 0.0–5.0)
HEMATOCRIT: 40.9 % (ref 39.0–52.0)
HEMOGLOBIN: 14 g/dL (ref 13.0–17.0)
LYMPHS PCT: 25.4 % (ref 12.0–46.0)
Lymphs Abs: 2 10*3/uL (ref 0.7–4.0)
MCHC: 34.3 g/dL (ref 30.0–36.0)
MCV: 88.1 fl (ref 78.0–100.0)
MONO ABS: 0.5 10*3/uL (ref 0.1–1.0)
Monocytes Relative: 6.5 % (ref 3.0–12.0)
Neutro Abs: 5.1 10*3/uL (ref 1.4–7.7)
Neutrophils Relative %: 64.3 % (ref 43.0–77.0)
Platelets: 239 10*3/uL (ref 150.0–400.0)
RBC: 4.64 Mil/uL (ref 4.22–5.81)
RDW: 15 % (ref 11.5–15.5)
WBC: 7.9 10*3/uL (ref 4.0–10.5)

## 2017-12-11 LAB — BASIC METABOLIC PANEL
BUN: 26 mg/dL — ABNORMAL HIGH (ref 6–23)
CALCIUM: 9.5 mg/dL (ref 8.4–10.5)
CHLORIDE: 100 meq/L (ref 96–112)
CO2: 32 meq/L (ref 19–32)
CREATININE: 1.89 mg/dL — AB (ref 0.40–1.50)
GFR: 39.68 mL/min — ABNORMAL LOW (ref >60.00)
Glucose, Bld: 113 mg/dL — ABNORMAL HIGH (ref 70–99)
POTASSIUM: 4.2 meq/L (ref 3.5–5.1)
SODIUM: 139 meq/L (ref 135–145)

## 2017-12-11 LAB — MAGNESIUM: MAGNESIUM: 2.2 mg/dL (ref 1.5–2.5)

## 2017-12-11 MED ORDER — AMLODIPINE BESYLATE 10 MG PO TABS: 10.0000 mg | ORAL_TABLET | Freq: Every day | ORAL | 3 refills | Status: DC

## 2017-12-11 MED ORDER — RANITIDINE HCL 150 MG PO TABS: 150.0000 mg | ORAL_TABLET | Freq: Two times a day (BID) | ORAL | 0 refills | Status: DC

## 2017-12-11 MED ORDER — ATORVASTATIN CALCIUM 40 MG PO TABS: 40.0000 mg | ORAL_TABLET | Freq: Every day | ORAL | 3 refills | Status: DC

## 2017-12-11 MED ORDER — LISINOPRIL 10 MG PO TABS: 10.0000 mg | ORAL_TABLET | Freq: Every day | ORAL | 3 refills | Status: DC
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
## 2017-12-11 NOTE — Patient Instructions (Addendum)
Try nasal saline continue allegra 60 mg 2x per day or 180 total  We referred you to [facility] kidney doctor in [place]  Try Zantac 2x per day  Lisinopril will be 10 mg daily, chlorthalidone 12.5 1/2 pill of 25 and norvasc 10 mg daily  Try colace/senna for constipation or miralax or warm prune juice  Consider atrovent nasal spray for post nasal drip in the future  Follow up in 2 months     Nausea, Adult Nausea is the feeling of an upset stomach or having to vomit. Nausea on its own is not usually a serious concern, but it may be an early sign of a more serious medical problem. As nausea gets worse, it can lead to vomiting. If vomiting develops, or if you are not able to drink enough fluids, you are at risk of becoming dehydrated. Dehydration can make you tired and thirsty, cause you to have a dry mouth, and decrease how often you urinate. Older adults and people with other diseases or a weak immune system are at higher risk for dehydration. The main goals of treating your nausea are:  To limit repeated nausea episodes.  To prevent vomiting and dehydration.  Follow these instructions at home: Follow instructions from your health care provider about how to care for yourself at home. Eating and drinking Follow these recommendations as told by your health care provider:  Take an oral rehydration solution (ORS). This is a drink that is sold at pharmacies and retail stores.  Drink clear fluids in small amounts as you are able. Clear fluids include water, ice chips, diluted fruit juice, and low-calorie sports drinks.  Eat bland, easy-to-digest foods in small amounts as you are able. These foods include bananas, applesauce, rice, lean meats, toast, and crackers.  Avoid drinking fluids that contain a lot of sugar or caffeine, such as energy drinks, sports drinks, and soda.  Avoid alcohol.  Avoid spicy or fatty foods.  General instructions  Drink enough fluid to keep your urine clear or pale  yellow.  Wash your hands often. If soap and water are not available, use hand sanitizer.  Make sure that all people in your household wash their hands well and often.  Rest at home while you recover.  Take over-the-counter and prescription medicines only as told by your health care provider.  Breathe slowly and deeply when you feel nauseous.  Watch your condition for any changes.  Keep all follow-up visits as told by your health care provider. This is important. Contact a health care provider if:  You have a headache.  You have new symptoms.  Your nausea gets worse.  You have a fever.  You feel light-headed or dizzy.  You vomit.  You cannot keep fluids down. Get help right away if:  You have pain in your chest, neck, arm, or jaw.  You feel extremely weak or you faint.  You have vomit that is bright red or looks like coffee grounds.  You have bloody or black stools or stools that look like tar.  You have a severe headache, a stiff neck, or both.  You have severe pain, cramping, or bloating in your abdomen.  You have a rash.  You have difficulty breathing or are breathing very quickly.  Your heart is beating very quickly.  Your skin feels cold and clammy.  You feel confused.  You have pain when you urinate.  You have signs of dehydration, such as: ? Dark urine, very little, or no  urine. ? Cracked lips. ? Dry mouth. ? Sunken eyes. ? Sleepiness. ? Weakness. These symptoms may represent a serious problem that is an emergency. Do not wait to see if the symptoms will go away. Get medical help right away. Call your local emergency services (911 in the [place] Do not drive yourself to the hospital. This information is not intended to replace advice given to you by your health care provider. Make sure you discuss any questions you have with your health care provider. Document Released: 08/14/2004 Document Revised: 12/10/2015 Document Reviewed:  03/13/2015 Elsevier Interactive Patient Education  2018 [employer].  Constipation, Adult Constipation is when a person has fewer bowel movements in a week than normal, has difficulty having a bowel movement, or has stools that are dry, hard, or larger than normal. Constipation may be caused by an underlying condition. It may become worse with age if a person takes certain medicines and does not take in enough fluids. Follow these instructions at home: Eating and drinking   Eat foods that have a lot of fiber, such as fresh fruits and vegetables, whole grains, and beans.  Limit foods that are high in fat, low in fiber, or overly processed, such as french fries, hamburgers, cookies, candies, and soda.  Drink enough fluid to keep your urine clear or pale yellow. General instructions  Exercise regularly or as told by your health care provider.  Go to the restroom when you have the urge to go. Do not hold it in.  Take over-the-counter and prescription medicines only as told by your health care provider. These include any fiber supplements.  Practice pelvic floor retraining exercises, such as deep breathing while relaxing the lower abdomen and pelvic floor relaxation during bowel movements.  Watch your condition for any changes.  Keep all follow-up visits as told by your health care provider. This is important. Contact a health care provider if:  You have pain that gets worse.  You have a fever.  You do not have a bowel movement after 4 days.  You vomit.  You are not hungry.  You lose weight.  You are bleeding from the anus.  You have thin, pencil-like stools. Get help right away if:  You have a fever and your symptoms suddenly get worse.  You leak stool or have blood in your stool.  Your abdomen is bloated.  You have severe pain in your abdomen.  You feel dizzy or you faint. This information is not intended to replace advice given to you by your health care  provider. Make sure you discuss any questions you have with your health care provider. Document Released: 04/04/2004 Document Revised: 01/25/2016 Document Reviewed: 12/26/2015 Elsevier Interactive Patient Education  2018 [employer].   Chronic Kidney Disease, Adult Chronic kidney disease (CKD) happens when the kidneys are damaged during a time of 3 or more months. The kidneys are two organs that do many important jobs in the body. These jobs include:  Removing wastes and extra fluids from the blood.  Making hormones that maintain the amount of fluid in your tissues and blood vessels.  Making sure that the body has the right amount of fluids and chemicals.  Most of the time, this condition does not go away, but it can usually be controlled. Steps must be taken to slow down the kidney damage or stop it from getting worse. Otherwise, the kidneys may stop working. Follow these instructions at home:  Follow your diet as told by your doctor. You  may need to avoid alcohol, salty foods (sodium), and foods that are high in potassium, calcium, and protein.  Take over-the-counter and prescription medicines only as told by your doctor. Do not take any new medicines unless your doctor says you can do that. These include vitamins and minerals. ? Medicines and nutritional supplements can make kidney damage worse. ? Your doctor may need to change how much medicine you take.  Do not use any tobacco products. These include cigarettes, chewing tobacco, and e-cigarettes. If you need help quitting, ask your doctor.  Keep all follow-up visits as told by your doctor. This is important.  Check your blood pressure. Tell your doctor if there are changes to your blood pressure.  Get to a healthy weight. Stay at that weight. If you need help with this, ask your doctor.  Start or continue an exercise plan. Try to exercise at least 30 minutes a day, 5 days a week.  Stay up-to-date with your shots  (immunizations) as told by your doctor. Contact a doctor if:  Your symptoms get worse.  You have new symptoms. Get help right away if:  You have symptoms of end-stage kidney disease. These include: ? Headaches. ? Skin that is darker or lighter than normal. ? Numbness in your hands or feet. ? Easy bruising. ? Having hiccups often. ? Chest pain. ? Shortness of breath. ? Stopping of menstrual periods in women.  You have a fever.  You are making very little pee (urine).  You have pain or bleeding when you pee (urinate). This information is not intended to replace advice given to you by your health care provider. Make sure you discuss any questions you have with your health care provider. Document Released: 10/01/2009 Document Revised: 12/13/2015 Document Reviewed: 03/05/2012 Elsevier Interactive Patient Education  2017 [employer].

## 2017-12-11 NOTE — Progress Notes (Signed)
Pre visit review using our clinic review tool, if applicable. No additional management support is needed unless otherwise documented below in the visit note. 

## 2017-12-13 ENCOUNTER — Telehealth: Payer: Self-pay | Admitting: Internal Medicine

## 2017-12-13 ENCOUNTER — Encounter: Payer: Self-pay | Admitting: Internal Medicine

## 2017-12-13 DIAGNOSIS — K5900 Constipation, unspecified: Secondary | ICD-10-CM | POA: Insufficient documentation

## 2017-12-13 MED ORDER — CARVEDILOL 6.25 MG PO TABS: 6.2500 mg | ORAL_TABLET | Freq: Two times a day (BID) | ORAL | 2 refills | Status: DC

## 2017-12-13 NOTE — Telephone Encounter (Signed)
Due to your kidney function I want to stop chlorthalidone which can make you are risk for declining kidney function and start Coreg 6.25 mg 2x per day which will not effect kidney function Please try to check your blood pressure and make sure <140/<90 before next visit   Stop chlorthalidone new medication sent to your pharmacy   [place]

## 2017-12-15 NOTE — Telephone Encounter (Signed)
Left message for patient to return call back. [facility] may give information.  

## 2017-12-16 ENCOUNTER — Ambulatory Visit: Payer: Managed Care, Other (non HMO)

## 2017-12-16 ENCOUNTER — Telehealth: Payer: Self-pay

## 2017-12-16 VITALS — BP 135/104 | HR 111

## 2017-12-16 DIAGNOSIS — M6281 Muscle weakness (generalized): Secondary | ICD-10-CM

## 2017-12-16 DIAGNOSIS — R262 Difficulty in walking, not elsewhere classified: Secondary | ICD-10-CM

## 2017-12-16 NOTE — Telephone Encounter (Signed)
Janna Arch with [facility] called to report pt. Is there for therapy, but they held treatment because of vital signs. Took them 3 x - BP 159/108 Pulse 111 BP 135/104 Pulse 111 BP 128/99   Pulse  115. Pt. Is asymptomatic - no complaints.
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
## 2017-12-16 NOTE — Telephone Encounter (Signed)
fyi

## 2017-12-17 ENCOUNTER — Other Ambulatory Visit: Payer: Self-pay | Admitting: Internal Medicine

## 2017-12-17 DIAGNOSIS — I10 Essential (primary) hypertension: Secondary | ICD-10-CM

## 2017-12-17 MED ORDER — CARVEDILOL 12.5 MG PO TABS: 12.5000 mg | ORAL_TABLET | Freq: Two times a day (BID) | ORAL | 0 refills | Status: DC

## 2017-12-17 NOTE — Telephone Encounter (Signed)
Increase coreg to 12.5 mg bid if has 6.25 can take 2 bid  Stop chlorthalidone  Continue other medications   Call pt and inform and also PT  [place]

## 2017-12-18 NOTE — Telephone Encounter (Signed)
Left message for patient to return call to office, [place] nurse may advise patient.

## 2017-12-21 ENCOUNTER — Encounter: Payer: Self-pay | Admitting: Occupational Therapy

## 2017-12-21 ENCOUNTER — Ambulatory Visit: Payer: Managed Care, Other (non HMO) | Admitting: Occupational Therapy

## 2017-12-21 ENCOUNTER — Ambulatory Visit: Payer: Managed Care, Other (non HMO) | Attending: Internal Medicine

## 2017-12-21 ENCOUNTER — Other Ambulatory Visit: Payer: Self-pay

## 2017-12-21 DIAGNOSIS — R278 Other lack of coordination: Secondary | ICD-10-CM | POA: Insufficient documentation

## 2017-12-21 DIAGNOSIS — R262 Difficulty in walking, not elsewhere classified: Secondary | ICD-10-CM | POA: Diagnosis present

## 2017-12-21 DIAGNOSIS — M6281 Muscle weakness (generalized): Secondary | ICD-10-CM | POA: Diagnosis present
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
## 2017-12-23 ENCOUNTER — Ambulatory Visit: Payer: Managed Care, Other (non HMO) | Admitting: Occupational Therapy

## 2017-12-23 ENCOUNTER — Ambulatory Visit: Payer: Managed Care, Other (non HMO) | Admitting: Physical Therapy

## 2017-12-25 NOTE — Telephone Encounter (Signed)
Closing note due to patient not returning call back.

## 2017-12-28 ENCOUNTER — Encounter: Payer: Self-pay | Admitting: Occupational Therapy

## 2017-12-28 ENCOUNTER — Ambulatory Visit: Payer: Managed Care, Other (non HMO) | Admitting: Occupational Therapy

## 2017-12-28 ENCOUNTER — Encounter: Payer: Self-pay | Admitting: Physical Therapy

## 2017-12-28 ENCOUNTER — Other Ambulatory Visit: Payer: Self-pay

## 2017-12-28 ENCOUNTER — Ambulatory Visit: Payer: Managed Care, Other (non HMO) | Admitting: Physical Therapy

## 2017-12-28 DIAGNOSIS — R278 Other lack of coordination: Secondary | ICD-10-CM

## 2017-12-28 DIAGNOSIS — M6281 Muscle weakness (generalized): Secondary | ICD-10-CM

## 2017-12-28 DIAGNOSIS — R262 Difficulty in walking, not elsewhere classified: Secondary | ICD-10-CM
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
## 2017-12-28 NOTE — Patient Instructions (Signed)
HIP: Extension (Isometric)    Lie on back with legs straight. Tighten glutes. Bend opposite knee and raise leg . _10__ reps per set, __2_ sets per day, __7_ days per week   Copyright  VHI. All rights reserved.  HIP: Abduction - Side-Lying    Lie on side, legs straight and in line with trunk. Squeeze glutes. Raise top leg up and slightly back. Point toes forward. __15_ reps per set, __2_ sets per day, _7__ days per week Bend bottom leg to stabilize pelvis.  Copyright  VHI. All rights reserved.  HIP: Abduction / External Rotation (Band)    Place band around knees. Lie on side with hips and knees bent. Raise top knee up, squeezing glutes. Keep feet together. Hold ___ seconds. Use ______yellow__ band. __15_ reps per set,2 ___ sets per day, 7___ days per week   Copyright  VHI. All rights reserved.  Hip Abduction: Modified       Copyright  VHI. All rights reserved.  Bridge    Lying on back, legs bent 90, feet flat on floor. Press up hips and torso, reaching hands to feet. 10 reps x 2 sets , 7 days / week    Copyright  VHI. All rights reserved.

## 2017-12-30 ENCOUNTER — Ambulatory Visit: Payer: Managed Care, Other (non HMO) | Admitting: Physical Therapy

## 2017-12-30 ENCOUNTER — Encounter: Payer: Self-pay | Admitting: Physical Therapy

## 2017-12-30 DIAGNOSIS — R262 Difficulty in walking, not elsewhere classified: Secondary | ICD-10-CM

## 2017-12-30 DIAGNOSIS — M6281 Muscle weakness (generalized): Secondary | ICD-10-CM

## 2017-12-30 DIAGNOSIS — R278 Other lack of coordination: Secondary | ICD-10-CM
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
## 2017-12-30 NOTE — Patient Instructions (Signed)
     SIT TO STAND: No Device    Sit with feet shoulder-width apart, on floor. Lean chest forward, raise hips up from surface. Straighten hips and knees. Weight bear equally on left and right sides. __15_ reps per set, __2_ sets per day, _7Hip Extension (Standing)    Stand with support. Squeeze pelvic floor and hold. Move right leg backward with straight knee. Hold for ___ seconds. Relax for ___ seconds. Repeat __15_ times. Do 2___ times a day. Repeat with other leg. .   Copyright  VHI. All rights reserved.  Hip Abduction / Adduction: with Extended Knee (Supine)    15 reps, 2 x day , 7 days / week.  http://orth.exer.us/680     Copyright  VHI. All rights reserved.  Marland Kitchen

## 2018-01-04 ENCOUNTER — Ambulatory Visit: Payer: Managed Care, Other (non HMO) | Admitting: Occupational Therapy

## 2018-01-04 ENCOUNTER — Encounter: Payer: Self-pay | Admitting: Occupational Therapy

## 2018-01-04 ENCOUNTER — Ambulatory Visit: Payer: Managed Care, Other (non HMO)

## 2018-01-04 VITALS — BP 120/88 | HR 86

## 2018-01-04 DIAGNOSIS — R262 Difficulty in walking, not elsewhere classified: Secondary | ICD-10-CM | POA: Diagnosis not present

## 2018-01-04 DIAGNOSIS — M6281 Muscle weakness (generalized): Secondary | ICD-10-CM

## 2018-01-04 DIAGNOSIS — R278 Other lack of coordination: Secondary | ICD-10-CM
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
## 2018-01-06 ENCOUNTER — Ambulatory Visit: Payer: Managed Care, Other (non HMO) | Admitting: Occupational Therapy

## 2018-01-06 ENCOUNTER — Ambulatory Visit: Payer: Managed Care, Other (non HMO)

## 2018-01-06 ENCOUNTER — Encounter: Payer: Self-pay | Admitting: Occupational Therapy

## 2018-01-06 VITALS — BP 133/97 | HR 99

## 2018-01-06 DIAGNOSIS — R278 Other lack of coordination: Secondary | ICD-10-CM

## 2018-01-06 DIAGNOSIS — R262 Difficulty in walking, not elsewhere classified: Secondary | ICD-10-CM | POA: Diagnosis not present

## 2018-01-06 DIAGNOSIS — M6281 Muscle weakness (generalized): Secondary | ICD-10-CM
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
## 2018-01-11 ENCOUNTER — Encounter: Payer: Self-pay | Admitting: Occupational Therapy

## 2018-01-11 ENCOUNTER — Ambulatory Visit: Payer: Managed Care, Other (non HMO) | Admitting: Occupational Therapy

## 2018-01-11 ENCOUNTER — Ambulatory Visit: Payer: Managed Care, Other (non HMO) | Admitting: Physical Therapy

## 2018-01-11 DIAGNOSIS — R262 Difficulty in walking, not elsewhere classified: Secondary | ICD-10-CM

## 2018-01-11 DIAGNOSIS — R278 Other lack of coordination: Secondary | ICD-10-CM

## 2018-01-11 DIAGNOSIS — M6281 Muscle weakness (generalized): Secondary | ICD-10-CM
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
## 2018-01-13 ENCOUNTER — Ambulatory Visit: Payer: Managed Care, Other (non HMO) | Admitting: Occupational Therapy

## 2018-01-13 ENCOUNTER — Ambulatory Visit: Payer: Managed Care, Other (non HMO)

## 2018-01-13 ENCOUNTER — Ambulatory Visit: Payer: Self-pay

## 2018-01-13 DIAGNOSIS — R278 Other lack of coordination: Secondary | ICD-10-CM

## 2018-01-13 DIAGNOSIS — R262 Difficulty in walking, not elsewhere classified: Secondary | ICD-10-CM

## 2018-01-13 DIAGNOSIS — M6281 Muscle weakness (generalized): Secondary | ICD-10-CM
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
## 2018-01-13 NOTE — Telephone Encounter (Signed)
rec'd call from pt. to report elevated BP readings today, while at OT session.  Reported, due to intermittent Vertigo and morning nausea with taking AM medications, he chooses to take them later on OT days. Reported serial BP readings today: 156/119 (L) arm, and 145/100 (R) arm @ 9:40 AM;  3rd BP at OT: 160/120.  Reported he did not have any chest pain, shortness of breath, weakness, dizziness, headache, or other symptoms at that time.  Also reported he noted that he was delayed in changing the Fentanyl patch for spinal stenosis; it had been longer than 3 days. Reported he did have increased pain, prior to going to OT, and wondered about either the new patch, or the increased pain that could have contributed to elevated BP's this morning.  Stated he got home around 10:30 AM today, and took all of his morning medication.  Reported BP at 1:30 of 115/90.    Per protocol, advised he should f/u within the next 2 weeks with PCP.  Stated he has a scheduled f/u appt. on 02/10/18   Advised since there was no available appt. with Dr. Aundra Dubin, prior to 7/24, will send Triage note to Dr. Aundra Dubin, and make aware of recent elevated BP readings.  Care advice given per protocol.  Advised to call if BP elevates above 160/100, or worsening symptoms.   Pt. Verb. Understanding; agrees with plan.       Reason for Disposition . [6] Systolic BP  >= 314 OR Diastolic >= 90 AND [9] taking BP medications  Answer Assessment - Initial Assessment Questions 1. BLOOD PRESSURE: "What is the blood pressure?" "Did you take at least two measurements 5 minutes apart?"     156/119 (L) @ 9:40 AM, and 145/100 (R) @ 9:40 AM  2. ONSET: "When did you take your blood pressure?"     See above 3. HOW: "How did you obtain the blood pressure?" (e.g., visiting nurse, automatic home BP monitor)     Above readings were checked at OT session this morning; digital cuff 4. HISTORY: "Do you have a history of high blood pressure?"     Yes 5. MEDICATIONS:  "Are you taking any medications for blood pressure?" "Have you missed any doses recently?"     Yes; on days of therapy, he takes AM medication later in the morning, due to Vertigo and combination of his morning medications.  6. OTHER SYMPTOMS: "Do you have any symptoms?" (e.g., headache, chest pain, blurred vision, difficulty breathing, weakness)     Denied chest pain, shortness of breath, blurred vision, headache, or dizziness  Delayed taking his morning medication until 10:30 AM, following returning home from OT session. Rechecked BP at home at 1:30 PM; reading 115/90  Protocols used: HIGH BLOOD PRESSURE-A-AH

## 2018-01-18 ENCOUNTER — Ambulatory Visit: Payer: Managed Care, Other (non HMO) | Admitting: Occupational Therapy

## 2018-01-18 ENCOUNTER — Ambulatory Visit: Payer: Managed Care, Other (non HMO) | Attending: Internal Medicine

## 2018-01-18 ENCOUNTER — Encounter: Payer: Self-pay | Admitting: Occupational Therapy

## 2018-01-18 DIAGNOSIS — R278 Other lack of coordination: Secondary | ICD-10-CM | POA: Diagnosis present

## 2018-01-18 DIAGNOSIS — M6281 Muscle weakness (generalized): Secondary | ICD-10-CM | POA: Diagnosis present

## 2018-01-18 DIAGNOSIS — R262 Difficulty in walking, not elsewhere classified: Secondary | ICD-10-CM | POA: Diagnosis present

## 2018-01-18 DIAGNOSIS — R293 Abnormal posture: Secondary | ICD-10-CM | POA: Diagnosis present

## 2018-01-18 DIAGNOSIS — R252 Cramp and spasm: Secondary | ICD-10-CM | POA: Insufficient documentation

## 2018-01-18 DIAGNOSIS — M542 Cervicalgia: Secondary | ICD-10-CM | POA: Insufficient documentation
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
## 2018-01-20 ENCOUNTER — Other Ambulatory Visit: Payer: Self-pay | Admitting: Internal Medicine

## 2018-01-20 ENCOUNTER — Encounter: Payer: Self-pay | Admitting: Occupational Therapy

## 2018-01-20 ENCOUNTER — Ambulatory Visit: Payer: Managed Care, Other (non HMO) | Admitting: Occupational Therapy

## 2018-01-20 ENCOUNTER — Ambulatory Visit: Payer: Managed Care, Other (non HMO)

## 2018-01-20 DIAGNOSIS — M6281 Muscle weakness (generalized): Secondary | ICD-10-CM

## 2018-01-20 DIAGNOSIS — I10 Essential (primary) hypertension: Secondary | ICD-10-CM

## 2018-01-20 DIAGNOSIS — R278 Other lack of coordination: Secondary | ICD-10-CM

## 2018-01-20 DIAGNOSIS — R262 Difficulty in walking, not elsewhere classified: Secondary | ICD-10-CM

## 2018-01-20 MED ORDER — CARVEDILOL 12.5 MG PO TABS: 12.5000 mg | ORAL_TABLET | Freq: Two times a day (BID) | ORAL | 0 refills | Status: DC
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
## 2018-01-25 ENCOUNTER — Ambulatory Visit: Payer: Managed Care, Other (non HMO) | Admitting: Occupational Therapy

## 2018-01-25 ENCOUNTER — Ambulatory Visit: Payer: Managed Care, Other (non HMO)

## 2018-01-25 ENCOUNTER — Encounter: Payer: Self-pay | Admitting: Occupational Therapy

## 2018-01-25 DIAGNOSIS — R278 Other lack of coordination: Secondary | ICD-10-CM

## 2018-01-25 DIAGNOSIS — M6281 Muscle weakness (generalized): Secondary | ICD-10-CM

## 2018-01-25 DIAGNOSIS — R262 Difficulty in walking, not elsewhere classified: Secondary | ICD-10-CM
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
## 2018-01-27 ENCOUNTER — Ambulatory Visit: Payer: Managed Care, Other (non HMO) | Admitting: Occupational Therapy

## 2018-01-27 ENCOUNTER — Ambulatory Visit: Payer: Managed Care, Other (non HMO)

## 2018-02-02 ENCOUNTER — Encounter: Payer: Self-pay | Admitting: Physical Therapy

## 2018-02-02 ENCOUNTER — Ambulatory Visit: Payer: Managed Care, Other (non HMO) | Admitting: Physical Therapy

## 2018-02-02 DIAGNOSIS — M6281 Muscle weakness (generalized): Secondary | ICD-10-CM

## 2018-02-02 DIAGNOSIS — R293 Abnormal posture: Secondary | ICD-10-CM

## 2018-02-02 DIAGNOSIS — R252 Cramp and spasm: Secondary | ICD-10-CM

## 2018-02-02 DIAGNOSIS — M542 Cervicalgia: Secondary | ICD-10-CM

## 2018-02-02 DIAGNOSIS — R278 Other lack of coordination: Secondary | ICD-10-CM

## 2018-02-02 DIAGNOSIS — R262 Difficulty in walking, not elsewhere classified: Secondary | ICD-10-CM
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
## 2018-02-05 ENCOUNTER — Ambulatory Visit: Payer: Managed Care, Other (non HMO)

## 2018-02-05 ENCOUNTER — Ambulatory Visit: Payer: Managed Care, Other (non HMO) | Admitting: Occupational Therapy

## 2018-02-08 ENCOUNTER — Encounter: Payer: Self-pay | Admitting: Occupational Therapy

## 2018-02-08 ENCOUNTER — Ambulatory Visit: Payer: Managed Care, Other (non HMO)

## 2018-02-08 ENCOUNTER — Ambulatory Visit: Payer: Managed Care, Other (non HMO) | Admitting: Occupational Therapy

## 2018-02-08 VITALS — BP 127/113

## 2018-02-08 VITALS — BP 120/77 | HR 64

## 2018-02-08 DIAGNOSIS — R278 Other lack of coordination: Secondary | ICD-10-CM

## 2018-02-08 DIAGNOSIS — M6281 Muscle weakness (generalized): Secondary | ICD-10-CM

## 2018-02-08 DIAGNOSIS — R262 Difficulty in walking, not elsewhere classified: Secondary | ICD-10-CM
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
## 2018-02-10 ENCOUNTER — Encounter: Payer: Self-pay | Admitting: Internal Medicine

## 2018-02-10 ENCOUNTER — Ambulatory Visit: Payer: Managed Care, Other (non HMO) | Admitting: Internal Medicine

## 2018-02-10 VITALS — BP 102/68 | HR 77 | Temp 98.8°F | Ht 68.5 in | Wt 272.4 lb

## 2018-02-10 DIAGNOSIS — R112 Nausea with vomiting, unspecified: Secondary | ICD-10-CM | POA: Diagnosis not present

## 2018-02-10 DIAGNOSIS — Z85528 Personal history of other malignant neoplasm of kidney: Secondary | ICD-10-CM

## 2018-02-10 DIAGNOSIS — F419 Anxiety disorder, unspecified: Secondary | ICD-10-CM

## 2018-02-10 DIAGNOSIS — F329 Major depressive disorder, single episode, unspecified: Secondary | ICD-10-CM

## 2018-02-10 DIAGNOSIS — K760 Fatty (change of) liver, not elsewhere classified: Secondary | ICD-10-CM

## 2018-02-10 DIAGNOSIS — M542 Cervicalgia: Secondary | ICD-10-CM

## 2018-02-10 DIAGNOSIS — F32A Depression, unspecified: Secondary | ICD-10-CM

## 2018-02-10 DIAGNOSIS — N183 Chronic kidney disease, stage 3 unspecified: Secondary | ICD-10-CM

## 2018-02-10 DIAGNOSIS — G8929 Other chronic pain: Secondary | ICD-10-CM | POA: Insufficient documentation

## 2018-02-10 DIAGNOSIS — I10 Essential (primary) hypertension: Secondary | ICD-10-CM

## 2018-02-10 MED ORDER — AMLODIPINE BESYLATE 10 MG PO TABS: 5.0000 mg | ORAL_TABLET | Freq: Every day | ORAL | 3 refills | Status: DC

## 2018-02-10 MED ORDER — SERTRALINE HCL 100 MG PO TABS: 100.0000 mg | ORAL_TABLET | Freq: Every day | ORAL | 3 refills | Status: DC
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
## 2018-02-10 NOTE — Progress Notes (Signed)
Pre visit review using our clinic review tool, if applicable. No additional management support is needed unless otherwise documented below in the visit note. 

## 2018-02-10 NOTE — Patient Instructions (Addendum)
Cut norvasc 10 mg in 1/2 pill daily =5 mg daily  Call Dietrich Pates for therapy in [place] [phone]  Call to request MRI from 06/2017 results [phone]) [phone] Please get US renal scheduled  Consider checking blood work to see if you need hep A and B vaccine with fatty liver  Sent referral to [facility]    Cholesterol Cholesterol is a white, waxy, fat-like substance that is needed by the human body in small amounts. The liver makes all the cholesterol we need. Cholesterol is carried from the liver by the blood through the blood vessels. Deposits of cholesterol (plaques) may build up on blood vessel (artery) walls. Plaques make the arteries narrower and stiffer. Cholesterol plaques increase the risk for heart attack and stroke. You cannot feel your cholesterol level even if it is very high. The only way to know that it is high is to have a blood test. Once you know your cholesterol levels, you should keep a record of the test results. Work with your health care provider to keep your levels in the desired range. What do the results mean?  Total cholesterol is a rough measure of all the cholesterol in your blood.  LDL (low-density lipoprotein) is the "bad" cholesterol. This is the type that causes plaque to build up on the artery walls. You want this level to be low.  HDL (high-density lipoprotein) is the "good" cholesterol because it cleans the arteries and carries the LDL away. You want this level to be high.  Triglycerides are fat that the body can either burn for energy or store. High levels are closely linked to heart disease. What are the desired levels of cholesterol?  Total cholesterol below 200.  LDL below 100 for people who are at risk, below 70 for people at very high risk.  HDL above 40 is good. A level of 60 or higher is considered to be protective against heart disease.  Triglycerides below 150. How can I lower my cholesterol? Diet Follow your diet program as  told by your health care provider.  Choose fish or white meat chicken and [place], roasted or baked. Limit fatty cuts of red meat, fried foods, and processed meats, such as sausage and lunch meats.  Eat lots of fresh fruits and vegetables.  Choose whole grains, beans, pasta, potatoes, and cereals.  Choose olive oil, corn oil, or canola oil, and use only small amounts.  Avoid butter, mayonnaise, shortening, or palm kernel oils.  Avoid foods with trans fats.  Drink skim or nonfat milk and eat low-fat or nonfat yogurt and cheeses. Avoid whole milk, cream, ice cream, egg yolks, and full-fat cheeses.  Healthier desserts include angel food cake, ginger snaps, animal crackers, hard candy, popsicles, and low-fat or nonfat frozen yogurt. Avoid pastries, cakes, pies, and cookies.  Exercise  Follow your exercise program as told by your health care provider. A regular program: ? Helps to decrease LDL and raise HDL. ? Helps with weight control.  Do things that increase your activity level, such as gardening, walking, and taking the stairs.  Ask your health care provider about ways that you can be more active in your daily life.  Medicine  Take over-the-counter and prescription medicines only as told by your health care provider. ? Medicine may be prescribed by your health care provider to help lower cholesterol and decrease the risk for heart disease. This is usually done if diet and exercise have failed to bring down cholesterol levels. ? If  you have several risk factors, you may need medicine even if your levels are normal.  This information is not intended to replace advice given to you by your health care provider. Make sure you discuss any questions you have with your health care provider. Document Released: 04/01/2001 Document Revised: 02/02/2016 Document Reviewed: 01/05/2016 Elsevier Interactive Patient Education  2018 [place].     Fatty Liver Fatty liver, also called hepatic  steatosis or steatohepatitis, is a condition in which too much fat has built up in your liver cells. The liver removes harmful substances from your bloodstream. It produces fluids your body needs. It also helps your body use and store energy from the food you eat. In many cases, fatty liver does not cause symptoms or problems. It is often diagnosed when tests are being done for other reasons. However, over time, fatty liver can cause inflammation that may lead to more serious liver problems, such as scarring of the liver (cirrhosis). What are the causes? Causes of fatty liver may include:  Drinking too much alcohol.  Poor nutrition.  Obesity.  Cushing syndrome.  Diabetes.  Hyperlipidemia.  Pregnancy.  Certain drugs.  Poisons.  Some viral infections.  What increases the risk? You may be more likely to develop fatty liver if you:  Abuse alcohol.  Are pregnant.  Are overweight.  Have diabetes.  Have hepatitis.  Have a high triglyceride level.  What are the signs or symptoms? Fatty liver often does not cause any symptoms. In cases where symptoms develop, they can include:  Fatigue.  Weakness.  Weight loss.  Confusion.  Abdominal pain.  Yellowing of your skin and the white parts of your eyes (jaundice).  Nausea and vomiting.  How is this diagnosed? Fatty liver may be diagnosed by:  Physical exam and medical history.  Blood tests.  Imaging tests, such as an ultrasound, CT scan, or MRI.  Liver biopsy. A small sample of liver tissue is removed using a needle. The sample is then looked at under a microscope.  How is this treated? Fatty liver is often caused by other health conditions. Treatment for fatty liver may involve medicines and lifestyle changes to manage conditions such as:  Alcoholism.  High cholesterol.  Diabetes.  Being overweight or obese.  Follow these instructions at home:  Eat a healthy diet as directed by your health care  provider.  Exercise regularly. This can help you lose weight and control your cholesterol and diabetes. Talk to your health care provider about an exercise plan and which activities are best for you.  Do not drink alcohol.  Take medicines only as directed by your health care provider. Contact a health care provider if: You have difficulty controlling your:  Blood sugar.  Cholesterol.  Alcohol consumption.  Get help right away if:  You have abdominal pain.  You have jaundice.  You have nausea and vomiting. This information is not intended to replace advice given to you by your health care provider. Make sure you discuss any questions you have with your health care provider. Document Released: 08/22/2005 Document Revised: 12/13/2015 Document Reviewed: 11/16/2013 Elsevier Interactive Patient Education  2018 [place].  Nonalcoholic Fatty Liver Disease Diet Nonalcoholic fatty liver disease is a condition that causes fat to accumulate in and around the liver. The disease makes it harder for the liver to work the way that it should. Following a healthy diet can help to keep nonalcoholic fatty liver disease under control. It can also help to prevent or  improve conditions that are associated with the disease, such as heart disease, diabetes, high blood pressure, and abnormal cholesterol levels. Along with regular exercise, this diet:  Promotes weight loss.  Helps to control blood sugar levels.  Helps to improve the way that the body uses insulin.  What do I need to know about this diet?  Use the glycemic index (GI) to plan your meals. The index tells you how quickly a food will raise your blood sugar. Choose low-GI foods. These foods take a longer time to raise blood sugar.  Keep track of how many calories you take in. Eating the right amount of calories will help you to achieve a healthy weight.  You may want to follow a Mediterranean diet. This diet includes a lot of  vegetables, lean meats or fish, whole grains, fruits, and healthy oils and fats. What foods can I eat? Grains Whole grains, such as whole-wheat or whole-grain breads, crackers, tortillas, cereals, and pasta. Stone-ground whole wheat. Pumpernickel bread. Unsweetened oatmeal. Bulgur. Barley. Quinoa. Brown or wild rice. Corn or whole-wheat flour tortillas. Vegetables Lettuce. Spinach. Peas. Beets. Cauliflower. Cabbage. Broccoli. Carrots. Tomatoes. Squash. Eggplant. Herbs. Peppers. Onions. Cucumbers. Brussels sprouts. Yams and sweet potatoes. Beans. Lentils. Fruits Bananas. Apples. Oranges. Grapes. Papaya. Mango. Pomegranate. Kiwi. Grapefruit. Cherries. Meats and Other Protein Sources Seafood and shellfish. Lean meats. Poultry. Tofu. Dairy Low-fat or fat-free dairy products, such as yogurt, cottage cheese, and cheese. Beverages Water. Sugar-free drinks. Tea. Coffee. Low-fat or skim milk. Milk alternatives, such as soy or almond milk. Real fruit juice. Condiments Mustard. Relish. Low-fat, low-sugar ketchup and barbecue sauce. Low-fat or fat-free mayonnaise. Sweets and Desserts Sugar-free sweets. Fats and Oils Avocado. Canola or olive oil. Nuts and nut butters. Seeds. The items listed above may not be a complete list of recommended foods or beverages. Contact your dietitian for more options. What foods are not recommended? Palm oil and coconut oil. Processed foods. Fried foods. Sweetened drinks, such as sweet tea, milkshakes, snow cones, iced sweet drinks, and sodas. Alcohol. Sweets. Foods that contain a lot of salt or sodium. The items listed above may not be a complete list of foods and beverages to avoid. Contact your dietitian for more information. This information is not intended to replace advice given to you by your health care provider. Make sure you discuss any questions you have with your health care provider. Document Released: 11/21/2014 Document Revised: 12/13/2015 Document Reviewed:  08/01/2014 Elsevier Interactive Patient Education  [employer].

## 2018-02-11 ENCOUNTER — Ambulatory Visit: Payer: Managed Care, Other (non HMO) | Admitting: Occupational Therapy

## 2018-02-11 ENCOUNTER — Encounter: Payer: Self-pay | Admitting: Occupational Therapy

## 2018-02-11 ENCOUNTER — Ambulatory Visit: Payer: Managed Care, Other (non HMO)

## 2018-02-11 VITALS — BP 116/66 | HR 70

## 2018-02-11 DIAGNOSIS — M6281 Muscle weakness (generalized): Secondary | ICD-10-CM | POA: Diagnosis not present

## 2018-02-11 DIAGNOSIS — R262 Difficulty in walking, not elsewhere classified: Secondary | ICD-10-CM

## 2018-02-11 DIAGNOSIS — R278 Other lack of coordination: Secondary | ICD-10-CM
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
## 2018-02-11 NOTE — Patient Instructions (Signed)
Access Code: [email or web address]  URL: [email or web address]  Date: 02/11/2018  Prepared by: Janna Arch   Exercises  Sidelying ITB Stretch off Table - 2 reps - 30 hold - 1x daily - 7x weekly
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
## 2018-02-15 ENCOUNTER — Ambulatory Visit: Payer: Managed Care, Other (non HMO) | Admitting: Occupational Therapy

## 2018-02-15 ENCOUNTER — Ambulatory Visit: Payer: Managed Care, Other (non HMO) | Admitting: Physical Therapy

## 2018-02-15 ENCOUNTER — Encounter: Payer: Self-pay | Admitting: Physical Therapy

## 2018-02-15 DIAGNOSIS — R278 Other lack of coordination: Secondary | ICD-10-CM

## 2018-02-15 DIAGNOSIS — M6281 Muscle weakness (generalized): Secondary | ICD-10-CM

## 2018-02-15 DIAGNOSIS — R262 Difficulty in walking, not elsewhere classified: Secondary | ICD-10-CM
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
## 2018-02-17 ENCOUNTER — Encounter: Payer: Self-pay | Admitting: Occupational Therapy

## 2018-02-17 ENCOUNTER — Ambulatory Visit: Payer: Managed Care, Other (non HMO) | Admitting: Occupational Therapy

## 2018-02-17 ENCOUNTER — Ambulatory Visit: Payer: Managed Care, Other (non HMO)

## 2018-02-17 DIAGNOSIS — M6281 Muscle weakness (generalized): Secondary | ICD-10-CM

## 2018-02-17 DIAGNOSIS — R278 Other lack of coordination: Secondary | ICD-10-CM

## 2018-02-17 DIAGNOSIS — R262 Difficulty in walking, not elsewhere classified: Secondary | ICD-10-CM
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
## 2018-02-22 ENCOUNTER — Ambulatory Visit: Payer: Managed Care, Other (non HMO) | Attending: Internal Medicine | Admitting: Occupational Therapy

## 2018-02-22 ENCOUNTER — Encounter: Payer: Self-pay | Admitting: Occupational Therapy

## 2018-02-22 ENCOUNTER — Ambulatory Visit: Payer: Managed Care, Other (non HMO)

## 2018-02-22 DIAGNOSIS — R278 Other lack of coordination: Secondary | ICD-10-CM

## 2018-02-22 DIAGNOSIS — R262 Difficulty in walking, not elsewhere classified: Secondary | ICD-10-CM | POA: Insufficient documentation

## 2018-02-22 DIAGNOSIS — M6281 Muscle weakness (generalized): Secondary | ICD-10-CM

## 2018-02-22 DIAGNOSIS — R293 Abnormal posture: Secondary | ICD-10-CM | POA: Insufficient documentation
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
## 2018-02-24 ENCOUNTER — Ambulatory Visit: Payer: Managed Care, Other (non HMO) | Admitting: Occupational Therapy

## 2018-02-24 ENCOUNTER — Ambulatory Visit: Payer: Managed Care, Other (non HMO) | Admitting: Physical Therapy

## 2018-03-01 ENCOUNTER — Ambulatory Visit: Payer: Managed Care, Other (non HMO)

## 2018-03-01 ENCOUNTER — Ambulatory Visit: Payer: Managed Care, Other (non HMO) | Admitting: Occupational Therapy

## 2018-03-01 ENCOUNTER — Telehealth: Payer: Self-pay | Admitting: Internal Medicine

## 2018-03-01 ENCOUNTER — Encounter: Payer: Self-pay | Admitting: Occupational Therapy

## 2018-03-01 DIAGNOSIS — M6281 Muscle weakness (generalized): Secondary | ICD-10-CM | POA: Diagnosis not present

## 2018-03-01 DIAGNOSIS — R293 Abnormal posture: Secondary | ICD-10-CM

## 2018-03-01 DIAGNOSIS — R278 Other lack of coordination: Secondary | ICD-10-CM

## 2018-03-01 DIAGNOSIS — R262 Difficulty in walking, not elsewhere classified: Secondary | ICD-10-CM
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
## 2018-03-01 NOTE — Telephone Encounter (Signed)
Copied from [place] [id number]. Topic: Quick Communication - See Telephone Encounter >> Mar 01, 2018 12:17 PM Gardiner Ramus wrote: CRM for notification. See Telephone encounter for: 03/01/18. Pt called and stated that he had some questions about medications and would like clarification. Please advise. Cb#[phone]
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
## 2018-03-01 NOTE — Telephone Encounter (Signed)
Medication questions

## 2018-03-02 NOTE — Telephone Encounter (Signed)
Spoken to patient and answered which dosage is correct due to recent changes. He wanted update. Just incase.

## 2018-03-03 ENCOUNTER — Ambulatory Visit: Payer: Managed Care, Other (non HMO)

## 2018-03-03 ENCOUNTER — Encounter: Payer: Managed Care, Other (non HMO) | Admitting: Occupational Therapy

## 2018-03-08 ENCOUNTER — Ambulatory Visit: Payer: Managed Care, Other (non HMO)

## 2018-03-08 ENCOUNTER — Encounter: Payer: Self-pay | Admitting: Occupational Therapy

## 2018-03-08 ENCOUNTER — Ambulatory Visit: Payer: Managed Care, Other (non HMO) | Admitting: Occupational Therapy

## 2018-03-08 DIAGNOSIS — M6281 Muscle weakness (generalized): Secondary | ICD-10-CM

## 2018-03-08 DIAGNOSIS — R278 Other lack of coordination: Secondary | ICD-10-CM

## 2018-03-08 DIAGNOSIS — R293 Abnormal posture: Secondary | ICD-10-CM

## 2018-03-08 DIAGNOSIS — R262 Difficulty in walking, not elsewhere classified: Secondary | ICD-10-CM
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
## 2018-03-10 ENCOUNTER — Encounter: Payer: Managed Care, Other (non HMO) | Admitting: Occupational Therapy

## 2018-03-10 ENCOUNTER — Ambulatory Visit: Payer: Managed Care, Other (non HMO)

## 2018-03-15 ENCOUNTER — Ambulatory Visit: Payer: Managed Care, Other (non HMO) | Admitting: Occupational Therapy

## 2018-03-15 ENCOUNTER — Ambulatory Visit: Payer: Managed Care, Other (non HMO)

## 2018-03-15 VITALS — BP 126/84 | HR 64

## 2018-03-15 DIAGNOSIS — M6281 Muscle weakness (generalized): Secondary | ICD-10-CM | POA: Diagnosis not present

## 2018-03-15 DIAGNOSIS — R278 Other lack of coordination: Secondary | ICD-10-CM

## 2018-03-15 DIAGNOSIS — R262 Difficulty in walking, not elsewhere classified: Secondary | ICD-10-CM
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
## 2018-03-16 ENCOUNTER — Other Ambulatory Visit: Payer: Self-pay | Admitting: Internal Medicine

## 2018-03-16 ENCOUNTER — Encounter: Payer: Self-pay | Admitting: Occupational Therapy

## 2018-03-16 DIAGNOSIS — E876 Hypokalemia: Secondary | ICD-10-CM

## 2018-03-16 DIAGNOSIS — R112 Nausea with vomiting, unspecified: Secondary | ICD-10-CM

## 2018-03-16 DIAGNOSIS — I10 Essential (primary) hypertension: Secondary | ICD-10-CM

## 2018-03-16 MED ORDER — CARVEDILOL 12.5 MG PO TABS: 12.5000 mg | ORAL_TABLET | Freq: Two times a day (BID) | ORAL | 3 refills | Status: DC

## 2018-03-16 MED ORDER — POTASSIUM CHLORIDE CRYS ER 20 MEQ PO TBCR: 20.0000 meq | EXTENDED_RELEASE_TABLET | Freq: Every day | ORAL | 3 refills | Status: DC

## 2018-03-16 MED ORDER — RANITIDINE HCL 150 MG PO TABS: 150.0000 mg | ORAL_TABLET | Freq: Two times a day (BID) | ORAL | 3 refills | Status: DC
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
## 2018-03-17 ENCOUNTER — Encounter: Payer: Managed Care, Other (non HMO) | Admitting: Occupational Therapy

## 2018-03-17 ENCOUNTER — Ambulatory Visit: Payer: Managed Care, Other (non HMO)

## 2018-04-06 ENCOUNTER — Other Ambulatory Visit: Payer: Self-pay | Admitting: Internal Medicine

## 2018-04-06 DIAGNOSIS — F329 Major depressive disorder, single episode, unspecified: Secondary | ICD-10-CM

## 2018-04-06 DIAGNOSIS — F32A Depression, unspecified: Secondary | ICD-10-CM

## 2018-04-06 MED ORDER — BUPROPION HCL ER (XL) 300 MG PO TB24: 300.0000 mg | ORAL_TABLET | Freq: Every day | ORAL | 11 refills | Status: DC

## 2018-05-13 ENCOUNTER — Encounter: Payer: Self-pay | Admitting: Internal Medicine

## 2018-05-13 ENCOUNTER — Ambulatory Visit: Payer: Managed Care, Other (non HMO) | Admitting: Internal Medicine

## 2018-05-13 VITALS — BP 124/84 | HR 76 | Temp 98.2°F | Resp 14 | Ht 68.5 in | Wt 284.1 lb

## 2018-05-13 DIAGNOSIS — G894 Chronic pain syndrome: Secondary | ICD-10-CM

## 2018-05-13 DIAGNOSIS — N183 Chronic kidney disease, stage 3 unspecified: Secondary | ICD-10-CM

## 2018-05-13 DIAGNOSIS — Z1159 Encounter for screening for other viral diseases: Secondary | ICD-10-CM

## 2018-05-13 DIAGNOSIS — I639 Cerebral infarction, unspecified: Secondary | ICD-10-CM

## 2018-05-13 DIAGNOSIS — I10 Essential (primary) hypertension: Secondary | ICD-10-CM

## 2018-05-13 DIAGNOSIS — F32A Depression, unspecified: Secondary | ICD-10-CM

## 2018-05-13 DIAGNOSIS — E559 Vitamin D deficiency, unspecified: Secondary | ICD-10-CM

## 2018-05-13 DIAGNOSIS — Z85528 Personal history of other malignant neoplasm of kidney: Secondary | ICD-10-CM | POA: Diagnosis not present

## 2018-05-13 DIAGNOSIS — E291 Testicular hypofunction: Secondary | ICD-10-CM

## 2018-05-13 DIAGNOSIS — F329 Major depressive disorder, single episode, unspecified: Secondary | ICD-10-CM

## 2018-05-13 DIAGNOSIS — Z0184 Encounter for antibody response examination: Secondary | ICD-10-CM

## 2018-05-13 MED ORDER — AMLODIPINE BESYLATE 5 MG PO TABS: 5.0000 mg | ORAL_TABLET | Freq: Every day | ORAL | 3 refills | Status: DC

## 2018-05-13 NOTE — Patient Instructions (Addendum)
[place] facility on [address] get Xrays please  Call when ready for ordering MRI right hip and low back  F/u 07/2018 fasting labs  Dr. Deetta Perla Neurosurgeon or Dr. Electa Sniff   Lumbosacral Radiculopathy Lumbosacral radiculopathy is a condition that involves the spinal nerves and nerve roots in the low back and bottom of the spine. The condition develops when these nerves and nerve roots move out of place or become inflamed and cause symptoms. What are the causes? This condition may be caused by:  Pressure from a disk that bulges out of place (herniated disk). A disk is a plate of cartilage that separates bones in the spine.  Disk degeneration.  A narrowing of the bones of the lower back (spinal stenosis).  A tumor.  An infection.  An injury that places sudden pressure on the disks that cushion the bones of your lower spine.  What increases the risk? This condition is more likely to develop in:  Males aged 30-50 years.  Females aged 6-60 years.  People who lift improperly.  People who are overweight or live a sedentary lifestyle.  People who smoke.  People who perform repetitive activities that strain the spine.  What are the signs or symptoms? Symptoms of this condition include:  Pain that goes down from the back into the legs (sciatica). This is the most common symptom. The pain may be worse with sitting, coughing, or sneezing.  Pain and numbness in the arms and legs.  Muscle weakness.  Tingling.  Loss of bladder control or bowel control.  How is this diagnosed? This condition is diagnosed with a physical exam and medical history. If the pain is lasting, you may have tests, such as:  MRI scan.  X-ray.  CT scan.  Myelogram.  Nerve conduction study.  How is this treated? This condition is often treated with:  Hot packs and ice applied to affected areas.  Stretches to improve flexibility.  Exercises to strengthen back muscles.  Physical  therapy.  Pain medicine.  A steroid injection in the spine.  In some cases, no treatment is needed. If the condition is long-lasting (chronic), or if symptoms are severe, treatment may involve surgery or lifestyle changes, such as following a weight loss plan. Follow these instructions at home: Medicines  Take medicines only as directed by your health care provider.  Do not drive or operate heavy machinery while taking pain medicine. Injury care  Apply a heat pack to the injured area as directed by your health care provider.  Apply ice to the affected area: ? Put ice in a plastic bag. ? Place a towel between your skin and the bag. ? Leave the ice on for 20-30 minutes, every 2 hours while you are awake or as needed. Or, leave the ice on for as long as directed by your health care provider. Other Instructions  If you were shown how to do any exercises or stretches, do them as directed by your health care provider.  If your health care provider prescribed a diet or exercise program, follow it as directed.  Keep all follow-up visits as directed by your health care provider. This is important. Contact a health care provider if:  Your pain does not improve over time even when taking pain medicines. Get help right away if:  Your develop severe pain.  Your pain suddenly gets worse.  You develop increasing weakness in your legs.  You lose the ability to control your bladder or bowel.  You have  difficulty walking or balancing.  You have a fever. This information is not intended to replace advice given to you by your health care provider. Make sure you discuss any questions you have with your health care provider. Document Released: 07/07/2005 Document Revised: 12/13/2015 Document Reviewed: 07/03/2014 Elsevier Interactive Patient Education  [employer].
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
## 2018-06-02 ENCOUNTER — Telehealth: Payer: Self-pay

## 2018-06-02 NOTE — Telephone Encounter (Signed)
Copied from [place] [id number]. Topic: General - Other >> Jun 02, 2018  4:26 PM Janace Aris A wrote: Reason for CRM: Pt called in wanting to follow up on a discussion he had with Dr. Karlyn Agee regarding receiving X-rays at the office. He would like to schedule for that.   Please advise

## 2018-06-03 NOTE — Telephone Encounter (Signed)
No orders in chart. Please advise.

## 2018-06-04 ENCOUNTER — Other Ambulatory Visit: Payer: Self-pay | Admitting: Internal Medicine

## 2018-06-04 DIAGNOSIS — M545 Low back pain, unspecified: Secondary | ICD-10-CM

## 2018-06-04 DIAGNOSIS — G8929 Other chronic pain: Secondary | ICD-10-CM

## 2018-06-04 DIAGNOSIS — M25551 Pain in right hip: Secondary | ICD-10-CM

## 2018-06-04 NOTE — Telephone Encounter (Signed)
Ordered Xray right hip and low back inform pt he can come here   Jake Brown

## 2018-06-08 NOTE — Telephone Encounter (Signed)
Patient has been informed. Also he saw his px doctor today and they are doing MRI of lower back.

## 2018-06-10 ENCOUNTER — Ambulatory Visit (INDEPENDENT_AMBULATORY_CARE_PROVIDER_SITE_OTHER): Payer: Managed Care, Other (non HMO)

## 2018-06-10 DIAGNOSIS — M545 Low back pain, unspecified: Secondary | ICD-10-CM

## 2018-06-10 DIAGNOSIS — M25551 Pain in right hip: Secondary | ICD-10-CM

## 2018-06-10 DIAGNOSIS — G8929 Other chronic pain: Secondary | ICD-10-CM | POA: Diagnosis not present

## 2018-06-11 ENCOUNTER — Telehealth: Payer: Self-pay

## 2018-06-11 NOTE — Telephone Encounter (Signed)
Patient would like to have last Xrays on a disc. He stated he will come pick up once notified it is ready. Please advise.

## 2018-06-11 NOTE — Telephone Encounter (Signed)
Left message for pt to inform pt that copy of CD is ready for pickup

## 2018-08-03 ENCOUNTER — Ambulatory Visit: Payer: Self-pay | Admitting: *Deleted

## 2018-08-03 NOTE — Telephone Encounter (Signed)
Pt calling with complaints of left hand swelling after falling on his hand on yesterday around 9:30 -10 am. Pt states that his left hand is twice the size of right hand but states he is still able to move his fingers. Pt states that he had a stroke in March of last year and does have some paralysis in left leg and arm. Pt states that his hand is not deformed and no bone is protruding out. Pt states it is hard to tell if he is having pain in the hand because he takes pain medication. Pt also states that he has an abrasion to the top of left hand that is over an inch long but states that it is not bleeding currently. No appt availability with PCP on tomorrow. Pt scheduled for appt with Margaret,NP on 1/15. Pt advised that if symptoms became worse before scheduled appt to seek treatment in the ED/Urgent Care. Pt verbalized understanding.   Reason for Disposition . Large swelling or bruise (> 2 inches or 5 cm)  Answer Assessment - Initial Assessment Questions 1. MECHANISM: "How did the injury happen?"     Pt states while walking his dog on yesterday, the dog pulled away from him which caused him to trip over his feet and fall on his hand 2. ONSET: "When did the injury happen?" (Minutes or hours ago)      Happened around 9;30- 10 am on yesterday morning 3. APPEARANCE of INJURY: "What does the injury look like?"      Hand is swollen twice the size of right  4. SEVERITY: "Can you use the hand normally?" "Can you bend your fingers into a ball and then fully open them?"     Pt states he is still able to use his hands and move his fingers 5. SIZE: For cuts, bruises, or swelling, ask: "How large is it?" (e.g., inches or centimeters;  entire hand or wrist)      Pt states he has an abrasion on the left hand hat is over an inch long 6. PAIN: "Is there pain?" If so, ask: "How bad is the pain?"  (Scale 1-10; or mild, moderate, severe)     Pt states it is hard to tell if he is having pain because he is on pain  medication 7. OTHER SYMPTOMS: "Do you have any other symptoms?"      No other symptoms voiced at this time  Protocols used: HAND AND WRIST INJURY-A-AH

## 2018-08-04 ENCOUNTER — Ambulatory Visit (INDEPENDENT_AMBULATORY_CARE_PROVIDER_SITE_OTHER): Payer: PRIVATE HEALTH INSURANCE

## 2018-08-04 ENCOUNTER — Ambulatory Visit (INDEPENDENT_AMBULATORY_CARE_PROVIDER_SITE_OTHER): Payer: PRIVATE HEALTH INSURANCE | Admitting: Family

## 2018-08-04 ENCOUNTER — Encounter: Payer: Self-pay | Admitting: Family

## 2018-08-04 VITALS — BP 116/82 | HR 73 | Temp 98.6°F | Wt 300.8 lb

## 2018-08-04 DIAGNOSIS — M25532 Pain in left wrist: Secondary | ICD-10-CM | POA: Diagnosis not present

## 2018-08-04 DIAGNOSIS — Z23 Encounter for immunization: Secondary | ICD-10-CM

## 2018-08-04 NOTE — Patient Instructions (Signed)
I am sorry for your fall this week.  We will look at the x-rays to ensure no fracture.  Otherwise continue ice therapy as we discussed.  Nice to meet you.
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
## 2018-08-13 ENCOUNTER — Other Ambulatory Visit (INDEPENDENT_AMBULATORY_CARE_PROVIDER_SITE_OTHER): Payer: PRIVATE HEALTH INSURANCE

## 2018-08-13 DIAGNOSIS — Z1159 Encounter for screening for other viral diseases: Secondary | ICD-10-CM

## 2018-08-13 DIAGNOSIS — E291 Testicular hypofunction: Secondary | ICD-10-CM

## 2018-08-13 DIAGNOSIS — Z85528 Personal history of other malignant neoplasm of kidney: Secondary | ICD-10-CM

## 2018-08-13 DIAGNOSIS — I10 Essential (primary) hypertension: Secondary | ICD-10-CM | POA: Diagnosis not present

## 2018-08-13 DIAGNOSIS — Z0184 Encounter for antibody response examination: Secondary | ICD-10-CM

## 2018-08-13 DIAGNOSIS — E559 Vitamin D deficiency, unspecified: Secondary | ICD-10-CM

## 2018-08-13 NOTE — Addendum Note (Signed)
Addended by: Arby Barrette on: 08/13/2018 08:01 AM   Modules accepted: Orders

## 2018-08-14 LAB — COMPREHENSIVE METABOLIC PANEL
AG Ratio: 1.8 (calc) (ref 1.0–2.5)
ALBUMIN MSPROF: 4.2 g/dL (ref 3.6–5.1)
ALT: 16 U/L (ref 9–46)
AST: 16 U/L (ref 10–35)
Alkaline phosphatase (APISO): 95 U/L (ref 40–115)
BUN / CREAT RATIO: 17 (calc) (ref 6–22)
BUN: 25 mg/dL (ref 7–25)
CHLORIDE: 102 mmol/L (ref 98–110)
CO2: 29 mmol/L (ref 20–32)
CREATININE: 1.45 mg/dL — AB (ref 0.70–1.33)
Calcium: 9.3 mg/dL (ref 8.6–10.3)
Globulin: 2.4 g/dL (calc) (ref 1.9–3.7)
Glucose, Bld: 96 mg/dL (ref 65–99)
POTASSIUM: 4.5 mmol/L (ref 3.5–5.3)
Sodium: 139 mmol/L (ref 135–146)
TOTAL PROTEIN: 6.6 g/dL (ref 6.1–8.1)
Total Bilirubin: 0.4 mg/dL (ref 0.2–1.2)

## 2018-08-14 LAB — URINALYSIS, ROUTINE W REFLEX MICROSCOPIC
Bilirubin, UA: NEGATIVE
GLUCOSE, UA: NEGATIVE
Ketones, UA: NEGATIVE
Leukocytes, UA: NEGATIVE
Nitrite, UA: NEGATIVE
Protein, UA: NEGATIVE
RBC, UA: NEGATIVE
Specific Gravity, UA: 1.02 (ref 1.005–1.030)
Urobilinogen, Ur: 0.2 mg/dL (ref 0.2–1.0)
pH, UA: 5.5 (ref 5.0–7.5)

## 2018-08-15 ENCOUNTER — Other Ambulatory Visit: Payer: Self-pay | Admitting: Internal Medicine

## 2018-08-15 DIAGNOSIS — E559 Vitamin D deficiency, unspecified: Secondary | ICD-10-CM | POA: Insufficient documentation

## 2018-08-15 MED ORDER — CHOLECALCIFEROL 1.25 MG (50000 UT) PO CAPS: 50000.0000 [IU] | ORAL_CAPSULE | ORAL | 1 refills | Status: DC

## 2018-08-16 LAB — TEST AUTHORIZATION

## 2018-08-16 LAB — MEASLES/MUMPS/RUBELLA IMMUNITY
Mumps IgG: >300 AU/mL
Rubella: <0.9 index — ABNORMAL LOW
Rubeola IgG: <13.5 AU/mL — ABNORMAL LOW

## 2018-08-16 LAB — TESTOSTERONE TOTAL,FREE,BIO, MALES
Albumin: 4.2 g/dL (ref 3.6–5.1)
Sex Hormone Binding: 25 nmol/L (ref 10–50)
Testosterone, Bioavailable: 112.7 ng/dL (ref >=110.0)
Testosterone, Free: 58.5 pg/mL (ref 46.0–224.0)
Testosterone: 356 ng/dL (ref 250–827)

## 2018-08-16 LAB — CBC WITH DIFFERENTIAL/PLATELET
Absolute Monocytes: 574 cells/uL (ref 200–950)
BASOS PCT: 0.9 %
Basophils Absolute: 78 cells/uL (ref 0–200)
Eosinophils Absolute: 392 cells/uL (ref 15–500)
Eosinophils Relative: 4.5 %
HEMATOCRIT: 39.4 % (ref 38.5–50.0)
HEMOGLOBIN: 13.5 g/dL (ref 13.2–17.1)
Lymphs Abs: 2158 cells/uL (ref 850–3900)
MCH: 30.1 pg (ref 27.0–33.0)
MCHC: 34.3 g/dL (ref 32.0–36.0)
MCV: 87.8 fL (ref 80.0–100.0)
MPV: 11.6 fL (ref 7.5–12.5)
Monocytes Relative: 6.6 %
Neutro Abs: 5498 cells/uL (ref 1500–7800)
Neutrophils Relative %: 63.2 %
Platelets: 235 10*3/uL (ref 140–400)
RBC: 4.49 10*6/uL (ref 4.20–5.80)
RDW: 13.6 % (ref 11.0–15.0)
Total Lymphocyte: 24.8 %
WBC: 8.7 10*3/uL (ref 3.8–10.8)

## 2018-08-16 LAB — LIPID PANEL
Cholesterol: 125 mg/dL (ref <200)
HDL: 53 mg/dL (ref >40)
LDL Cholesterol (Calc): 54 mg/dL (calc)
NON-HDL CHOLESTEROL (CALC): 72 mg/dL (ref <130)
Total CHOL/HDL Ratio: 2.4 (calc) (ref <5.0)
Triglycerides: 92 mg/dL (ref <150)

## 2018-08-16 LAB — COMPLETE METABOLIC PANEL WITH GFR
AG Ratio: 1.8 (calc) (ref 1.0–2.5)
ALT: 16 U/L (ref 9–46)
AST: 16 U/L (ref 10–35)
Albumin: 4.2 g/dL (ref 3.6–5.1)
Alkaline phosphatase (APISO): 95 U/L (ref 40–115)
BUN/Creatinine Ratio: 17 (calc) (ref 6–22)
BUN: 25 mg/dL (ref 7–25)
CHLORIDE: 102 mmol/L (ref 98–110)
CO2: 29 mmol/L (ref 20–32)
Calcium: 9.3 mg/dL (ref 8.6–10.3)
Creat: 1.45 mg/dL — ABNORMAL HIGH (ref 0.70–1.33)
GFR, Est African American: 63 mL/min/{1.73_m2} (ref >=60)
GFR, Est Non African American: 54 mL/min/{1.73_m2} — ABNORMAL LOW (ref >=60)
GLUCOSE: 96 mg/dL (ref 65–99)
Globulin: 2.4 g/dL (calc) (ref 1.9–3.7)
Potassium: 4.5 mmol/L (ref 3.5–5.3)
Sodium: 139 mmol/L (ref 135–146)
Total Bilirubin: 0.4 mg/dL (ref 0.2–1.2)
Total Protein: 6.6 g/dL (ref 6.1–8.1)

## 2018-08-16 LAB — VITAMIN D 25 HYDROXY (VIT D DEFICIENCY, FRACTURES): VIT D 25 HYDROXY: 17 ng/mL — AB (ref 30–100)

## 2018-08-20 ENCOUNTER — Ambulatory Visit: Payer: Managed Care, Other (non HMO) | Admitting: Internal Medicine

## 2018-08-20 ENCOUNTER — Encounter: Payer: Self-pay | Admitting: Internal Medicine

## 2018-08-20 VITALS — BP 140/88 | HR 83 | Temp 97.5°F | Ht 68.5 in | Wt 307.4 lb

## 2018-08-20 DIAGNOSIS — M542 Cervicalgia: Secondary | ICD-10-CM | POA: Diagnosis not present

## 2018-08-20 DIAGNOSIS — Z125 Encounter for screening for malignant neoplasm of prostate: Secondary | ICD-10-CM

## 2018-08-20 DIAGNOSIS — Z1159 Encounter for screening for other viral diseases: Secondary | ICD-10-CM

## 2018-08-20 DIAGNOSIS — I10 Essential (primary) hypertension: Secondary | ICD-10-CM | POA: Diagnosis not present

## 2018-08-20 DIAGNOSIS — M5416 Radiculopathy, lumbar region: Secondary | ICD-10-CM | POA: Diagnosis not present

## 2018-08-20 DIAGNOSIS — Z13818 Encounter for screening for other digestive system disorders: Secondary | ICD-10-CM

## 2018-08-20 DIAGNOSIS — Z23 Encounter for immunization: Secondary | ICD-10-CM | POA: Diagnosis not present

## 2018-08-20 DIAGNOSIS — Z1283 Encounter for screening for malignant neoplasm of skin: Secondary | ICD-10-CM

## 2018-08-20 DIAGNOSIS — H6123 Impacted cerumen, bilateral: Secondary | ICD-10-CM | POA: Diagnosis not present

## 2018-08-20 DIAGNOSIS — M5412 Radiculopathy, cervical region: Secondary | ICD-10-CM

## 2018-08-20 DIAGNOSIS — K760 Fatty (change of) liver, not elsewhere classified: Secondary | ICD-10-CM

## 2018-08-20 DIAGNOSIS — Z1329 Encounter for screening for other suspected endocrine disorder: Secondary | ICD-10-CM

## 2018-08-20 MED ORDER — DIAZEPAM 5 MG PO TABS: 5.0000 mg | ORAL_TABLET | ORAL | 0 refills | Status: DC | PRN
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
## 2018-08-20 NOTE — Patient Instructions (Addendum)
L theanine helps sleep, stress, anxiety  Vitamin D3 x 6 months then D3 5000 IU daily starting month 7   Results for OVA, Jake Brown (MRN [id number]) as of 08/20/2018 10:45  Ref. Range 08/13/2018 08:02  Rubella Latest Units: index <0.90 (L)  Mumps IgG Latest Units: AU/mL >300.00  Rubeola IgG Latest Units: AU/mL <13.50 (L)    Debrox ear drops   Earwax Buildup, Adult The ears produce a substance called earwax that helps keep bacteria out of the ear and protects the skin in the ear canal. Occasionally, earwax can build up in the ear and cause discomfort or hearing loss. What increases the risk? This condition is more likely to develop in people who:  Are male.  Are elderly.  Naturally produce more earwax.  Clean their ears often with cotton swabs.  Use earplugs often.  Use in-ear headphones often.  Wear hearing aids.  Have narrow ear canals.  Have earwax that is overly thick or sticky.  Have eczema.  Are dehydrated.  Have excess hair in the ear canal. What are the signs or symptoms? Symptoms of this condition include:  Reduced or muffled hearing.  A feeling of fullness in the ear or feeling that the ear is plugged.  Fluid coming from the ear.  Ear pain.  Ear itch.  Ringing in the ear.  Coughing.  An obvious piece of earwax that can be seen inside the ear canal. How is this diagnosed? This condition may be diagnosed based on:  Your symptoms.  Your medical history.  An ear exam. During the exam, your health care provider will look into your ear with an instrument called an otoscope. You may have tests, including a hearing test. How is this treated? This condition may be treated by:  Using ear drops to soften the earwax.  Having the earwax removed by a health care provider. The health care provider may: ? Flush the ear with water. ? Use an instrument that has a loop on the end (curette). ? Use a suction device.  Surgery to remove the wax  buildup. This may be done in severe cases. Follow these instructions at home:   Take over-the-counter and prescription medicines only as told by your health care provider.  Do not put any objects, including cotton swabs, into your ear. You can clean the opening of your ear canal with a washcloth or facial tissue.  Follow instructions from your health care provider about cleaning your ears. Do not over-clean your ears.  Drink enough fluid to keep your urine clear or pale yellow. This will help to thin the earwax.  Keep all follow-up visits as told by your health care provider. If earwax builds up in your ears often or if you use hearing aids, consider seeing your health care provider for routine, preventive ear cleanings. Ask your health care provider how often you should schedule your cleanings.  If you have hearing aids, clean them according to instructions from the manufacturer and your health care provider. Contact a health care provider if:  You have ear pain.  You develop a fever.  You have blood, pus, or other fluid coming from your ear.  You have hearing loss.  You have ringing in your ears that does not go away.  Your symptoms do not improve with treatment.  You feel like the room is spinning (vertigo). Summary  Earwax can build up in the ear and cause discomfort or hearing loss.  The most common symptoms of this  condition include reduced or muffled hearing and a feeling of fullness in the ear or feeling that the ear is plugged.  This condition may be diagnosed based on your symptoms, your medical history, and an ear exam.  This condition may be treated by using ear drops to soften the earwax or by having the earwax removed by a health care provider.  Do not put any objects, including cotton swabs, into your ear. You can clean the opening of your ear canal with a washcloth or facial tissue. This information is not intended to replace advice given to you by your health  care provider. Make sure you discuss any questions you have with your health care provider. Document Released: 08/14/2004 Document Revised: 06/18/2017 Document Reviewed: 09/17/2016 Elsevier Interactive Patient Education  2019 [employer].  Insomnia Insomnia is a sleep disorder that makes it difficult to fall asleep or stay asleep. Insomnia can cause fatigue, low energy, difficulty concentrating, mood swings, and poor performance at work or school. There are three different ways to classify insomnia:  Difficulty falling asleep.  Difficulty staying asleep.  Waking up too early in the morning. Any type of insomnia can be long-term (chronic) or short-term (acute). Both are common. Short-term insomnia usually lasts for three months or less. Chronic insomnia occurs at least three times a week for longer than three months. What are the causes? Insomnia may be caused by another condition, situation, or substance, such as:  Anxiety.  Certain medicines.  Gastroesophageal reflux disease (GERD) or other gastrointestinal conditions.  Asthma or other breathing conditions.  Restless legs syndrome, sleep apnea, or other sleep disorders.  Chronic pain.  Menopause.  Stroke.  Abuse of alcohol, tobacco, or illegal drugs.  Mental health conditions, such as depression.  Caffeine.  Neurological disorders, such as Alzheimer's disease.  An overactive thyroid (hyperthyroidism). Sometimes, the cause of insomnia may not be known. What increases the risk? Risk factors for insomnia include:  Gender. Women are affected more often than men.  Age. Insomnia is more common as you get older.  Stress.  Lack of exercise.  Irregular work schedule or working night shifts.  Traveling between different time zones.  Certain medical and mental health conditions. What are the signs or symptoms? If you have insomnia, the main symptom is having trouble falling asleep or having trouble staying asleep.  This may lead to other symptoms, such as:  Feeling fatigued or having low energy.  Feeling nervous about going to sleep.  Not feeling rested in the morning.  Having trouble concentrating.  Feeling irritable, anxious, or depressed. How is this diagnosed? This condition may be diagnosed based on:  Your symptoms and medical history. Your health care provider may ask about: ? Your sleep habits. ? Any medical conditions you have. ? Your mental health.  A physical exam. How is this treated? Treatment for insomnia depends on the cause. Treatment may focus on treating an underlying condition that is causing insomnia. Treatment may also include:  Medicines to help you sleep.  Counseling or therapy.  Lifestyle adjustments to help you sleep better. Follow these instructions at home: Eating and drinking   Limit or avoid alcohol, caffeinated beverages, and cigarettes, especially close to bedtime. These can disrupt your sleep.  Do not eat a large meal or eat spicy foods right before bedtime. This can lead to digestive discomfort that can make it hard for you to sleep. Sleep habits   Keep a sleep diary to help you and your health care provider  figure out what could be causing your insomnia. Write down: ? When you sleep. ? When you wake up during the night. ? How well you sleep. ? How rested you feel the next day. ? Any side effects of medicines you are taking. ? What you eat and drink.  Make your bedroom a dark, comfortable place where it is easy to fall asleep. ? Put up shades or blackout curtains to block light from outside. ? Use a white noise machine to block noise. ? Keep the temperature cool.  Limit screen use before bedtime. This includes: ? Watching TV. ? Using your smartphone, tablet, or computer.  Stick to a routine that includes going to bed and waking up at the same times every day and night. This can help you fall asleep faster. Consider making a quiet activity,  such as reading, part of your nighttime routine.  Try to avoid taking naps during the day so that you sleep better at night.  Get out of bed if you are still awake after 15 minutes of trying to sleep. Keep the lights down, but try reading or doing a quiet activity. When you feel sleepy, go back to bed. General instructions  Take over-the-counter and prescription medicines only as told by your health care provider.  Exercise regularly, as told by your health care provider. Avoid exercise starting several hours before bedtime.  Use relaxation techniques to manage stress. Ask your health care provider to suggest some techniques that may work well for you. These may include: ? Breathing exercises. ? Routines to release muscle tension. ? Visualizing peaceful scenes.  Make sure that you drive carefully. Avoid driving if you feel very sleepy.  Keep all follow-up visits as told by your health care provider. This is important. Contact a health care provider if:  You are tired throughout the day.  You have trouble in your daily routine due to sleepiness.  You continue to have sleep problems, or your sleep problems get worse. Get help right away if:  You have serious thoughts about hurting yourself or someone else. If you ever feel like you may hurt yourself or others, or have thoughts about taking your own life, get help right away. You can go to your nearest emergency department or call:  Your local emergency services (911 in the [place]  A suicide crisis helpline, such as the [facility] at 936-761-4726. This is open 24 hours a day. Summary  Insomnia is a sleep disorder that makes it difficult to fall asleep or stay asleep.  Insomnia can be long-term (chronic) or short-term (acute).  Treatment for insomnia depends on the cause. Treatment may focus on treating an underlying condition that is causing insomnia.  Keep a sleep diary to help you and your health  care provider figure out what could be causing your insomnia. This information is not intended to replace advice given to you by your health care provider. Make sure you discuss any questions you have with your health care provider. Document Released: 07/04/2000 Document Revised: 04/16/2017 Document Reviewed: 04/16/2017 Elsevier Interactive Patient Education  2019 [employer].

## 2018-09-03 ENCOUNTER — Other Ambulatory Visit: Payer: Self-pay | Admitting: Internal Medicine

## 2018-09-06 ENCOUNTER — Other Ambulatory Visit: Payer: PRIVATE HEALTH INSURANCE

## 2018-09-17 ENCOUNTER — Other Ambulatory Visit: Payer: PRIVATE HEALTH INSURANCE

## 2018-09-27 ENCOUNTER — Telehealth: Payer: Self-pay | Admitting: Internal Medicine

## 2018-09-27 NOTE — Telephone Encounter (Signed)
MRI cervical and lumbar denied by insurance and once denied with this type of insurance they will not re-approve denied case for now   If back pain is continuing I rec he make appt with Dr. Electa Sniff (neurosurgery) or try to get approved with Dr. Hardin Negus his pain doctor either may be able to get MRIS approved  [place]

## 2018-09-28 NOTE — Telephone Encounter (Signed)
Patient has been informed. He was also informed that in the computer PCP was wrong, he was wondering if that would have been an issue/resone why it didn't get approved.

## 2018-10-03 NOTE — Telephone Encounter (Signed)
I do not think so  Jake Brown what to do think when I called and told them who I was they denied MRIs C and L spine

## 2018-10-06 ENCOUNTER — Telehealth: Payer: Self-pay | Admitting: Internal Medicine

## 2018-10-06 NOTE — Telephone Encounter (Signed)
Copied from [place] [id number]. Topic: General - Other >> Oct 06, 2018  2:17 PM Lennox Solders wrote: Reason for CRM: Pt is calling and would like a letter on our letterhead that he is totally disable and unable to work due to a stroke. Pt is trying to get food stamps. Pt will pick up letter when ready

## 2018-10-11 ENCOUNTER — Encounter: Payer: Self-pay | Admitting: Internal Medicine

## 2018-10-11 IMAGING — US US CAROTID DUPLEX BILAT
1 series · 13 of 24 positions shown · non-contrast
Comparison: None.

CLINICAL DATA: Stroke symptoms, hypertension, hyperlipidemia

EXAM:
BILATERAL CAROTID DUPLEX ULTRASOUND
TECHNIQUE: Gray scale imaging, color Doppler and duplex ultrasound were
performed of bilateral carotid and vertebral arteries in the neck.

[Series 1: us carotid duplex bilat · 13 of 65 slices shown]
[im 1/65]
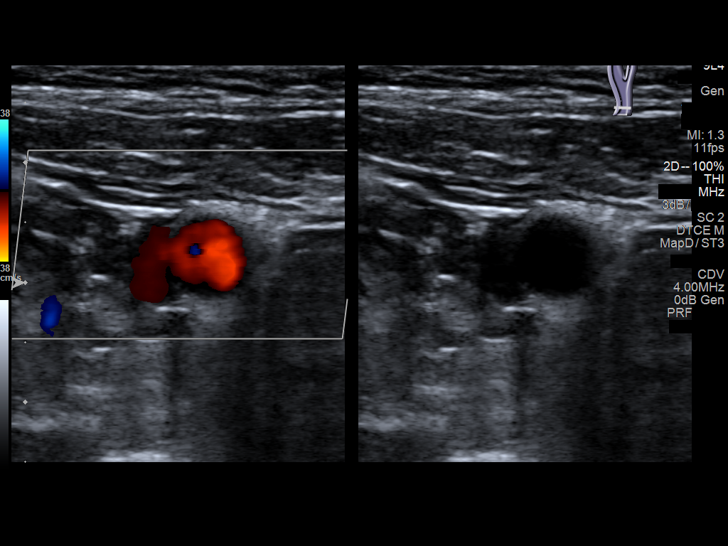
[im 6/65]
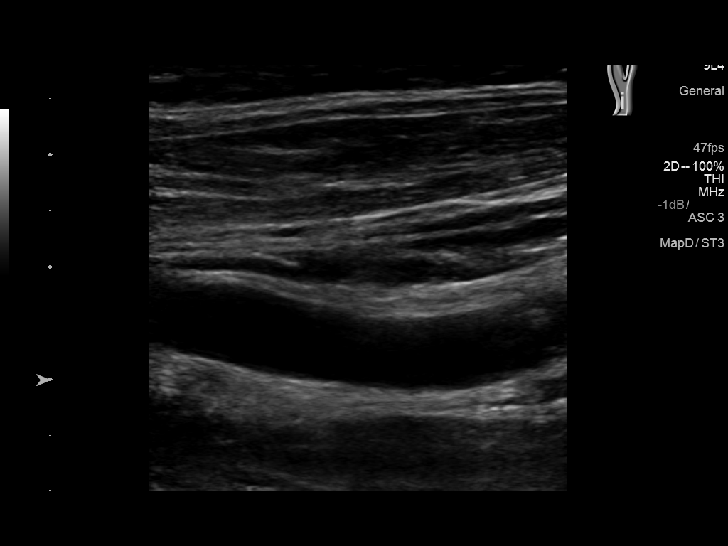
[im 12/65]
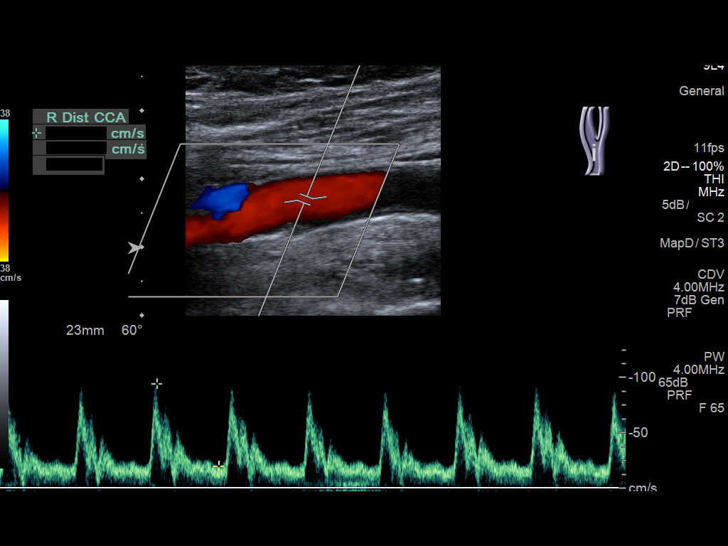
[im 17/65]
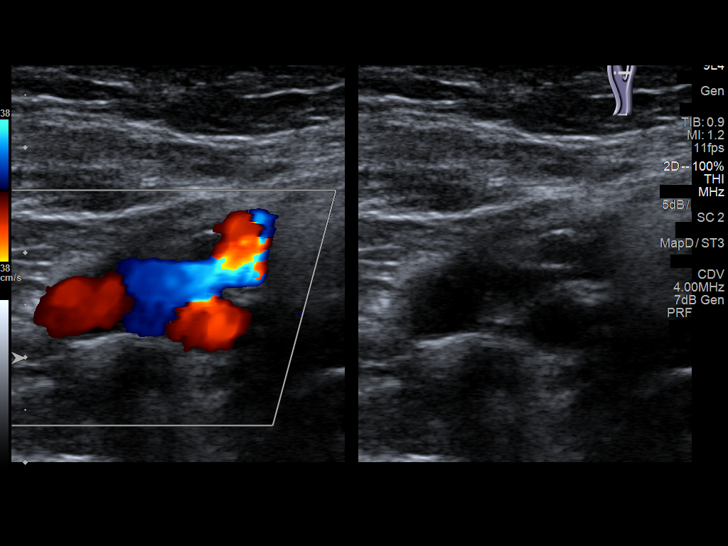
[im 23/65]
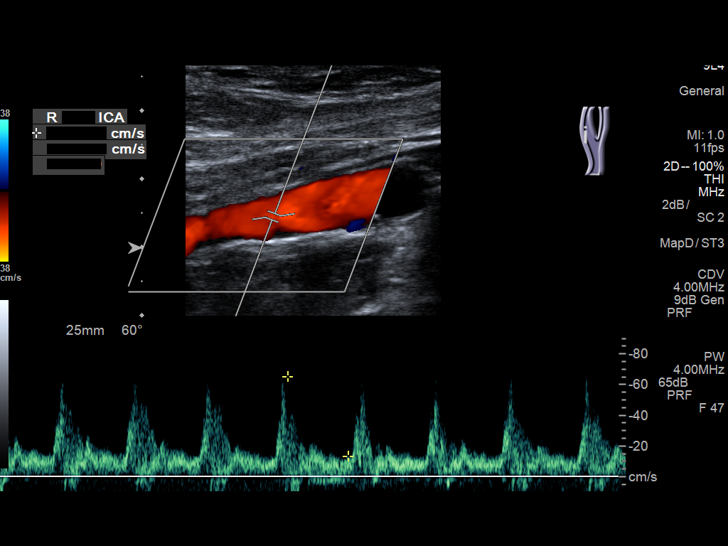
[im 28/65]
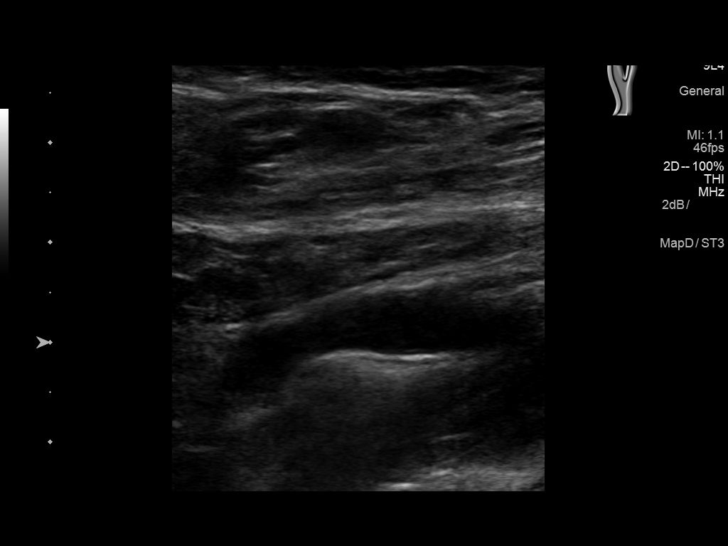
[im 34/65]
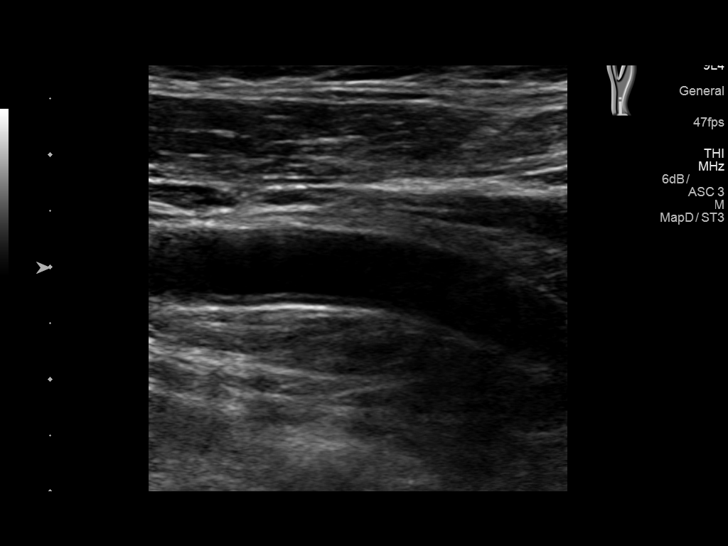
[im 37/65]
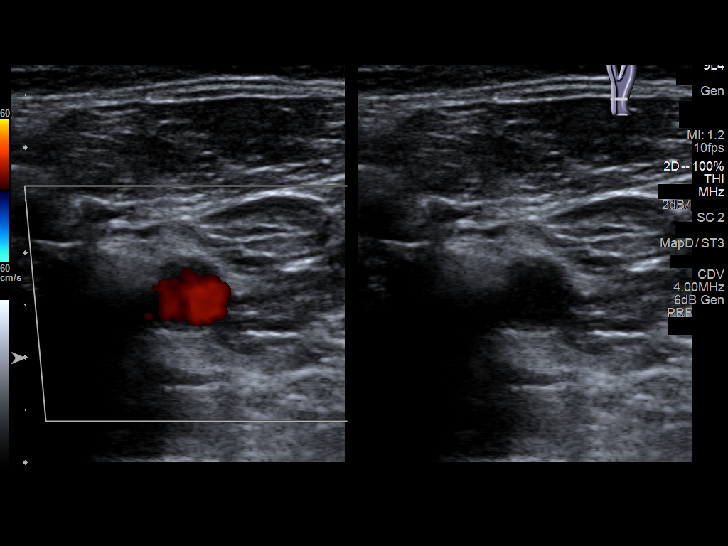
[im 42/65]
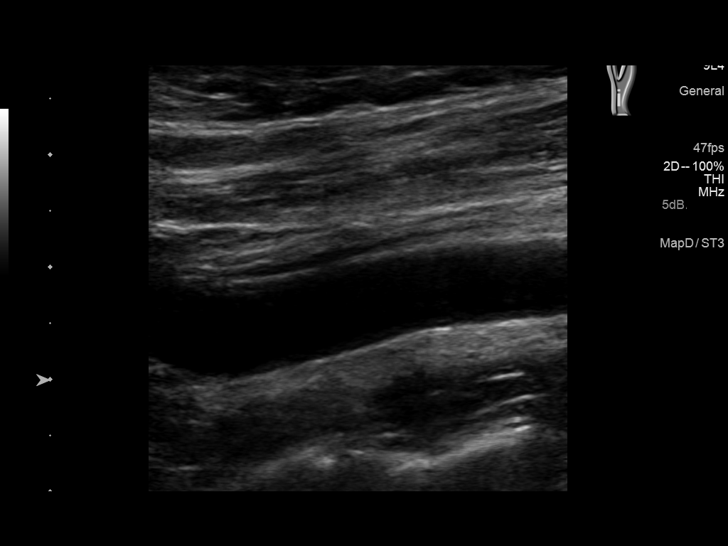
[im 48/65]
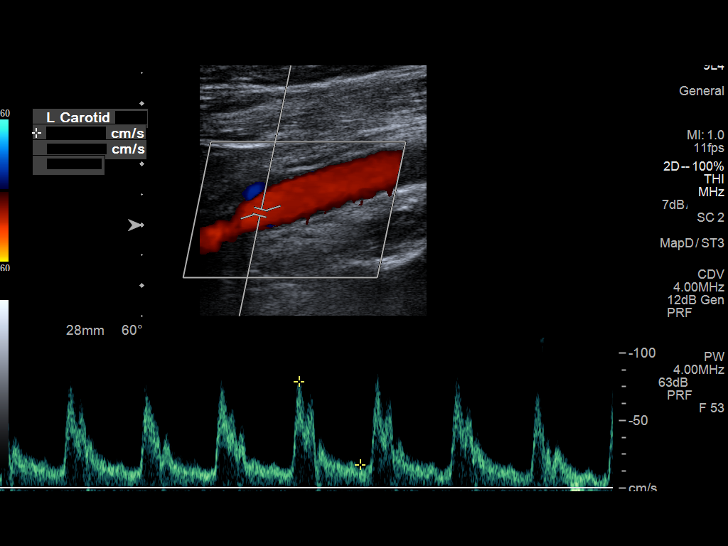
[im 53/65]
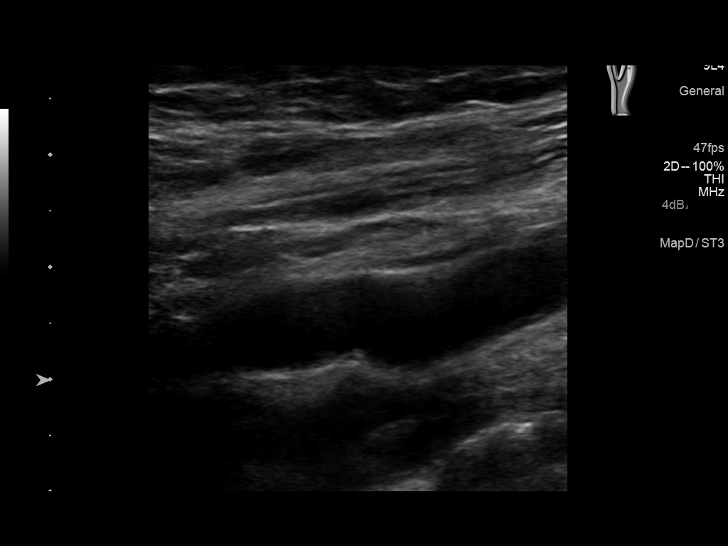
[im 59/65]
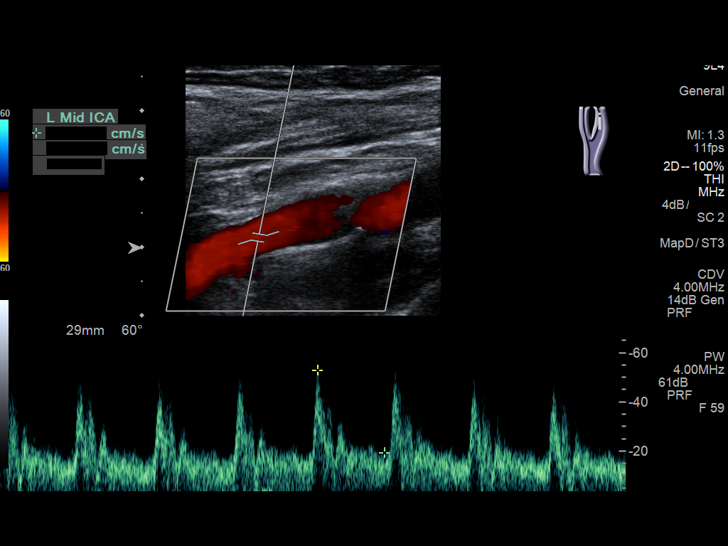
[im 65/65]
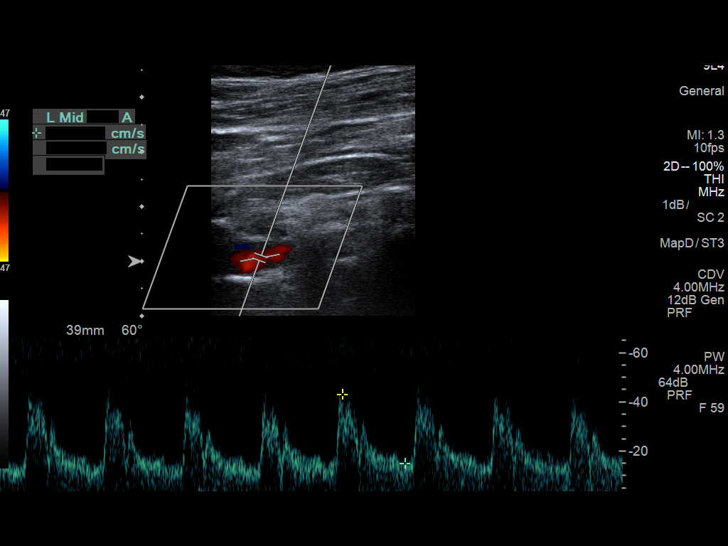

[13 of 24 positions shown; findings below may reference images not displayed]

FINDINGS: Criteria: Quantification of carotid stenosis is based on velocity
parameters that correlate the residual internal carotid diameter
with NASCET-based stenosis levels, using the diameter of the distal
internal carotid lumen as the denominator for stenosis measurement.

The following velocity measurements were obtained:

RIGHT

ICA:  65/13 cm/sec

CCA:  103/17 cm/sec

SYSTOLIC ICA/CCA RATIO:

DIASTOLIC ICA/CCA RATIO:

ECA:  133 cm/sec

LEFT

ICA:  58/18 cm/sec

CCA:  106/21 cm/sec

SYSTOLIC ICA/CCA RATIO:

DIASTOLIC ICA/CCA RATIO:

ECA:  95 cm/sec

RIGHT CAROTID ARTERY: Minor echogenic shadowing plaque formation. No
hemodynamically significant right ICA stenosis, velocity elevation,
or turbulent flow. Degree of narrowing less than 50%.

RIGHT VERTEBRAL ARTERY:  Antegrade

LEFT CAROTID ARTERY: Similar scattered minor echogenic plaque
formation. No hemodynamically significant left ICA stenosis,
velocity elevation, or turbulent flow.

LEFT VERTEBRAL ARTERY:  Antegrade
IMPRESSION: Minor carotid atherosclerosis. No hemodynamically significant ICA
stenosis. Degree of narrowing less than 50% bilaterally by
ultrasound criteria.

Patent antegrade vertebral flow bilaterally

## 2018-10-11 NOTE — Telephone Encounter (Signed)
Letter and other documents on the printer please get them off Monday and put in my box  Call pt Tuesday once I can sign the letter to let him know ready for pick up   Thanks [place]

## 2018-10-12 NOTE — Telephone Encounter (Signed)
Left message for patient to return call back. [facility] may give information.  Letter has been placed up front for patient to pick up.

## 2018-11-30 ENCOUNTER — Other Ambulatory Visit: Payer: Self-pay | Admitting: Internal Medicine

## 2018-11-30 DIAGNOSIS — I10 Essential (primary) hypertension: Secondary | ICD-10-CM

## 2018-11-30 DIAGNOSIS — E785 Hyperlipidemia, unspecified: Secondary | ICD-10-CM

## 2018-11-30 MED ORDER — LISINOPRIL 10 MG PO TABS: 10.0000 mg | ORAL_TABLET | Freq: Every day | ORAL | 3 refills | Status: DC

## 2018-11-30 MED ORDER — ATORVASTATIN CALCIUM 40 MG PO TABS: 40.0000 mg | ORAL_TABLET | Freq: Every day | ORAL | 3 refills | Status: DC

## 2018-12-20 ENCOUNTER — Other Ambulatory Visit: Payer: Self-pay

## 2018-12-20 ENCOUNTER — Other Ambulatory Visit (INDEPENDENT_AMBULATORY_CARE_PROVIDER_SITE_OTHER): Payer: PRIVATE HEALTH INSURANCE

## 2018-12-20 DIAGNOSIS — Z125 Encounter for screening for malignant neoplasm of prostate: Secondary | ICD-10-CM | POA: Diagnosis not present

## 2018-12-20 DIAGNOSIS — Z1329 Encounter for screening for other suspected endocrine disorder: Secondary | ICD-10-CM

## 2018-12-20 DIAGNOSIS — Z1159 Encounter for screening for other viral diseases: Secondary | ICD-10-CM

## 2018-12-20 DIAGNOSIS — Z13818 Encounter for screening for other digestive system disorders: Secondary | ICD-10-CM

## 2018-12-20 DIAGNOSIS — K760 Fatty (change of) liver, not elsewhere classified: Secondary | ICD-10-CM

## 2018-12-20 DIAGNOSIS — I10 Essential (primary) hypertension: Secondary | ICD-10-CM | POA: Diagnosis not present

## 2018-12-20 LAB — COMPREHENSIVE METABOLIC PANEL
ALT: 17 U/L (ref 0–53)
AST: 15 U/L (ref 0–37)
Albumin: 4 g/dL (ref 3.5–5.2)
Alkaline Phosphatase: 100 U/L (ref 39–117)
BUN: 20 mg/dL (ref 6–23)
CO2: 31 mEq/L (ref 19–32)
Calcium: 9.5 mg/dL (ref 8.4–10.5)
Chloride: 100 mEq/L (ref 96–112)
Creatinine, Ser: 1.46 mg/dL (ref 0.40–1.50)
GFR: 50.09 mL/min — ABNORMAL LOW (ref >60.00)
Glucose, Bld: 93 mg/dL (ref 70–99)
Potassium: 4.4 mEq/L (ref 3.5–5.1)
Sodium: 139 mEq/L (ref 135–145)
Total Bilirubin: 0.7 mg/dL (ref 0.2–1.2)
Total Protein: 6.8 g/dL (ref 6.0–8.3)

## 2018-12-20 LAB — TSH: TSH: 1.05 u[IU]/mL (ref 0.35–4.50)

## 2018-12-20 LAB — CBC WITH DIFFERENTIAL/PLATELET
Basophils Absolute: 0 10*3/uL (ref 0.0–0.1)
Basophils Relative: 0.4 % (ref 0.0–3.0)
Eosinophils Absolute: 0.5 10*3/uL (ref 0.0–0.7)
Eosinophils Relative: 4.6 % (ref 0.0–5.0)
HCT: 40.8 % (ref 39.0–52.0)
Hemoglobin: 13.9 g/dL (ref 13.0–17.0)
Lymphocytes Relative: 19.1 % (ref 12.0–46.0)
Lymphs Abs: 1.9 10*3/uL (ref 0.7–4.0)
MCHC: 34.1 g/dL (ref 30.0–36.0)
MCV: 90.7 fl (ref 78.0–100.0)
Monocytes Absolute: 0.6 10*3/uL (ref 0.1–1.0)
Monocytes Relative: 6 % (ref 3.0–12.0)
Neutro Abs: 6.9 10*3/uL (ref 1.4–7.7)
Neutrophils Relative %: 69.9 % (ref 43.0–77.0)
Platelets: 241 10*3/uL (ref 150.0–400.0)
RBC: 4.5 Mil/uL (ref 4.22–5.81)
RDW: 14.3 % (ref 11.5–15.5)
WBC: 9.9 10*3/uL (ref 4.0–10.5)

## 2018-12-20 LAB — PSA: PSA: 0.26 ng/mL (ref 0.10–4.00)

## 2018-12-21 LAB — HEPATITIS A ANTIBODY, TOTAL: Hepatitis A AB,Total: NONREACTIVE

## 2018-12-21 LAB — HEPATITIS C ANTIBODY
Hepatitis C Ab: NONREACTIVE
SIGNAL TO CUT-OFF: 0.01 (ref <1.00)

## 2018-12-21 LAB — HEPATITIS B SURFACE ANTIBODY, QUANTITATIVE: Hep B S AB Quant (Post): <5 m[IU]/mL — ABNORMAL LOW (ref >=10)

## 2018-12-28 ENCOUNTER — Ambulatory Visit (INDEPENDENT_AMBULATORY_CARE_PROVIDER_SITE_OTHER): Payer: PRIVATE HEALTH INSURANCE | Admitting: Internal Medicine

## 2018-12-28 ENCOUNTER — Other Ambulatory Visit: Payer: Self-pay

## 2018-12-28 DIAGNOSIS — Z85528 Personal history of other malignant neoplasm of kidney: Secondary | ICD-10-CM | POA: Diagnosis not present

## 2018-12-28 DIAGNOSIS — N5089 Other specified disorders of the male genital organs: Secondary | ICD-10-CM

## 2018-12-28 DIAGNOSIS — M545 Low back pain, unspecified: Secondary | ICD-10-CM

## 2018-12-28 DIAGNOSIS — N183 Chronic kidney disease, stage 3 unspecified: Secondary | ICD-10-CM

## 2018-12-28 DIAGNOSIS — E559 Vitamin D deficiency, unspecified: Secondary | ICD-10-CM

## 2018-12-28 DIAGNOSIS — G894 Chronic pain syndrome: Secondary | ICD-10-CM | POA: Diagnosis not present

## 2018-12-28 DIAGNOSIS — M542 Cervicalgia: Secondary | ICD-10-CM

## 2018-12-28 DIAGNOSIS — G8929 Other chronic pain: Secondary | ICD-10-CM

## 2018-12-28 DIAGNOSIS — I10 Essential (primary) hypertension: Secondary | ICD-10-CM
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
## 2018-12-28 NOTE — Patient Instructions (Addendum)
Take 1x per week high dose vitamin D3 50K weekly then after 6 months take over the counter D3 2000-5000 IU daily  Take care and continue to wear your mask  Check to see if your insurance will cover a colonoscopy you are due as well    Vitamin D Deficiency Vitamin D deficiency is when your body does not have enough vitamin D. Vitamin D is important to your body for many reasons:  It helps the body to absorb two important minerals, called calcium and phosphorus.  It plays a role in bone health.  It may help to prevent some diseases, such as diabetes and multiple sclerosis.  It plays a role in muscle function, including heart function. You can get vitamin D by:  Eating foods that naturally contain vitamin D.  Eating or drinking milk or other dairy products that have vitamin D added to them.  Taking a vitamin D supplement or a multivitamin supplement that contains vitamin D.  Being in the sun. Your body naturally makes vitamin D when your skin is exposed to sunlight. Your body changes the sunlight into a form of the vitamin that the body can use. If vitamin D deficiency is severe, it can cause a condition in which your bones become soft. In adults, this condition is called osteomalacia. In children, this condition is called rickets. What are the causes? Vitamin D deficiency may be caused by:  Not eating enough foods that contain vitamin D.  Not getting enough sun exposure.  Having certain digestive system diseases that make it difficult for your body to absorb vitamin D. These diseases include Crohn disease, chronic pancreatitis, and cystic fibrosis.  Having a surgery in which a part of the stomach or a part of the small intestine is removed.  Being obese.  Having chronic kidney disease or liver disease. What increases the risk? This condition is more likely to develop in:  Older people.  People who do not spend much time outdoors.  People who live in a long-term care  facility.  People who have had broken bones.  People with weak or thin bones (osteoporosis).  People who have a disease or condition that changes how the body absorbs vitamin D.  People who have dark skin.  People who take certain medicines, such as steroid medicines or certain seizure medicines.  People who are overweight or obese. What are the signs or symptoms? In mild cases of vitamin D deficiency, there may not be any symptoms. If the condition is severe, symptoms may include:  Bone pain.  Muscle pain.  Falling often.  Broken bones caused by a minor injury. How is this diagnosed? This condition is usually diagnosed with a blood test. How is this treated? Treatment for this condition may depend on what caused the condition. Treatment options include:  Taking vitamin D supplements.  Taking a calcium supplement. Your health care provider will suggest what dose is best for you. Follow these instructions at home:  Take medicines and supplements only as told by your health care provider.  Eat foods that contain vitamin D. Choices include: ? Fortified dairy products, cereals, or juices. Fortified means that vitamin D has been added to the food. Check the label on the package to be sure. ? Fatty fish, such as salmon or trout. ? Eggs. ? Oysters.  Do not use a tanning bed.  Maintain a healthy weight. Lose weight, if needed.  Keep all follow-up visits as told by your health care provider. This  is important. Contact a health care provider if:  Your symptoms do not go away.  You feel like throwing up (nausea) or you throw up (vomit).  You have fewer bowel movements than usual or it is difficult for you to have a bowel movement (constipation). This information is not intended to replace advice given to you by your health care provider. Make sure you discuss any questions you have with your health care provider. Document Released: 09/29/2011 Document Revised: 12/19/2015  Document Reviewed: 11/22/2014 Elsevier Interactive Patient Education  2019 [place].  Scrotal Swelling Scrotal swelling refers to a condition in which the sac of skin that contains the testes (scrotum) is enlarged or swollen. Many things can cause the scrotum to enlarge or swell, including:  Fluid around the testicle (hydrocele).  A weakened area in the muscles around the groin (hernia).  An enlarged vein around the testicle (varicocele).  An injury.  An infection.  Certain medical treatments.  Certain medical conditions, such as congestive heart failure.  A recent genital surgery or procedure.  A twisting of the spermatic cord that cuts off blood supply (testicular torsion).  Testicular cancer. Scrotal swelling can happen along with scrotal pain. Follow these instructions at home:  Until the swelling goes away: ? Rest. The best position to rest in is to lie down. ? Limit activity.  Put ice on the scrotum: ? Put ice in a plastic bag. ? Place a towel between your skin and the bag. ? Leave the ice on for 20 minutes, 2-3 times a day for 1-2 days.  Place a rolled towel under your testicles for support.  Wear loose-fitting clothing or an athletic support cup for comfort.  Take over-the-counter and prescription medicines only as told by your health care provider.  Perform a monthly self-exam of the scrotum and penis. Feel for changes. Ask your health care provider how to perform a monthly self-exam if you are unsure. Contact a health care provider if:  You have a sudden pain that is persistent and does not improve.  You have a heavy feeling or notice fluid in the scrotum.  You have pain or burning while urinating.  You have blood in your urine or semen.  You feel a lump around the testicle.  You notice that one testicle is larger than the other. Keep in mind that a small difference in size is normal.  You have a persistent dull ache or pain in your groin or  scrotum. Get help right away if:  The pain does not go away.  The pain becomes severe.  You have a fever or chills.  You have pain or vomiting that cannot be controlled.  One or both sides of the scrotum are very red and swollen.  There is redness spreading upward from your scrotum to your abdomen or downward from your scrotum to your thighs. Summary  Scrotal swelling refers to a condition in which the sac of skin that contains the testes (scrotum) is enlarged.  Many things can cause the scrotum to swell, including hydrocele, a hernia, and a varicocele.  Limiting activity and icing the scrotum may help reduce swelling and pain.  Contact your health care provider if you develop scrotal pain that is sudden and persistent, or if you have pain while urinating. Do this also if you feel a lump around the testicle or notice blood in your urine or semen.  Get help right away for uncontrolled pain or vomiting, for very red and swollen scrotum,  or for fever or chills. This information is not intended to replace advice given to you by your health care provider. Make sure you discuss any questions you have with your health care provider. Document Released: 08/09/2010 Document Revised: 09/22/2016 Document Reviewed: 09/22/2016 Elsevier Interactive Patient Education  2019 [employer].

## 2019-01-06 ENCOUNTER — Ambulatory Visit
Admission: RE | Admit: 2019-01-06 | Discharge: 2019-01-06 | Disposition: A | Discharge status: Home / Self Care | Payer: PRIVATE HEALTH INSURANCE | Source: Ambulatory Visit | Attending: Internal Medicine | Admitting: Internal Medicine

## 2019-01-06 ENCOUNTER — Other Ambulatory Visit: Payer: Self-pay

## 2019-01-06 DIAGNOSIS — N5089 Other specified disorders of the male genital organs: Secondary | ICD-10-CM | POA: Diagnosis present

## 2019-01-07 ENCOUNTER — Other Ambulatory Visit: Payer: Self-pay | Admitting: Internal Medicine

## 2019-01-07 DIAGNOSIS — N442 Benign cyst of testis: Secondary | ICD-10-CM

## 2019-01-07 DIAGNOSIS — Z85528 Personal history of other malignant neoplasm of kidney: Secondary | ICD-10-CM

## 2019-02-18 ENCOUNTER — Other Ambulatory Visit: Payer: Self-pay | Admitting: Gastroenterology

## 2019-02-18 DIAGNOSIS — R1314 Dysphagia, pharyngoesophageal phase: Secondary | ICD-10-CM

## 2019-02-28 ENCOUNTER — Telehealth: Payer: Self-pay

## 2019-02-28 NOTE — Telephone Encounter (Signed)
Copied from [place] [id number]. Topic: Referral - Request for Referral >> Feb 28, 2019  3:30 PM Nils Flack wrote: Has patient seen PCP for this complaint? Yes.   *If NO, is insurance requiring patient see PCP for this issue before PCP can refer them? Referral for which specialty: urology Preferred provider/office: [facility] Reason for referral: cyst on testicle, has had ultrasound

## 2019-03-01 ENCOUNTER — Other Ambulatory Visit: Payer: Self-pay | Admitting: Internal Medicine

## 2019-03-01 DIAGNOSIS — R9389 Abnormal findings on diagnostic imaging of other specified body structures: Secondary | ICD-10-CM

## 2019-03-01 DIAGNOSIS — C649 Malignant neoplasm of unspecified kidney, except renal pelvis: Secondary | ICD-10-CM

## 2019-03-01 DIAGNOSIS — N442 Benign cyst of testis: Secondary | ICD-10-CM

## 2019-03-01 NOTE — Telephone Encounter (Signed)
Appears he wants to go to [facility] not [facility]

## 2019-03-02 ENCOUNTER — Other Ambulatory Visit: Payer: Self-pay

## 2019-03-02 ENCOUNTER — Ambulatory Visit
Admission: RE | Admit: 2019-03-02 | Discharge: 2019-03-02 | Disposition: A | Discharge status: Home / Self Care | Payer: PRIVATE HEALTH INSURANCE | Source: Ambulatory Visit | Attending: Gastroenterology | Admitting: Gastroenterology

## 2019-03-02 DIAGNOSIS — R1314 Dysphagia, pharyngoesophageal phase: Secondary | ICD-10-CM

## 2019-03-29 IMAGING — MR MR MRA HEAD W/O CM
10 of 11 series · 33 of 48 positions shown · non-contrast
Comparison: CT HEAD October 15, 2017

CLINICAL DATA: LEFT extremity weakness since yesterday, worsening
today. Slurred speech. Follow-up potential RIGHT thalamus lacunar
infarct. History of hypertension, hyperlipidemia and renal cancer.

EXAM:
MRI HEAD WITHOUT CONTRAST
MRA HEAD WITHOUT CONTRAST
TECHNIQUE: Multiplanar, multiecho pulse sequences of the brain and surrounding
structures were obtained without intravenous contrast. Angiographic
images of the head were obtained using MRA technique without
contrast.

[Series 3: DWI · axial · 4.0mm · 0.94mm/px · z∈[-47,+109]mm · 3 of 42 slices shown (1 of 2)]
[im 1/42]
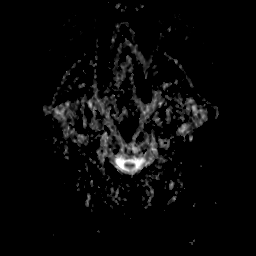
[im 21/42]
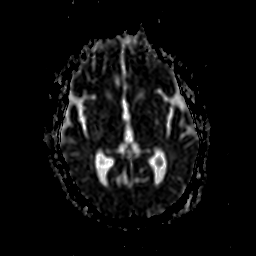
[im 42/42]
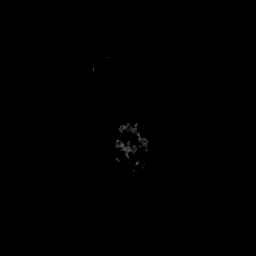

[Series 5: DWI · coronal · 5.0mm · 1.80mm/px · 4 of 38 slices shown (2 of 2)]
[im 1/38]
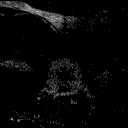
[im 13/38]
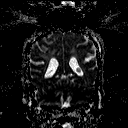
[im 25/38]
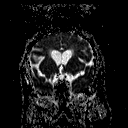
[im 38/38]
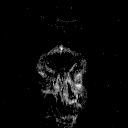

[Series 6: ax (id) · axial · 4.0mm · 0.94mm/px · z∈[-47,+105]mm · 4 of 41 slices shown]
[im 1/41]
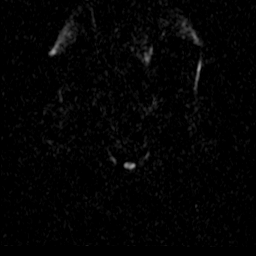
[im 14/41]
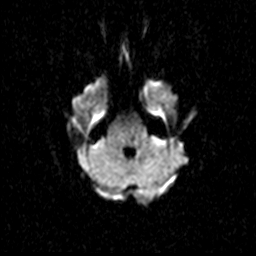
[im 27/41]
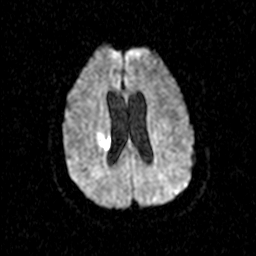
[im 41/41]
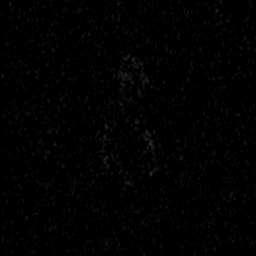

[Series 7: cor (id) · coronal · 5.0mm · 1.80mm/px · 2 of 36 slices shown]
[im 1/36]
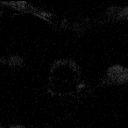
[im 12/36]
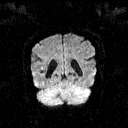

[Series 12: FLAIR · axial · 5.0mm · 0.90mm/px · z∈[-43,+105]mm · 3 of 25 slices shown]
[im 1/25]
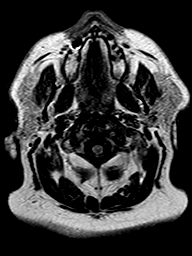
[im 13/25]
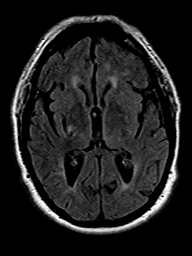
[im 25/25]
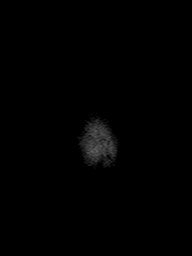

[Series 14: T2 · axial · 5.0mm · 0.45mm/px · z∈[-43,+105]mm · 3 of 25 slices shown (1 of 3)]
[im 1/25]
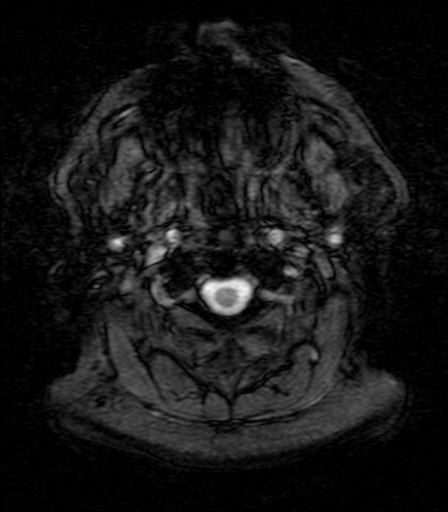
[im 13/25]
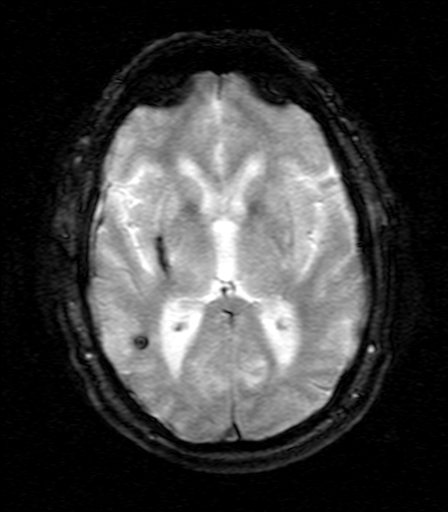
[im 25/25]
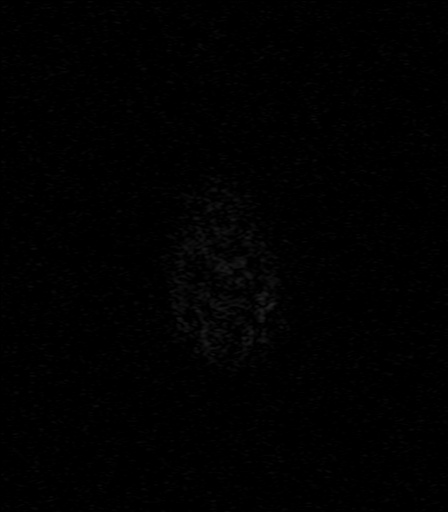

[Series 17: T2 · axial · 5.0mm · 0.45mm/px · z∈[-43,+105]mm · 3 of 25 slices shown (2 of 3)]
[im 1/25]
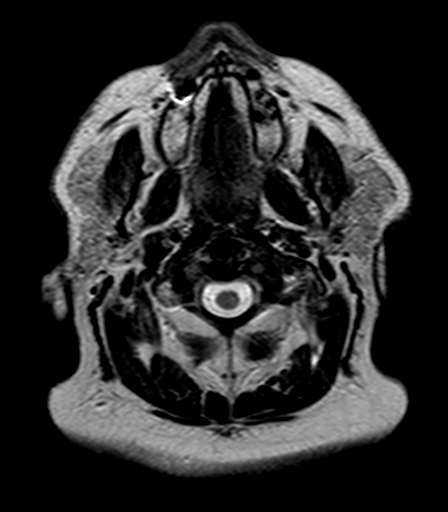
[im 13/25]
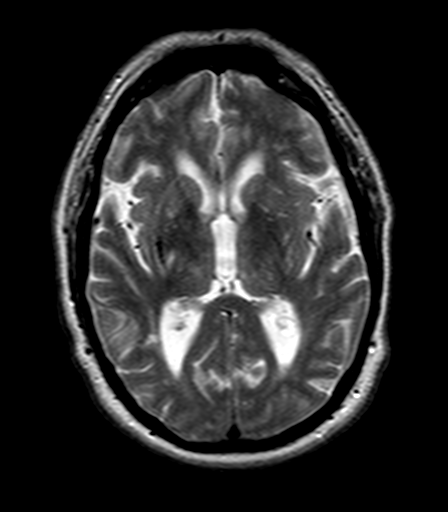
[im 25/25]
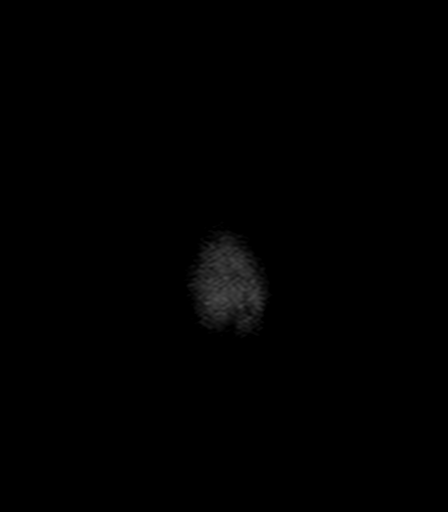

[Series 19: T1 · sagittal · 5.0mm · 0.45mm/px · 3 of 29 slices shown (1 of 2)]
[im 1/29]
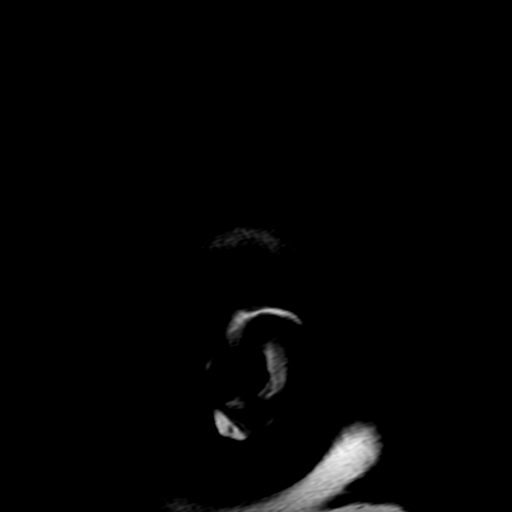
[im 15/29]
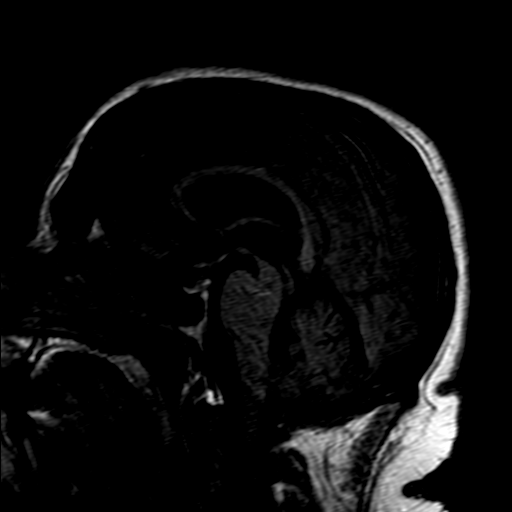
[im 29/29]
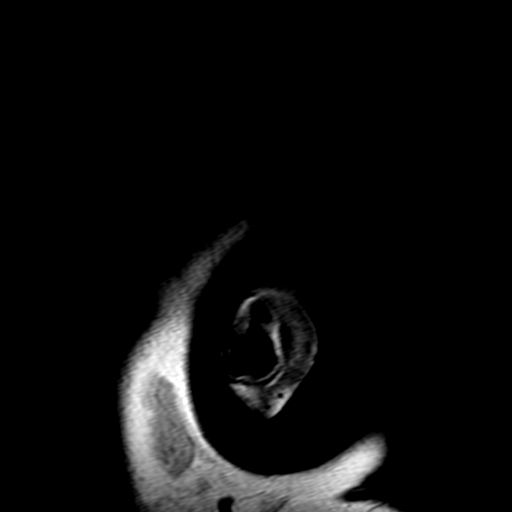

[Series 21: T1 · axial · 3.0mm · 0.45mm/px · z∈[-42,+103]mm · 5 of 52 slices shown (2 of 2)]
[im 1/52]
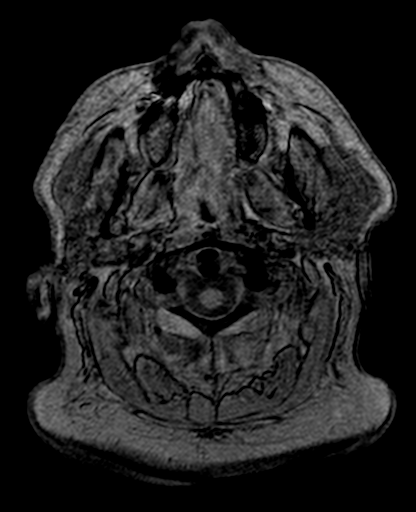
[im 13/52]
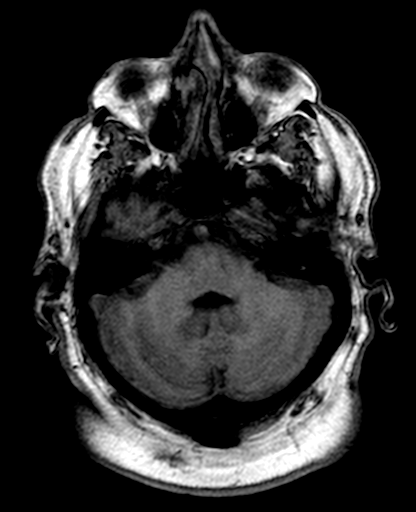
[im 26/52]
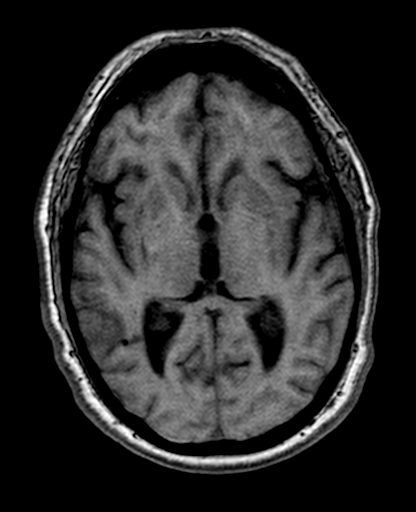
[im 39/52]
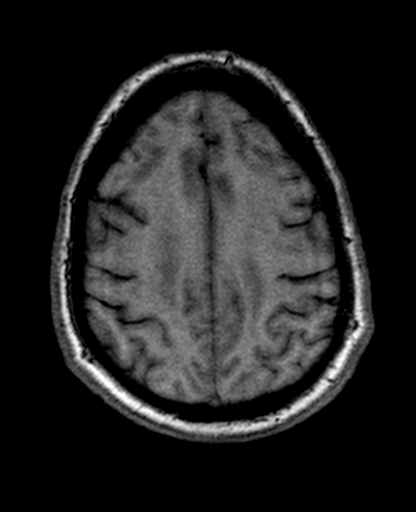
[im 52/52]
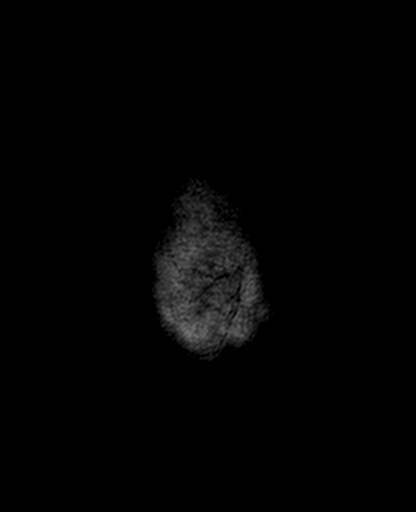

[Series 23: T2 · coronal · 5.0mm · 0.45mm/px · 3 of 30 slices shown (3 of 3)]
[im 1/30]
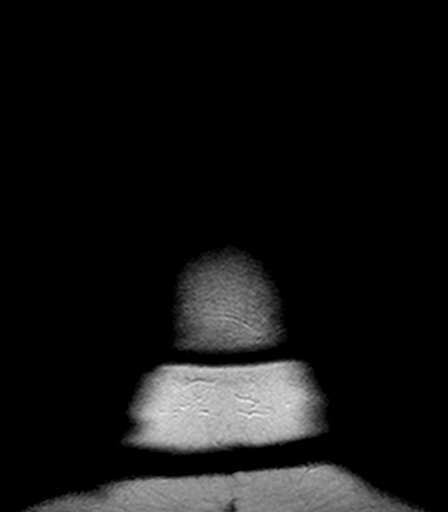
[im 15/30]
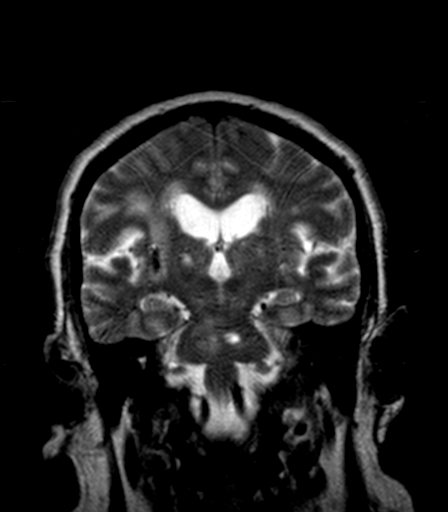
[im 30/30]
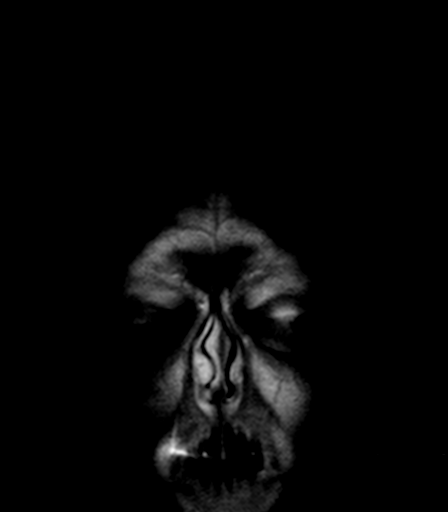

[33 of 48 positions shown; findings below may reference images not displayed]

FINDINGS: MRI HEAD FINDINGS

INTRACRANIAL CONTENTS: 11 x 11 mm reduced diffusion RIGHT basal
ganglia to corona radiata. Old RIGHT basal ganglia infarct with
susceptibility artifact. Extensive scattered micro hemorrhages
within supra-and infratentorial brain. Old RIGHT thalamus lacunar
infarct. Patchy to confluent supratentorial white matter FLAIR T2
hyperintensities, cystic component RIGHT centrum semiovale. Old
RIGHT mesial thalamus infarct. Old cystic pontine moderate
parenchymal brain volume loss, advanced for age. Mildly narrowed
callosal angle and effaced cerebral spinal fluid space at convexity.
No midline shift, mass effect or masses. No abnormal extra-axial
fluid collections.

VASCULAR: Normal major intracranial vascular flow voids present at
skull base.

SKULL AND UPPER CERVICAL SPINE: No abnormal sellar expansion. No
suspicious calvarial bone marrow signal. Craniocervical junction
maintained.

SINUSES/ORBITS: Lobulated paranasal sinus mucosal thickening without
air-fluid levels. Mastoid air cells are well aerated. Thisthe
included ocular globes and orbital contents are non-suspicious.

OTHER: None.

MRA HEAD FINDINGS

ANTERIOR CIRCULATION: Flow related enhancement of the included
cervical, petrous, cavernous and supraclinoid internal carotid
arteries with dolichoectasia. Patent anterior communicating artery.
Patent anterior and middle cerebral arteries.

No large vessel occlusion, flow limiting stenosis, aneurysm.

POSTERIOR CIRCULATION: LEFT vertebral artery is dominant. Basilar
artery is patent, with normal flow related enhancement of the main
branch vessels. Dolichoectatic posterior circulation. Patent
posterior cerebral arteries.

No large vessel occlusion, flow limiting stenosis,  aneurysm.

ANATOMIC VARIANTS: None.

Source images and MIP images were reviewed.
IMPRESSION: MRI HEAD:

1. Acute nonhemorrhagic small RIGHT basal ganglia to corona radiata
infarct. Old RIGHT thalamus lacunar infarct.
2. Old RIGHT basal ganglia infarct. Old pontine and cerebellar
infarcts.
3. Moderate to severe chronic small vessel ischemic disease.
Scattered micro hemorrhages suggesting chronic hypertension, less
likely amyloid angiopathy.
4. Moderate parenchymal brain volume loss for age, potential
component of normal pressure hydrocephalus.

MRA HEAD:

1. No emergent large vessel occlusion or flow limiting stenosis.
2. Dolichoectasia seen with chronic hypertension.

## 2019-03-30 ENCOUNTER — Ambulatory Visit: Payer: Self-pay | Admitting: Urology

## 2019-04-05 ENCOUNTER — Ambulatory Visit (INDEPENDENT_AMBULATORY_CARE_PROVIDER_SITE_OTHER): Payer: PRIVATE HEALTH INSURANCE

## 2019-04-05 ENCOUNTER — Other Ambulatory Visit: Payer: Self-pay

## 2019-04-05 DIAGNOSIS — Z23 Encounter for immunization: Secondary | ICD-10-CM

## 2019-04-06 ENCOUNTER — Other Ambulatory Visit: Payer: Self-pay | Admitting: Student

## 2019-04-06 ENCOUNTER — Ambulatory Visit (INDEPENDENT_AMBULATORY_CARE_PROVIDER_SITE_OTHER): Payer: PRIVATE HEALTH INSURANCE | Admitting: Urology

## 2019-04-06 ENCOUNTER — Encounter: Payer: Self-pay | Admitting: Urology

## 2019-04-06 VITALS — BP 143/94 | HR 89 | Ht 69.0 in | Wt 327.0 lb

## 2019-04-06 DIAGNOSIS — M4802 Spinal stenosis, cervical region: Secondary | ICD-10-CM

## 2019-04-06 DIAGNOSIS — N5089 Other specified disorders of the male genital organs: Secondary | ICD-10-CM

## 2019-04-06 DIAGNOSIS — M542 Cervicalgia: Secondary | ICD-10-CM

## 2019-04-06 DIAGNOSIS — R292 Abnormal reflex: Secondary | ICD-10-CM

## 2019-04-06 LAB — URINALYSIS, COMPLETE
Bilirubin, UA: NEGATIVE
Glucose, UA: NEGATIVE
Ketones, UA: NEGATIVE
Leukocytes,UA: NEGATIVE
Nitrite, UA: NEGATIVE
Protein,UA: NEGATIVE
RBC, UA: NEGATIVE
Specific Gravity, UA: 1.02 (ref 1.005–1.030)
Urobilinogen, Ur: 0.2 mg/dL (ref 0.2–1.0)
pH, UA: 5.5 (ref 5.0–7.5)

## 2019-04-06 LAB — MICROSCOPIC EXAMINATION
Bacteria, UA: NONE SEEN
Epithelial Cells (non renal): NONE SEEN /hpf (ref 0–10)
RBC, Urine: NONE SEEN /hpf (ref 0–2)
WBC, UA: NONE SEEN /hpf (ref 0–5)
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
## 2019-04-07 ENCOUNTER — Telehealth: Payer: Self-pay

## 2019-04-07 LAB — LACTATE DEHYDROGENASE: LDH: 197 IU/L (ref 121–224)

## 2019-04-07 LAB — AFP TUMOR MARKER: AFP, Serum, Tumor Marker: 1.1 ng/mL (ref 0.0–8.3)

## 2019-04-07 LAB — BETA HCG QUANT (REF LAB): hCG Quant: <1 m[IU]/mL (ref 0–3)

## 2019-04-07 NOTE — Telephone Encounter (Signed)
-----   Message from Billey Co, MD sent at 04/07/2019  8:18 AM EDT ----- Tumor markers all negative, great news. Keep follow up for scrotal ultrasound  Nickolas Madrid, MD 04/07/2019

## 2019-04-07 NOTE — Telephone Encounter (Signed)
Called pt no answer. Left message for pt informing him of the information below.

## 2019-04-15 ENCOUNTER — Other Ambulatory Visit: Payer: Self-pay

## 2019-04-15 ENCOUNTER — Other Ambulatory Visit: Payer: Self-pay | Admitting: Student

## 2019-04-15 ENCOUNTER — Other Ambulatory Visit
Admission: RE | Admit: 2019-04-15 | Discharge: 2019-04-15 | Disposition: A | Discharge status: Home / Self Care | Payer: PRIVATE HEALTH INSURANCE | Source: Ambulatory Visit | Attending: Internal Medicine | Admitting: Internal Medicine

## 2019-04-15 DIAGNOSIS — Z01812 Encounter for preprocedural laboratory examination: Secondary | ICD-10-CM | POA: Diagnosis present

## 2019-04-15 DIAGNOSIS — R292 Abnormal reflex: Secondary | ICD-10-CM

## 2019-04-15 DIAGNOSIS — Z20828 Contact with and (suspected) exposure to other viral communicable diseases: Secondary | ICD-10-CM | POA: Insufficient documentation

## 2019-04-15 DIAGNOSIS — M542 Cervicalgia: Secondary | ICD-10-CM

## 2019-04-16 LAB — SARS CORONAVIRUS 2 (TAT 6-24 HRS): SARS Coronavirus 2: NEGATIVE

## 2019-04-19 ENCOUNTER — Encounter: Payer: Self-pay | Admitting: *Deleted

## 2019-04-20 ENCOUNTER — Encounter
Admission: RE | Disposition: A | Discharge status: Home / Self Care | Payer: Self-pay | Source: Home / Self Care | Attending: Internal Medicine

## 2019-04-20 ENCOUNTER — Encounter: Payer: Self-pay | Admitting: *Deleted

## 2019-04-20 ENCOUNTER — Ambulatory Visit: Payer: PRIVATE HEALTH INSURANCE | Admitting: Certified Registered Nurse Anesthetist

## 2019-04-20 ENCOUNTER — Ambulatory Visit
Admission: RE | Admit: 2019-04-20 | Discharge: 2019-04-20 | Disposition: A | Discharge status: Home / Self Care | Payer: PRIVATE HEALTH INSURANCE | Attending: Internal Medicine | Admitting: Internal Medicine

## 2019-04-20 ENCOUNTER — Other Ambulatory Visit: Payer: Self-pay

## 2019-04-20 DIAGNOSIS — I129 Hypertensive chronic kidney disease with stage 1 through stage 4 chronic kidney disease, or unspecified chronic kidney disease: Secondary | ICD-10-CM | POA: Insufficient documentation

## 2019-04-20 DIAGNOSIS — R12 Heartburn: Secondary | ICD-10-CM | POA: Diagnosis not present

## 2019-04-20 DIAGNOSIS — Z1211 Encounter for screening for malignant neoplasm of colon: Secondary | ICD-10-CM | POA: Diagnosis not present

## 2019-04-20 DIAGNOSIS — F419 Anxiety disorder, unspecified: Secondary | ICD-10-CM | POA: Insufficient documentation

## 2019-04-20 DIAGNOSIS — E785 Hyperlipidemia, unspecified: Secondary | ICD-10-CM | POA: Insufficient documentation

## 2019-04-20 DIAGNOSIS — F329 Major depressive disorder, single episode, unspecified: Secondary | ICD-10-CM | POA: Diagnosis not present

## 2019-04-20 DIAGNOSIS — Z85528 Personal history of other malignant neoplasm of kidney: Secondary | ICD-10-CM | POA: Diagnosis not present

## 2019-04-20 DIAGNOSIS — G4730 Sleep apnea, unspecified: Secondary | ICD-10-CM | POA: Insufficient documentation

## 2019-04-20 DIAGNOSIS — R933 Abnormal findings on diagnostic imaging of other parts of digestive tract: Secondary | ICD-10-CM | POA: Diagnosis not present

## 2019-04-20 DIAGNOSIS — Z79899 Other long term (current) drug therapy: Secondary | ICD-10-CM | POA: Insufficient documentation

## 2019-04-20 DIAGNOSIS — F0390 Unspecified dementia without behavioral disturbance: Secondary | ICD-10-CM | POA: Insufficient documentation

## 2019-04-20 DIAGNOSIS — N183 Chronic kidney disease, stage 3 (moderate): Secondary | ICD-10-CM | POA: Diagnosis not present

## 2019-04-20 DIAGNOSIS — F4310 Post-traumatic stress disorder, unspecified: Secondary | ICD-10-CM | POA: Insufficient documentation

## 2019-04-20 DIAGNOSIS — I69354 Hemiplegia and hemiparesis following cerebral infarction affecting left non-dominant side: Secondary | ICD-10-CM | POA: Insufficient documentation

## 2019-04-20 DIAGNOSIS — R1314 Dysphagia, pharyngoesophageal phase: Secondary | ICD-10-CM | POA: Diagnosis present

## 2019-04-20 DIAGNOSIS — Z6841 Body Mass Index (BMI) 40.0 and over, adult: Secondary | ICD-10-CM | POA: Insufficient documentation

## 2019-04-20 DIAGNOSIS — K640 First degree hemorrhoids: Secondary | ICD-10-CM | POA: Insufficient documentation

## 2019-04-20 DIAGNOSIS — E669 Obesity, unspecified: Secondary | ICD-10-CM | POA: Diagnosis not present

## 2019-04-20 DIAGNOSIS — Z87891 Personal history of nicotine dependence: Secondary | ICD-10-CM | POA: Insufficient documentation

## 2019-04-20 DIAGNOSIS — K222 Esophageal obstruction: Secondary | ICD-10-CM | POA: Insufficient documentation

## 2019-04-20 DIAGNOSIS — Z7982 Long term (current) use of aspirin: Secondary | ICD-10-CM | POA: Diagnosis not present

## 2019-04-20 HISTORY — DX: Sleep apnea, unspecified: G47.30

## 2019-04-20 HISTORY — PX: COLONOSCOPY WITH PROPOFOL: SHX5780

## 2019-04-20 HISTORY — PX: ESOPHAGOGASTRODUODENOSCOPY: SHX5428

## 2019-04-20 SURGERY — EGD (ESOPHAGOGASTRODUODENOSCOPY)
Anesthesia: General

## 2019-04-20 MED ORDER — ESMOLOL HCL 100 MG/10ML IV SOLN
INTRAVENOUS | Status: DC | PRN
  Administered 2019-04-20: 20 mg via INTRAVENOUS

## 2019-04-20 MED ORDER — ONDANSETRON HCL 4 MG/2ML IJ SOLN
INTRAMUSCULAR | Status: DC | PRN
  Administered 2019-04-20: 4 mg via INTRAVENOUS

## 2019-04-20 MED ORDER — LABETALOL HCL 5 MG/ML IV SOLN
INTRAVENOUS | Status: DC | PRN
  Administered 2019-04-20: 10 mg via INTRAVENOUS

## 2019-04-20 MED ORDER — FENTANYL CITRATE (PF) 100 MCG/2ML IJ SOLN
INTRAMUSCULAR | Status: DC | PRN
  Administered 2019-04-20: 50 ug via INTRAVENOUS

## 2019-04-20 MED ORDER — MIDAZOLAM HCL 2 MG/2ML IJ SOLN
INTRAMUSCULAR | Status: DC | PRN
  Administered 2019-04-20: 2 mg via INTRAVENOUS

## 2019-04-20 MED ORDER — SODIUM CHLORIDE 0.9 % IV SOLN
INTRAVENOUS | Status: DC
  Administered 2019-04-20: 14:00:00 via INTRAVENOUS

## 2019-04-20 MED ORDER — FENTANYL CITRATE (PF) 100 MCG/2ML IJ SOLN
INTRAMUSCULAR | Status: AC
  Filled 2019-04-20: qty 2

## 2019-04-20 MED ORDER — MIDAZOLAM HCL 2 MG/2ML IJ SOLN
INTRAMUSCULAR | Status: AC
  Filled 2019-04-20: qty 2

## 2019-04-20 MED ORDER — ESMOLOL HCL 100 MG/10ML IV SOLN
INTRAVENOUS | Status: AC
  Filled 2019-04-20: qty 10

## 2019-04-20 MED ORDER — LIDOCAINE HCL (PF) 2 % IJ SOLN
INTRAMUSCULAR | Status: AC
  Filled 2019-04-20: qty 10

## 2019-04-20 MED ORDER — PROPOFOL 10 MG/ML IV BOLUS
INTRAVENOUS | Status: DC | PRN
  Administered 2019-04-20: 20 mg via INTRAVENOUS
  Administered 2019-04-20: 50 mg via INTRAVENOUS
  Administered 2019-04-20 (×2): 30 mg via INTRAVENOUS

## 2019-04-20 MED ORDER — LABETALOL HCL 5 MG/ML IV SOLN
INTRAVENOUS | Status: AC
  Filled 2019-04-20: qty 4

## 2019-04-20 MED ORDER — PROPOFOL 10 MG/ML IV BOLUS
INTRAVENOUS | Status: AC
  Filled 2019-04-20: qty 20

## 2019-04-20 NOTE — Anesthesia Preprocedure Evaluation (Signed)
Anesthesia Evaluation  Patient identified by MRN, date of birth, ID band Patient awake    Reviewed: Allergy & Precautions, NPO status , Patient's Chart, lab work & pertinent test results  History of Anesthesia Complications (+) PONV, DIFFICULT AIRWAY and history of anesthetic complications  Airway Mallampati: II  TM Distance: >3 FB Neck ROM: Full    Dental  (+) Poor Dentition   Pulmonary sleep apnea , neg COPD, former smoker,    breath sounds clear to auscultation- rhonchi (-) wheezing      Cardiovascular hypertension, Pt. on medications (-) CAD, (-) Past MI, (-) Cardiac Stents and (-) CABG  Rhythm:Regular Rate:Normal - Systolic murmurs and - Diastolic murmurs    Neuro/Psych neg Seizures PSYCHIATRIC DISORDERS Anxiety Depression CVA (L sided weakness), Residual Symptoms    GI/Hepatic negative GI ROS, Neg liver ROS,   Endo/Other  negative endocrine ROSneg diabetes  Renal/GU Renal InsufficiencyRenal disease (s/p nephrectomhy for renal cancer)     Musculoskeletal  (+) Arthritis ,   Abdominal (+) + obese,   Peds  Hematology negative hematology ROS (+)   Anesthesia Other Findings Past Medical History: No date: Anxiety No date: Arthritis No date: Cancer of kidney ([place])     Comment:  right renal carcinoma (nephrectomy ) No date: Chronic kidney disease     Comment:  stage 3  No date: Degenerative disc disease, cervical No date: Degenerative disc disease, lumbar No date: Dementia ([place]) No date: Depression No date: Difficult intubation     Comment:  no problems 01/17/12-stated "small esophagus" 1983 noted: Heart murmur     Comment:  no symptoms No date: Hyperlipidemia No date: Hypertension No date: Nephrolithiasis No date: PONV (postoperative nausea and vomiting) No date: PTSD (post-traumatic stress disorder) No date: Sleep apnea     Comment:  sleeps in fowler position. No CPAP at present No date: Stroke [facility])  Comment:  09/2017    Reproductive/Obstetrics                             Anesthesia Physical Anesthesia Plan  ASA: III  Anesthesia Plan: General   Post-op Pain Management:    Induction: Intravenous  PONV Risk Score and Plan: 2 and Propofol infusion  Airway Management Planned: Natural Airway  Additional Equipment:   Intra-op Plan:   Post-operative Plan:   Informed Consent: I have reviewed the patients History and Physical, chart, labs and discussed the procedure including the risks, benefits and alternatives for the proposed anesthesia with the patient or authorized representative who has indicated his/her understanding and acceptance.     Dental advisory given  Plan Discussed with: CRNA and Anesthesiologist  Anesthesia Plan Comments:         Anesthesia Quick Evaluation

## 2019-04-20 NOTE — Op Note (Signed)
[facility] Gastroenterology Patient Name: Jake Brown Procedure Date: 04/20/2019 3:08 PM MRN: [id number] Account #: [id number] Date of Birth: Oct 06, 1965 Admit Type: Outpatient Age: 53 Room: [facility] ENDO ROOM 3 Gender: Male Note Status: Finalized Procedure:            Colonoscopy Indications:          Screening for colorectal malignant neoplasm Providers:            Benay Pike. Alice Reichert MD, MD Referring MD:         Nino Glow Mclean-Scocuzza MD, MD (Referring MD) Medicines:            Propofol per Anesthesia Complications:        No immediate complications. Procedure:            Pre-Anesthesia Assessment:                       - The risks and benefits of the procedure and the                        sedation options and risks were discussed with the                        patient. All questions were answered and informed                        consent was obtained.                       - Patient identification and proposed procedure were                        verified prior to the procedure by the nurse. The                        procedure was verified in the procedure room.                       - ASA Grade Assessment: III - A patient with severe                        systemic disease.                       - After reviewing the risks and benefits, the patient                        was deemed in satisfactory condition to undergo the                        procedure.                       After obtaining informed consent, the colonoscope was                        passed under direct vision. Throughout the procedure,                        the patient's blood pressure, pulse, and oxygen  saturations were monitored continuously. The                        Colonoscope was introduced through the anus with the                        intention of advancing to the cecum. The scope was                        advanced to the sigmoid colon before  the procedure was                        aborted. Medications were given. The colonoscopy was                        extremely difficult due to poor bowel prep with stool                        present. Successful completion of the procedure was                        aided by performing the maneuvers documented (below) in                        this report. The colonoscopy was aborted. Findings:      The perianal and digital rectal examinations were normal. Pertinent       negatives include normal sphincter tone and no palpable rectal lesions.      Copious quantities of solid stool was found in the recto-sigmoid colon,       precluding visualization. Lavage of the area was performed using a       moderate amount of tap water, resulting in incomplete clearance with       continued poor visualization.      Procedure aborted due to poor prep. Examination not performed. Estimated       blood loss: none.      Non-bleeding internal hemorrhoids were found during retroflexion. The       hemorrhoids were Grade I (internal hemorrhoids that do not prolapse). Impression:           - The procedure was aborted.                       - Stool in the recto-sigmoid colon.                       - Non-bleeding internal hemorrhoids.                       - No specimens collected.                       - The procedure was aborted. Recommendation:       - Repeat colonoscopy at the next available appointment                        for screening purposes.                       - Miralax 1 capful (17 grams) in 8 ounces of water PO  BID.                       - Return to physician assistant in 3 weeks.                       - Reschedule colonoscopy after [facility] GI provider followup                        given obvious issues with prepping and ensure normal                        bowel habits prior to rescheduling colonoscopy.                       - The findings and recommendations were  discussed with                        the patient. Procedure Code(s):    --- Professional ---                       [id number], 53, Colorectal cancer screening; colonoscopy on                        individual not meeting criteria for high risk Diagnosis Code(s):    --- Professional ---                       K64.0, First degree hemorrhoids                       Z53.8, Procedure and treatment not carried out for                        other reasons                       Z12.11, Encounter for screening for malignant neoplasm                        of colon CPT copyright 2019 American Medical Association. All rights reserved. The codes documented in this report are preliminary and upon coder review may  be revised to meet current compliance requirements. Efrain Sella MD, MD 04/20/2019 3:36:55 PM This report has been signed electronically. Number of Addenda: 0 Note Initiated On: 04/20/2019 3:08 PM Total Procedure Duration: 0 hours 1 minute 20 seconds  Estimated Blood Loss: Estimated blood loss: none.      [facility]

## 2019-04-20 NOTE — Anesthesia Post-op Follow-up Note (Signed)
Anesthesia QCDR form completed.        

## 2019-04-20 NOTE — Transfer of Care (Signed)
Immediate Anesthesia Transfer of Care Note  Patient: Jake Brown  Procedure(s) Performed: ESOPHAGOGASTRODUODENOSCOPY (EGD) (N/A ) COLONOSCOPY WITH PROPOFOL (N/A )  Patient Location: PACU and Endoscopy Unit  Anesthesia Type:General  Level of Consciousness: awake, oriented and patient cooperative  Airway & Oxygen Therapy: Patient Spontanous Breathing  Post-op Assessment: Report given to RN, Post -op Vital signs reviewed and stable and Patient moving all extremities  Post vital signs: Reviewed and stable  Last Vitals:  Vitals Value Taken Time  BP 140/88 04/20/19 1537  Temp 36.2 C 04/20/19 1535  Pulse 81 04/20/19 1541  Resp 19 04/20/19 1541  SpO2 95 % 04/20/19 1541  Vitals shown include unvalidated device data.  Last Pain:  Vitals:   04/20/19 1535  TempSrc: Tympanic  PainSc: 0-No pain         Complications: No apparent anesthesia complications

## 2019-04-20 NOTE — H&P (Signed)
Outpatient short stay form Pre-procedure 04/20/2019 2:25 PM Yaslin Kirtley K. Alice Reichert, M.D.  Primary Physician: Orland Mustard, M.D.  Reason for visit:  Dysphagia, GERD, colon cancer screening  History of present illness:  53 y/o male presents with pharyngoesophageal dysphagia. Has hx of left sided hemiparesis after stroke. No weight loss. He is obese. Patient presents for colonoscopy for colon cancer screening. The patient denies complaints of abdominal pain, significant change in bowel habits, or rectal bleeding.     Current Facility-Administered Medications:  .  0.9 %  sodium chloride infusion, , Intravenous, Continuous, [place], Benay Pike, MD, Last Rate: 20 mL/hr at 04/20/19 1422  Medications Prior to Admission  Medication Sig Dispense Refill Last Dose  . amLODipine (NORVASC) 5 MG tablet Take 1 tablet (5 mg total) by mouth daily. 90 tablet 3 Past Week at Unknown time  . aspirin EC 81 MG tablet Take 1 tablet (81 mg total) by mouth daily.   Past Week at Unknown time  . atorvastatin (LIPITOR) 40 MG tablet Take 1 tablet (40 mg total) by mouth daily at 6 PM. 90 tablet 3 Past Week at Unknown time  . buPROPion (WELLBUTRIN XL) 300 MG 24 hr tablet Take 1 tablet (300 mg total) by mouth daily. 30 tablet 11 Past Week at Unknown time  . carvedilol (COREG) 12.5 MG tablet Take 1 tablet (12.5 mg total) by mouth 2 (two) times daily with a meal. 180 tablet 3 Past Week at Unknown time  . Cholecalciferol 1.25 MG (50000 UT) TABS Take by mouth.   Past Week at Unknown time  . diazepam (VALIUM) 5 MG tablet Take 1 tablet (5 mg total) by mouth as needed for anxiety. 15 minutes before MRI may repeat if needed x 1 2 tablet 0 Past Month at Unknown time  . fentaNYL (DURAGESIC) 25 MCG/HR Place 25 mcg onto the skin every 3 (three) days.    04/18/2019 at Unknown time  . lisinopril (ZESTRIL) 10 MG tablet Take 1 tablet (10 mg total) by mouth daily. 90 tablet 3 Past Week at Unknown time  . oxyCODONE-acetaminophen (PERCOCET)  10-325 MG tablet Take 1 tablet by mouth every 6 (six) hours as needed for pain.   Past Week at Unknown time  . sertraline (ZOLOFT) 100 MG tablet Take 1 tablet (100 mg total) by mouth daily. 90 tablet 3 Past Week at Unknown time     No Known Allergies   Past Medical History:  Diagnosis Date  . Anxiety   . Arthritis   . Cancer of kidney [facility])    right renal carcinoma (nephrectomy )  . Chronic kidney disease    stage 3   . Degenerative disc disease, cervical   . Degenerative disc disease, lumbar   . Dementia ([place])   . Depression   . Difficult intubation    no problems 01/17/12-stated "small esophagus"  . Heart murmur 1983 noted   no symptoms  . Hyperlipidemia   . Hypertension   . Nephrolithiasis   . PONV (postoperative nausea and vomiting)   . PTSD (post-traumatic stress disorder)   . Sleep apnea    sleeps in fowler position. No CPAP at present  . Stroke [facility])    09/2017     Review of systems:  Otherwise negative.    Physical Exam  Gen: Alert, oriented. Appears stated age.  HEENT: [place]/AT. PERRLA. Lungs: CTA, no wheezes. CV: RR nl S1, S2. Abd: soft, benign, no masses. BS+ Ext: No edema. Pulses 2+    Planned procedures: Proceed  with EGD and colonoscopy. The patient understands the nature of the planned procedure, indications, risks, alternatives and potential complications including but not limited to bleeding, infection, perforation, damage to internal organs and possible oversedation/side effects from anesthesia. The patient agrees and gives consent to proceed.  Please refer to procedure notes for findings, recommendations and patient disposition/instructions.     Melayah Skorupski K. Alice Reichert, M.D. Gastroenterology 04/20/2019  2:25 PM

## 2019-04-20 NOTE — Op Note (Signed)
[facility] Gastroenterology Patient Name: Jake Brown Procedure Date: 04/20/2019 3:09 PM MRN: [id number] Account #: [id number] Date of Birth: 1966-05-03 Admit Type: Outpatient Age: 53 Room: [facility] ENDO ROOM 3 Gender: Male Note Status: Finalized Procedure:            Upper GI endoscopy Indications:          Esophageal dysphagia, Heartburn, Suspected reflux                        esophagitis, Abnormal UGI series Providers:            Benay Pike. Alice Reichert MD, MD Referring MD:         Nino Glow Mclean-Scocuzza MD, MD (Referring MD) Medicines:            Propofol per Anesthesia Complications:        No immediate complications. Procedure:            Pre-Anesthesia Assessment:                       - The risks and benefits of the procedure and the                        sedation options and risks were discussed with the                        patient. All questions were answered and informed                        consent was obtained.                       - Patient identification and proposed procedure were                        verified prior to the procedure by the nurse. The                        procedure was verified in the procedure room.                       - ASA Grade Assessment: III - A patient with severe                        systemic disease.                       - After reviewing the risks and benefits, the patient                        was deemed in satisfactory condition to undergo the                        procedure.                       After obtaining informed consent, the endoscope was                        passed under direct vision. Throughout the procedure,  the patient's blood pressure, pulse, and oxygen                        saturations were monitored continuously. The Endoscope                        was introduced through the mouth, and advanced to the                        third part of duodenum. The upper  GI endoscopy was                        accomplished without difficulty. The patient tolerated                        the procedure well. Findings:      One benign-appearing, intrinsic mild (non-circumferential scarring)       stenosis was found at the gastroesophageal junction. This stenosis       measured 1.3 cm (inner diameter) x less than one cm (in length). The       stenosis was traversed. The scope was withdrawn. Dilation was performed       with a Maloney dilator with no resistance at 7 Fr.      The stomach was normal.      The examined duodenum was normal.      The exam was otherwise without abnormality. Impression:           - Benign-appearing esophageal stenosis. Dilated.                       - Normal stomach.                       - Normal examined duodenum.                       - The examination was otherwise normal.                       - No specimens collected. Recommendation:       - Monitor results to esophageal dilation                       - Proceed with colonoscopy Procedure Code(s):    --- Professional ---                       [phone], Esophagogastroduodenoscopy, flexible, transoral;                        diagnostic, including collection of specimen(s) by                        brushing or washing, when performed (separate procedure)                       43450, Dilation of esophagus, by unguided sound or                        bougie, single or multiple passes Diagnosis Code(s):    --- Professional ---  R93.3, Abnormal findings on diagnostic imaging of other                        parts of digestive tract                       R12, Heartburn                       R13.14, Dysphagia, pharyngoesophageal phase                       K22.2, Esophageal obstruction CPT copyright 2019 American Medical Association. All rights reserved. The codes documented in this report are preliminary and upon coder review may  be revised to meet current  compliance requirements. Efrain Sella MD, MD 04/20/2019 3:27:13 PM This report has been signed electronically. Number of Addenda: 0 Note Initiated On: 04/20/2019 3:09 PM Estimated Blood Loss: Estimated blood loss: none.      [facility]

## 2019-04-20 NOTE — Interval H&P Note (Signed)
History and Physical Interval Note:  04/20/2019 2:27 PM  Jake Brown  has presented today for surgery, with the diagnosis of [place].  The various methods of treatment have been discussed with the patient and family. After consideration of risks, benefits and other options for treatment, the patient has consented to  Procedure(s): ESOPHAGOGASTRODUODENOSCOPY (EGD) (N/A) COLONOSCOPY WITH PROPOFOL (N/A) as a surgical intervention.  The patient's history has been reviewed, patient examined, no change in status, stable for surgery.  I have reviewed the patient's chart and labs.  Questions were answered to the patient's satisfaction.     [place], [place]

## 2019-04-21 NOTE — Anesthesia Postprocedure Evaluation (Signed)
Anesthesia Post Note  Patient: Jake Brown  Procedure(s) Performed: ESOPHAGOGASTRODUODENOSCOPY (EGD) (N/A ) COLONOSCOPY WITH PROPOFOL (N/A )  Patient location during evaluation: PACU Anesthesia Type: General Level of consciousness: awake and alert Pain management: pain level controlled Vital Signs Assessment: post-procedure vital signs reviewed and stable Respiratory status: spontaneous breathing, nonlabored ventilation and respiratory function stable Cardiovascular status: blood pressure returned to baseline and stable Postop Assessment: no apparent nausea or vomiting Anesthetic complications: no     Last Vitals:  Vitals:   04/20/19 1545 04/20/19 1555  BP: (!) 144/96 (!) 149/105  Pulse: 82 81  Resp: (!) 22 17  Temp:    SpO2: 95% 97%    Last Pain:  Vitals:   04/21/19 0735  TempSrc:   PainSc: 0-No pain                 Durenda Hurt

## 2019-04-22 ENCOUNTER — Encounter: Payer: Self-pay | Admitting: Internal Medicine

## 2019-04-27 ENCOUNTER — Other Ambulatory Visit: Payer: Self-pay | Admitting: Urology

## 2019-04-27 ENCOUNTER — Other Ambulatory Visit: Payer: Self-pay

## 2019-04-27 ENCOUNTER — Ambulatory Visit
Admission: RE | Admit: 2019-04-27 | Discharge: 2019-04-27 | Disposition: A | Discharge status: Home / Self Care | Payer: PRIVATE HEALTH INSURANCE | Source: Ambulatory Visit | Attending: Urology | Admitting: Urology

## 2019-04-27 ENCOUNTER — Ambulatory Visit: Payer: PRIVATE HEALTH INSURANCE

## 2019-04-27 DIAGNOSIS — N5089 Other specified disorders of the male genital organs: Secondary | ICD-10-CM | POA: Insufficient documentation

## 2019-04-28 ENCOUNTER — Telehealth: Payer: Self-pay

## 2019-04-28 NOTE — Telephone Encounter (Signed)
-----   Message from Billey Co, MD sent at 04/28/2019 12:53 PM EDT ----- Small testicular cyst is unchanged in size and still benign appearing, no need for testicle removal. Please have him follow up in 6 months for symptom check, thanks  Nickolas Madrid, MD 04/28/2019

## 2019-04-28 NOTE — Telephone Encounter (Signed)
Patient notified and apt made

## 2019-04-29 ENCOUNTER — Other Ambulatory Visit: Payer: Self-pay | Admitting: Internal Medicine

## 2019-04-29 DIAGNOSIS — F32A Depression, unspecified: Secondary | ICD-10-CM

## 2019-04-29 DIAGNOSIS — F329 Major depressive disorder, single episode, unspecified: Secondary | ICD-10-CM

## 2019-04-29 DIAGNOSIS — I10 Essential (primary) hypertension: Secondary | ICD-10-CM

## 2019-04-29 MED ORDER — CARVEDILOL 12.5 MG PO TABS: 12.5000 mg | ORAL_TABLET | Freq: Two times a day (BID) | ORAL | 3 refills | Status: DC

## 2019-04-29 MED ORDER — BUPROPION HCL ER (XL) 300 MG PO TB24: 300.0000 mg | ORAL_TABLET | Freq: Every day | ORAL | 11 refills | Status: DC

## 2019-05-04 ENCOUNTER — Encounter: Payer: Self-pay | Admitting: Internal Medicine

## 2019-05-04 ENCOUNTER — Other Ambulatory Visit: Payer: Self-pay

## 2019-05-04 ENCOUNTER — Ambulatory Visit (INDEPENDENT_AMBULATORY_CARE_PROVIDER_SITE_OTHER): Payer: PRIVATE HEALTH INSURANCE | Admitting: Internal Medicine

## 2019-05-04 VITALS — BP 166/106 | HR 113 | Temp 98.0°F | Ht 69.0 in | Wt 302.4 lb

## 2019-05-04 DIAGNOSIS — R7303 Prediabetes: Secondary | ICD-10-CM | POA: Diagnosis not present

## 2019-05-04 DIAGNOSIS — F32A Depression, unspecified: Secondary | ICD-10-CM

## 2019-05-04 DIAGNOSIS — E559 Vitamin D deficiency, unspecified: Secondary | ICD-10-CM

## 2019-05-04 DIAGNOSIS — M5440 Lumbago with sciatica, unspecified side: Secondary | ICD-10-CM

## 2019-05-04 DIAGNOSIS — Z0000 Encounter for general adult medical examination without abnormal findings: Secondary | ICD-10-CM | POA: Insufficient documentation

## 2019-05-04 DIAGNOSIS — M533 Sacrococcygeal disorders, not elsewhere classified: Secondary | ICD-10-CM

## 2019-05-04 DIAGNOSIS — F329 Major depressive disorder, single episode, unspecified: Secondary | ICD-10-CM

## 2019-05-04 DIAGNOSIS — F419 Anxiety disorder, unspecified: Secondary | ICD-10-CM

## 2019-05-04 DIAGNOSIS — I10 Essential (primary) hypertension: Secondary | ICD-10-CM | POA: Diagnosis not present

## 2019-05-04 DIAGNOSIS — M542 Cervicalgia: Secondary | ICD-10-CM

## 2019-05-04 DIAGNOSIS — G8929 Other chronic pain: Secondary | ICD-10-CM

## 2019-05-04 DIAGNOSIS — Z1283 Encounter for screening for malignant neoplasm of skin: Secondary | ICD-10-CM

## 2019-05-04 HISTORY — DX: Other chronic pain: G89.29

## 2019-05-04 MED ORDER — SERTRALINE HCL 100 MG PO TABS: 100.0000 mg | ORAL_TABLET | Freq: Every day | ORAL | 3 refills | Status: DC

## 2019-05-04 MED ORDER — AMLODIPINE BESYLATE 5 MG PO TABS: 5.0000 mg | ORAL_TABLET | Freq: Every day | ORAL | 3 refills | Status: DC

## 2019-05-04 NOTE — Progress Notes (Signed)
The note has been faxed electronically.

## 2019-05-04 NOTE — Progress Notes (Signed)
Have called pharmacy and gave verbal.
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
## 2019-05-04 NOTE — Patient Instructions (Addendum)
Consider Dr. Sharlet Salina PM&R for steroid injections to neck/back  Call to reschedule   Gerrit Heck, [address]  [place], [place] [address]  [phone]  [phone] (Fax)  Procedure Date 9/30   Try Similasan complete eye drops for dry, red, itchy eyes

## 2019-05-05 ENCOUNTER — Other Ambulatory Visit (INDEPENDENT_AMBULATORY_CARE_PROVIDER_SITE_OTHER): Payer: PRIVATE HEALTH INSURANCE

## 2019-05-05 ENCOUNTER — Other Ambulatory Visit: Payer: Self-pay

## 2019-05-05 DIAGNOSIS — R7303 Prediabetes: Secondary | ICD-10-CM | POA: Diagnosis not present

## 2019-05-05 DIAGNOSIS — E559 Vitamin D deficiency, unspecified: Secondary | ICD-10-CM

## 2019-05-05 DIAGNOSIS — I10 Essential (primary) hypertension: Secondary | ICD-10-CM

## 2019-05-05 LAB — CBC WITH DIFFERENTIAL/PLATELET
Basophils Absolute: 0 10*3/uL (ref 0.0–0.1)
Basophils Relative: 0.4 % (ref 0.0–3.0)
Eosinophils Absolute: 0.4 10*3/uL (ref 0.0–0.7)
Eosinophils Relative: 4.5 % (ref 0.0–5.0)
HCT: 42.9 % (ref 39.0–52.0)
Hemoglobin: 14 g/dL (ref 13.0–17.0)
Lymphocytes Relative: 25.9 % (ref 12.0–46.0)
Lymphs Abs: 2.5 10*3/uL (ref 0.7–4.0)
MCHC: 32.6 g/dL (ref 30.0–36.0)
MCV: 92.4 fl (ref 78.0–100.0)
Monocytes Absolute: 0.6 10*3/uL (ref 0.1–1.0)
Monocytes Relative: 6.2 % (ref 3.0–12.0)
Neutro Abs: 6.1 10*3/uL (ref 1.4–7.7)
Neutrophils Relative %: 63 % (ref 43.0–77.0)
Platelets: 225 10*3/uL (ref 150.0–400.0)
RBC: 4.65 Mil/uL (ref 4.22–5.81)
RDW: 15.3 % (ref 11.5–15.5)
WBC: 9.7 10*3/uL (ref 4.0–10.5)

## 2019-05-05 LAB — COMPREHENSIVE METABOLIC PANEL
ALT: 22 U/L (ref 0–53)
AST: 18 U/L (ref 0–37)
Albumin: 4.1 g/dL (ref 3.5–5.2)
Alkaline Phosphatase: 106 U/L (ref 39–117)
BUN: 23 mg/dL (ref 6–23)
CO2: 28 mEq/L (ref 19–32)
Calcium: 9.3 mg/dL (ref 8.4–10.5)
Chloride: 102 mEq/L (ref 96–112)
Creatinine, Ser: 1.56 mg/dL — ABNORMAL HIGH (ref 0.40–1.50)
GFR: 46.34 mL/min — ABNORMAL LOW (ref >60.00)
Glucose, Bld: 102 mg/dL — ABNORMAL HIGH (ref 70–99)
Potassium: 4 mEq/L (ref 3.5–5.1)
Sodium: 139 mEq/L (ref 135–145)
Total Bilirubin: 0.5 mg/dL (ref 0.2–1.2)
Total Protein: 7.1 g/dL (ref 6.0–8.3)

## 2019-05-05 LAB — HEMOGLOBIN A1C: Hgb A1c MFr Bld: 6.1 % (ref 4.6–6.5)

## 2019-05-05 LAB — LIPID PANEL
Cholesterol: 138 mg/dL (ref 0–200)
HDL: 50.6 mg/dL (ref >39.00)
LDL Cholesterol: 59 mg/dL (ref 0–99)
NonHDL: 87.86
Total CHOL/HDL Ratio: 3
Triglycerides: 146 mg/dL (ref 0.0–149.0)
VLDL: 29.2 mg/dL (ref 0.0–40.0)

## 2019-05-05 LAB — VITAMIN D 25 HYDROXY (VIT D DEFICIENCY, FRACTURES): VITD: 20.22 ng/mL — ABNORMAL LOW (ref 30.00–100.00)

## 2019-05-16 ENCOUNTER — Emergency Department
Admission: EM | Admit: 2019-05-16 | Discharge: 2019-05-16 | Disposition: A | Discharge status: Home / Self Care | Payer: PRIVATE HEALTH INSURANCE | Attending: Emergency Medicine | Admitting: Emergency Medicine

## 2019-05-16 ENCOUNTER — Other Ambulatory Visit: Payer: Self-pay

## 2019-05-16 ENCOUNTER — Encounter: Payer: Self-pay | Admitting: Emergency Medicine

## 2019-05-16 DIAGNOSIS — I129 Hypertensive chronic kidney disease with stage 1 through stage 4 chronic kidney disease, or unspecified chronic kidney disease: Secondary | ICD-10-CM | POA: Insufficient documentation

## 2019-05-16 DIAGNOSIS — Z7982 Long term (current) use of aspirin: Secondary | ICD-10-CM | POA: Insufficient documentation

## 2019-05-16 DIAGNOSIS — M542 Cervicalgia: Secondary | ICD-10-CM

## 2019-05-16 DIAGNOSIS — Z87891 Personal history of nicotine dependence: Secondary | ICD-10-CM | POA: Diagnosis not present

## 2019-05-16 DIAGNOSIS — M545 Low back pain, unspecified: Secondary | ICD-10-CM

## 2019-05-16 DIAGNOSIS — Z8673 Personal history of transient ischemic attack (TIA), and cerebral infarction without residual deficits: Secondary | ICD-10-CM | POA: Insufficient documentation

## 2019-05-16 DIAGNOSIS — N183 Chronic kidney disease, stage 3 unspecified: Secondary | ICD-10-CM | POA: Insufficient documentation

## 2019-05-16 DIAGNOSIS — Z79899 Other long term (current) drug therapy: Secondary | ICD-10-CM | POA: Diagnosis not present

## 2019-05-16 DIAGNOSIS — G8929 Other chronic pain: Secondary | ICD-10-CM

## 2019-05-16 MED ORDER — PREDNISONE 10 MG PO TABS: ORAL_TABLET | ORAL | 0 refills | Status: DC

## 2019-05-16 MED ORDER — DEXAMETHASONE SODIUM PHOSPHATE 10 MG/ML IJ SOLN
10.0000 mg | Freq: Once | INTRAMUSCULAR | Status: AC
  Administered 2019-05-16: 10 mg via INTRAMUSCULAR
  Filled 2019-05-16: qty 1

## 2019-05-16 NOTE — ED Triage Notes (Signed)
Pt presents to ED via POV with c/o back pain. Pt states hx of chronic neck and back pain. Pt states order has been placed for MRI for his neck. Pt states on 10/15 stood in line for 4hrs to vote. Pt states since then back pain has been worse. Pt states called his PCP who ordered MRI of neck and was told to come to ER. Pt states has received insurance approval for MRI of neck but MRI of his back was not ordered. Pt currently drinking [employer] coffee in triage.
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
## 2019-05-16 NOTE — Discharge Instructions (Signed)
Follow-up with your provider at [facility] to schedule an MRI.  Continue your regular medications as prescribed by your doctor.  An injection of steroids was given to you while in the ED and a prescription for continued steroid taper was sent to your pharmacy.

## 2019-05-16 NOTE — ED Notes (Signed)
See triage note  Presents with lower back pain which is moving into right leg  States he developed pain about 10 days ago after standing in line for about 4 hours to vote

## 2019-06-10 ENCOUNTER — Encounter: Payer: Self-pay | Admitting: Family Medicine

## 2019-06-15 ENCOUNTER — Ambulatory Visit
Admission: RE | Admit: 2019-06-15 | Discharge: 2019-06-15 | Disposition: A | Discharge status: Home / Self Care | Payer: PRIVATE HEALTH INSURANCE | Source: Ambulatory Visit | Attending: Student | Admitting: Student

## 2019-06-15 ENCOUNTER — Other Ambulatory Visit: Payer: Self-pay

## 2019-06-15 DIAGNOSIS — R292 Abnormal reflex: Secondary | ICD-10-CM | POA: Diagnosis present

## 2019-06-15 DIAGNOSIS — M4802 Spinal stenosis, cervical region: Secondary | ICD-10-CM

## 2019-06-15 DIAGNOSIS — M542 Cervicalgia: Secondary | ICD-10-CM | POA: Insufficient documentation

## 2019-07-13 ENCOUNTER — Encounter: Payer: Self-pay | Admitting: Internal Medicine

## 2019-07-13 ENCOUNTER — Ambulatory Visit (INDEPENDENT_AMBULATORY_CARE_PROVIDER_SITE_OTHER): Payer: Self-pay | Admitting: Internal Medicine

## 2019-07-13 ENCOUNTER — Other Ambulatory Visit: Payer: Self-pay

## 2019-07-13 VITALS — BP 120/80 | Ht 69.0 in | Wt 295.0 lb

## 2019-07-13 DIAGNOSIS — F329 Major depressive disorder, single episode, unspecified: Secondary | ICD-10-CM

## 2019-07-13 DIAGNOSIS — I10 Essential (primary) hypertension: Secondary | ICD-10-CM

## 2019-07-13 DIAGNOSIS — F419 Anxiety disorder, unspecified: Secondary | ICD-10-CM

## 2019-07-13 DIAGNOSIS — F32A Depression, unspecified: Secondary | ICD-10-CM

## 2019-07-13 DIAGNOSIS — G894 Chronic pain syndrome: Secondary | ICD-10-CM
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
## 2019-07-14 NOTE — Progress Notes (Signed)
Financial Paperwork mailed to the patient.

## 2019-09-07 ENCOUNTER — Encounter: Payer: Self-pay | Admitting: Internal Medicine

## 2019-09-07 ENCOUNTER — Other Ambulatory Visit: Payer: Self-pay

## 2019-09-07 ENCOUNTER — Ambulatory Visit (INDEPENDENT_AMBULATORY_CARE_PROVIDER_SITE_OTHER): Payer: 59 | Admitting: Internal Medicine

## 2019-09-07 VITALS — Ht 69.0 in | Wt 295.0 lb

## 2019-09-07 DIAGNOSIS — F32A Depression, unspecified: Secondary | ICD-10-CM

## 2019-09-07 DIAGNOSIS — F419 Anxiety disorder, unspecified: Secondary | ICD-10-CM | POA: Diagnosis not present

## 2019-09-07 DIAGNOSIS — M25511 Pain in right shoulder: Secondary | ICD-10-CM

## 2019-09-07 DIAGNOSIS — F329 Major depressive disorder, single episode, unspecified: Secondary | ICD-10-CM | POA: Diagnosis not present

## 2019-09-07 DIAGNOSIS — G8929 Other chronic pain: Secondary | ICD-10-CM | POA: Diagnosis not present
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
## 2019-09-07 NOTE — Patient Instructions (Signed)
COVID-19 Vaccine Information can be found at: ShippingScam.co.uk For questions related to vaccine distribution or appointments, please email [email or web address] or call [phone].   COVID 19  [phone] in [place]   [phone] in [place]  [phone] in [place] or [employer]  Tumeric/Ginger/Curcumin supplement

## 2019-09-19 ENCOUNTER — Encounter: Payer: Self-pay | Admitting: Speech Pathology

## 2019-09-19 ENCOUNTER — Ambulatory Visit: Payer: 59 | Attending: Neurology | Admitting: Speech Pathology

## 2019-09-19 ENCOUNTER — Other Ambulatory Visit: Payer: Self-pay

## 2019-09-19 ENCOUNTER — Ambulatory Visit
Admission: RE | Admit: 2019-09-19 | Discharge: 2019-09-19 | Disposition: A | Discharge status: Home / Self Care | Payer: 59 | Attending: Internal Medicine | Admitting: Internal Medicine

## 2019-09-19 ENCOUNTER — Ambulatory Visit
Admission: RE | Admit: 2019-09-19 | Discharge: 2019-09-19 | Disposition: A | Discharge status: Home / Self Care | Payer: 59 | Source: Ambulatory Visit | Attending: Internal Medicine | Admitting: Internal Medicine

## 2019-09-19 DIAGNOSIS — G8929 Other chronic pain: Secondary | ICD-10-CM

## 2019-09-19 DIAGNOSIS — R1312 Dysphagia, oropharyngeal phase: Secondary | ICD-10-CM | POA: Insufficient documentation

## 2019-09-19 DIAGNOSIS — M25511 Pain in right shoulder: Secondary | ICD-10-CM | POA: Diagnosis present
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
## 2019-09-20 ENCOUNTER — Other Ambulatory Visit: Payer: Self-pay | Admitting: Neurology

## 2019-09-20 DIAGNOSIS — I639 Cerebral infarction, unspecified: Secondary | ICD-10-CM

## 2019-09-21 ENCOUNTER — Other Ambulatory Visit: Payer: Self-pay | Admitting: Internal Medicine

## 2019-09-21 DIAGNOSIS — M19011 Primary osteoarthritis, right shoulder: Secondary | ICD-10-CM

## 2019-09-21 DIAGNOSIS — Z8739 Personal history of other diseases of the musculoskeletal system and connective tissue: Secondary | ICD-10-CM

## 2019-09-26 ENCOUNTER — Other Ambulatory Visit: Payer: Self-pay

## 2019-09-26 ENCOUNTER — Ambulatory Visit
Admission: RE | Admit: 2019-09-26 | Discharge: 2019-09-26 | Disposition: A | Discharge status: Home / Self Care | Payer: 59 | Source: Ambulatory Visit | Attending: Neurology | Admitting: Neurology

## 2019-09-26 DIAGNOSIS — I639 Cerebral infarction, unspecified: Secondary | ICD-10-CM | POA: Diagnosis present

## 2019-10-07 ENCOUNTER — Other Ambulatory Visit: Payer: Self-pay | Admitting: Neurology

## 2019-10-07 DIAGNOSIS — R633 Feeding difficulties, unspecified: Secondary | ICD-10-CM

## 2019-10-07 DIAGNOSIS — R1310 Dysphagia, unspecified: Secondary | ICD-10-CM

## 2019-10-18 ENCOUNTER — Other Ambulatory Visit: Payer: Self-pay

## 2019-10-18 ENCOUNTER — Ambulatory Visit
Admission: RE | Admit: 2019-10-18 | Discharge: 2019-10-18 | Disposition: A | Discharge status: Home / Self Care | Payer: 59 | Source: Ambulatory Visit | Attending: Neurology | Admitting: Neurology

## 2019-10-18 DIAGNOSIS — R1310 Dysphagia, unspecified: Secondary | ICD-10-CM | POA: Insufficient documentation
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
## 2019-10-24 ENCOUNTER — Ambulatory Visit: Payer: Self-pay | Admitting: Urology

## 2019-10-26 ENCOUNTER — Ambulatory Visit: Payer: Self-pay | Admitting: Urology

## 2019-11-09 ENCOUNTER — Encounter: Payer: Self-pay | Admitting: Urology

## 2019-11-09 ENCOUNTER — Ambulatory Visit (INDEPENDENT_AMBULATORY_CARE_PROVIDER_SITE_OTHER): Payer: 59 | Admitting: Urology

## 2019-11-09 ENCOUNTER — Other Ambulatory Visit: Payer: Self-pay

## 2019-11-09 VITALS — BP 123/90 | HR 96 | Ht 69.0 in | Wt 295.0 lb

## 2019-11-09 DIAGNOSIS — N442 Benign cyst of testis: Secondary | ICD-10-CM

## 2019-11-09 NOTE — Progress Notes (Signed)
   11/09/2019 9:36 AM   Jake Brown Dec 26, 1965 [id number]  Reason for visit: Follow up right testicular cyst  HPI: I saw Jake Brown back in urology clinic for follow-up of a right testicular cyst.  He is a comorbid 54 year old male with morbid obesity, history of right RCC status post right radical nephrectomy in 2001, CKD, chronic pain on narcotics, hypertension, and history of stroke who was found to have a small 0.4 cm right intratesticular cyst on scrotal ultrasound in June 2020 with some testicular pain.  I obtained a follow-up scrotal ultrasound in October 2020 which showed no change in the size of the lesion, and it was suggestive of a benign finding.  Tumor markers were also negative.  His scrotal pain has resolved since that time.  We again reviewed his ultrasound images and the high likelihood that this is a benign finding.  Other options would be radical inguinal orchiectomy, but this would be high risk with his comorbidities and morbid obesity with I think very minimal benefit as the lesion is cystic, stable on serial ultrasounds, and most consistent with a benign asymptomatic intratesticular cyst.  Follow-up as needed  I spent 20 total minutes on the day of the encounter including pre-visit review of the medical record, face-to-face time with the patient, and post visit ordering of labs/imaging/tests.  [place], [facility] [address] [place], [place] [address] [phone]

## 2019-11-10 ENCOUNTER — Ambulatory Visit: Payer: 59 | Admitting: Urology

## 2019-11-14 ENCOUNTER — Telehealth: Payer: Self-pay | Admitting: Internal Medicine

## 2019-11-14 ENCOUNTER — Other Ambulatory Visit: Payer: Self-pay

## 2019-11-14 NOTE — Telephone Encounter (Signed)
Left message on voicemail to call office to do screening.

## 2019-11-16 ENCOUNTER — Encounter: Payer: Self-pay | Admitting: Internal Medicine

## 2019-11-16 ENCOUNTER — Other Ambulatory Visit: Payer: Self-pay

## 2019-11-16 ENCOUNTER — Ambulatory Visit (INDEPENDENT_AMBULATORY_CARE_PROVIDER_SITE_OTHER): Payer: 59 | Admitting: Internal Medicine

## 2019-11-16 ENCOUNTER — Telehealth: Payer: Self-pay | Admitting: Internal Medicine

## 2019-11-16 VITALS — BP 118/82 | HR 74 | Temp 97.4°F | Ht 69.0 in | Wt 308.8 lb

## 2019-11-16 DIAGNOSIS — F32A Depression, unspecified: Secondary | ICD-10-CM

## 2019-11-16 DIAGNOSIS — M549 Dorsalgia, unspecified: Secondary | ICD-10-CM

## 2019-11-16 DIAGNOSIS — R269 Unspecified abnormalities of gait and mobility: Secondary | ICD-10-CM | POA: Insufficient documentation

## 2019-11-16 DIAGNOSIS — G8929 Other chronic pain: Secondary | ICD-10-CM

## 2019-11-16 DIAGNOSIS — G4733 Obstructive sleep apnea (adult) (pediatric): Secondary | ICD-10-CM | POA: Diagnosis not present

## 2019-11-16 DIAGNOSIS — F329 Major depressive disorder, single episode, unspecified: Secondary | ICD-10-CM

## 2019-11-16 DIAGNOSIS — M79631 Pain in right forearm: Secondary | ICD-10-CM | POA: Diagnosis not present

## 2019-11-16 DIAGNOSIS — W19XXXD Unspecified fall, subsequent encounter: Secondary | ICD-10-CM | POA: Diagnosis not present

## 2019-11-16 DIAGNOSIS — M545 Low back pain: Secondary | ICD-10-CM

## 2019-11-16 DIAGNOSIS — M19011 Primary osteoarthritis, right shoulder: Secondary | ICD-10-CM

## 2019-11-16 DIAGNOSIS — M542 Cervicalgia: Secondary | ICD-10-CM

## 2019-11-16 DIAGNOSIS — Z9989 Dependence on other enabling machines and devices: Secondary | ICD-10-CM

## 2019-11-16 DIAGNOSIS — F419 Anxiety disorder, unspecified: Secondary | ICD-10-CM

## 2019-11-16 NOTE — Patient Instructions (Addendum)
Consider injections for back procedure/cpt code for cost purposes Dr. Ellene Route, Dr. Vertell Limber, Dr. Arnoldo Morale    Voltaren gel 4x per day  Heat patches/pad or ice  Lidocaine pain patch over the counter  Call insurance and see who is in network as far as psychiatry and let me know  -who is in network   Let me know about mid back xray and or dermatology   Talk to dentist about sleep apnea   Sleep Apnea Sleep apnea is a condition in which breathing pauses or becomes shallow during sleep. Episodes of sleep apnea usually last 10 seconds or longer, and they may occur as many as 20 times an hour. Sleep apnea disrupts your sleep and keeps your body from getting the rest that it needs. This condition can increase your risk of certain health problems, including:  Heart attack.  Stroke.  Obesity.  Diabetes.  Heart failure.  Irregular heartbeat. What are the causes? There are three kinds of sleep apnea:  Obstructive sleep apnea. This kind is caused by a blocked or collapsed airway.  Central sleep apnea. This kind happens when the part of the brain that controls breathing does not send the correct signals to the muscles that control breathing.  Mixed sleep apnea. This is a combination of obstructive and central sleep apnea. The most common cause of this condition is a collapsed or blocked airway. An airway can collapse or become blocked if:  Your throat muscles are abnormally relaxed.  Your tongue and tonsils are larger than normal.  You are overweight.  Your airway is smaller than normal. What increases the risk? You are more likely to develop this condition if you:  Are overweight.  Smoke.  Have a smaller than normal airway.  Are elderly.  Are male.  Drink alcohol.  Take sedatives or tranquilizers.  Have a family history of sleep apnea. What are the signs or symptoms? Symptoms of this condition include:  Trouble staying asleep.  Daytime sleepiness and  tiredness.  Irritability.  Loud snoring.  Morning headaches.  Trouble concentrating.  Forgetfulness.  Decreased interest in sex.  Unexplained sleepiness.  Mood swings.  Personality changes.  Feelings of depression.  Waking up often during the night to urinate.  Dry mouth.  Sore throat. How is this diagnosed? This condition may be diagnosed with:  A medical history.  A physical exam.  A series of tests that are done while you are sleeping (sleep study). These tests are usually done in a sleep lab, but they may also be done at home. How is this treated? Treatment for this condition aims to restore normal breathing and to ease symptoms during sleep. It may involve managing health issues that can affect breathing, such as high blood pressure or obesity. Treatment may include:  Sleeping on your side.  Using a decongestant if you have nasal congestion.  Avoiding the use of depressants, including alcohol, sedatives, and narcotics.  Losing weight if you are overweight.  Making changes to your diet.  Quitting smoking.  Using a device to open your airway while you sleep, such as: ? An oral appliance. This is a custom-made mouthpiece that shifts your lower jaw forward. ? A continuous positive airway pressure (CPAP) device. This device blows air through a mask when you breathe out (exhale). ? A nasal expiratory positive airway pressure (EPAP) device. This device has valves that you put into each nostril. ? A bi-level positive airway pressure (BPAP) device. This device blows air through a mask when  you breathe in (inhale) and breathe out (exhale).  Having surgery if other treatments do not work. During surgery, excess tissue is removed to create a wider airway. It is important to get treatment for sleep apnea. Without treatment, this condition can lead to:  High blood pressure.  Coronary artery disease.  In men, an inability to achieve or maintain an erection  (impotence).  Reduced thinking abilities. Follow these instructions at home: Lifestyle  Make any lifestyle changes that your health care provider recommends.  Eat a healthy, well-balanced diet.  Take steps to lose weight if you are overweight.  Avoid using depressants, including alcohol, sedatives, and narcotics.  Do not use any products that contain nicotine or tobacco, such as cigarettes, e-cigarettes, and chewing tobacco. If you need help quitting, ask your health care provider. General instructions  Take over-the-counter and prescription medicines only as told by your health care provider.  If you were given a device to open your airway while you sleep, use it only as told by your health care provider.  If you are having surgery, make sure to tell your health care provider you have sleep apnea. You may need to bring your device with you.  Keep all follow-up visits as told by your health care provider. This is important. Contact a health care provider if:  The device that you received to open your airway during sleep is uncomfortable or does not seem to be working.  Your symptoms do not improve.  Your symptoms get worse. Get help right away if:  You develop: ? Chest pain. ? Shortness of breath. ? Discomfort in your back, arms, or stomach.  You have: ? Trouble speaking. ? Weakness on one side of your body. ? Drooping in your face. These symptoms may represent a serious problem that is an emergency. Do not wait to see if the symptoms will go away. Get medical help right away. Call your local emergency services (911 in the [place] Do not drive yourself to the hospital. Summary  Sleep apnea is a condition in which breathing pauses or becomes shallow during sleep.  The most common cause is a collapsed or blocked airway.  The goal of treatment is to restore normal breathing and to ease symptoms during sleep. This information is not intended to replace advice given to you  by your health care provider. Make sure you discuss any questions you have with your health care provider. Document Revised: 12/22/2018 Document Reviewed: 03/02/2018 Elsevier Patient Education  2020 [employer].   Thoracic Strain Rehab Ask your health care provider which exercises are safe for you. Do exercises exactly as told by your health care provider and adjust them as directed. It is normal to feel mild stretching, pulling, tightness, or discomfort as you do these exercises. Stop right away if you feel sudden pain or your pain gets worse. Do not begin these exercises until told by your health care provider. Stretching and range-of-motion exercise This exercise warms up your muscles and joints and improves the movement and flexibility of your back and shoulders. This exercise also helps to relieve pain. Chest and spine stretch  1. Lie down on your back on a firm surface. 2. Roll a towel or a small blanket so it is about 4 inches (10 cm) in diameter. 3. Put the towel lengthwise under the middle of your back so it is under your spine, but not under your shoulder blades. 4. Put your hands behind your head and let your elbows fall  to your sides. This will increase your stretch. 5. Take a deep breath (inhale). 6. Hold for __________ seconds. 7. Relax after you breathe out (exhale). Repeat __________ times. Complete this exercise __________ times a day. Strengthening exercises These exercises build strength and endurance in your back and your shoulder blade muscles. Endurance is the ability to use your muscles for a long time, even after they get tired. Alternating arm and leg raises  1. Get on your hands and knees on a firm surface. If you are on a hard floor, you may want to use padding, such as an exercise mat, to cushion your knees. 2. Line up your arms and legs. Your hands should be directly below your shoulders, and your knees should be directly below your hips. 3. Lift your left leg  behind you. At the same time, raise your right arm and straighten it in front of you. ? Do not lift your leg higher than your hip. ? Do not lift your arm higher than your shoulder. ? Keep your abdominal and back muscles tight. ? Keep your hips facing the ground. ? Do not arch your back. ? Keep your balance carefully, and do not hold your breath. 4. Hold for __________ seconds. 5. Slowly return to the starting position and repeat with your right leg and your left arm. Repeat __________ times. Complete this exercise __________ times a day. Straight arm rows This exercise is also called shoulder extension exercise. 1. Stand with your feet shoulder width apart. 2. Secure an exercise band to a stable object in front of you so the band is at or above shoulder height. 3. Hold one end of the exercise band in each hand. 4. Straighten your elbows and lift your hands up to shoulder height. 5. Step back, away from the secured end of the exercise band, until the band stretches. 6. Squeeze your shoulder blades together and pull your hands down to the sides of your thighs. Stop when your hands are straight down by your sides. This is shoulder extension. Do not let your hands go behind your body. 7. Hold for __________ seconds. 8. Slowly return to the starting position. Repeat __________ times. Complete this exercise __________ times a day. Prone shoulder external rotation 1. Lie on your abdomen on a firm bed so your left / right forearm hangs over the edge of the bed and your upper arm is on the bed, straight out from your body. This is the prone position. ? Your elbow should be bent. ? Your palm should be facing your feet. 2. If instructed, hold a __________ weight in your hand. 3. Squeeze your shoulder blade toward the middle of your back. Do not let your shoulder lift toward your ear. 4. Keep your elbow bent in a 90-degree angle (right angle) while you slowly move your forearm up toward the ceiling.  Move your forearm up to the height of the bed, toward your head. This is external rotation. ? Your upper arm should not move. ? At the top of the movement, your palm should face the floor. 5. Hold for __________ seconds. 6. Slowly return to the starting position and relax your muscles. Repeat __________ times. Complete this exercise __________ times a day. Rowing scapular retraction This is an exercise in which the shoulder blades (scapulae) are pulled toward each other (retraction). 1. Sit in a stable chair without armrests, or stand up. 2. Secure an exercise band to a stable object in front of you so the band  is at shoulder height. 3. Hold one end of the exercise band in each hand. Your palms should face down. 4. Bring your arms out straight in front of you. 5. Step back, away from the secured end of the exercise band, until the band stretches. 6. Pull the band backward. As you do this, bend your elbows and squeeze your shoulder blades together, but avoid letting the rest of your body move. Do not shrug your shoulders upward while you do this. 7. Stop when your elbows are at your sides or slightly behind your body. 8. Hold for __________ seconds. 9. Slowly straighten your arms to return to the starting position. Repeat __________ times. Complete this exercise __________ times a day. Posture and body mechanics Good posture and healthy body mechanics can help to relieve stress in your body's tissues and joints. Body mechanics refers to the movements and positions of your body while you do your daily activities. Posture is part of body mechanics. Good posture means:  Your spine is in its natural S-curve position (neutral).  Your shoulders are pulled back slightly.  Your head is not tipped forward. Follow these guidelines to improve your posture and body mechanics in your everyday activities. Standing   When standing, keep your spine neutral and your feet about hip width apart. Keep a  slight bend in your knees. Your ears, shoulders, and hips should line up with each other.  When you do a task in which you lean forward while standing in one place for a long time, place one foot up on a stable object that is 2-4 inches (5-10 cm) high, such as a footstool. This helps keep your spine neutral. Sitting   When sitting, keep your spine neutral and keep your feet flat on the floor. Use a footrest, if necessary, and keep your thighs parallel to the floor. Avoid rounding your shoulders, and avoid tilting your head forward.  When working at a desk or a computer, keep your desk at a height where your hands are slightly lower than your elbows. Slide your chair under your desk so you are close enough to maintain good posture.  When working at a computer, place your monitor at a height where you are looking straight ahead and you do not have to tilt your head forward or downward to look at the screen. Resting When lying down and resting, avoid positions that are most painful for you.  If you have pain with activities such as sitting, bending, stooping, or squatting (flexion-basedactivities), lie in a position in which your body does not bend very much. For example, avoid curling up on your side with your arms and knees near your chest (fetal position).  If you have pain with activities such as standing for a long time or reaching with your arms (extension-basedactivities), lie with your spine in a neutral position and bend your knees slightly. Try the following positions: ? Lie on your side with a pillow between your knees. ? Lie on your back with a pillow under your knees.  Lifting   When lifting objects, keep your feet at least shoulder width apart and tighten your abdominal muscles.  Bend your knees and hips and keep your spine neutral. It is important to lift using the strength of your legs, not your back. Do not lock your knees straight out.  Always ask for help to lift heavy or  awkward objects. This information is not intended to replace advice given to you by your health care  provider. Make sure you discuss any questions you have with your health care provider. Document Revised: 10/29/2018 Document Reviewed: 08/16/2018 Elsevier Patient Education  [employer].  Back Exercises The following exercises strengthen the muscles that help to support the trunk and back. They also help to keep the lower back flexible. Doing these exercises can help to prevent back pain or lessen existing pain.  If you have back pain or discomfort, try doing these exercises 2-3 times each day or as told by your health care provider.  As your pain improves, do them once each day, but increase the number of times that you repeat the steps for each exercise (do more repetitions).  To prevent the recurrence of back pain, continue to do these exercises once each day or as told by your health care provider. Do exercises exactly as told by your health care provider and adjust them as directed. It is normal to feel mild stretching, pulling, tightness, or discomfort as you do these exercises, but you should stop right away if you feel sudden pain or your pain gets worse. Exercises Single knee to chest Repeat these steps 3-5 times for each leg: 1. Lie on your back on a firm bed or the floor with your legs extended. 2. Bring one knee to your chest. Your other leg should stay extended and in contact with the floor. 3. Hold your knee in place by grabbing your knee or thigh with both hands and hold. 4. Pull on your knee until you feel a gentle stretch in your lower back or buttocks. 5. Hold the stretch for 10-30 seconds. 6. Slowly release and straighten your leg. Pelvic tilt Repeat these steps 5-10 times: 1. Lie on your back on a firm bed or the floor with your legs extended. 2. Bend your knees so they are pointing toward the ceiling and your feet are flat on the floor. 3. Tighten your lower  abdominal muscles to press your lower back against the floor. This motion will tilt your pelvis so your tailbone points up toward the ceiling instead of pointing to your feet or the floor. 4. With gentle tension and even breathing, hold this position for 5-10 seconds. Cat-cow Repeat these steps until your lower back becomes more flexible: 1. Get into a hands-and-knees position on a firm surface. Keep your hands under your shoulders, and keep your knees under your hips. You may place padding under your knees for comfort. 2. Let your head hang down toward your chest. Contract your abdominal muscles and point your tailbone toward the floor so your lower back becomes rounded like the back of a cat. 3. Hold this position for 5 seconds. 4. Slowly lift your head, let your abdominal muscles relax and point your tailbone up toward the ceiling so your back forms a sagging arch like the back of a cow. 5. Hold this position for 5 seconds.  Press-ups Repeat these steps 5-10 times: 1. Lie on your abdomen (face-down) on the floor. 2. Place your palms near your head, about shoulder-width apart. 3. Keeping your back as relaxed as possible and keeping your hips on the floor, slowly straighten your arms to raise the top half of your body and lift your shoulders. Do not use your back muscles to raise your upper torso. You may adjust the placement of your hands to make yourself more comfortable. 4. Hold this position for 5 seconds while you keep your back relaxed. 5. Slowly return to lying flat on the floor.  Bridges Repeat these steps 10 times: 1. Lie on your back on a firm surface. 2. Bend your knees so they are pointing toward the ceiling and your feet are flat on the floor. Your arms should be flat at your sides, next to your body. 3. Tighten your buttocks muscles and lift your buttocks off the floor until your waist is at almost the same height as your knees. You should feel the muscles working in your  buttocks and the back of your thighs. If you do not feel these muscles, slide your feet 1-2 inches farther away from your buttocks. 4. Hold this position for 3-5 seconds. 5. Slowly lower your hips to the starting position, and allow your buttocks muscles to relax completely. If this exercise is too easy, try doing it with your arms crossed over your chest. Abdominal crunches Repeat these steps 5-10 times: 1. Lie on your back on a firm bed or the floor with your legs extended. 2. Bend your knees so they are pointing toward the ceiling and your feet are flat on the floor. 3. Cross your arms over your chest. 4. Tip your chin slightly toward your chest without bending your neck. 5. Tighten your abdominal muscles and slowly raise your trunk (torso) high enough to lift your shoulder blades a tiny bit off the floor. Avoid raising your torso higher than that because it can put too much stress on your low back and does not help to strengthen your abdominal muscles. 6. Slowly return to your starting position. Back lifts Repeat these steps 5-10 times: 1. Lie on your abdomen (face-down) with your arms at your sides, and rest your forehead on the floor. 2. Tighten the muscles in your legs and your buttocks. 3. Slowly lift your chest off the floor while you keep your hips pressed to the floor. Keep the back of your head in line with the curve in your back. Your eyes should be looking at the floor. 4. Hold this position for 3-5 seconds. 5. Slowly return to your starting position. Contact a health care provider if:  Your back pain or discomfort gets much worse when you do an exercise.  Your worsening back pain or discomfort does not lessen within 2 hours after you exercise. If you have any of these problems, stop doing these exercises right away. Do not do them again unless your health care provider says that you can. Get help right away if:  You develop sudden, severe back pain. If this happens, stop  doing the exercises right away. Do not do them again unless your health care provider says that you can. This information is not intended to replace advice given to you by your health care provider. Make sure you discuss any questions you have with your health care provider. Document Revised: 11/11/2018 Document Reviewed: 04/08/2018 Elsevier Patient Education  [employer].
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
## 2019-11-16 NOTE — Telephone Encounter (Signed)
Please get notes from [facility] in [place] f/u to referral sent   Thanks tMS

## 2019-11-21 ENCOUNTER — Telehealth: Payer: Self-pay | Admitting: Internal Medicine

## 2019-11-21 NOTE — Telephone Encounter (Signed)
-----   Message from Delorise Jackson, MD sent at 11/16/2019  1:23 PM EDT ----- 1. Fax note to with on cover notes to both MDsA ) Dr. Lanelle Bal neuro need to f/u does he think pt has nph on cover based MRI 09/2019 and falls? Pt also never heard from PT, pt needs f/u appt B) fax note to Dr. Dicie Beam pt did not get cpap machine/equipment yet can they help? Dr. Dicie Beam fa [phone]

## 2019-11-21 NOTE — Telephone Encounter (Signed)
Faxed note to [facility] on 11/21/19 To get office notes

## 2019-11-21 NOTE — Telephone Encounter (Signed)
Faxed to both MDs with requested notes on the cover via [place].

## 2019-11-21 NOTE — Telephone Encounter (Signed)
Request for notes sent to fax.   Phone: [phone]  Phone: [phone]  Fax: [phone]

## 2019-11-25 ENCOUNTER — Other Ambulatory Visit: Payer: Self-pay | Admitting: Internal Medicine

## 2019-11-25 DIAGNOSIS — G319 Degenerative disease of nervous system, unspecified: Secondary | ICD-10-CM | POA: Insufficient documentation

## 2019-11-25 DIAGNOSIS — Z8673 Personal history of transient ischemic attack (TIA), and cerebral infarction without residual deficits: Secondary | ICD-10-CM

## 2019-11-25 DIAGNOSIS — I10 Essential (primary) hypertension: Secondary | ICD-10-CM

## 2019-11-25 DIAGNOSIS — E785 Hyperlipidemia, unspecified: Secondary | ICD-10-CM

## 2019-11-25 MED ORDER — LISINOPRIL 10 MG PO TABS: 10.0000 mg | ORAL_TABLET | Freq: Every day | ORAL | 3 refills | Status: DC

## 2019-11-25 MED ORDER — ASPIRIN EC 81 MG PO TBEC: 81.0000 mg | DELAYED_RELEASE_TABLET | Freq: Every day | ORAL | 3 refills | Status: DC

## 2019-11-25 MED ORDER — ATORVASTATIN CALCIUM 40 MG PO TABS: 40.0000 mg | ORAL_TABLET | Freq: Every day | ORAL | 3 refills | Status: DC

## 2019-11-30 DIAGNOSIS — E538 Deficiency of other specified B group vitamins: Secondary | ICD-10-CM | POA: Insufficient documentation

## 2019-12-02 NOTE — Telephone Encounter (Signed)
Yes - thank you

## 2020-01-10 ENCOUNTER — Telehealth: Payer: Self-pay | Admitting: Internal Medicine

## 2020-01-10 NOTE — Telephone Encounter (Signed)
Called and spoke with the patient. Dr Olivia Mackie McLean-Scocuzza manages his depression. Patient states that over the last two weeks his depression has gotten worse. He is currently on Wellbutrin and Zoloft, states that Dr Olivia Mackie McLean-Scocuzza has tried increasing these.   Patient was seen in December 2020 for depression and again at his last visit, depression was discussed. States there was a referral placed for [facility] at [facility] but their office was too busy.  Patient has called [facility] emergency and was scheduled to see [facility] tomorrow morning for his depression. Patient also has not been sleeping due to this. States sometimes he stays awake until 4:30 am.   Should patient be seen before Dr Olivia Mackie comes back in for evaluation of meds?

## 2020-01-10 NOTE — Telephone Encounter (Signed)
He needs to see [facility] tomorrow- very important. Schedule appt Dr. Olivia Mackie when back.

## 2020-01-10 NOTE — Telephone Encounter (Signed)
Patient would like a phone call back from [place] about his mental health.

## 2020-01-11 NOTE — Telephone Encounter (Signed)
Left message to return call. Please schedule Patient in Dr Olivia Mackie McLean-Scocuzza next available when calling back in.

## 2020-01-11 NOTE — Telephone Encounter (Signed)
Patient calling back in. Re-scheduled for sooner apt of 01/20/20 at 4:00pm.  Patient was unable to be seen at [facility] due to insurance. He states he is okay to wait with sooner appointment. Informed him to call in immediately should anything change.

## 2020-01-20 ENCOUNTER — Ambulatory Visit (INDEPENDENT_AMBULATORY_CARE_PROVIDER_SITE_OTHER): Payer: 59 | Admitting: Internal Medicine

## 2020-01-20 ENCOUNTER — Other Ambulatory Visit: Payer: Self-pay

## 2020-01-20 ENCOUNTER — Encounter: Payer: Self-pay | Admitting: Internal Medicine

## 2020-01-20 ENCOUNTER — Other Ambulatory Visit: Payer: Self-pay | Admitting: Internal Medicine

## 2020-01-20 VITALS — BP 130/86 | HR 67 | Temp 97.8°F | Ht 69.0 in | Wt 316.6 lb

## 2020-01-20 DIAGNOSIS — F339 Major depressive disorder, recurrent, unspecified: Secondary | ICD-10-CM

## 2020-01-20 DIAGNOSIS — F419 Anxiety disorder, unspecified: Secondary | ICD-10-CM

## 2020-01-20 DIAGNOSIS — R269 Unspecified abnormalities of gait and mobility: Secondary | ICD-10-CM

## 2020-01-20 DIAGNOSIS — M25551 Pain in right hip: Secondary | ICD-10-CM

## 2020-01-20 DIAGNOSIS — M25561 Pain in right knee: Secondary | ICD-10-CM

## 2020-01-20 DIAGNOSIS — G8194 Hemiplegia, unspecified affecting left nondominant side: Secondary | ICD-10-CM | POA: Diagnosis not present

## 2020-01-20 DIAGNOSIS — R296 Repeated falls: Secondary | ICD-10-CM | POA: Insufficient documentation

## 2020-01-20 DIAGNOSIS — M25562 Pain in left knee: Secondary | ICD-10-CM

## 2020-01-20 DIAGNOSIS — G8929 Other chronic pain: Secondary | ICD-10-CM
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
## 2020-01-20 NOTE — Progress Notes (Signed)
Patient presenting with increased anxiety and depression.   Patient having pain rated 8/10. Located in the neck, lower back, right shoulder, and right knee (with swelling in the knee). 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   Patient flagged: Current status:  PATIENT IS OVERDUE FOR BMI FOLLOW UP PLAN BMI is estimated to be 46.8 based on the last recorded weight and height

## 2020-01-20 NOTE — Patient Instructions (Addendum)
List of therapist to call   -[facility] call for an appt  [place]  [phone]  Ask insurance about [phone] for weight loss -weekly injection for weight loss    Meditation apps:  Re[employer]ika app motivational/meditation app Bloom Headspace Calm  Insight timer    Shoulders Dr. Mack Guise  Lower body Dr. Harlow Mares     Journal for Nurse Practitioners, 15(4), [phone]. Retrieved April 26, 2018 from [email or web address].[email or web address]">  Knee Exercises Ask your health care provider which exercises are safe for you. Do exercises exactly as told by your health care provider and adjust them as directed. It is normal to feel mild stretching, pulling, tightness, or discomfort as you do these exercises. Stop right away if you feel sudden pain or your pain gets worse. Do not begin these exercises until told by your health care provider. Stretching and range-of-motion exercises These exercises warm up your muscles and joints and improve the movement and flexibility of your knee. These exercises also help to relieve pain and swelling. Knee extension, prone 1. Lie on your abdomen (prone position) on a bed. 2. Place your left / right knee just beyond the edge of the surface so your knee is not on the bed. You can put a towel under your left / right thigh just above your kneecap for comfort. 3. Relax your leg muscles and allow gravity to straighten your knee (extension). You should feel a stretch behind your left / right knee. 4. Hold this position for __________ seconds. 5. Scoot up so your knee is supported between repetitions. Repeat __________ times. Complete this exercise __________ times a day. Knee flexion, active  1. Lie on your back with both legs straight. If this causes back discomfort, bend your left / right knee so your foot is flat on the floor. 2. Slowly slide your left / right heel back toward your buttocks. Stop when you feel a gentle stretch in the front of  your knee or thigh (flexion). 3. Hold this position for __________ seconds. 4. Slowly slide your left / right heel back to the starting position. Repeat __________ times. Complete this exercise __________ times a day. Quadriceps stretch, prone  1. Lie on your abdomen on a firm surface, such as a bed or padded floor. 2. Bend your left / right knee and hold your ankle. If you cannot reach your ankle or pant leg, loop a belt around your foot and grab the belt instead. 3. Gently pull your heel toward your buttocks. Your knee should not slide out to the side. You should feel a stretch in the front of your thigh and knee (quadriceps). 4. Hold this position for __________ seconds. Repeat __________ times. Complete this exercise __________ times a day. Hamstring, supine 1. Lie on your back (supine position). 2. Loop a belt or towel over the ball of your left / right foot. The ball of your foot is on the walking surface, right under your toes. 3. Straighten your left / right knee and slowly pull on the belt to raise your leg until you feel a gentle stretch behind your knee (hamstring). ? Do not let your knee bend while you do this. ? Keep your other leg flat on the floor. 4. Hold this position for __________ seconds. Repeat __________ times. Complete this exercise __________ times a day. Strengthening exercises These exercises build strength and endurance in your knee. Endurance is the ability to use your muscles for a long time, even after  they get tired. Quadriceps, isometric This exercise stretches the muscles in front of your thigh (quadriceps) without moving your knee joint (isometric). 1. Lie on your back with your left / right leg extended and your other knee bent. Put a rolled towel or small pillow under your knee if told by your health care provider. 2. Slowly tense the muscles in the front of your left / right thigh. You should see your kneecap slide up toward your hip or see increased  dimpling just above the knee. This motion will push the back of the knee toward the floor. 3. For __________ seconds, hold the muscle as tight as you can without increasing your pain. 4. Relax the muscles slowly and completely. Repeat __________ times. Complete this exercise __________ times a day. Straight leg raises This exercise stretches the muscles in front of your thigh (quadriceps) and the muscles that move your hips (hip flexors). 1. Lie on your back with your left / right leg extended and your other knee bent. 2. Tense the muscles in the front of your left / right thigh. You should see your kneecap slide up or see increased dimpling just above the knee. Your thigh may even shake a bit. 3. Keep these muscles tight as you raise your leg 4-6 inches (10-15 cm) off the floor. Do not let your knee bend. 4. Hold this position for __________ seconds. 5. Keep these muscles tense as you lower your leg. 6. Relax your muscles slowly and completely after each repetition. Repeat __________ times. Complete this exercise __________ times a day. Hamstring, isometric 1. Lie on your back on a firm surface. 2. Bend your left / right knee about __________ degrees. 3. Dig your left / right heel into the surface as if you are trying to pull it toward your buttocks. Tighten the muscles in the back of your thighs (hamstring) to "dig" as hard as you can without increasing any pain. 4. Hold this position for __________ seconds. 5. Release the tension gradually and allow your muscles to relax completely for __________ seconds after each repetition. Repeat __________ times. Complete this exercise __________ times a day. Hamstring curls If told by your health care provider, do this exercise while wearing ankle weights. Begin with __________ lb weights. Then increase the weight by 1 lb (0.5 kg) increments. Do not wear ankle weights that are more than __________ lb. 1. Lie on your abdomen with your legs  straight. 2. Bend your left / right knee as far as you can without feeling pain. Keep your hips flat against the floor. 3. Hold this position for __________ seconds. 4. Slowly lower your leg to the starting position. Repeat __________ times. Complete this exercise __________ times a day. Squats This exercise strengthens the muscles in front of your thigh and knee (quadriceps). 1. Stand in front of a table, with your feet and knees pointing straight ahead. You may rest your hands on the table for balance but not for support. 2. Slowly bend your knees and lower your hips like you are going to sit in a chair. ? Keep your weight over your heels, not over your toes. ? Keep your lower legs upright so they are parallel with the table legs. ? Do not let your hips go lower than your knees. ? Do not bend lower than told by your health care provider. ? If your knee pain increases, do not bend as low. 3. Hold the squat position for __________ seconds. 4. Slowly push with your  legs to return to standing. Do not use your hands to pull yourself to standing. Repeat __________ times. Complete this exercise __________ times a day. Wall slides This exercise strengthens the muscles in front of your thigh and knee (quadriceps). 1. Lean your back against a smooth wall or door, and walk your feet out 18-24 inches (46-61 cm) from it. 2. Place your feet hip-width apart. 3. Slowly slide down the wall or door until your knees bend __________ degrees. Keep your knees over your heels, not over your toes. Keep your knees in line with your hips. 4. Hold this position for __________ seconds. Repeat __________ times. Complete this exercise __________ times a day. Straight leg raises This exercise strengthens the muscles that rotate the leg at the hip and move it away from your body (hip abductors). 1. Lie on your side with your left / right leg in the top position. Lie so your head, shoulder, knee, and hip line up. You may  bend your bottom knee to help you keep your balance. 2. Roll your hips slightly forward so your hips are stacked directly over each other and your left / right knee is facing forward. 3. Leading with your heel, lift your top leg 4-6 inches (10-15 cm). You should feel the muscles in your outer hip lifting. ? Do not let your foot drift forward. ? Do not let your knee roll toward the ceiling. 4. Hold this position for __________ seconds. 5. Slowly return your leg to the starting position. 6. Let your muscles relax completely after each repetition. Repeat __________ times. Complete this exercise __________ times a day. Straight leg raises This exercise stretches the muscles that move your hips away from the front of the pelvis (hip extensors). 1. Lie on your abdomen on a firm surface. You can put a pillow under your hips if that is more comfortable. 2. Tense the muscles in your buttocks and lift your left / right leg about 4-6 inches (10-15 cm). Keep your knee straight as you lift your leg. 3. Hold this position for __________ seconds. 4. Slowly lower your leg to the starting position. 5. Let your leg relax completely after each repetition. Repeat __________ times. Complete this exercise __________ times a day. This information is not intended to replace advice given to you by your health care provider. Make sure you discuss any questions you have with your health care provider. Document Revised: 04/27/2018 Document Reviewed: 04/27/2018 Elsevier Patient Education  [place].  Hip Exercises Ask your health care provider which exercises are safe for you. Do exercises exactly as told by your health care provider and adjust them as directed. It is normal to feel mild stretching, pulling, tightness, or discomfort as you do these exercises. Stop right away if you feel sudden pain or your pain gets worse. Do not begin these exercises until told by your health care provider. Stretching and  range-of-motion exercises These exercises warm up your muscles and joints and improve the movement and flexibility of your hip. These exercises also help to relieve pain, numbness, and tingling. You may be asked to limit your range of motion if you had a hip replacement. Talk to your health care provider about these restrictions. Hamstrings, supine  6. Lie on your back (supine position). 7. Loop a belt or towel over the ball of your left / right foot. The ball of your foot is on the walking surface, right under your toes. 8. Straighten your left / right knee and  slowly pull on the belt or towel to raise your leg until you feel a gentle stretch behind your knee (hamstring). ? Do not let your knee bend while you do this. ? Keep your other leg flat on the floor. 9. Hold this position for __________ seconds. 10. Slowly return your leg to the starting position. Repeat __________ times. Complete this exercise __________ times a day. Hip rotation  5. Lie on your back on a firm surface. 6. With your left / right hand, gently pull your left / right knee toward the shoulder that is on the same side of the body. Stop when your knee is pointing toward the ceiling. 7. Hold your left / right ankle with your other hand. 8. Keeping your knee steady, gently pull your left / right ankle toward your other shoulder until you feel a stretch in your buttocks. ? Keep your hips and shoulders firmly planted while you do this stretch. 9. Hold this position for __________ seconds. Repeat __________ times. Complete this exercise __________ times a day. Seated stretch This exercise is sometimes called hamstrings and adductors stretch. 5. Sit on the floor with your legs stretched wide. Keep your knees straight during this exercise. 6. Keeping your head and back in a straight line, bend at your waist to reach for your left foot (position A). You should feel a stretch in your right inner thigh (adductors). 7. Hold this  position for __________ seconds. Then slowly return to the upright position. 8. Keeping your head and back in a straight line, bend at your waist to reach forward (position B). You should feel a stretch behind both of your thighs and knees (hamstrings). 9. Hold this position for __________ seconds. Then slowly return to the upright position. 10. Keeping your head and back in a straight line, bend at your waist to reach for your right foot (position C). You should feel a stretch in your left inner thigh (adductors). 11. Hold this position for __________ seconds. Then slowly return to the upright position. Repeat __________ times. Complete this exercise __________ times a day. Lunge This exercise stretches the muscles of the hip (hip flexors). 5. Place your left / right knee on the floor and bend your other knee so that is directly over your ankle. You should be half-kneeling. 6. Keep good posture with your head over your shoulders. 7. Tighten your buttocks to point your tailbone downward. This will prevent your back from arching too much. 8. You should feel a gentle stretch in the front of your left / right thigh and hip. If you do not feel a stretch, slide your other foot forward slightly and then slowly lunge forward with your chest up until your knee once again lines up over your ankle. ? Make sure your tailbone continues to point downward. 9. Hold this position for __________ seconds. 10. Slowly return to the starting position. Repeat __________ times. Complete this exercise __________ times a day. Strengthening exercises These exercises build strength and endurance in your hip. Endurance is the ability to use your muscles for a long time, even after they get tired. Bridge This exercise strengthens the muscles of your hip (hip extensors). 5. Lie on your back on a firm surface with your knees bent and your feet flat on the floor. 6. Tighten your buttocks muscles and lift your bottom off the  floor until the trunk of your body and your hips are level with your thighs. ? Do not arch your back. ? You  should feel the muscles working in your buttocks and the back of your thighs. If you do not feel these muscles, slide your feet 1-2 inches (2.5-5 cm) farther away from your buttocks. 7. Hold this position for __________ seconds. 8. Slowly lower your hips to the starting position. 9. Let your muscles relax completely between repetitions. Repeat __________ times. Complete this exercise __________ times a day. Straight leg raises, side-lying This exercise strengthens the muscles that move the hip joint away from the center of the body (hip abductors). 7. Lie on your side with your left / right leg in the top position. Lie so your head, shoulder, hip, and knee line up. You may bend your bottom knee slightly to help you balance. 8. Roll your hips slightly forward, so your hips are stacked directly over each other and your left / right knee is facing forward. 9. Leading with your heel, lift your top leg 4-6 inches (10-15 cm). You should feel the muscles in your top hip lifting. ? Do not let your foot drift forward. ? Do not let your knee roll toward the ceiling. 10. Hold this position for __________ seconds. 11. Slowly return to the starting position. 12. Let your muscles relax completely between repetitions. Repeat __________ times. Complete this exercise __________ times a day. Straight leg raises, side-lying This exercise strengthens the muscles that move the hip joint toward the center of the body (hip adductors). 6. Lie on your side with your left / right leg in the bottom position. Lie so your head, shoulder, hip, and knee line up. You may place your upper foot in front to help you balance. 7. Roll your hips slightly forward, so your hips are stacked directly over each other and your left / right knee is facing forward. 8. Tense the muscles in your inner thigh and lift your bottom leg 4-6  inches (10-15 cm). 9. Hold this position for __________ seconds. 10. Slowly return to the starting position. 11. Let your muscles relax completely between repetitions. Repeat __________ times. Complete this exercise __________ times a day. Straight leg raises, supine This exercise strengthens the muscles in the front of your thigh (quadriceps). 5. Lie on your back (supine position) with your left / right leg extended and your other knee bent. 6. Tense the muscles in the front of your left / right thigh. You should see your kneecap slide up or see increased dimpling just above your knee. 7. Keep these muscles tight as you raise your leg 4-6 inches (10-15 cm) off the floor. Do not let your knee bend. 8. Hold this position for __________ seconds. 9. Keep these muscles tense as you lower your leg. 10. Relax the muscles slowly and completely between repetitions. Repeat __________ times. Complete this exercise __________ times a day. Hip abductors, standing This exercise strengthens the muscles that move the leg and hip joint away from the center of the body (hip abductors). 5. Tie one end of a rubber exercise band or tubing to a secure surface, such as a chair, table, or pole. 6. Loop the other end of the band or tubing around your left / right ankle. 7. Keeping your ankle with the band or tubing directly opposite the secured end, step away until there is tension in the tubing or band. Hold on to a chair, table, or pole as needed for balance. 8. Lift your left / right leg out to your side. While you do this: ? Keep your back upright. ? Keep your  shoulders over your hips. ? Keep your toes pointing forward. ? Make sure to use your hip muscles to slowly lift your leg. Do not tip your body or forcefully lift your leg. 9. Hold this position for __________ seconds. 10. Slowly return to the starting position. Repeat __________ times. Complete this exercise __________ times a day. Squats This exercise  strengthens the muscles in the front of your thigh (quadriceps). 5. Stand in a door frame so your feet and knees are in line with the frame. You may place your hands on the frame for balance. 6. Slowly bend your knees and lower your hips like you are going to sit in a chair. ? Keep your lower legs in a straight-up-and-down position. ? Do not let your hips go lower than your knees. ? Do not bend your knees lower than told by your health care provider. ? If your hip pain increases, do not bend as low. 7. Hold this position for ___________ seconds. 8. Slowly push with your legs to return to standing. Do not use your hands to pull yourself to standing. Repeat __________ times. Complete this exercise __________ times a day. This information is not intended to replace advice given to you by your health care provider. Make sure you discuss any questions you have with your health care provider. Document Revised: 02/10/2019 Document Reviewed: 05/18/2018 Elsevier Patient Education  [place].

## 2020-01-25 ENCOUNTER — Telehealth: Payer: Self-pay | Admitting: Internal Medicine

## 2020-01-25 NOTE — Telephone Encounter (Signed)
Rejection Reason - Patient Declined" [facility], [place] said about 2 hours ago

## 2020-02-16 ENCOUNTER — Ambulatory Visit: Payer: 59 | Admitting: Internal Medicine

## 2020-02-20 ENCOUNTER — Other Ambulatory Visit: Payer: Self-pay

## 2020-02-20 ENCOUNTER — Ambulatory Visit (INDEPENDENT_AMBULATORY_CARE_PROVIDER_SITE_OTHER): Payer: 59 | Admitting: Licensed Clinical Social Worker

## 2020-02-20 DIAGNOSIS — F329 Major depressive disorder, single episode, unspecified: Secondary | ICD-10-CM | POA: Diagnosis not present

## 2020-02-20 DIAGNOSIS — F419 Anxiety disorder, unspecified: Secondary | ICD-10-CM | POA: Diagnosis not present

## 2020-02-20 DIAGNOSIS — F339 Major depressive disorder, recurrent, unspecified: Secondary | ICD-10-CM

## 2020-02-20 DIAGNOSIS — F32A Depression, unspecified: Secondary | ICD-10-CM
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
## 2020-03-19 ENCOUNTER — Ambulatory Visit
Admission: EM | Admit: 2020-03-19 | Discharge: 2020-03-19 | Disposition: A | Discharge status: Home / Self Care | Payer: 59 | Attending: Family Medicine | Admitting: Family Medicine

## 2020-03-19 DIAGNOSIS — B349 Viral infection, unspecified: Secondary | ICD-10-CM | POA: Diagnosis not present
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
## 2020-03-19 NOTE — ED Triage Notes (Signed)
Patient reports gradual onset of sore throat, sneezing, and bodyaches x4 days.

## 2020-03-19 NOTE — Discharge Instructions (Addendum)
We have tested you for COVID  Go home and quarantine until we get our results.  Res, hydrate  You can take OTC medications as needed.  Follow up as needed for continued or worsening symptoms

## 2020-03-21 LAB — NOVEL CORONAVIRUS, NAA: SARS-CoV-2, NAA: NOT DETECTED

## 2020-03-27 ENCOUNTER — Encounter: Payer: Self-pay | Admitting: Physical Therapy

## 2020-03-27 ENCOUNTER — Other Ambulatory Visit: Payer: Self-pay

## 2020-03-27 ENCOUNTER — Ambulatory Visit: Payer: Medicare Other | Attending: Internal Medicine | Admitting: Physical Therapy

## 2020-03-27 VITALS — BP 143/101

## 2020-03-27 DIAGNOSIS — R2681 Unsteadiness on feet: Secondary | ICD-10-CM | POA: Diagnosis present

## 2020-03-27 DIAGNOSIS — M6281 Muscle weakness (generalized): Secondary | ICD-10-CM

## 2020-03-27 DIAGNOSIS — R1312 Dysphagia, oropharyngeal phase: Secondary | ICD-10-CM | POA: Diagnosis present
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
## 2020-03-29 ENCOUNTER — Ambulatory Visit: Payer: Medicare Other | Admitting: Physical Therapy

## 2020-04-02 ENCOUNTER — Ambulatory Visit: Payer: Medicare Other | Admitting: Physical Therapy

## 2020-04-04 ENCOUNTER — Ambulatory Visit: Payer: Medicare Other | Admitting: Physical Therapy

## 2020-04-04 ENCOUNTER — Telehealth: Payer: Self-pay | Admitting: Internal Medicine

## 2020-04-04 NOTE — Telephone Encounter (Signed)
With history of strokes and onset of sever headache patient informed to be seen at the ED.   Patient is agreeable and will go now.

## 2020-04-04 NOTE — Telephone Encounter (Signed)
Patient called in stated that his head has been hurting sense Monday  He was worried because he had a stroke a while back did not know what he should do.

## 2020-04-05 NOTE — Telephone Encounter (Signed)
Noted agree

## 2020-04-09 ENCOUNTER — Other Ambulatory Visit: Payer: Self-pay

## 2020-04-09 ENCOUNTER — Telehealth (INDEPENDENT_AMBULATORY_CARE_PROVIDER_SITE_OTHER): Payer: Medicare Other | Admitting: Psychiatry

## 2020-04-09 ENCOUNTER — Other Ambulatory Visit: Payer: Self-pay | Admitting: Neurology

## 2020-04-09 ENCOUNTER — Encounter: Payer: Self-pay | Admitting: Psychiatry

## 2020-04-09 DIAGNOSIS — Z8249 Family history of ischemic heart disease and other diseases of the circulatory system: Secondary | ICD-10-CM

## 2020-04-09 DIAGNOSIS — F1221 Cannabis dependence, in remission: Secondary | ICD-10-CM

## 2020-04-09 DIAGNOSIS — F1021 Alcohol dependence, in remission: Secondary | ICD-10-CM | POA: Insufficient documentation

## 2020-04-09 DIAGNOSIS — F410 Panic disorder [episodic paroxysmal anxiety] without agoraphobia: Secondary | ICD-10-CM

## 2020-04-09 DIAGNOSIS — F3181 Bipolar II disorder: Secondary | ICD-10-CM | POA: Diagnosis not present

## 2020-04-09 DIAGNOSIS — F1611 Hallucinogen abuse, in remission: Secondary | ICD-10-CM | POA: Insufficient documentation

## 2020-04-09 DIAGNOSIS — G4700 Insomnia, unspecified: Secondary | ICD-10-CM

## 2020-04-09 DIAGNOSIS — F1411 Cocaine abuse, in remission: Secondary | ICD-10-CM | POA: Insufficient documentation

## 2020-04-09 DIAGNOSIS — G43719 Chronic migraine without aura, intractable, without status migrainosus: Secondary | ICD-10-CM

## 2020-04-09 MED ORDER — PROPRANOLOL HCL 10 MG PO TABS: 10.0000 mg | ORAL_TABLET | Freq: Two times a day (BID) | ORAL | 1 refills | Status: DC | PRN

## 2020-04-09 MED ORDER — LAMOTRIGINE 25 MG PO TABS: ORAL_TABLET | ORAL | 0 refills | Status: DC

## 2020-04-09 NOTE — Patient Instructions (Signed)
Propranolol Tablets What is this medicine? PROPRANOLOL (proe PRAN oh lole) is a beta blocker. It decreases the amount of work your heart has to do and helps your heart beat regularly. It treats high blood pressure and/or prevent chest pain (also called angina). It is also used after a heart attack to prevent a second one. This medicine may be used for other purposes; ask your health care provider or pharmacist if you have questions. COMMON BRAND NAME(S): Inderal What should I tell my health care provider before I take this medicine? They need to know if you have any of these conditions:  circulation problems or blood vessel disease  diabetes  history of heart attack or heart disease, vasospastic angina  kidney disease  liver disease  lung or breathing disease, like asthma or emphysema  pheochromocytoma  slow heart rate  thyroid disease  an unusual or allergic reaction to propranolol, other beta-blockers, medicines, foods, dyes, or preservatives  pregnant or trying to get pregnant  breast-feeding How should I use this medicine? Take this drug by mouth. Take it as directed on the prescription label at the same time every day. Keep taking it unless your health care provider tells you to stop. Talk to your health care provider about the use of this drug in children. Special care may be needed. Overdosage: If you think you have taken too much of this medicine contact a poison control center or emergency room at once. NOTE: This medicine is only for you. Do not share this medicine with others. What if I miss a dose? If you miss a dose, take it as soon as you can. If it is almost time for your next dose, take only that dose. Do not take double or extra doses. What may interact with this medicine? Do not take this medicine with any of the following medications:  feverfew  phenothiazines like chlorpromazine, mesoridazine, prochlorperazine, thioridazine This medicine may also  interact with the following medications:  aluminum hydroxide gel  antipyrine  antiviral medicines for HIV or AIDS  barbiturates like phenobarbital  certain medicines for blood pressure, heart disease, irregular heart beat  cimetidine  ciprofloxacin  diazepam  fluconazole  haloperidol  isoniazid  medicines for cholesterol like cholestyramine or colestipol  medicines for mental depression  medicines for migraine headache like almotriptan, eletriptan, frovatriptan, naratriptan, rizatriptan, sumatriptan, zolmitriptan  NSAIDs, medicines for pain and inflammation, like ibuprofen or naproxen  phenytoin  rifampin  teniposide  theophylline  thyroid medicines  tolbutamide  warfarin  zileuton This list may not describe all possible interactions. Give your health care provider a list of all the medicines, herbs, non-prescription drugs, or dietary supplements you use. Also tell them if you smoke, drink alcohol, or use illegal drugs. Some items may interact with your medicine. What should I watch for while using this medicine? Visit your doctor or health care professional for regular check ups. Check your blood pressure and pulse rate regularly. Ask your health care professional what your blood pressure and pulse rate should be, and when you should contact them. You may get drowsy or dizzy. Do not drive, use machinery, or do anything that needs mental alertness until you know how this drug affects you. Do not stand or sit up quickly, especially if you are an older patient. This reduces the risk of dizzy or fainting spells. Alcohol can make you more drowsy and dizzy. Avoid alcoholic drinks. This medicine may increase blood sugar. Ask your healthcare provider if changes in diet or   medicines are needed if you have diabetes. Do not treat yourself for coughs, colds, or pain while you are taking this medicine without asking your doctor or health care professional for advice. Some  ingredients may increase your blood pressure. What side effects may I notice from receiving this medicine? Side effects that you should report to your doctor or health care professional as soon as possible:  allergic reactions like skin rash, itching or hives, swelling of the face, lips, or tongue  breathing problems  cold hands or feet  difficulty sleeping, nightmares  dry peeling skin  hallucinations  muscle cramps or weakness   signs and symptoms of high blood sugar such as being more thirsty or hungry or having to urinate more than normal. You may also feel very tired or have blurry vision.  slow heart rate  swelling of the legs and ankles  vomiting Side effects that usually do not require medical attention (report to your doctor or health care professional if they continue or are bothersome):  change in sex drive or performance  diarrhea  dry sore eyes  hair loss  nausea  weak or tired This list may not describe all possible side effects. Call your doctor for medical advice about side effects. You may report side effects to FDA at 1-800-FDA-1088. Where should I keep my medicine? Keep out of the reach of children and pets. Store at room temperature between 20 and 25 degrees C (68 and 77 degrees F). Protect from light. Throw away any unused drug after the expiration date. NOTE: This sheet is a summary. It may not cover all possible information. If you have questions about this medicine, talk to your doctor, pharmacist, or health care provider.  2020 [employer]/Gold Standard (2019-02-11 19:25:51) Lamotrigine tablets What is this medicine? LAMOTRIGINE (la MOE Hendricks Limes) is used to control seizures in adults and children with epilepsy and Lennox-Gastaut syndrome. It is also used in adults to treat bipolar disorder. This medicine may be used for other purposes; ask your health care provider or pharmacist if you have questions. COMMON BRAND NAME(S): Lamictal,  Subvenite What should I tell my health care provider before I take this medicine? They need to know if you have any of these conditions:  aseptic meningitis during prior use of lamotrigine  depression  folate deficiency  kidney disease  liver disease  suicidal thoughts, plans, or attempt; a previous suicide attempt by you or a family member  an unusual or allergic reaction to lamotrigine or other seizure medications, other medicines, foods, dyes, or preservatives  pregnant or trying to get pregnant  breast-feeding How should I use this medicine? Take this medicine by mouth with a glass of water. Follow the directions on the prescription label. Do not chew these tablets. If this medicine upsets your stomach, take it with food or milk. Take your doses at regular intervals. Do not take your medicine more often than directed. A special MedGuide will be given to you by the pharmacist with each new prescription and refill. Be sure to read this information carefully each time. Talk to your pediatrician regarding the use of this medicine in children. While this drug may be prescribed for children as young as 2 years for selected conditions, precautions do apply. Overdosage: If you think you have taken too much of this medicine contact a poison control center or emergency room at once. NOTE: This medicine is only for you. Do not share this medicine with others. What if I miss a  dose? If you miss a dose, take it as soon as you can. If it is almost time for your next dose, take only that dose. Do not take double or extra doses. What may interact with this medicine?  atazanavir  carbamazepine  male hormones, including contraceptive or birth control pills  lopinavir  methotrexate  phenobarbital  phenytoin  primidone  pyrimethamine  rifampin  ritonavir  trimethoprim  valproic acid This list may not describe all possible interactions. Give your health care provider a list  of all the medicines, herbs, non-prescription drugs, or dietary supplements you use. Also tell them if you smoke, drink alcohol, or use illegal drugs. Some items may interact with your medicine. What should I watch for while using this medicine? Visit your doctor or health care provider for regular checks on your progress. If you take this medicine for seizures, wear a Medic Alert bracelet or necklace. Carry an identification card with information about your condition, medicines, and doctor or health care provider. It is important to take this medicine exactly as directed. When first starting treatment, your dose will need to be adjusted slowly. It may take weeks or months before your dose is stable. You should contact your doctor or health care provider if your seizures get worse or if you have any new types of seizures. Do not stop taking this medicine unless instructed by your doctor or health care provider. Stopping your medicine suddenly can increase your seizures or their severity. This medicine may cause serious skin reactions. They can happen weeks to months after starting the medicine. Contact your health care provider right away if you notice fevers or flu-like symptoms with a rash. The rash may be red or purple and then turn into blisters or peeling of the skin. Or, you might notice a red rash with swelling of the face, lips or lymph nodes in your neck or under your arms. You may get drowsy, dizzy, or have blurred vision. Do not drive, use machinery, or do anything that needs mental alertness until you know how this medicine affects you. To reduce dizzy or fainting spells, do not sit or stand up quickly, especially if you are an older patient. Alcohol can increase drowsiness and dizziness. Avoid alcoholic drinks. If you are taking this medicine for bipolar disorder, it is important to report any changes in your mood to your doctor or health care provider. If your condition gets worse, you get  mentally depressed, feel very hyperactive or manic, have difficulty sleeping, or have thoughts of hurting yourself or committing suicide, you need to get help from your health care provider right away. If you are a caregiver for someone taking this medicine for bipolar disorder, you should also report these behavioral changes right away. The use of this medicine may increase the chance of suicidal thoughts or actions. Pay special attention to how you are responding while on this medicine. Your mouth may get dry. Chewing sugarless gum or sucking hard candy, and drinking plenty of water may help. Contact your doctor if the problem does not go away or is severe. Women who become pregnant while using this medicine may enroll in the [place] Pregnancy Registry by calling [phone]. This registry collects information about the safety of antiepileptic drug use during pregnancy. This medicine may cause a decrease in folic acid. You should make sure that you get enough folic acid while you are taking this medicine. Discuss the foods you eat and the vitamins you take  with your health care provider. What side effects may I notice from receiving this medicine? Side effects that you should report to your doctor or health care professional as soon as possible:  allergic reactions like skin rash, itching or hives, swelling of the face, lips, or tongue  changes in vision  depressed mood  elevated mood, decreased need for sleep, racing thoughts, impulsive behavior  loss of balance or coordination  mouth sores  rash, fever, and swollen lymph nodes  redness, blistering, peeling or loosening of the skin, including inside the mouth  right upper belly pain  seizures  severe muscle pain  signs and symptoms of aseptic meningitis such as stiff neck and sensitivity to light, headache, drowsiness, fever, nausea, vomiting, rash  signs of infection - fever or chills, cough, sore  throat, pain or difficulty passing urine  suicidal thoughts or other mood changes  swollen lymph nodes  trouble walking  unusual bruising or bleeding  unusually weak or tired  yellowing of the eyes or skin Side effects that usually do not require medical attention (report to your doctor or health care professional if they continue or are bothersome):  diarrhea  dizziness  dry mouth  stuffy nose  tiredness  tremors  trouble sleeping This list may not describe all possible side effects. Call your doctor for medical advice about side effects. You may report side effects to FDA at 1-800-FDA-1088. Where should I keep my medicine? Keep out of reach of children. Store at room temperature between 15 and 30 degrees C (59 and 86 degrees F). Throw away any unused medicine after the expiration date. NOTE: This sheet is a summary. It may not cover all possible information. If you have questions about this medicine, talk to your doctor, pharmacist, or health care provider.  2020 [employer]vier/Gold Standard (2018-10-08 15:03:40)
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
## 2020-04-11 ENCOUNTER — Encounter: Payer: Self-pay | Admitting: Physical Therapy

## 2020-04-11 ENCOUNTER — Other Ambulatory Visit: Payer: Self-pay

## 2020-04-11 ENCOUNTER — Ambulatory Visit: Payer: Medicare Other | Admitting: Physical Therapy

## 2020-04-11 VITALS — BP 111/89

## 2020-04-11 DIAGNOSIS — R2681 Unsteadiness on feet: Secondary | ICD-10-CM

## 2020-04-11 DIAGNOSIS — M6281 Muscle weakness (generalized): Secondary | ICD-10-CM | POA: Diagnosis not present
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
## 2020-04-11 NOTE — Patient Instructions (Signed)
Access Code: [email or web address] URL: [email or web address] Date: 04/11/2020 Prepared by: Blanche East  Exercises Seated Ankle Dorsiflexion with Resistance - 1 x daily - 7 x weekly - 3 sets - 15 reps

## 2020-04-16 ENCOUNTER — Encounter: Payer: Self-pay | Admitting: Physical Therapy

## 2020-04-16 ENCOUNTER — Ambulatory Visit: Payer: Medicare Other | Admitting: Physical Therapy

## 2020-04-16 ENCOUNTER — Other Ambulatory Visit: Payer: Self-pay

## 2020-04-16 VITALS — BP 174/88

## 2020-04-16 DIAGNOSIS — M6281 Muscle weakness (generalized): Secondary | ICD-10-CM | POA: Diagnosis not present

## 2020-04-16 DIAGNOSIS — R2681 Unsteadiness on feet: Secondary | ICD-10-CM
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
## 2020-04-18 ENCOUNTER — Other Ambulatory Visit: Payer: Self-pay

## 2020-04-18 ENCOUNTER — Encounter: Payer: Self-pay | Admitting: Physical Therapy

## 2020-04-18 ENCOUNTER — Ambulatory Visit: Payer: Medicare Other

## 2020-04-18 VITALS — BP 157/103 | HR 83

## 2020-04-18 DIAGNOSIS — M6281 Muscle weakness (generalized): Secondary | ICD-10-CM

## 2020-04-18 DIAGNOSIS — R2681 Unsteadiness on feet: Secondary | ICD-10-CM

## 2020-04-18 DIAGNOSIS — R1312 Dysphagia, oropharyngeal phase: Secondary | ICD-10-CM
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
## 2020-04-23 ENCOUNTER — Ambulatory Visit: Payer: Medicare Other | Admitting: Physical Therapy

## 2020-04-24 ENCOUNTER — Telehealth: Payer: 59 | Admitting: Neurology

## 2020-04-25 ENCOUNTER — Other Ambulatory Visit: Payer: Self-pay

## 2020-04-25 ENCOUNTER — Encounter: Payer: Self-pay | Admitting: Physical Therapy

## 2020-04-25 ENCOUNTER — Ambulatory Visit: Payer: Medicare Other | Attending: Internal Medicine | Admitting: Physical Therapy

## 2020-04-25 VITALS — BP 139/94 | HR 84

## 2020-04-25 DIAGNOSIS — M6281 Muscle weakness (generalized): Secondary | ICD-10-CM | POA: Insufficient documentation

## 2020-04-25 DIAGNOSIS — R2681 Unsteadiness on feet: Secondary | ICD-10-CM
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
## 2020-04-26 ENCOUNTER — Ambulatory Visit: Payer: 59

## 2020-04-30 ENCOUNTER — Ambulatory Visit: Payer: Medicare Other | Admitting: Physical Therapy

## 2020-05-01 ENCOUNTER — Telehealth: Payer: Self-pay | Admitting: Psychiatry

## 2020-05-01 ENCOUNTER — Ambulatory Visit (INDEPENDENT_AMBULATORY_CARE_PROVIDER_SITE_OTHER): Payer: 59 | Admitting: Internal Medicine

## 2020-05-01 ENCOUNTER — Other Ambulatory Visit: Payer: Self-pay

## 2020-05-01 ENCOUNTER — Encounter: Payer: Self-pay | Admitting: Internal Medicine

## 2020-05-01 VITALS — BP 140/100 | HR 97 | Temp 97.7°F | Resp 16 | Ht 69.02 in | Wt 313.2 lb

## 2020-05-01 DIAGNOSIS — R7303 Prediabetes: Secondary | ICD-10-CM

## 2020-05-01 DIAGNOSIS — G4733 Obstructive sleep apnea (adult) (pediatric): Secondary | ICD-10-CM

## 2020-05-01 DIAGNOSIS — Z125 Encounter for screening for malignant neoplasm of prostate: Secondary | ICD-10-CM

## 2020-05-01 DIAGNOSIS — F32A Depression, unspecified: Secondary | ICD-10-CM

## 2020-05-01 DIAGNOSIS — I10 Essential (primary) hypertension: Secondary | ICD-10-CM

## 2020-05-01 DIAGNOSIS — Z6841 Body Mass Index (BMI) 40.0 and over, adult: Secondary | ICD-10-CM

## 2020-05-01 DIAGNOSIS — Z1329 Encounter for screening for other suspected endocrine disorder: Secondary | ICD-10-CM

## 2020-05-01 DIAGNOSIS — B351 Tinea unguium: Secondary | ICD-10-CM | POA: Diagnosis not present

## 2020-05-01 DIAGNOSIS — Z9989 Dependence on other enabling machines and devices: Secondary | ICD-10-CM | POA: Insufficient documentation

## 2020-05-01 DIAGNOSIS — M25511 Pain in right shoulder: Secondary | ICD-10-CM

## 2020-05-01 DIAGNOSIS — Z23 Encounter for immunization: Secondary | ICD-10-CM | POA: Diagnosis not present

## 2020-05-01 DIAGNOSIS — E559 Vitamin D deficiency, unspecified: Secondary | ICD-10-CM

## 2020-05-01 DIAGNOSIS — F419 Anxiety disorder, unspecified: Secondary | ICD-10-CM

## 2020-05-01 DIAGNOSIS — Z1389 Encounter for screening for other disorder: Secondary | ICD-10-CM

## 2020-05-01 DIAGNOSIS — M545 Low back pain, unspecified: Secondary | ICD-10-CM

## 2020-05-01 DIAGNOSIS — G8929 Other chronic pain: Secondary | ICD-10-CM

## 2020-05-01 MED ORDER — SERTRALINE HCL 100 MG PO TABS: 100.0000 mg | ORAL_TABLET | Freq: Every day | ORAL | 3 refills | Status: DC

## 2020-05-01 MED ORDER — BUPROPION HCL ER (XL) 300 MG PO TB24: 300.0000 mg | ORAL_TABLET | Freq: Every day | ORAL | 3 refills | Status: DC

## 2020-05-01 MED ORDER — MUPIROCIN 2 % EX OINT: 1.0000 "application " | TOPICAL_OINTMENT | Freq: Three times a day (TID) | CUTANEOUS | 2 refills | Status: DC

## 2020-05-01 MED ORDER — AMLODIPINE BESYLATE 5 MG PO TABS: 5.0000 mg | ORAL_TABLET | Freq: Every day | ORAL | 3 refills | Status: DC

## 2020-05-01 MED ORDER — CARVEDILOL 25 MG PO TABS: 25.0000 mg | ORAL_TABLET | Freq: Two times a day (BID) | ORAL | 3 refills | Status: DC

## 2020-05-01 MED ORDER — TERBINAFINE HCL 250 MG PO TABS: 250.0000 mg | ORAL_TABLET | Freq: Every day | ORAL | 0 refills | Status: DC
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
## 2020-05-01 NOTE — Telephone Encounter (Signed)
Attempted to contact patient since I received a message from his primary care provider the patient is on Coreg and will need another antianxiety agent other than propranolol.  However patient unavailable, unable to leave voicemail.  Patient however does have an appointment appointment

## 2020-05-01 NOTE — Patient Instructions (Addendum)
Dr. Vickki Muff or Dr. Caryl Comes podiatry  Dr. Roland Rack  [facility]     [place] stands for "Dietary Approaches to Stop Hypertension." The DASH eating plan is a healthy eating plan that has been shown to reduce high blood pressure (hypertension). It may also reduce your risk for type 2 diabetes, heart disease, and stroke. The DASH eating plan may also help with weight loss. What are tips for following this plan?  General guidelines  Avoid eating more than 2,300 mg (milligrams) of salt (sodium) a day. If you have hypertension, you may need to reduce your sodium intake to 1,500 mg a day.  Limit alcohol intake to no more than 1 drink a day for nonpregnant women and 2 drinks a day for men. One drink equals 12 oz of beer, 5 oz of wine, or 1 oz of hard liquor.  Work with your health care provider to maintain a healthy body weight or to lose weight. Ask what an ideal weight is for you.  Get at least 30 minutes of exercise that causes your heart to beat faster (aerobic exercise) most days of the week. Activities may include walking, swimming, or biking.  Work with your health care provider or diet and nutrition specialist (dietitian) to adjust your eating plan to your individual calorie needs. Reading food labels   Check food labels for the amount of sodium per serving. Choose foods with less than 5 percent of the Daily Value of sodium. Generally, foods with less than 300 mg of sodium per serving fit into this eating plan.  To find whole grains, look for the word "whole" as the first word in the ingredient list. Shopping  Buy products labeled as "low-sodium" or "no salt added."  Buy fresh foods. Avoid canned foods and premade or frozen meals. Cooking  Avoid adding salt when cooking. Use salt-free seasonings or herbs instead of table salt or sea salt. Check with your health care provider or pharmacist before using salt substitutes.  Do not fry foods. Cook foods using healthy methods  such as baking, boiling, grilling, and broiling instead.  Cook with heart-healthy oils, such as olive, canola, soybean, or sunflower oil. Meal planning  Eat a balanced diet that includes: ? 5 or more servings of fruits and vegetables each day. At each meal, try to fill half of your plate with fruits and vegetables. ? Up to 6-8 servings of whole grains each day. ? Less than 6 oz of lean meat, poultry, or fish each day. A 3-oz serving of meat is about the same size as a deck of cards. One egg equals 1 oz. ? 2 servings of low-fat dairy each day. ? A serving of nuts, seeds, or beans 5 times each week. ? Heart-healthy fats. Healthy fats called Omega-3 fatty acids are found in foods such as flaxseeds and coldwater fish, like sardines, salmon, and mackerel.  Limit how much you eat of the following: ? Canned or prepackaged foods. ? Food that is high in trans fat, such as fried foods. ? Food that is high in saturated fat, such as fatty meat. ? Sweets, desserts, sugary drinks, and other foods with added sugar. ? Full-fat dairy products.  Do not salt foods before eating.  Try to eat at least 2 vegetarian meals each week.  Eat more home-cooked food and less restaurant, buffet, and fast food.  When eating at a restaurant, ask that your food be prepared with less salt or no salt, if possible. What foods  are recommended? The items listed may not be a complete list. Talk with your dietitian about what dietary choices are best for you. Grains Whole-grain or whole-wheat bread. Whole-grain or whole-wheat pasta. Brown rice. [place]. Bulgur. Whole-grain and low-sodium cereals. Pita bread. Low-fat, low-sodium crackers. Whole-wheat flour tortillas. Vegetables Fresh or frozen vegetables (raw, steamed, roasted, or grilled). Low-sodium or reduced-sodium tomato and vegetable juice. Low-sodium or reduced-sodium tomato sauce and tomato paste. Low-sodium or reduced-sodium canned vegetables. Fruits All  fresh, dried, or frozen fruit. Canned fruit in natural juice (without added sugar). Meat and other protein foods Skinless chicken or [place]. Ground chicken or [place]. Pork with fat trimmed off. Fish and seafood. Egg whites. Dried beans, peas, or lentils. Unsalted nuts, nut butters, and seeds. Unsalted canned beans. Lean cuts of beef with fat trimmed off. Low-sodium, lean deli meat. Dairy Low-fat (1%) or fat-free (skim) milk. Fat-free, low-fat, or reduced-fat cheeses. Nonfat, low-sodium ricotta or cottage cheese. Low-fat or nonfat yogurt. Low-fat, low-sodium cheese. Fats and oils Soft margarine without trans fats. Vegetable oil. Low-fat, reduced-fat, or light mayonnaise and salad dressings (reduced-sodium). Canola, safflower, olive, soybean, and sunflower oils. Avocado. Seasoning and other foods Herbs. Spices. Seasoning mixes without salt. Unsalted popcorn and pretzels. Fat-free sweets. What foods are not recommended? The items listed may not be a complete list. Talk with your dietitian about what dietary choices are best for you. Grains Baked goods made with fat, such as croissants, muffins, or some breads. Dry pasta or rice meal packs. Vegetables Creamed or fried vegetables. Vegetables in a cheese sauce. Regular canned vegetables (not low-sodium or reduced-sodium). Regular canned tomato sauce and paste (not low-sodium or reduced-sodium). Regular tomato and vegetable juice (not low-sodium or reduced-sodium). Angie Fava. Olives. Fruits Canned fruit in a light or heavy syrup. Fried fruit. Fruit in cream or butter sauce. Meat and other protein foods Fatty cuts of meat. Ribs. Fried meat. Berniece Salines. Sausage. Bologna and other processed lunch meats. Salami. Fatback. Hotdogs. Bratwurst. Salted nuts and seeds. Canned beans with added salt. Canned or smoked fish. Whole eggs or egg yolks. Chicken or [place] with skin. Dairy Whole or 2% milk, cream, and half-and-half. Whole or full-fat cream cheese. Whole-fat or  sweetened yogurt. Full-fat cheese. Nondairy creamers. Whipped toppings. Processed cheese and cheese spreads. Fats and oils Butter. Stick margarine. Lard. Shortening. Ghee. Bacon fat. Tropical oils, such as coconut, palm kernel, or palm oil. Seasoning and other foods Salted popcorn and pretzels. Onion salt, garlic salt, seasoned salt, table salt, and sea salt. Worcestershire sauce. Tartar sauce. Barbecue sauce. Teriyaki sauce. Soy sauce, including reduced-sodium. Steak sauce. Canned and packaged gravies. Fish sauce. Oyster sauce. Cocktail sauce. Horseradish that you find on the shelf. Ketchup. Mustard. Meat flavorings and tenderizers. Bouillon cubes. Hot sauce and Tabasco sauce. Premade or packaged marinades. Premade or packaged taco seasonings. Relishes. Regular salad dressings. Where to find more information:  National Heart, Lung, and [place]: https://wilson-eaton.com/  American Heart Association: www.heart.org Summary  The DASH eating plan is a healthy eating plan that has been shown to reduce high blood pressure (hypertension). It may also reduce your risk for type 2 diabetes, heart disease, and stroke.  With the DASH eating plan, you should limit salt (sodium) intake to 2,300 mg a day. If you have hypertension, you may need to reduce your sodium intake to 1,500 mg a day.  When on the DASH eating plan, aim to eat more fresh fruits and vegetables, whole grains, lean proteins, low-fat dairy, and heart-healthy fats.  Work with your  health care provider or diet and nutrition specialist (dietitian) to adjust your eating plan to your individual calorie needs. This information is not intended to replace advice given to you by your health care provider. Make sure you discuss any questions you have with your health care provider. Document Revised: 06/19/2017 Document Reviewed: 06/30/2016 Elsevier Patient Education  2020 [place] is an infection of the skin that  surrounds a nail. It usually affects the skin around a fingernail, but it may also occur near a toenail. It often causes pain and swelling around the nail. In some cases, a collection of pus (abscess) can form near or under the nail.  This condition may develop suddenly, or it may develop gradually over a longer period. In most cases, paronychia is not serious, and it will clear up with treatment. What are the causes? This condition may be caused by bacteria or a fungus. These germs can enter the body through an opening in the skin, such as a cut or a hangnail. What increases the risk? This condition is more likely to develop in people who:  Get their hands wet often, such as those who work as Designer, industrial/product, bartenders, or nurses.  Bite their fingernails or suck their thumbs.  Trim their nails very short.  Have hangnails or injured fingertips.  Get manicures.  Have diabetes. What are the signs or symptoms? Symptoms of this condition include:  Redness and swelling of the skin near the nail.  Tenderness around the nail when you touch the area.  Pus-filled bumps under the skin at the base and sides of the nail (cuticle).  Fluid or pus under the nail.  Throbbing pain in the area. How is this diagnosed? This condition is diagnosed with a physical exam. In some cases, a sample of pus may be tested to determine what type of bacteria or fungus is causing the condition. How is this treated? Treatment depends on the cause and severity of your condition. If your condition is mild, it may clear up on its own in a few days or after soaking in warm water. If needed, treatment may include:  Antibiotic medicine, if your infection is caused by bacteria.  Antifungal medicine, if your infection is caused by a fungus.  A procedure to drain pus from an abscess.  Anti-inflammatory medicine (corticosteroids). Follow these instructions at home: Wound care  Keep the affected area clean.  Soak the  affected area in warm water, if told to do so by your health care provider. You may be told to do this for 20 minutes, 2-3 times a day.  Keep the area dry when you are not soaking it.  Do not try to drain an abscess yourself.  Follow instructions from your health care provider about how to take care of the affected area. Make sure you: ? Wash your hands with soap and water before you change your bandage (dressing). If soap and water are not available, use hand sanitizer. ? Change your dressing as told by your health care provider.  If you had an abscess drained, check the area every day for signs of infection. Check for: ? Redness, swelling, or pain. ? Fluid or blood. ? Warmth. ? Pus or a bad smell. Medicines   Take over-the-counter and prescription medicines only as told by your health care provider.  If you were prescribed an antibiotic medicine, take it as told by your health care provider. Do not stop taking the antibiotic even if you start  to feel better. General instructions  Avoid contact with harsh chemicals.  Do not pick at the affected area. Prevention  To prevent this condition from happening again: ? Wear rubber gloves when washing dishes or doing other tasks that require your hands to get wet. ? Wear gloves if your hands might come in contact with cleaners or other chemicals. ? Avoid injuring your nails or fingertips. ? Do not bite your nails or tear hangnails. ? Do not cut your nails very short. ? Do not cut your cuticles. ? Use clean nail clippers or scissors when trimming nails. Contact a health care provider if:  Your symptoms get worse or do not improve with treatment.  You have continued or increased fluid, blood, or pus coming from the affected area.  Your finger or knuckle becomes swollen or difficult to move. Get help right away if you have:  A fever or chills.  Redness spreading away from the affected area.  Joint or muscle  pain. Summary  Paronychia is an infection of the skin that surrounds a nail. It often causes pain and swelling around the nail. In some cases, a collection of pus (abscess) can form near or under the nail.  This condition may be caused by bacteria or a fungus. These germs can enter the body through an opening in the skin, such as a cut or a hangnail.  If your condition is mild, it may clear up on its own in a few days. If needed, treatment may include medicine or a procedure to drain pus from an abscess.  To prevent this condition from happening again, wear gloves if doing tasks that require your hands to get wet or to come in contact with chemicals. Also avoid injuring your nails or fingertips. This information is not intended to replace advice given to you by your health care provider. Make sure you discuss any questions you have with your health care provider. Document Revised: 07/24/2017 Document Reviewed: 07/20/2017 Elsevier Patient Education  2020 [place].  Fungal Nail Infection A fungal nail infection is a common infection of the toenails or fingernails. This condition affects toenails more often than fingernails. It often affects the great, or big, toes. More than one nail may be infected. The condition can be passed from person to person (is contagious). What are the causes? This condition is caused by a fungus. Several types of fungi can cause the infection. These fungi are common in moist and warm areas. If your hands or feet come into contact with the fungus, it may get into a crack in your fingernail or toenail and cause the infection. What increases the risk? The following factors may make you more likely to develop this condition:  Being male.  Being of older age.  Living with someone who has the fungus.  Walking barefoot in areas where the fungus thrives, such as showers or locker rooms.  Wearing shoes and socks that cause your feet to sweat.  Having a nail injury  or a recent nail surgery.  Having certain medical conditions, such as: ? Athlete's foot. ? Diabetes. ? Psoriasis. ? Poor circulation. ? A weak body defense system (immune system). What are the signs or symptoms? Symptoms of this condition include:  A pale spot on the nail.  Thickening of the nail.  A nail that becomes yellow or brown.  A brittle or ragged nail edge.  A crumbling nail.  A nail that has lifted away from the nail bed. How is this  diagnosed? This condition is diagnosed with a physical exam. Your health care provider may take a scraping or clipping from your nail to test for the fungus. How is this treated? Treatment is not needed for mild infections. If you have significant nail changes, treatment may include:  Antifungal medicines taken by mouth (orally). You may need to take the medicine for several weeks or several months, and you may not see the results for a long time. These medicines can cause side effects. Ask your health care provider what problems to watch for.  Antifungal nail polish or nail cream. These may be used along with oral antifungal medicines.  Laser treatment of the nail.  Surgery to remove the nail. This may be needed for the most severe infections. It can take a long time, usually up to a year, for the infection to go away. The infection may also come back. Follow these instructions at home: Medicines  Take or apply over-the-counter and prescription medicines only as told by your health care provider.  Ask your health care provider about using over-the-counter mentholated ointment on your nails. Nail care  Trim your nails often.  Wash and dry your hands and feet every day.  Keep your feet dry: ? Wear absorbent socks, and change your socks frequently. ? Wear shoes that allow air to circulate, such as sandals or canvas tennis shoes. Throw out old shoes.  Do not use artificial nails.  If you go to a nail salon, make sure you choose  one that uses clean instruments.  Use antifungal foot powder on your feet and in your shoes. General instructions  Do not share personal items, such as towels or nail clippers.  Do not walk barefoot in shower rooms or locker rooms.  Wear rubber gloves if you are working with your hands in wet areas.  Keep all follow-up visits as told by your health care provider. This is important. Contact a health care provider if: Your infection is not getting better or it is getting worse after several months. Summary  A fungal nail infection is a common infection of the toenails or fingernails.  Treatment is not needed for mild infections. If you have significant nail changes, treatment may include taking medicine orally and applying medicine to your nails.  It can take a long time, usually up to a year, for the infection to go away. The infection may also come back.  Take or apply over-the-counter and prescription medicines only as told by your health care provider.  Follow instructions for taking care of your nails to help prevent infection from coming back or spreading. This information is not intended to replace advice given to you by your health care provider. Make sure you discuss any questions you have with your health care provider. Document Revised: 10/28/2018 Document Reviewed: 12/11/2017 Elsevier Patient Education  [place].

## 2020-05-02 ENCOUNTER — Encounter: Payer: Self-pay | Admitting: Physical Therapy

## 2020-05-02 ENCOUNTER — Ambulatory Visit: Payer: Medicare Other | Admitting: Physical Therapy

## 2020-05-02 VITALS — BP 145/99

## 2020-05-02 DIAGNOSIS — M6281 Muscle weakness (generalized): Secondary | ICD-10-CM | POA: Diagnosis not present

## 2020-05-02 DIAGNOSIS — R2681 Unsteadiness on feet: Secondary | ICD-10-CM
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
## 2020-05-07 ENCOUNTER — Other Ambulatory Visit: Payer: Self-pay | Admitting: Psychiatry

## 2020-05-07 DIAGNOSIS — F3181 Bipolar II disorder: Secondary | ICD-10-CM

## 2020-05-15 ENCOUNTER — Other Ambulatory Visit: Payer: Self-pay

## 2020-05-15 ENCOUNTER — Encounter: Payer: Self-pay | Admitting: Psychiatry

## 2020-05-15 ENCOUNTER — Ambulatory Visit: Payer: 59 | Admitting: Physical Therapy

## 2020-05-15 ENCOUNTER — Telehealth (INDEPENDENT_AMBULATORY_CARE_PROVIDER_SITE_OTHER): Payer: Medicare Other | Admitting: Psychiatry

## 2020-05-15 DIAGNOSIS — G4700 Insomnia, unspecified: Secondary | ICD-10-CM | POA: Diagnosis not present

## 2020-05-15 DIAGNOSIS — F410 Panic disorder [episodic paroxysmal anxiety] without agoraphobia: Secondary | ICD-10-CM

## 2020-05-15 DIAGNOSIS — F1411 Cocaine abuse, in remission: Secondary | ICD-10-CM

## 2020-05-15 DIAGNOSIS — Z9189 Other specified personal risk factors, not elsewhere classified: Secondary | ICD-10-CM

## 2020-05-15 DIAGNOSIS — F1221 Cannabis dependence, in remission: Secondary | ICD-10-CM

## 2020-05-15 DIAGNOSIS — F1021 Alcohol dependence, in remission: Secondary | ICD-10-CM

## 2020-05-15 DIAGNOSIS — F3181 Bipolar II disorder: Secondary | ICD-10-CM | POA: Diagnosis not present

## 2020-05-15 DIAGNOSIS — F1611 Hallucinogen abuse, in remission: Secondary | ICD-10-CM

## 2020-05-15 MED ORDER — CLONAZEPAM 0.5 MG PO TABS: 0.5000 mg | ORAL_TABLET | ORAL | 1 refills | Status: DC

## 2020-05-15 MED ORDER — LAMOTRIGINE 100 MG PO TABS: 50.0000 mg | ORAL_TABLET | Freq: Two times a day (BID) | ORAL | 0 refills | Status: DC
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
## 2020-05-16 ENCOUNTER — Other Ambulatory Visit (INDEPENDENT_AMBULATORY_CARE_PROVIDER_SITE_OTHER): Payer: 59

## 2020-05-16 ENCOUNTER — Ambulatory Visit (INDEPENDENT_AMBULATORY_CARE_PROVIDER_SITE_OTHER): Payer: Medicare Other | Admitting: Licensed Clinical Social Worker

## 2020-05-16 ENCOUNTER — Other Ambulatory Visit: Payer: Self-pay

## 2020-05-16 DIAGNOSIS — Z125 Encounter for screening for malignant neoplasm of prostate: Secondary | ICD-10-CM

## 2020-05-16 DIAGNOSIS — E559 Vitamin D deficiency, unspecified: Secondary | ICD-10-CM

## 2020-05-16 DIAGNOSIS — F3181 Bipolar II disorder: Secondary | ICD-10-CM

## 2020-05-16 DIAGNOSIS — R7303 Prediabetes: Secondary | ICD-10-CM | POA: Diagnosis not present

## 2020-05-16 DIAGNOSIS — Z1329 Encounter for screening for other suspected endocrine disorder: Secondary | ICD-10-CM

## 2020-05-16 DIAGNOSIS — I10 Essential (primary) hypertension: Secondary | ICD-10-CM

## 2020-05-16 DIAGNOSIS — Z1389 Encounter for screening for other disorder: Secondary | ICD-10-CM

## 2020-05-16 LAB — LIPID PANEL
Cholesterol: 131 mg/dL (ref 0–200)
HDL: 46.4 mg/dL (ref >39.00)
LDL Cholesterol: 46 mg/dL (ref 0–99)
NonHDL: 84.45
Total CHOL/HDL Ratio: 3
Triglycerides: 190 mg/dL — ABNORMAL HIGH (ref 0.0–149.0)
VLDL: 38 mg/dL (ref 0.0–40.0)

## 2020-05-16 LAB — COMPREHENSIVE METABOLIC PANEL
ALT: 16 U/L (ref 0–53)
AST: 17 U/L (ref 0–37)
Albumin: 4.1 g/dL (ref 3.5–5.2)
Alkaline Phosphatase: 80 U/L (ref 39–117)
BUN: 22 mg/dL (ref 6–23)
CO2: 30 mEq/L (ref 19–32)
Calcium: 9.2 mg/dL (ref 8.4–10.5)
Chloride: 100 mEq/L (ref 96–112)
Creatinine, Ser: 1.43 mg/dL (ref 0.40–1.50)
GFR: 54.74 mL/min — ABNORMAL LOW (ref >60.00)
Glucose, Bld: 75 mg/dL (ref 70–99)
Potassium: 4.1 mEq/L (ref 3.5–5.1)
Sodium: 138 mEq/L (ref 135–145)
Total Bilirubin: 0.6 mg/dL (ref 0.2–1.2)
Total Protein: 6.4 g/dL (ref 6.0–8.3)

## 2020-05-16 LAB — CBC WITH DIFFERENTIAL/PLATELET
Basophils Absolute: 0.1 10*3/uL (ref 0.0–0.1)
Basophils Relative: 1 % (ref 0.0–3.0)
Eosinophils Absolute: 0.4 10*3/uL (ref 0.0–0.7)
Eosinophils Relative: 4.6 % (ref 0.0–5.0)
HCT: 43.5 % (ref 39.0–52.0)
Hemoglobin: 14.4 g/dL (ref 13.0–17.0)
Lymphocytes Relative: 18.8 % (ref 12.0–46.0)
Lymphs Abs: 1.8 10*3/uL (ref 0.7–4.0)
MCHC: 33.2 g/dL (ref 30.0–36.0)
MCV: 91.3 fl (ref 78.0–100.0)
Monocytes Absolute: 0.7 10*3/uL (ref 0.1–1.0)
Monocytes Relative: 7.6 % (ref 3.0–12.0)
Neutro Abs: 6.4 10*3/uL (ref 1.4–7.7)
Neutrophils Relative %: 68 % (ref 43.0–77.0)
Platelets: 217 10*3/uL (ref 150.0–400.0)
RBC: 4.76 Mil/uL (ref 4.22–5.81)
RDW: 14.1 % (ref 11.5–15.5)
WBC: 9.4 10*3/uL (ref 4.0–10.5)

## 2020-05-16 LAB — HEMOGLOBIN A1C: Hgb A1c MFr Bld: 6 % (ref 4.6–6.5)

## 2020-05-16 LAB — VITAMIN D 25 HYDROXY (VIT D DEFICIENCY, FRACTURES): VITD: 33.89 ng/mL (ref 30.00–100.00)

## 2020-05-16 LAB — TSH: TSH: 1.25 u[IU]/mL (ref 0.35–4.50)

## 2020-05-16 LAB — PSA: PSA: 0.19 ng/mL (ref 0.10–4.00)

## 2020-05-16 NOTE — Progress Notes (Signed)
Virtual Visit via Video Note  I connected with Jake Brown on 05/16/20 at 10:00 AM EDT by a video enabled telemedicine application and verified that I am speaking with the correct person using two identifiers.  Location: Patient: home Provider: remote office [place], [place])   I discussed the limitations of evaluation and management by telemedicine and the availability of in person appointments. The patient expressed understanding and agreed to proceed.   I discussed the assessment and treatment plan with the patient. The patient was provided an opportunity to ask questions and all were answered. The patient agreed with the plan and demonstrated an understanding of the instructions.   The patient was advised to call back or seek an in-person evaluation if the symptoms worsen or if the condition fails to improve as anticipated.  I provided 45 minutes of non-face-to-face time during this encounter.   Jake Mcdougall R Trevan Messman, LCSW    THERAPIST PROGRESS NOTE  Session Time: 10:00-10:45a  Participation Level: Active  Behavioral Response: NeatAlertDepressed  Type of Therapy: Individual Therapy  Treatment Goals addressed: Coping  Interventions: CBT and Supportive  Summary: Jake Brown is a 54 y.o. male who presents with continuing symptoms related to his bipolar diagnosis. Pt reports that mood is stable and that he is managing his anxiety/stress better. Pt reports fluctuating quality of sleep.  Appetite WNL--pt trying to make healthier choices.   Allowed pt to explore and express thoughts and feelings associated with the following external stressors:  Health-related concerns, loss of marriage relationship, coping with chronic daily pain.   Discussed health-related concerns and overall psychological impact. Pt is left with some physical limitations to overall activity--has a dog walker that comes to the house daily to help out. Pt currently taking PT 2 x wk. Discussed pros  to PT and encouraged pt to continue.  Discussed interventions that are helpful with chronic pain--balance of activity/rest. Pt also reports that he is trying to make some changes in his diet that will be positive contributing factors to overall health (incorporating more B vitamins, turmeric).   Pt shared details about previous relationship--even how life has changed from living in [place] versus [place]. Allowed pt to express good times and difficult moments.   Pt admits that he often has some fleeting suicidal thoughts due to the intensity of pain that he experiences. Provided pt with local crisis resources (email).   Encouraged pt to continue focusing on self care, social engagement, regular physical activity and rest.   Suicidal/Homicidal: Yes.  Fleeting thoughts about death--pt states not suicidal. Pt denies plans or intent to follow through. Provided pt with local crisis resources--emailed resources.   Therapist Response: Jake Brown reports that he is continuing to have on-and-off symptoms, which is indicative of fluctuating/intermittent progress. Treatment to continue as indicated.  Plan: Return again in 2 weeks. The ongoing treatment plan includes maintaining current levels of progress and continuing to build skills to manage mood, improve stress/anxiety management, emotion regulation, distress tolerance, and behavior modification.   Diagnosis: Axis I: Bipolar, Depressed    Axis II: No diagnosis    Jake Bo Noelly Lasseigne, LCSW 05/16/2020

## 2020-05-17 LAB — URINALYSIS, ROUTINE W REFLEX MICROSCOPIC
Bilirubin Urine: NEGATIVE
Glucose, UA: NEGATIVE
Hgb urine dipstick: NEGATIVE
Ketones, ur: NEGATIVE
Leukocytes,Ua: NEGATIVE
Nitrite: NEGATIVE
Protein, ur: NEGATIVE
Specific Gravity, Urine: 1.01 (ref 1.001–1.03)
pH: 6 (ref 5.0–8.0)

## 2020-05-21 ENCOUNTER — Ambulatory Visit: Payer: Medicare Other | Attending: Internal Medicine | Admitting: Physical Therapy

## 2020-05-21 ENCOUNTER — Encounter: Payer: Self-pay | Admitting: Physical Therapy

## 2020-05-21 ENCOUNTER — Other Ambulatory Visit: Payer: Self-pay

## 2020-05-21 VITALS — BP 163/112

## 2020-05-21 DIAGNOSIS — M6281 Muscle weakness (generalized): Secondary | ICD-10-CM | POA: Insufficient documentation

## 2020-05-21 DIAGNOSIS — R2681 Unsteadiness on feet: Secondary | ICD-10-CM | POA: Insufficient documentation
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
## 2020-05-23 ENCOUNTER — Ambulatory Visit: Payer: Medicare Other | Admitting: Physical Therapy

## 2020-05-31 ENCOUNTER — Other Ambulatory Visit: Payer: Self-pay

## 2020-05-31 ENCOUNTER — Encounter: Payer: Self-pay | Admitting: Physical Therapy

## 2020-05-31 ENCOUNTER — Ambulatory Visit: Payer: Medicare Other | Admitting: Physical Therapy

## 2020-05-31 VITALS — BP 138/105

## 2020-05-31 DIAGNOSIS — M6281 Muscle weakness (generalized): Secondary | ICD-10-CM | POA: Diagnosis present

## 2020-05-31 DIAGNOSIS — R2681 Unsteadiness on feet: Secondary | ICD-10-CM
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
## 2020-06-05 ENCOUNTER — Telehealth: Payer: Self-pay | Admitting: Internal Medicine

## 2020-06-05 ENCOUNTER — Ambulatory Visit: Payer: Medicare Other

## 2020-06-05 ENCOUNTER — Ambulatory Visit (INDEPENDENT_AMBULATORY_CARE_PROVIDER_SITE_OTHER): Payer: Medicare Other | Admitting: Licensed Clinical Social Worker

## 2020-06-05 ENCOUNTER — Other Ambulatory Visit: Payer: Self-pay

## 2020-06-05 ENCOUNTER — Other Ambulatory Visit: Payer: Self-pay | Admitting: Psychiatry

## 2020-06-05 DIAGNOSIS — R2681 Unsteadiness on feet: Secondary | ICD-10-CM

## 2020-06-05 DIAGNOSIS — M6281 Muscle weakness (generalized): Secondary | ICD-10-CM | POA: Diagnosis not present

## 2020-06-05 DIAGNOSIS — F3181 Bipolar II disorder: Secondary | ICD-10-CM
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
## 2020-06-05 NOTE — Telephone Encounter (Signed)
Faxed to [facility]  For Rx refill authorization request for Carvedilol 25MG  tablet. Faxed on 06-04-2020

## 2020-06-06 ENCOUNTER — Other Ambulatory Visit: Payer: Self-pay | Admitting: Psychiatry

## 2020-06-06 DIAGNOSIS — F410 Panic disorder [episodic paroxysmal anxiety] without agoraphobia: Secondary | ICD-10-CM

## 2020-06-07 ENCOUNTER — Ambulatory Visit: Payer: Medicare Other | Admitting: Physical Therapy

## 2020-06-07 ENCOUNTER — Other Ambulatory Visit: Payer: Self-pay

## 2020-06-07 ENCOUNTER — Encounter: Payer: Self-pay | Admitting: Physical Therapy

## 2020-06-07 VITALS — BP 161/111

## 2020-06-07 DIAGNOSIS — R2681 Unsteadiness on feet: Secondary | ICD-10-CM

## 2020-06-07 DIAGNOSIS — M6281 Muscle weakness (generalized): Secondary | ICD-10-CM
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
## 2020-06-12 ENCOUNTER — Ambulatory Visit: Payer: Medicare Other | Admitting: Physical Therapy

## 2020-06-13 ENCOUNTER — Other Ambulatory Visit: Payer: Self-pay

## 2020-06-13 ENCOUNTER — Telehealth (INDEPENDENT_AMBULATORY_CARE_PROVIDER_SITE_OTHER): Payer: Medicare Other | Admitting: Psychiatry

## 2020-06-13 ENCOUNTER — Encounter: Payer: Self-pay | Admitting: Psychiatry

## 2020-06-13 DIAGNOSIS — G4700 Insomnia, unspecified: Secondary | ICD-10-CM | POA: Diagnosis not present

## 2020-06-13 DIAGNOSIS — F1411 Cocaine abuse, in remission: Secondary | ICD-10-CM

## 2020-06-13 DIAGNOSIS — F410 Panic disorder [episodic paroxysmal anxiety] without agoraphobia: Secondary | ICD-10-CM

## 2020-06-13 DIAGNOSIS — F3181 Bipolar II disorder: Secondary | ICD-10-CM | POA: Diagnosis not present

## 2020-06-13 DIAGNOSIS — F1021 Alcohol dependence, in remission: Secondary | ICD-10-CM | POA: Diagnosis not present

## 2020-06-13 DIAGNOSIS — F1221 Cannabis dependence, in remission: Secondary | ICD-10-CM

## 2020-06-13 DIAGNOSIS — F1611 Hallucinogen abuse, in remission: Secondary | ICD-10-CM

## 2020-06-13 DIAGNOSIS — Z9189 Other specified personal risk factors, not elsewhere classified: Secondary | ICD-10-CM | POA: Insufficient documentation

## 2020-06-13 MED ORDER — LAMOTRIGINE 25 MG PO TABS: 25.0000 mg | ORAL_TABLET | Freq: Every day | ORAL | 0 refills | Status: DC

## 2020-06-13 MED ORDER — LAMOTRIGINE 100 MG PO TABS: 100.0000 mg | ORAL_TABLET | Freq: Every day | ORAL | 0 refills | Status: DC
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
## 2020-06-13 NOTE — Telephone Encounter (Signed)
Faxed again on the 06-12-2020 to [facility] for Carvedilol  Tablet for 12.5 mg take one tablet by mouth twice a day

## 2020-06-25 ENCOUNTER — Encounter: Payer: Self-pay | Admitting: Internal Medicine

## 2020-06-27 ENCOUNTER — Ambulatory Visit: Payer: 59 | Admitting: Physical Therapy

## 2020-06-28 ENCOUNTER — Encounter: Payer: Self-pay | Admitting: Internal Medicine

## 2020-06-28 ENCOUNTER — Other Ambulatory Visit: Payer: Self-pay

## 2020-06-28 ENCOUNTER — Ambulatory Visit (INDEPENDENT_AMBULATORY_CARE_PROVIDER_SITE_OTHER): Payer: Medicare Other | Admitting: Licensed Clinical Social Worker

## 2020-06-28 ENCOUNTER — Ambulatory Visit (INDEPENDENT_AMBULATORY_CARE_PROVIDER_SITE_OTHER): Payer: Medicare Other | Admitting: Internal Medicine

## 2020-06-28 VITALS — BP 110/80 | HR 72 | Temp 97.6°F | Ht 69.0 in | Wt 319.6 lb

## 2020-06-28 DIAGNOSIS — H9313 Tinnitus, bilateral: Secondary | ICD-10-CM | POA: Diagnosis not present

## 2020-06-28 DIAGNOSIS — F339 Major depressive disorder, recurrent, unspecified: Secondary | ICD-10-CM | POA: Diagnosis not present

## 2020-06-28 DIAGNOSIS — Z9189 Other specified personal risk factors, not elsewhere classified: Secondary | ICD-10-CM | POA: Diagnosis not present

## 2020-06-28 DIAGNOSIS — F3181 Bipolar II disorder: Secondary | ICD-10-CM | POA: Diagnosis not present

## 2020-06-28 DIAGNOSIS — F410 Panic disorder [episodic paroxysmal anxiety] without agoraphobia: Secondary | ICD-10-CM

## 2020-06-28 DIAGNOSIS — I10 Essential (primary) hypertension: Secondary | ICD-10-CM | POA: Diagnosis not present

## 2020-06-28 MED ORDER — AMLODIPINE BESYLATE 5 MG PO TABS: ORAL_TABLET | ORAL | 3 refills | Status: DC
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
## 2020-06-28 NOTE — Progress Notes (Signed)
Virtual Visit via Video Note  I connected with GREYSEN DEVINO on 06/28/20 at  2:30 PM EST by a video enabled telemedicine application and verified that I am speaking with the correct person using two identifiers.  Video connection was lost when less than 50% of the duration of the visit was complete, at which time the remainder of the visit was completed via audio only.   Location: Patient: home Provider: [facility]   I discussed the limitations of evaluation and management by telemedicine and the availability of in person appointments. The patient expressed understanding and agreed to proceed.   The patient was advised to call back or seek an in-person evaluation if the symptoms worsen or if the condition fails to improve as anticipated.  I provided 60 minutes of non-face-to-face time during this encounter.   Joyclyn Plazola R Carole Deere, LCSW    THERAPIST PROGRESS NOTE  Session Time: 2:30-3:30p  Participation Level: Active  Behavioral Response: Neat and Well GroomedAlertAnxious  Type of Therapy: Individual Therapy  Treatment Goals addressed: Anxiety  Interventions: Supportive  Summary: SLAYTER MOORHOUSE is a 54 y.o. male who presents with improving symptoms related to bipolar disorder and panic disorder. Pt reports that mood has been stable and that panic episodes are fewer than in previous sessions. Pt reports that he is getting good quality and quantity of sleep.   Discussed family relationships--pt is in close contact with family members. Pt feels good about all family relationships at time of session.  Allowed pt to explore some events from the past that were particularly traumatic--pt explained in detail several scenarios that pt found disturbing in the past and have triggered emotional reactions. Discussed triggers and managing emotional response.   Discussed organizational skills and concerns pt has about memory. Pt has close relationship with neurology practitioner and is  compliant with all recommendations. Pt focused on   Allowed pt to share how he is managing overall self care and life management--encouraged him to continue. Encouraged positive social support.    Suicidal/Homicidal: No. Pt reporting some auditory sounds that may possibly be hallucinations--will continue to be self aware and report any future incidents.  Therapist Response: Brazen continues to make good progress with overall trauma processing, self care, and life management. Pt is also regulating mood well, which is indicative of progress. Treatment showing good evolution and development.   Plan: Return again in 3 weeks.  Diagnosis: Axis I: Bipolar, Depressed and Panic Disorder    Axis II: No diagnosis    Rachel Bo [place]ston [place], LCSW 06/28/2020

## 2020-06-28 NOTE — Patient Instructions (Addendum)
Treatment resistant depression  ECT transmagnetic/transcranial stimulation  Nasal ketamine   When ready call and reschedule  05/09/2019 Telephone [facility]  [place], [place] [address]  [phone]  Gerrit Heck, [place]  [address]  [place], [place] [address]  [phone]  [phone] (Fax)  Reschedule colonoscopy   Call [facility] and see if they can expedite films to [facility] clinic ortho  [phone] phone fax [phone]  Nasal saline and flonase or nasacort  Allegra/claritin/zyrtec or xyzal at night   Let me know when ready for ENT, dermatology you can call GI and ortho    Tinnitus Tinnitus refers to hearing a sound when there is no actual source for that sound. This is often described as ringing in the ears. However, people with this condition may hear a variety of noises, in one ear or in both ears. The sounds of tinnitus can be soft, loud, or somewhere in between. Tinnitus can last for a few seconds or can be constant for days. It may go away without treatment and come back at various times. When tinnitus is constant or happens often, it can lead to other problems, such as trouble sleeping and trouble concentrating. Almost everyone experiences tinnitus at some point. Tinnitus that is long-lasting (chronic) or comes back often (recurs) may require medical attention. What are the causes? The cause of tinnitus is often not known. In some cases, it can result from:  Exposure to loud noises from machinery, music, or other sources.  An object (foreign body) stuck in the ear.  Earwax buildup.  Drinking alcohol or caffeine.  Taking certain medicines.  Age-related hearing loss. It may also be caused by medical conditions such as:  Ear or sinus infections.  High blood pressure.  Heart diseases.  Anemia.  Allergies.  Meniere's disease.  Thyroid problems.  Tumors.  A weak, bulging blood vessel (aneurysm)  near the ear. What are the signs or symptoms? The main symptom of tinnitus is hearing a sound when there is no source for that sound. It may sound like:  Buzzing.  Roaring.  Ringing.  Blowing air.  Hissing.  Whistling.  Sizzling.  Humming.  Running water.  A musical note.  Tapping. Symptoms may affect only one ear (unilateral) or both ears (bilateral). How is this diagnosed? Tinnitus is diagnosed based on your symptoms, your medical history, and a physical exam. Your health care provider may do a thorough hearing test (audiologic exam) if your tinnitus:  Is unilateral.  Causes hearing difficulties.  Lasts 6 months or longer. You may work with a health care provider who specializes in hearing disorders (audiologist). You may be asked questions about your symptoms and how they affect your daily life. You may have other tests done, such as:  CT scan.  MRI.  An imaging test of how blood flows through your blood vessels (angiogram). How is this treated? Treating an underlying medical condition can sometimes make tinnitus go away. If your tinnitus continues, other treatments may include:  Medicines.  Therapy and counseling to help you manage the stress of living with tinnitus.  Sound generators to mask the tinnitus. These include: ? Tabletop sound machines that play relaxing sounds to help you fall asleep. ? Wearable devices that fit in your ear and play sounds or music. ? Acoustic neural stimulation. This involves using headphones to listen to music that contains an auditory signal. Over time, listening to this signal may change some pathways in your  brain and make you less sensitive to tinnitus. This treatment is used for very severe cases when no other treatment is working.  Using hearing aids or cochlear implants if your tinnitus is related to hearing loss. Hearing aids are worn in the outer ear. Cochlear implants are surgically placed in the inner ear. Follow  these instructions at home: Managing symptoms      When possible, avoid being in loud places and being exposed to loud sounds.  Wear hearing protection, such as earplugs, when you are exposed to loud noises.  Use a white noise machine, a humidifier, or other devices to mask the sound of tinnitus.  Practice techniques for reducing stress, such as meditation, yoga, or deep breathing. Work with your health care provider if you need help with managing stress.  Sleep with your head slightly raised. This may reduce the impact of tinnitus. General instructions  Do not use stimulants, such as nicotine, alcohol, or caffeine. Talk with your health care provider about other stimulants to avoid. Stimulants are substances that can make you feel alert and attentive by increasing certain activities in the body (such as heart rate and blood pressure). These substances may make tinnitus worse.  Take over-the-counter and prescription medicines only as told by your health care provider.  Try to get plenty of sleep each night.  Keep all follow-up visits as told by your health care provider. This is important. Contact a health care provider if:  Your tinnitus continues for 3 weeks or longer without stopping.  You develop sudden hearing loss.  Your symptoms get worse or do not get better with home care.  You feel you are not able to manage the stress of living with tinnitus. Get help right away if:  You develop tinnitus after a head injury.  You have tinnitus along with any of the following: ? Dizziness. ? Loss of balance. ? Nausea and vomiting. ? Sudden, severe headache. These symptoms may represent a serious problem that is an emergency. Do not wait to see if the symptoms will go away. Get medical help right away. Call your local emergency services (911 in the [place] Do not drive yourself to the hospital. Summary  Tinnitus refers to hearing a sound when there is no actual source for that  sound. This is often described as ringing in the ears.  Symptoms may affect only one ear (unilateral) or both ears (bilateral).  Use a white noise machine, a humidifier, or other devices to mask the sound of tinnitus.  Do not use stimulants, such as nicotine, alcohol, or caffeine. Talk with your health care provider about other stimulants to avoid. These substances may make tinnitus worse. This information is not intended to replace advice given to you by your health care provider. Make sure you discuss any questions you have with your health care provider. Document Revised: 01/19/2019 Document Reviewed: 04/16/2017 Elsevier Patient Education  2020 [employer].

## 2020-07-02 ENCOUNTER — Ambulatory Visit (INDEPENDENT_AMBULATORY_CARE_PROVIDER_SITE_OTHER): Payer: Medicare Other | Admitting: Psychiatry

## 2020-07-02 ENCOUNTER — Encounter: Payer: Self-pay | Admitting: Psychiatry

## 2020-07-02 ENCOUNTER — Other Ambulatory Visit: Payer: Self-pay

## 2020-07-02 DIAGNOSIS — F339 Major depressive disorder, recurrent, unspecified: Secondary | ICD-10-CM | POA: Diagnosis not present

## 2020-07-02 NOTE — Progress Notes (Signed)
.Virtual Visit via Telephone Note  I connected with Jake Brown on 07/02/20 at  1:00 PM EST by telephone and verified that I am speaking with the correct person using two identifiers.  Location: Patient: Home Provider: Hospital   I discussed the limitations, risks, security and privacy concerns of performing an evaluation and management service by telephone and the availability of in person appointments. I also discussed with the patient that there may be a patient responsible charge related to this service. The patient expressed understanding and agreed to proceed.   History of Present Illness: Patient was reached by telephone for a scheduled ECT consult.  Patient was on time appropriate and informed of the nature of the consult.  Chart reviewed as well.  54 year old man reports severe depression.  Mood feels down and negative most all of the time.  Has lost interest in almost all of his usual activities.  Energy level poor.  Sleep frequently poor at night.  Talks about not being able to "turn my mind off" but what he is describing sounds mostly like anxiety and sleeplessness.  Does not mention any euphoria or mania.  He does report that he has recently been having what he thinks might be auditory hallucinations but his description of them are feeling like he hears someone knocking on his door when no one is in fact doing that.  Patient is seeing an outpatient psychiatrist and is currently taking a medicine combination including Zoloft lamotrigine and Wellbutrin.  Does not feel like he has had any improvement with that.  Patient says he has occasional suicidal thoughts but has no intention or plan of acting on it.  No homicidal thoughts.  Denies that he is recently been drinking or using any drugs.  Patient was referred to be considered for ECT.  He reports that his depression started a couple years ago probably about 2018.  He says it had started shortly before his wife left him but when his lot  wife did leave him things got much worse.  He also had a stroke at some point in the past and feels that since that time the depression has been very intractable.  Reviewing the old chart he had a thalamic stroke in March 2019.  Since that time depression has been even worse.  Patient has been unable to function at work.  Feels his social contact is very poor.    Observations/Objective: Patient was appropriate and gave lucid history.  Alert and oriented x4.  Affect dysphoric but reactive.  Thoughts lucid without any disorganized or delusional thinking.  Suicidal thoughts present but without any plan or intent to act on it.  No homicidal ideation.  Reports of hallucinations sound atypical at best.  Patient was able to understand the nature of the therapy discussed and make reasonable questions and engage in thoughtful conversation to result in appropriate decisions   Assessment and Plan: This is a 54 year old man with a history of severe recurrent depression possible bipolar 2 who has had intractable depression particularly since having a stroke in 2019.  Unclear if the specific type of stroke could have been directly related to the depression.  In any case still very depressed down negative hopeless but not actively psychotic and able to make reasonable decisions about possible treatment.  Patient would be a potential candidate for electroconvulsive therapy.  Discussed with him the nature of ECT the pros and cons the details of our procedure here at the hospital.  Patient would have  some elevated risk because of history of stroke but she would still be a reasonable candidate.  Taking lamotrigine now but at modest doses which would probably not require adjustment.  Patient was advised that we would not be able to start ECT for a new patient until at the earliest the first week of January probably the fifth.  He tentatively wants to discuss plans to get started.  He will be referred to our nurse running the  program who can get in contact with him and follow-up about appropriate treatment and work-up.  Patient has my phone number and can call sooner if needed.   Follow Up Instructions: We will contact him to schedule work-up and treatment plans.    I discussed the assessment and treatment plan with the patient. The patient was provided an opportunity to ask questions and all were answered. The patient agreed with the plan and demonstrated an understanding of the instructions.   The patient was advised to call back or seek an in-person evaluation if the symptoms worsen or if the condition fails to improve as anticipated.  I provided 45 minutes of non-face-to-face time during this encounter.   Alethia Berthold, MD

## 2020-07-03 ENCOUNTER — Ambulatory Visit: Payer: Medicare Other | Admitting: Physical Therapy

## 2020-07-04 ENCOUNTER — Other Ambulatory Visit: Payer: Self-pay | Admitting: Psychiatry

## 2020-07-04 DIAGNOSIS — R200 Anesthesia of skin: Secondary | ICD-10-CM

## 2020-07-05 ENCOUNTER — Ambulatory Visit: Payer: Medicare Other | Attending: Internal Medicine | Admitting: Physical Therapy

## 2020-07-05 DIAGNOSIS — R2681 Unsteadiness on feet: Secondary | ICD-10-CM | POA: Insufficient documentation

## 2020-07-05 DIAGNOSIS — M6281 Muscle weakness (generalized): Secondary | ICD-10-CM | POA: Insufficient documentation

## 2020-07-06 ENCOUNTER — Telehealth: Payer: Self-pay | Admitting: Psychiatry

## 2020-07-06 NOTE — Telephone Encounter (Signed)
I have reviewed EKG dated 06/28/2020-QTC within normal limits, normal sinus rhythm.

## 2020-07-09 ENCOUNTER — Ambulatory Visit: Payer: Medicare Other | Admitting: Physical Therapy

## 2020-07-09 ENCOUNTER — Other Ambulatory Visit: Payer: Self-pay

## 2020-07-09 ENCOUNTER — Encounter: Payer: Self-pay | Admitting: Physical Therapy

## 2020-07-09 DIAGNOSIS — R2681 Unsteadiness on feet: Secondary | ICD-10-CM

## 2020-07-09 DIAGNOSIS — M6281 Muscle weakness (generalized): Secondary | ICD-10-CM | POA: Diagnosis present
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
## 2020-07-11 ENCOUNTER — Ambulatory Visit: Payer: Medicare Other | Admitting: Physical Therapy

## 2020-07-11 ENCOUNTER — Other Ambulatory Visit: Payer: Self-pay

## 2020-07-11 ENCOUNTER — Encounter: Payer: Self-pay | Admitting: Physical Therapy

## 2020-07-11 DIAGNOSIS — M6281 Muscle weakness (generalized): Secondary | ICD-10-CM | POA: Diagnosis not present

## 2020-07-11 DIAGNOSIS — R2681 Unsteadiness on feet: Secondary | ICD-10-CM
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
## 2020-07-16 ENCOUNTER — Ambulatory Visit: Payer: Medicare Other | Admitting: Physical Therapy

## 2020-07-18 ENCOUNTER — Ambulatory Visit: Payer: Medicare Other | Admitting: Physical Therapy

## 2020-07-18 ENCOUNTER — Other Ambulatory Visit: Payer: Self-pay

## 2020-07-18 ENCOUNTER — Encounter: Payer: Self-pay | Admitting: Physical Therapy

## 2020-07-18 DIAGNOSIS — M6281 Muscle weakness (generalized): Secondary | ICD-10-CM

## 2020-07-18 DIAGNOSIS — R2681 Unsteadiness on feet: Secondary | ICD-10-CM
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
## 2020-07-19 ENCOUNTER — Other Ambulatory Visit: Payer: Self-pay

## 2020-07-19 ENCOUNTER — Telehealth (INDEPENDENT_AMBULATORY_CARE_PROVIDER_SITE_OTHER): Payer: Medicare Other | Admitting: Psychiatry

## 2020-07-19 ENCOUNTER — Encounter: Payer: Self-pay | Admitting: Psychiatry

## 2020-07-19 DIAGNOSIS — F1411 Cocaine abuse, in remission: Secondary | ICD-10-CM

## 2020-07-19 DIAGNOSIS — F1021 Alcohol dependence, in remission: Secondary | ICD-10-CM | POA: Diagnosis not present

## 2020-07-19 DIAGNOSIS — F3181 Bipolar II disorder: Secondary | ICD-10-CM

## 2020-07-19 DIAGNOSIS — F1611 Hallucinogen abuse, in remission: Secondary | ICD-10-CM

## 2020-07-19 DIAGNOSIS — F1221 Cannabis dependence, in remission: Secondary | ICD-10-CM

## 2020-07-19 DIAGNOSIS — G4701 Insomnia due to medical condition: Secondary | ICD-10-CM | POA: Diagnosis not present

## 2020-07-19 DIAGNOSIS — F410 Panic disorder [episodic paroxysmal anxiety] without agoraphobia: Secondary | ICD-10-CM | POA: Diagnosis not present

## 2020-07-19 MED ORDER — BREXPIPRAZOLE 0.25 MG PO TABS
0.2500 mg | ORAL_TABLET | Freq: Every day | ORAL | 0 refills | Status: AC
Start: 2020-07-19 — End: 2020-07-26

## 2020-07-19 MED ORDER — REXULTI 0.5 MG PO TABS: 0.5000 mg | ORAL_TABLET | Freq: Every day | ORAL | 1 refills | Status: DC

## 2020-07-19 MED ORDER — CLONAZEPAM 0.5 MG PO TABS: 0.5000 mg | ORAL_TABLET | ORAL | 1 refills | Status: DC

## 2020-07-19 MED ORDER — BUPROPION HCL ER (XL) 150 MG PO TB24: 150.0000 mg | ORAL_TABLET | Freq: Every day | ORAL | 0 refills | Status: DC

## 2020-07-19 NOTE — Patient Instructions (Signed)
Brexpiprazole oral tablets What is this medicine? BREXPIPRAZOLE (brex PIP ray zole) is an antipsychotic. It is used to treat schizophrenia. This medicine is also used in combination with antidepressants to treat major depressive disorder. This medicine may be used for other purposes; ask your health care provider or pharmacist if you have questions. COMMON BRAND NAME(S): REXULTI What should I tell my health care provider before I take this medicine? They need to know if you have any of these conditions:  dementia  diabetes  difficulty swallowing  heart disease  high cholesterol or high levels of triglycerides in the blood  history of stroke  kidney disease  liver disease  low blood counts, like low white cell, platelet, or red cell counts  Parkinson's disease  seizures  suicidal thoughts, plans, or attempt; a previous suicide attempt by you or a family member  an unusual or allergic reaction to brexpiprazole, other medicines, foods, dyes, or preservatives  pregnant or trying to get pregnant  breast-feeding How should I use this medicine? Take this medicine by mouth with a glass of water. Follow the directions on the prescription label. You can take this medicine with or without food. Take your doses at regular intervals. Do not take your medicine more often than directed. Do not stop taking except on the advice of your doctor or health care professional. A special MedGuide will be given to you by the pharmacist with each prescription and refill. Be sure to read this information carefully each time. Talk to your pediatrician regarding the use of this medicine in children. Special care may be needed. Overdosage: If you think you have taken too much of this medicine contact a poison control center or emergency room at once. NOTE: This medicine is only for you. Do not share this medicine with others. What if I miss a dose? If you miss a dose, take it as soon as you can. If it  is almost time for your next dose, take only that dose. Do not take double or extra doses. What may interact with this medicine? Do not take this medicine with any of the following medications:  aripiprazole  metoclopramide This medicine may also interact with the following medications:  clarithromycin  certain medicines for blood pressure  certain medicines for fungal infections like fluconazole, itraconazole, ketoconazole  duloxetine  fluoxetine  paroxetine  quinidine  rifampin  St. John's Wort This list may not describe all possible interactions. Give your health care provider a list of all the medicines, herbs, non-prescription drugs, or dietary supplements you use. Also tell them if you smoke, drink alcohol, or use illegal drugs. Some items may interact with your medicine. What should I watch for while using this medicine? Visit your health care professional for regular checks on your progress. Tell your health care professional if symptoms do not start to get better or if they get worse. Do not stop taking except on your health care professional's advice. You may develop a severe reaction. Your health care professional will tell you how much medicine to take. Patients and their families should watch out for new or worsening depression or thoughts of suicide. Also watch out for sudden changes in feelings such as feeling anxious, agitated, panicky, irritable, hostile, aggressive, impulsive, severely restless, overly excited and hyperactive, or not being able to sleep. If this happens, especially at the beginning of antidepressant treatment or after a change in dose, call your health care professional. You may get dizzy or drowsy. Do not drive,   use machinery, or do anything that needs mental alertness until you know how this medicine affects you. Do not stand or sit up quickly, especially if you are an older patient. This reduces the risk of dizzy or fainting spells. Alcohol may  interfere with the effect of this medicine. Avoid alcoholic drinks. There are have been reports of increased sexual urges or other strong urges such as gambling while taking this medicine. If you experience any of these while taking this medicine, you should report this to your health care professional as soon as possible. This medicine may cause dry eyes and blurred vision. If you wear contact lenses you may feel some discomfort. Lubricating drops may help. See your eye doctor if the problem does not go away or is severe. This medicine may increase blood sugar. Ask your health care provider if changes in diet or medicines are needed if you have diabetes. This drug can cause problems with controlling your body temperature. It can lower the response of your body to cold temperatures. If possible, stay indoors during cold weather. If you must go outdoors, wear warm clothes. It can also lower the response of your body to heat. Do not overheat. Do not over-exercise. Stay out of the sun when possible. If you must be in the sun, wear cool clothing. Drink plenty of water. If you have trouble controlling your body temperature, call your health care provider right away. What side effects may I notice from receiving this medicine? Side effects that you should report to your doctor or health care professional as soon as possible:  allergic reactions like skin rash, itching or hives, swelling of the face, lips, or tongue  breathing problems  confusion  elevated mood, decreased need for sleep, racing thoughts, impulsive behavior  fever or chills, sore throat  inability to keep still  new or increased gambling urges, sexual urges, uncontrolled spending, binge or compulsive eating, or other urges  problems with balance, talking, walking  seizures  signs and symptoms of high blood sugar such as being more thirsty or hungry or having to urinate more than normal. You may also feel very tired or have blurry  vision  signs and symptoms of low blood pressure like dizziness; feeling faint or lightheaded, falls; unusually weak or tired  signs and symptoms of neuroleptic malignant syndrome like confusion; fast or irregular heartbeat; high fever; increased sweating; stiff muscles  sudden numbness or weakness of the face, arm, or leg  suicidal thoughts or other mood changes  trouble swallowing  uncontrollable movements of the arms, face, head, mouth, neck, or upper body Side effects that usually do not require medical attention (report to your doctor or health care professional if they continue or are bothersome):  constipation  drowsiness  headache  stomach upset  weight gain This list may not describe all possible side effects. Call your doctor for medical advice about side effects. You may report side effects to FDA at 1-800-FDA-1088. Where should I keep my medicine? Keep out of the reach of children. Store at room temperature between 15 and 30 degrees C (59 and 86 degrees F). Throw away any unused medicine after the expiration date. NOTE: This sheet is a summary. It may not cover all possible information. If you have questions about this medicine, talk to your doctor, pharmacist, or health care provider.  2020 Elsevier/Gold Standard (2019-05-03 16:20:00)  
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
## 2020-07-24 ENCOUNTER — Ambulatory Visit: Payer: Medicare Other | Attending: Internal Medicine

## 2020-07-24 ENCOUNTER — Ambulatory Visit (INDEPENDENT_AMBULATORY_CARE_PROVIDER_SITE_OTHER): Payer: Self-pay | Admitting: Licensed Clinical Social Worker

## 2020-07-24 ENCOUNTER — Other Ambulatory Visit: Payer: Self-pay

## 2020-07-24 DIAGNOSIS — R2681 Unsteadiness on feet: Secondary | ICD-10-CM | POA: Insufficient documentation

## 2020-07-24 DIAGNOSIS — R1312 Dysphagia, oropharyngeal phase: Secondary | ICD-10-CM | POA: Diagnosis present

## 2020-07-24 DIAGNOSIS — M6281 Muscle weakness (generalized): Secondary | ICD-10-CM

## 2020-07-24 DIAGNOSIS — Z5329 Procedure and treatment not carried out because of patient's decision for other reasons: Secondary | ICD-10-CM

## 2020-07-24 NOTE — Progress Notes (Signed)
LCSW counselor tried to connect with patient for scheduled appointment via MyChart video text request x 2 and email request; also tried to connect via phone without success. LCSW counselor left message for patient to call office number to reschedule OPT appointment.
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
## 2020-07-25 ENCOUNTER — Telehealth: Payer: Self-pay

## 2020-07-25 ENCOUNTER — Inpatient Hospital Stay: Admission: RE | Admit: 2020-07-25 | Payer: 59 | Source: Ambulatory Visit

## 2020-07-25 NOTE — Telephone Encounter (Signed)
Medication management - Prior Authorization forms completed by Dr. Elna Breslow for patient's newly started Rexulti 0.5 mg per day and faxed back to [employer] for a determination.  PA pending approval. Copy sent to scan.

## 2020-07-26 ENCOUNTER — Ambulatory Visit: Payer: Medicare Other

## 2020-07-30 NOTE — Telephone Encounter (Signed)
Medication management - Left pt 2 messages this date to question if he had new insurance as his Rexulti was denied due to his [place] insurance coming back as elapsed 07/20/20.  Spoke to the pharmacist at patient's [facility] as they were not aware of him having a new insurance at this time.  Requested pt call our office back to let us know if he had a change in insurance as his Rexulti 0.5 mg order is denied coverage as this time due to elapsed insurance 07/20/20.

## 2020-07-31 ENCOUNTER — Other Ambulatory Visit: Payer: Self-pay

## 2020-07-31 ENCOUNTER — Ambulatory Visit: Payer: Medicare Other | Admitting: Physical Therapy

## 2020-07-31 VITALS — BP 154/100 | HR 88

## 2020-07-31 DIAGNOSIS — M6281 Muscle weakness (generalized): Secondary | ICD-10-CM | POA: Diagnosis not present

## 2020-07-31 DIAGNOSIS — R2681 Unsteadiness on feet: Secondary | ICD-10-CM

## 2020-07-31 NOTE — Telephone Encounter (Signed)
I have tried repeatedly to contact this patient with no results.  Will try this afternoon.
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
## 2020-08-01 NOTE — Telephone Encounter (Signed)
Thank you :)

## 2020-08-01 NOTE — Telephone Encounter (Signed)
Reached patient.  Patient stated he is having problems with his insurance and will notify us and the pharmacy when the problem is resolved.

## 2020-08-02 ENCOUNTER — Encounter: Payer: Self-pay | Admitting: Internal Medicine

## 2020-08-02 ENCOUNTER — Ambulatory Visit (INDEPENDENT_AMBULATORY_CARE_PROVIDER_SITE_OTHER): Payer: Medicare Other | Admitting: Internal Medicine

## 2020-08-02 ENCOUNTER — Other Ambulatory Visit: Payer: Self-pay

## 2020-08-02 VITALS — BP 110/78 | HR 75 | Temp 97.5°F | Ht 69.0 in | Wt 318.4 lb

## 2020-08-02 DIAGNOSIS — F339 Major depressive disorder, recurrent, unspecified: Secondary | ICD-10-CM

## 2020-08-02 DIAGNOSIS — G43C1 Periodic headache syndromes in child or adult, intractable: Secondary | ICD-10-CM

## 2020-08-02 DIAGNOSIS — R110 Nausea: Secondary | ICD-10-CM

## 2020-08-02 DIAGNOSIS — I10 Essential (primary) hypertension: Secondary | ICD-10-CM

## 2020-08-02 DIAGNOSIS — G4700 Insomnia, unspecified: Secondary | ICD-10-CM

## 2020-08-02 DIAGNOSIS — Z9989 Dependence on other enabling machines and devices: Secondary | ICD-10-CM

## 2020-08-02 DIAGNOSIS — Z85528 Personal history of other malignant neoplasm of kidney: Secondary | ICD-10-CM

## 2020-08-02 DIAGNOSIS — Z6841 Body Mass Index (BMI) 40.0 and over, adult: Secondary | ICD-10-CM

## 2020-08-02 DIAGNOSIS — I701 Atherosclerosis of renal artery: Secondary | ICD-10-CM

## 2020-08-02 DIAGNOSIS — G4719 Other hypersomnia: Secondary | ICD-10-CM

## 2020-08-02 DIAGNOSIS — R030 Elevated blood-pressure reading, without diagnosis of hypertension: Secondary | ICD-10-CM

## 2020-08-02 DIAGNOSIS — G4733 Obstructive sleep apnea (adult) (pediatric): Secondary | ICD-10-CM

## 2020-08-02 NOTE — Progress Notes (Signed)
Will review with patient.
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
## 2020-08-02 NOTE — Patient Instructions (Addendum)
cetaphil or cerave or vanicream to [employer]   Red bump keratosis pilaris use amlactin lotion or a sponge to scrub   Discuss migraines with Dr. Hardin Negus if he does not treat rec neurology  Magnesium 250 mg daily for prevention    Migraine Headache A migraine headache is an intense, throbbing pain on one side or both sides of the head. Migraine headaches may also cause other symptoms, such as nausea, vomiting, and sensitivity to light and noise. A migraine headache can last from 4 hours to 3 days. Talk with your doctor about what things may bring on (trigger) your migraine headaches. What are the causes? The exact cause of this condition is not known. However, a migraine may be caused when nerves in the brain become irritated and release chemicals that cause inflammation of blood vessels. This inflammation causes pain. This condition may be triggered or caused by:  Drinking alcohol.  Smoking.  Taking medicines, such as: ? Medicine used to treat chest pain (nitroglycerin). ? Birth control pills. ? Estrogen. ? Certain blood pressure medicines.  Eating or drinking products that contain nitrates, glutamate, aspartame, or tyramine. Aged cheeses, chocolate, or caffeine may also be triggers.  Doing physical activity. Other things that may trigger a migraine headache include:  Menstruation.  Pregnancy.  Hunger.  Stress.  Lack of sleep or too much sleep.  Weather changes.  Fatigue. What increases the risk? The following factors may make you more likely to experience migraine headaches:  Being a certain age. This condition is more common in people who are 64-78 years old.  Being male.  Having a family history of migraine headaches.  Being Caucasian.  Having a mental health condition, such as depression or anxiety.  Being obese. What are the signs or symptoms? The main symptom of this condition is pulsating or throbbing pain. This pain may:  Happen in  any area of the head, such as on one side or both sides.  Interfere with daily activities.  Get worse with physical activity.  Get worse with exposure to bright lights or loud noises. Other symptoms may include:  Nausea.  Vomiting.  Dizziness.  General sensitivity to bright lights, loud noises, or smells. Before you get a migraine headache, you may get warning signs (an aura). An aura may include:  Seeing flashing lights or having blind spots.  Seeing bright spots, halos, or zigzag lines.  Having tunnel vision or blurred vision.  Having numbness or a tingling feeling.  Having trouble talking.  Having muscle weakness. Some people have symptoms after a migraine headache (postdromal phase), such as:  Feeling tired.  Difficulty concentrating. How is this diagnosed? A migraine headache can be diagnosed based on:  Your symptoms.  A physical exam.  Tests, such as: ? CT scan or an MRI of the head. These imaging tests can help rule out other causes of headaches. ? Taking fluid from the spine (lumbar puncture) and analyzing it (cerebrospinal fluid analysis, or CSF analysis). How is this treated? This condition may be treated with medicines that:  Relieve pain.  Relieve nausea.  Prevent migraine headaches. Treatment for this condition may also include:  Acupuncture.  Lifestyle changes like avoiding foods that trigger migraine headaches.  Biofeedback.  Cognitive behavioral therapy. Follow these instructions at home: Medicines  Take over-the-counter and prescription medicines only as told by your health care provider.  Ask your health care provider if the medicine prescribed to you: ? Requires you to avoid driving or using  heavy machinery. ? Can cause constipation. You may need to take these actions to prevent or treat constipation:  Drink enough fluid to keep your urine pale yellow.  Take over-the-counter or prescription medicines.  Eat foods that are  high in fiber, such as beans, whole grains, and fresh fruits and vegetables.  Limit foods that are high in fat and processed sugars, such as fried or sweet foods. Lifestyle  Do not drink alcohol.  Do not use any products that contain nicotine or tobacco, such as cigarettes, e-cigarettes, and chewing tobacco. If you need help quitting, ask your health care provider.  Get at least 8 hours of sleep every night.  Find ways to manage stress, such as meditation, deep breathing, or yoga. General instructions  Keep a journal to find out what may trigger your migraine headaches. For example, write down: ? What you eat and drink. ? How much sleep you get. ? Any change to your diet or medicines.  If you have a migraine headache: ? Avoid things that make your symptoms worse, such as bright lights. ? It may help to lie down in a dark, quiet room. ? Do not drive or use heavy machinery. ? Ask your health care provider what activities are safe for you while you are experiencing symptoms.  Keep all follow-up visits as told by your health care provider. This is important.      Contact a health care provider if:  You develop symptoms that are different or more severe than your usual migraine headache symptoms.  You have more than 15 headache days in one month. Get help right away if:  Your migraine headache becomes severe.  Your migraine headache lasts longer than 72 hours.  You have a fever.  You have a stiff neck.  You have vision loss.  Your muscles feel weak or like you cannot control them.  You start to lose your balance often.  You have trouble walking.  You faint.  You have a seizure. Summary  A migraine headache is an intense, throbbing pain on one side or both sides of the head. Migraines may also cause other symptoms, such as nausea, vomiting, and sensitivity to light and noise.  This condition may be treated with medicines and lifestyle changes. You may also need to  avoid certain things that trigger a migraine headache.  Keep a journal to find out what may trigger your migraine headaches.  Contact your health care provider if you have more than 15 headache days in a month or you develop symptoms that are different or more severe than your usual migraine headache symptoms. This information is not intended to replace advice given to you by your health care provider. Make sure you discuss any questions you have with your health care provider. Document Revised: 10/29/2018 Document Reviewed: 08/19/2018 Elsevier Patient Education  2021 [place].  Budget-Friendly Healthy Eating There are many ways to save money at the grocery store and continue to eat healthy. You can be successful if you:  Plan meals according to your budget.  Make a grocery list and only purchase food according to your grocery list.  Prepare food yourself at home. What are tips for following this plan? Reading food labels  Compare food labels between brand name foods and the store brand. Often the nutritional value is the same, but the store brand is lower cost.  Look for products that do not have added sugar, fat, or salt (sodium). These often cost the same but  are healthier for you. Products may be labeled as: ? Sugar-free. ? Nonfat. ? Low-fat. ? Sodium-free. ? Low-sodium.  Look for lean ground beef labeled as at least 92% lean and 8% fat. Shopping  Buy only the items on your grocery list and go only to the areas of the store that have the items on your list.  Use coupons only for foods and brands you normally buy. Avoid buying items you wouldn't normally buy simply because they are on sale.  Check online and in newspapers for weekly deals.  Buy healthy items from the bulk bins when available, such as herbs, spices, flour, pasta, nuts, and dried fruit.  Buy fruits and vegetables that are in season. Prices are usually lower on in-season produce.  Look at the unit price  on the price tag. Use it to compare different brands and sizes to find out which item is the best deal.  Choose healthy items that are often low-cost, such as carrots, potatoes, apples, bananas, and oranges. Dried or canned beans are a low-cost protein source.  Buy in bulk and freeze extra food. Items you can buy in bulk include meats, fish, poultry, frozen fruits, and frozen vegetables.  Avoid buying "ready-to-eat" foods, such as pre-cut fruits and vegetables and pre-made salads.  If possible, shop around to discover where you can find the best prices. Consider other retailers such as dollar stores, larger [employer], local fruit and vegetable stands, and farmers markets.  Do not shop when you are hungry. If you shop while hungry, it may be hard to stick to your list and budget.  Resist impulse buying. Use your grocery list as your official plan for the week.  Buy a variety of vegetables and fruits by purchasing fresh, frozen, and canned items.  Look at the top and bottom shelves for deals. Foods at eye level (eye level of an adult or child) are usually more expensive.  Be efficient with your time when shopping. The more time you spend at the store, the more money you are likely to spend.  To save money when choosing more expensive foods like meats and dairy: ? Choose cheaper cuts of meat, such as bone-in chicken thighs and drumsticks instead of skinless and boneless chicken. When you are ready to prepare the chicken, you can remove the skin yourself to make it healthier. ? Choose lean meats like chicken or [place] instead of beef. ? Choose canned seafood, such as tuna, salmon, or sardines. ? Buy eggs as a low-cost source of protein. ? Buy dried beans and peas, such as lentils, split peas, or kidney beans instead of meats. Dried beans and peas are a good alternative source of protein. ? Buy the larger tubs of yogurt instead of individual-sized containers.  Choose water instead of  sodas and other sweetened beverages.  Avoid buying chips, cookies, and other "junk food." These items are usually expensive and not healthy.   Cooking  Make extra food and freeze the extras in meal-sized containers or in individual portions for fast meals and snacks.  Pre-cook on days when you have extra time to prepare meals in advance. You can keep these meals in the fridge or freezer and reheat for a quick meal.  When you come home from the grocery store, wash, peel, and cut fruits and vegetables so they are ready to use and eat. This will help reduce food waste. Meal planning  Do not eat out or get fast food. Prepare food at home.  Make a grocery list and make sure to bring it with you to the store. If you have a smart phone, you could use your phone to create your shopping list.  Plan meals and snacks according to a grocery list and budget you create.  Use leftovers in your meal plan for the week.  Look for recipes where you can cook once and make enough food for two meals.  Prepare budget-friendly types of meals like stews, casseroles, and stir-fry dishes.  Try some meatless meals or try "no cook" meals like salads.  Make sure that half your plate is filled with fruits or vegetables. Choose from fresh, frozen, or canned fruits and vegetables. If eating canned, remember to rinse them before eating. This will remove any excess salt added for packaging. Summary  Eating healthy on a budget is possible if you plan your meals according to your budget, purchase according to your budget and grocery list, and prepare food yourself.  Tips for buying more food on a limited budget include buying generic brands, using coupons only for foods you normally buy, and buying healthy items from the bulk bins when available.  Tips for buying cheaper food to replace expensive food include choosing cheaper, lean cuts of meat, and buying dried beans and peas. This information is not intended to  replace advice given to you by your health care provider. Make sure you discuss any questions you have with your health care provider. Document Revised: 04/19/2020 Document Reviewed: 04/19/2020 Elsevier Patient Education  2021 [place] injection solution What is this medicine? SEMAGLUTIDE (Sem a GLOO tide) is used to improve blood sugar control in adults with type 2 diabetes. This medicine may be used with other diabetes medicines. This drug may also reduce the risk of heart attack or stroke if you have type 2 diabetes and risk factors for heart disease. This medicine may be used for other purposes; ask your health care provider or pharmacist if you have questions. COMMON BRAND NAME(S): OZEMPIC What should I tell my health care provider before I take this medicine? They need to know if you have any of these conditions:  endocrine tumors (MEN 2) or if someone in your family had these tumors  eye disease, vision problems  history of pancreatitis  kidney disease  stomach problems  thyroid cancer or if someone in your family had thyroid cancer  an unusual or allergic reaction to semaglutide, other medicines, foods, dyes, or preservatives  pregnant or trying to get pregnant  breast-feeding How should I use this medicine? This medicine is for injection under the skin of your upper leg (thigh), stomach area, or upper arm. It is given once every week (every 7 days). You will be taught how to prepare and give this medicine. Use exactly as directed. Take your medicine at regular intervals. Do not take it more often than directed. If you use this medicine with insulin, you should inject this medicine and the insulin separately. Do not mix them together. Do not give the injections right next to each other. Change (rotate) injection sites with each injection. It is important that you put your used needles and syringes in a special sharps container. Do not put them in a trash  can. If you do not have a sharps container, call your pharmacist or healthcare provider to get one. A special MedGuide will be given to you by the pharmacist with each prescription and refill. Be sure to read this information carefully each time.  This drug comes with INSTRUCTIONS FOR USE. Ask your pharmacist for directions on how to use this drug. Read the information carefully. Talk to your pharmacist or health care provider if you have questions. Talk to your pediatrician regarding the use of this medicine in children. Special care may be needed. Overdosage: If you think you have taken too much of this medicine contact a poison control center or emergency room at once. NOTE: This medicine is only for you. Do not share this medicine with others. What if I miss a dose? If you miss a dose, take it as soon as you can within 5 days after the missed dose. Then take your next dose at your regular weekly time. If it has been longer than 5 days after the missed dose, do not take the missed dose. Take the next dose at your regular time. Do not take double or extra doses. If you have questions about a missed dose, contact your health care provider for advice. What may interact with this medicine?  other medicines for diabetes Many medications may cause changes in blood sugar, these include:  alcohol containing beverages  antiviral medicines for HIV or AIDS  aspirin and aspirin-like drugs  certain medicines for blood pressure, heart disease, irregular heart beat  chromium  diuretics  male hormones, such as estrogens or progestins, birth control pills  fenofibrate  gemfibrozil  isoniazid  lanreotide  male hormones or anabolic steroids  MAOIs like Carbex, Eldepryl, Marplan, Nardil, and Parnate  medicines for weight loss  medicines for allergies, asthma, cold, or cough  medicines for depression, anxiety, or psychotic disturbances  niacin  nicotine  NSAIDs, medicines for pain and  inflammation, like ibuprofen or naproxen  octreotide  pasireotide  pentamidine  phenytoin  probenecid  quinolone antibiotics such as ciprofloxacin, levofloxacin, ofloxacin  some herbal dietary supplements  steroid medicines such as prednisone or cortisone  sulfamethoxazole; trimethoprim  thyroid hormones Some medications can hide the warning symptoms of low blood sugar (hypoglycemia). You may need to monitor your blood sugar more closely if you are taking one of these medications. These include:  beta-blockers, often used for high blood pressure or heart problems (examples include atenolol, metoprolol, propranolol)  clonidine  guanethidine  reserpine This list may not describe all possible interactions. Give your health care provider a list of all the medicines, herbs, non-prescription drugs, or dietary supplements you use. Also tell them if you smoke, drink alcohol, or use illegal drugs. Some items may interact with your medicine. What should I watch for while using this medicine? Visit your doctor or health care professional for regular checks on your progress. Drink plenty of fluids while taking this medicine. Check with your doctor or health care professional if you get an attack of severe diarrhea, nausea, and vomiting. The loss of too much body fluid can make it dangerous for you to take this medicine. A test called the HbA1C (A1C) will be monitored. This is a simple blood test. It measures your blood sugar control over the last 2 to 3 months. You will receive this test every 3 to 6 months. Learn how to check your blood sugar. Learn the symptoms of low and high blood sugar and how to manage them. Always carry a quick-source of sugar with you in case you have symptoms of low blood sugar. Examples include hard sugar candy or glucose tablets. Make sure others know that you can choke if you eat or drink when you develop serious symptoms of low  blood sugar, such as seizures or  unconsciousness. They must get medical help at once. Tell your doctor or health care professional if you have high blood sugar. You might need to change the dose of your medicine. If you are sick or exercising more than usual, you might need to change the dose of your medicine. Do not skip meals. Ask your doctor or health care professional if you should avoid alcohol. Many nonprescription cough and cold products contain sugar or alcohol. These can affect blood sugar. Pens should never be shared. Even if the needle is changed, sharing may result in passing of viruses like hepatitis or HIV. Wear a medical ID bracelet or chain, and carry a card that describes your disease and details of your medicine and dosage times. Do not become pregnant while taking this medicine. Women should inform their doctor if they wish to become pregnant or think they might be pregnant. There is a potential for serious side effects to an unborn child. Talk to your health care professional or pharmacist for more information. What side effects may I notice from receiving this medicine? Side effects that you should report to your doctor or health care professional as soon as possible:  allergic reactions like skin rash, itching or hives, swelling of the face, lips, or tongue  breathing problems  changes in vision  diarrhea that continues or is severe  lump or swelling on the neck  severe nausea  signs and symptoms of infection like fever or chills; cough; sore throat; pain or trouble passing urine  signs and symptoms of low blood sugar such as feeling anxious, confusion, dizziness, increased hunger, unusually weak or tired, sweating, shakiness, cold, irritable, headache, blurred vision, fast heartbeat, loss of consciousness  signs and symptoms of kidney injury like trouble passing urine or change in the amount of urine  trouble swallowing  unusual stomach upset or pain  vomiting Side effects that usually do not  require medical attention (report to your doctor or health care professional if they continue or are bothersome):  constipation  diarrhea  nausea  pain, redness, or irritation at site where injected  stomach upset This list may not describe all possible side effects. Call your doctor for medical advice about side effects. You may report side effects to FDA at 1-800-FDA-1088. Where should I keep my medicine? Keep out of the reach of children. Store unopened pens in a refrigerator between 2 and 8 degrees C (36 and 46 degrees F). Do not freeze. Protect from light and heat. After you first use the pen, it can be stored for 56 days at room temperature between 15 and 30 degrees C (59 and 86 degrees F) or in a refrigerator. Throw away your used pen after 56 days or after the expiration date, whichever comes first. Do not store your pen with the needle attached. If the needle is left on, medicine may leak from the pen. NOTE: This sheet is a summary. It may not cover all possible information. If you have questions about this medicine, talk to your doctor, pharmacist, or health care provider.  2021 Elsevier/Gold Standard (2019-03-22 09:41:51)   Cooking With Less Salt Cooking with less salt is one way to reduce the amount of sodium you get from food. Sodium is one of the elements that make up salt. It is found naturally in foods and is also added to certain foods. Depending on your condition and overall health, your health care provider or dietitian may recommend that you  reduce your sodium intake. Most people should have less than 2,300 milligrams (mg) of sodium each day. If you have high blood pressure (hypertension), you may need to limit your sodium to 1,500 mg each day. Follow the tips below to help reduce your sodium intake. What are tips for eating less sodium? Reading food labels  Check the food label before buying or using packaged ingredients. Always check the label for the serving size and  sodium content.  Look for products with no more than 140 mg of sodium in one serving.  Check the % Daily Value column to see what percent of the daily recommended amount of sodium is provided in one serving of the product. Foods with 5% or less in this column are considered low in sodium. Foods with 20% or higher are considered high in sodium.  Do not choose foods with salt as one of the first three ingredients on the ingredients list. If salt is one of the first three ingredients, it usually means the item is high in sodium.   Shopping  Buy sodium-free or low-sodium products. Look for the following words on food labels: ? Low-sodium. ? Sodium-free. ? Reduced-sodium. ? No salt added. ? Unsalted.  Always check the sodium content even if foods are labeled as low-sodium or no salt added.  Buy fresh foods. Cooking  Use herbs, seasonings without salt, and spices as substitutes for salt.  Use sodium-free baking soda when baking.  Grill, braise, or roast foods to add flavor with less salt.  Avoid adding salt to pasta, rice, or hot cereals.  Drain and rinse canned vegetables, beans, and meat before use.  Avoid adding salt when cooking sweets and desserts.  Cook with low-sodium ingredients. What foods are high in sodium? Vegetables Regular canned vegetables (not low-sodium or reduced-sodium). Sauerkraut, pickled vegetables, and relishes. Olives. [place] fries. Onion rings. Regular canned tomato sauce and paste. Regular tomato and vegetable juice. Frozen vegetables in sauces. Grains Instant hot cereals. Bread stuffing, pancake, and biscuit mixes. Croutons. Seasoned rice or pasta mixes. Noodle soup cups. Boxed or frozen macaroni and cheese. Regular salted crackers. Self-rising flour. Rolls. Bagels. Flour tortillas and wraps. Meats and other proteins Meat or fish that is salted, canned, smoked, cured, spiced, or pickled. This includes bacon, ham, sausages, hot dogs, corned beef, chipped  beef, meat loaves, salt pork, jerky, pickled herring, anchovies, regular canned tuna, and sardines. Salted nuts. Dairy Processed cheese and cheese spreads. Cheese curds. Blue cheese. Feta cheese. String cheese. Regular cottage cheese. Buttermilk. Canned milk. The items listed above may not be a complete list of foods high in sodium. Actual amounts of sodium may be different depending on processing. Contact a dietitian for more information. What foods are low in sodium? Fruits Fresh, frozen, or canned fruit with no sauce added. Fruit juice. Vegetables Fresh or frozen vegetables with no sauce added. "No salt added" canned vegetables. "No salt added" tomato sauce and paste. Low-sodium or reduced-sodium tomato and vegetable juice. Grains Noodles, pasta, quinoa, rice. Shredded or puffed wheat or puffed rice. Regular or quick oats (not instant). Low-sodium crackers. Low-sodium bread. Whole-grain bread and whole-grain pasta. Unsalted popcorn. Meats and other proteins Fresh or frozen whole meats, poultry (not injected with sodium), and fish with no sauce added. Unsalted nuts. Dried peas, beans, and lentils without added salt. Unsalted canned beans. Eggs. Unsalted nut butters. Low-sodium canned tuna or chicken. Dairy Milk. Soy milk. Yogurt. Low-sodium cheeses, such as Swiss, Monterey Jack, [place], and [employer]. Sherbet or ice  cream (keep to  cup per serving). Cream cheese. Fats and oils Unsalted butter or margarine. Other foods Homemade pudding. Sodium-free baking soda and baking powder. Herbs and spices. Low-sodium seasoning mixes. Beverages Coffee and tea. Carbonated beverages. The items listed above may not be a complete list of foods low in sodium. Actual amounts of sodium may be different depending on processing. Contact a dietitian for more information. What are some salt alternatives when cooking? The following are herbs, seasonings, and spices that can be used instead of salt to flavor your  food. Herbs should be fresh or dried. Do not choose packaged mixes. Next to the name of the herb, spice, or seasoning are some examples of foods you can pair it with. Herbs  Bay leaves - Soups, meat and vegetable dishes, and spaghetti sauce.  Basil - [employer], soups, pasta, and fish dishes.  Cilantro - Meat, poultry, and vegetable dishes.  Chili powder - Marinades and Mexican dishes.  Chives - Salad dressings and potato dishes.  Cumin - Mexican dishes, couscous, and meat dishes.  Dill - Fish dishes, sauces, and salads.  Fennel - Meat and vegetable dishes, breads, and cookies.  Garlic (do not use garlic salt) - [place] dishes, meat dishes, salad dressings, and sauces.  Marjoram - Soups, potato dishes, and meat dishes.  Oregano - Pizza and spaghetti sauce.  Parsley - Salads, soups, pasta, and meat dishes.  Rosemary - [place] dishes, salad dressings, soups, and red meats.  Saffron - Fish dishes, pasta, and some poultry dishes.  Sage - Stuffings and sauces.  Tarragon - Fish and [employer].  Thyme - Stuffing, meat, and fish dishes. Seasonings  Lemon juice - Fish dishes, poultry dishes, vegetables, and salads.  Vinegar - Salad dressings, vegetables, and fish dishes. Spices  Cinnamon - Sweet dishes, such as cakes, cookies, and puddings.  Cloves - Gingerbread, puddings, and marinades for meats.  Curry - Vegetable dishes, fish and poultry dishes, and stir-fry dishes.  Ginger - Vegetable dishes, fish dishes, and stir-fry dishes.  Nutmeg - Pasta, vegetables, poultry, fish dishes, and custard. Summary  Cooking with less salt is one way to reduce the amount of sodium that you get from food.  Buy sodium-free or low-sodium products.  Check the food label before using or buying packaged ingredients.  Use herbs, seasonings without salt, and spices as substitutes for salt in foods. This information is not intended to replace advice given to you by your health care  provider. Make sure you discuss any questions you have with your health care provider. Document Revised: 06/29/2019 Document Reviewed: 06/29/2019 Elsevier Patient Education  2021 [employer] Eating Plan DASH stands for Dietary Approaches to Stop Hypertension. The DASH eating plan is a healthy eating plan that has been shown to:  Reduce high blood pressure (hypertension).  Reduce your risk for type 2 diabetes, heart disease, and stroke.  Help with weight loss. What are tips for following this plan? Reading food labels  Check food labels for the amount of salt (sodium) per serving. Choose foods with less than 5 percent of the Daily Value of sodium. Generally, foods with less than 300 milligrams (mg) of sodium per serving fit into this eating plan.  To find whole grains, look for the word "whole" as the first word in the ingredient list. Shopping  Buy products labeled as "low-sodium" or "no salt added."  Buy fresh foods. Avoid canned foods and pre-made or frozen meals. Cooking  Avoid adding salt when cooking.  Use salt-free seasonings or herbs instead of table salt or sea salt. Check with your health care provider or pharmacist before using salt substitutes.  Do not fry foods. Cook foods using healthy methods such as baking, boiling, grilling, roasting, and broiling instead.  Cook with heart-healthy oils, such as olive, canola, avocado, soybean, or sunflower oil. Meal planning  Eat a balanced diet that includes: ? 4 or more servings of fruits and 4 or more servings of vegetables each day. Try to fill one-half of your plate with fruits and vegetables. ? 6-8 servings of whole grains each day. ? Less than 6 oz (170 g) of lean meat, poultry, or fish each day. A 3-oz (85-g) serving of meat is about the same size as a deck of cards. One egg equals 1 oz (28 g). ? 2-3 servings of low-fat dairy each day. One serving is 1 cup (237 mL). ? 1 serving of nuts, seeds, or beans 5 times each  week. ? 2-3 servings of heart-healthy fats. Healthy fats called omega-3 fatty acids are found in foods such as walnuts, flaxseeds, fortified milks, and eggs. These fats are also found in cold-water fish, such as sardines, salmon, and mackerel.  Limit how much you eat of: ? Canned or prepackaged foods. ? Food that is high in trans fat, such as some fried foods. ? Food that is high in saturated fat, such as fatty meat. ? Desserts and other sweets, sugary drinks, and other foods with added sugar. ? Full-fat dairy products.  Do not salt foods before eating.  Do not eat more than 4 egg yolks a week.  Try to eat at least 2 vegetarian meals a week.  Eat more home-cooked food and less restaurant, buffet, and fast food.   Lifestyle  When eating at a restaurant, ask that your food be prepared with less salt or no salt, if possible.  If you drink alcohol: ? Limit how much you use to:  0-1 drink a day for women who are not pregnant.  0-2 drinks a day for men. ? Be aware of how much alcohol is in your drink. In the [place] one drink equals one 12 oz bottle of beer (355 mL), one 5 oz glass of wine (148 mL), or one 1 oz glass of hard liquor (44 mL). General information  Avoid eating more than 2,300 mg of salt a day. If you have hypertension, you may need to reduce your sodium intake to 1,500 mg a day.  Work with your health care provider to maintain a healthy body weight or to lose weight. Ask what an ideal weight is for you.  Get at least 30 minutes of exercise that causes your heart to beat faster (aerobic exercise) most days of the week. Activities may include walking, swimming, or biking.  Work with your health care provider or dietitian to adjust your eating plan to your individual calorie needs. What foods should I eat? Fruits All fresh, dried, or frozen fruit. Canned fruit in natural juice (without added sugar). Vegetables Fresh or frozen vegetables (raw, steamed, roasted, or  grilled). Low-sodium or reduced-sodium tomato and vegetable juice. Low-sodium or reduced-sodium tomato sauce and tomato paste. Low-sodium or reduced-sodium canned vegetables. Grains Whole-grain or whole-wheat bread. Whole-grain or whole-wheat pasta. Brown rice. [place]. Bulgur. Whole-grain and low-sodium cereals. Pita bread. Low-fat, low-sodium crackers. Whole-wheat flour tortillas. Meats and other proteins Skinless chicken or [place]. Ground chicken or [place]. Pork with fat trimmed off. Fish and seafood. Egg whites. Dried beans,  peas, or lentils. Unsalted nuts, nut butters, and seeds. Unsalted canned beans. Lean cuts of beef with fat trimmed off. Low-sodium, lean precooked or cured meat, such as sausages or meat loaves. Dairy Low-fat (1%) or fat-free (skim) milk. Reduced-fat, low-fat, or fat-free cheeses. Nonfat, low-sodium ricotta or cottage cheese. Low-fat or nonfat yogurt. Low-fat, low-sodium cheese. Fats and oils Soft margarine without trans fats. Vegetable oil. Reduced-fat, low-fat, or light mayonnaise and salad dressings (reduced-sodium). Canola, safflower, olive, avocado, soybean, and sunflower oils. Avocado. Seasonings and condiments Herbs. Spices. Seasoning mixes without salt. Other foods Unsalted popcorn and pretzels. Fat-free sweets. The items listed above may not be a complete list of foods and beverages you can eat. Contact a dietitian for more information. What foods should I avoid? Fruits Canned fruit in a light or heavy syrup. Fried fruit. Fruit in cream or butter sauce. Vegetables Creamed or fried vegetables. Vegetables in a cheese sauce. Regular canned vegetables (not low-sodium or reduced-sodium). Regular canned tomato sauce and paste (not low-sodium or reduced-sodium). Regular tomato and vegetable juice (not low-sodium or reduced-sodium). Angie Fava. Olives. Grains Baked goods made with fat, such as croissants, muffins, or some breads. Dry pasta or rice meal packs. Meats  and other proteins Fatty cuts of meat. Ribs. Fried meat. Berniece Salines. Bologna, salami, and other precooked or cured meats, such as sausages or meat loaves. Fat from the back of a pig (fatback). Bratwurst. Salted nuts and seeds. Canned beans with added salt. Canned or smoked fish. Whole eggs or egg yolks. Chicken or [place] with skin. Dairy Whole or 2% milk, cream, and half-and-half. Whole or full-fat cream cheese. Whole-fat or sweetened yogurt. Full-fat cheese. Nondairy creamers. Whipped toppings. Processed cheese and cheese spreads. Fats and oils Butter. Stick margarine. Lard. Shortening. Ghee. Bacon fat. Tropical oils, such as coconut, palm kernel, or palm oil. Seasonings and condiments Onion salt, garlic salt, seasoned salt, table salt, and sea salt. Worcestershire sauce. Tartar sauce. Barbecue sauce. Teriyaki sauce. Soy sauce, including reduced-sodium. Steak sauce. Canned and packaged gravies. Fish sauce. Oyster sauce. Cocktail sauce. Store-bought horseradish. Ketchup. Mustard. Meat flavorings and tenderizers. Bouillon cubes. Hot sauces. Pre-made or packaged marinades. Pre-made or packaged taco seasonings. Relishes. Regular salad dressings. Other foods Salted popcorn and pretzels. The items listed above may not be a complete list of foods and beverages you should avoid. Contact a dietitian for more information. Where to find more information  National Heart, Lung, and Blood Institute: https://wilson-eaton.com/  American Heart Association: www.heart.org  Academy of Nutrition and Dietetics: www.eatright.[place]: www.kidney.org Summary  The DASH eating plan is a healthy eating plan that has been shown to reduce high blood pressure (hypertension). It may also reduce your risk for type 2 diabetes, heart disease, and stroke.  When on the DASH eating plan, aim to eat more fresh fruits and vegetables, whole grains, lean proteins, low-fat dairy, and heart-healthy fats.  With the DASH  eating plan, you should limit salt (sodium) intake to 2,300 mg a day. If you have hypertension, you may need to reduce your sodium intake to 1,500 mg a day.  Work with your health care provider or dietitian to adjust your eating plan to your individual calorie needs. This information is not intended to replace advice given to you by your health care provider. Make sure you discuss any questions you have with your health care provider. Document Revised: 06/10/2019 Document Reviewed: 06/10/2019 Elsevier Patient Education  2021 [employer].

## 2020-08-03 ENCOUNTER — Telehealth (INDEPENDENT_AMBULATORY_CARE_PROVIDER_SITE_OTHER): Payer: Medicare Other | Admitting: Psychiatry

## 2020-08-03 ENCOUNTER — Encounter: Payer: Self-pay | Admitting: Psychiatry

## 2020-08-03 DIAGNOSIS — F1611 Hallucinogen abuse, in remission: Secondary | ICD-10-CM

## 2020-08-03 DIAGNOSIS — F1021 Alcohol dependence, in remission: Secondary | ICD-10-CM | POA: Diagnosis not present

## 2020-08-03 DIAGNOSIS — G4701 Insomnia due to medical condition: Secondary | ICD-10-CM

## 2020-08-03 DIAGNOSIS — F1221 Cannabis dependence, in remission: Secondary | ICD-10-CM

## 2020-08-03 DIAGNOSIS — F3181 Bipolar II disorder: Secondary | ICD-10-CM

## 2020-08-03 DIAGNOSIS — F410 Panic disorder [episodic paroxysmal anxiety] without agoraphobia: Secondary | ICD-10-CM

## 2020-08-03 DIAGNOSIS — F1411 Cocaine abuse, in remission: Secondary | ICD-10-CM

## 2020-08-03 MED ORDER — LAMOTRIGINE 100 MG PO TABS
150.0000 mg | ORAL_TABLET | ORAL | 0 refills | Status: DC
Start: 2020-08-03 — End: 2020-09-11
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
## 2020-08-07 ENCOUNTER — Ambulatory Visit: Payer: 59 | Admitting: Physical Therapy

## 2020-08-09 ENCOUNTER — Ambulatory Visit: Payer: Medicare Other

## 2020-08-10 ENCOUNTER — Other Ambulatory Visit: Payer: Self-pay

## 2020-08-10 ENCOUNTER — Ambulatory Visit (INDEPENDENT_AMBULATORY_CARE_PROVIDER_SITE_OTHER): Payer: Medicare Other

## 2020-08-10 DIAGNOSIS — R030 Elevated blood-pressure reading, without diagnosis of hypertension: Secondary | ICD-10-CM

## 2020-08-10 DIAGNOSIS — I701 Atherosclerosis of renal artery: Secondary | ICD-10-CM | POA: Diagnosis not present

## 2020-08-10 DIAGNOSIS — I10 Essential (primary) hypertension: Secondary | ICD-10-CM | POA: Diagnosis not present

## 2020-08-13 ENCOUNTER — Ambulatory Visit: Payer: Medicare Other | Admitting: Physical Therapy

## 2020-08-13 ENCOUNTER — Other Ambulatory Visit: Payer: Self-pay

## 2020-08-13 DIAGNOSIS — R2681 Unsteadiness on feet: Secondary | ICD-10-CM

## 2020-08-13 DIAGNOSIS — M6281 Muscle weakness (generalized): Secondary | ICD-10-CM

## 2020-08-13 DIAGNOSIS — R1312 Dysphagia, oropharyngeal phase: Secondary | ICD-10-CM
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
## 2020-08-15 ENCOUNTER — Ambulatory Visit: Payer: Medicare Other

## 2020-08-16 ENCOUNTER — Telehealth: Payer: 59 | Admitting: Psychiatry

## 2020-08-20 ENCOUNTER — Ambulatory Visit: Payer: Medicare Other

## 2020-08-22 ENCOUNTER — Ambulatory Visit (INDEPENDENT_AMBULATORY_CARE_PROVIDER_SITE_OTHER): Payer: Medicare Other | Admitting: Licensed Clinical Social Worker

## 2020-08-22 ENCOUNTER — Other Ambulatory Visit: Payer: Self-pay

## 2020-08-22 DIAGNOSIS — F3181 Bipolar II disorder: Secondary | ICD-10-CM | POA: Diagnosis not present

## 2020-08-22 DIAGNOSIS — F410 Panic disorder [episodic paroxysmal anxiety] without agoraphobia: Secondary | ICD-10-CM | POA: Diagnosis not present
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
## 2020-08-23 ENCOUNTER — Ambulatory Visit: Payer: Medicare Other | Attending: Internal Medicine | Admitting: Physical Therapy

## 2020-08-23 ENCOUNTER — Other Ambulatory Visit: Payer: Self-pay

## 2020-08-23 DIAGNOSIS — M6281 Muscle weakness (generalized): Secondary | ICD-10-CM | POA: Insufficient documentation

## 2020-08-23 DIAGNOSIS — R2681 Unsteadiness on feet: Secondary | ICD-10-CM | POA: Diagnosis present
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
## 2020-08-27 ENCOUNTER — Ambulatory Visit: Payer: Medicare Other

## 2020-08-27 ENCOUNTER — Telehealth (INDEPENDENT_AMBULATORY_CARE_PROVIDER_SITE_OTHER): Payer: Medicare Other | Admitting: Psychiatry

## 2020-08-27 ENCOUNTER — Other Ambulatory Visit: Payer: Self-pay

## 2020-08-27 ENCOUNTER — Encounter: Payer: Self-pay | Admitting: Psychiatry

## 2020-08-27 DIAGNOSIS — F1411 Cocaine abuse, in remission: Secondary | ICD-10-CM

## 2020-08-27 DIAGNOSIS — F1021 Alcohol dependence, in remission: Secondary | ICD-10-CM | POA: Diagnosis not present

## 2020-08-27 DIAGNOSIS — F410 Panic disorder [episodic paroxysmal anxiety] without agoraphobia: Secondary | ICD-10-CM

## 2020-08-27 DIAGNOSIS — F1611 Hallucinogen abuse, in remission: Secondary | ICD-10-CM

## 2020-08-27 DIAGNOSIS — F3181 Bipolar II disorder: Secondary | ICD-10-CM

## 2020-08-27 DIAGNOSIS — F1221 Cannabis dependence, in remission: Secondary | ICD-10-CM

## 2020-08-27 DIAGNOSIS — G4701 Insomnia due to medical condition: Secondary | ICD-10-CM
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
## 2020-08-28 ENCOUNTER — Ambulatory Visit (INDEPENDENT_AMBULATORY_CARE_PROVIDER_SITE_OTHER): Payer: Medicare Other | Admitting: Adult Health

## 2020-08-28 ENCOUNTER — Encounter: Payer: Self-pay | Admitting: Adult Health

## 2020-08-28 VITALS — BP 132/76 | HR 99 | Temp 97.7°F | Ht 68.0 in | Wt 321.4 lb

## 2020-08-28 DIAGNOSIS — I701 Atherosclerosis of renal artery: Secondary | ICD-10-CM

## 2020-08-28 DIAGNOSIS — G4733 Obstructive sleep apnea (adult) (pediatric): Secondary | ICD-10-CM | POA: Diagnosis not present

## 2020-08-28 DIAGNOSIS — Z9989 Dependence on other enabling machines and devices: Secondary | ICD-10-CM | POA: Diagnosis not present

## 2020-08-28 DIAGNOSIS — G894 Chronic pain syndrome: Secondary | ICD-10-CM | POA: Diagnosis not present

## 2020-08-28 DIAGNOSIS — Z6841 Body Mass Index (BMI) 40.0 and over, adult: Secondary | ICD-10-CM

## 2020-08-28 NOTE — Assessment & Plan Note (Signed)
Patient has chronic pain syndrome on multiple sedating medications.  Patient education on use of caution with ongoing sedating medication use

## 2020-08-28 NOTE — Assessment & Plan Note (Addendum)
Severe obstructive sleep apnea with multiple medical comorbidities.  Home sleep study October 10, 2019 showed severe sleep apnea with AHI at 63 and SPO2 at 80%.  Patient had a previous stroke.  And has chronic pain on multiple narcotics and sedating medications.  He is having difficulties tolerating his CPAP.  He would benefit from a in lab CPAP titration study (option for BiPAP support if needed).  He also has severe morbid obesity with a BMI at 48.  Could have a component of hypoventilation.  Patient education on sleep apnea and CPAP therapy.  Patient does not like his full facemask.  We will order a DreamWear nasal mask.  He may try to use this while he is awaiting his CPAP titration study.  He is encouraged on healthy weight loss.  Activity as tolerated.  Caution on ongoing use of sedating medications.   Plan  Patient Instructions  Set up CPAP titration study.  Work on healthy weight loss.  Do not drive if sleepy .  Use caution with sedating medications.  May try dream wear nasal mask.  Try to wear CPAP each night all night .  Follow up in 3 months and As needed

## 2020-08-28 NOTE — Assessment & Plan Note (Signed)
Healthy weight loss encouraged 

## 2020-08-28 NOTE — Progress Notes (Signed)
@Patient  ID: Jake Brown, male    DOB: 02-Sep-1965, 55 y.o.   MRN: [id number]  Chief Complaint  Patient presents with  . sleep consult    Referring provider: McLean-Scocuzza, Olivia Mackie *  HPI: 55 year old male former smoker  presents August 28, 2020 to establish for sleep apnea Medical history significant for hypertension, bipolar disorder and migraines.  History of kidney cancer status post  Right nephrectomy, stage III kidney disease, Chronic pain-back/neck on chronic pain meds, previous stroke   TEST/EVENTS :  Home sleep study October 10, 2019 (care everywhere notes from [place]) showed severe sleep apnea with AHI at 63/hour with an SPO2 low at 80%  08/28/2020 Sleep Consult  Patient presents for a sleep consult to establish for sleep apnea.  Patient was previously seen at [facility] underwent a home sleep study October 10, 2019 for daytime sleepiness and snoring.  Patient was set up for a home sleep study that showed severe sleep apnea with AHI at 63/hour with an SPO2 low at 80%.  Records were obtained from Care Everywhere.  Patient says he was started on CPAP but has been unable to tolerate.  CPAP download shows poor compliance.  Patient is on auto CPAP 5 to 20 cm H2O.  Current mask is  Full face mask. Working with [employer] for alternative mask. Does not like full face mask due to irritation with eyes , causes some anxiety .   Patient has multiple medical problems including hypertension, chronic kidney disease with history of kidney cancer status post nephrectomy.  Epworth score is 12.  Patient complains of loud snoring witnessed apneic events, restless sleep, daytime sleepiness.  BMI is 48.  Current weight is 321  Goes to bed around 8 pm, takes 1hr to go to sleep . Gets up 3 times during nighttime to go to bathroom. Awakes 5-6 am .  1 cup of coffee or less. Weight trends up over last few years.   Had stroke 2019 with some mild right sided weakness . Now on Plavix.   Denies  sleep cataplexy, sleep walking , night terrors .   SH:  Disabled, lives alone. Lives locally, From [place]  Divorced. Former smoker, quit 1995 1 PPD x 46yr  No ETOH ,. NO drug use. Has 1 dog.  Patient denies drug use explicitly however chart review indicates polysubstance abuse history.  FH :  Parkinson in Dad   No Known Allergies  Immunization History  Administered Date(s) Administered  . Influenza,inj,Quad PF,6+ Mos 08/04/2018, 04/05/2019, 05/01/2020  . Influenza-Unspecified 08/04/2018  . PFIZER(Purple Top)SARS-COV-2 Vaccination 10/12/2019, 11/02/2019, 05/21/2020  . Tdap 08/20/2018    Past Medical History:  Diagnosis Date  . Anxiety   . Arthritis   . Cancer of kidney [facility])    right renal carcinoma (nephrectomy )  . Chronic kidney disease    stage 3   . Chronic low back pain with sciatica 05/04/2019  . Degenerative disc disease, cervical   . Degenerative disc disease, lumbar   . Dementia ([place])   . Depression   . Difficult intubation    no problems 01/17/12-stated "small esophagus"  . Heart murmur 1983 noted   no symptoms  . Hyperlipidemia   . Hypertension   . Nephrolithiasis   . PONV (postoperative nausea and vomiting)   . PTSD (post-traumatic stress disorder)   . Sleep apnea    sleeps in fowler position. No CPAP at present  . Stroke ([place])    09/2017     Tobacco  History: Social History   Tobacco Use  Smoking Status Former Smoker  . Packs/day: 1.00  . Years: 20.00  . Pack years: 20.00  . Quit date: 07/21/1993  . Years since quitting: 27.1  Smokeless Tobacco Never Used   Counseling given: Not Answered   Outpatient Medications Prior to Visit  Medication Sig Dispense Refill  . amLODipine (NORVASC) 5 MG tablet Qd. If BP >130/>80 take another 5 mg dose prn 180 tablet 3  . aspirin EC 81 MG tablet Take 1 tablet (81 mg total) by mouth daily. 90 tablet 3  . atorvastatin (LIPITOR) 40 MG tablet Take 1 tablet (40 mg total) by mouth daily at 6 PM. 90 tablet 3  .  baclofen (LIORESAL) 10 MG tablet Take by mouth.    Marland Kitchen buPROPion (WELLBUTRIN XL) 150 MG 24 hr tablet Take 1 tablet (150 mg total) by mouth daily. Dose reduction 90 tablet 0  . carvedilol (COREG) 25 MG tablet Take 1 tablet (25 mg total) by mouth 2 (two) times daily with a meal. 180 tablet 3  . Cholecalciferol (DIALYVITE VITAMIN D3 MAX) 1.25 MG (50000 UT) TABS Dialyvite Vitamin D3 Max 1,250 mcg (50,000 unit) tablet    . clonazePAM (KLONOPIN) 0.5 MG tablet Take 1 tablet (0.5 mg total) by mouth as directed. Start taking 1 tablet once a day as needed for severe panic attacks only- please limit use 6 tablet 1  . clopidogrel (PLAVIX) 75 MG tablet clopidogrel 75 mg tablet    . cyanocobalamin 1000 MCG tablet Take by mouth.    . EMGALITY 120 MG/ML SOAJ Inject into the skin.    . fentaNYL (DURAGESIC) 12 MCG/HR Place 12 mcg onto the skin every 3 (three) days.     . Galcanezumab-gnlm 120 MG/ML SOAJ Inject into the skin.    Marland Kitchen lamoTRIgine (LAMICTAL) 100 MG tablet Take 1.5 tablets (150 mg total) by mouth as directed. Start taking one tablet AM and Half tablet PM daily 135 tablet 0  . lisinopril (ZESTRIL) 10 MG tablet Take 1 tablet (10 mg total) by mouth daily. 90 tablet 3  . LORazepam (ATIVAN) 2 MG tablet Take 2 mg by mouth 2 (two) times daily as needed.    Marland Kitchen LORazepam (ATIVAN) 2 MG tablet take 1 pill 60 min before MRI and 2nd pill 15 min before MRI if needed. Patient should not drive when under influence of medicine. This can cause significant sedation for long time.    . mupirocin ointment (BACTROBAN) 2 % Apply 1 application topically 3 (three) times daily. toes 30 g 2  . Omega-3 Fatty Acids (RA FISH OIL) 1000 MG CAPS Take by mouth.    . ondansetron (ZOFRAN) 4 MG tablet Take by mouth.    . oxyCODONE-acetaminophen (PERCOCET) 10-325 MG tablet Take 1 tablet by mouth every 6 (six) hours as needed for pain.    . promethazine (PHENERGAN) 25 MG tablet Take 25 mg by mouth every 6 (six) hours as needed.    . sertraline  (ZOLOFT) 100 MG tablet Take 1 tablet (100 mg total) by mouth daily. 90 tablet 3  . terbinafine (LAMISIL) 250 MG tablet Take 1 tablet (250 mg total) by mouth daily. 90 tablet 0   No facility-administered medications prior to visit.     Review of Systems:   Constitutional:   No  weight loss, night sweats,  Fevers, chills, + fatigue, or  lassitude.  HEENT:   No headaches,  Difficulty swallowing,  Tooth/dental problems, or  Sore throat,  No sneezing, itching, ear ache, nasal congestion, post nasal drip,   CV:  No chest pain,  Orthopnea, PND, swelling in lower extremities, anasarca, dizziness, palpitations, syncope.   GI  No heartburn, indigestion, abdominal pain, nausea, vomiting, diarrhea, change in bowel habits, loss of appetite, bloody stools.   Resp: No shortness of breath with exertion or at rest.  No excess mucus, no productive cough,  No non-productive cough,  No coughing up of blood.  No change in color of mucus.  No wheezing.  No chest wall deformity  Skin: no rash or lesions.  GU: no dysuria, change in color of urine, no urgency or frequency.  No flank pain, no hematuria   MS: Chronic joint pain   Physical Exam  BP 132/76 (BP Location: Left Arm, Cuff Size: Normal)   Pulse 99   Temp 97.7 F (36.5 C) (Temporal)   Ht 5\' 8"  (1.727 m)   Wt (!) 321 lb 6.4 oz (145.8 kg)   SpO2 100%   BMI 48.87 kg/m   GEN: A/Ox3; pleasant , NAD, BMI 48   HEENT:  [place]/AT,   NOSE-clear, THROAT-clear, no lesions, no postnasal drip or exudate noted.  Class III MP airway  NECK:  Supple w/ fair ROM; no JVD; normal carotid impulses w/o bruits; no thyromegaly or nodules palpated; no lymphadenopathy.    RESP  Clear  P & A; w/o, wheezes/ rales/ or rhonchi. no accessory muscle use, no dullness to percussion  CARD:  RRR, no m/r/g, tr peripheral edema, pulses intact, no cyanosis or clubbing.  GI:   Soft & nt; nml bowel sounds; no organomegaly or masses detected.   Musco: Warm bil, no  deformities or joint swelling noted.   Neuro: alert, no focal deficits noted.    Skin: Warm, no lesions or rashes    Lab Results:  CBC  ProBNP No results found for: PROBNP  Imaging:  No results found for: NITRICOXIDE      Assessment & Plan:   OSA on CPAP Severe obstructive sleep apnea with multiple medical comorbidities.  Home sleep study October 10, 2019 showed severe sleep apnea with AHI at 63 and SPO2 at 80%.  Patient had a previous stroke.  And has chronic pain on multiple narcotics and sedating medications.  He is having difficulties tolerating his CPAP.  He would benefit from a in lab CPAP titration study (option for BiPAP support if needed).  He also has severe morbid obesity with a BMI at 48.  Could have a component of hypoventilation.  Patient education on sleep apnea and CPAP therapy.  Patient does not like his full facemask.  We will order a DreamWear nasal mask.  He may try to use this while he is awaiting his CPAP titration study.  He is encouraged on healthy weight loss.  Activity as tolerated.  Caution on ongoing use of sedating medications.   Plan  Patient Instructions  Set up CPAP titration study.  Work on healthy weight loss.  Do not drive if sleepy .  Use caution with sedating medications.  May try dream wear nasal mask.  Try to wear CPAP each night all night .  Follow up in 3 months and As needed       Morbid obesity with BMI of 45.0-49.9, adult ([place]) Healthy weight loss encouraged  Chronic pain syndrome Patient has chronic pain syndrome on multiple sedating medications.  Patient education on use of caution with ongoing sedating medication use     Rexene Edison, NP  08/28/2020  

## 2020-08-28 NOTE — Patient Instructions (Addendum)
Set up CPAP titration study.  Work on healthy weight loss.  Do not drive if sleepy .  Use caution with sedating medications.  May try dream wear nasal mask.  Try to wear CPAP each night all night .  Follow up in 3 months and As needed

## 2020-08-29 ENCOUNTER — Ambulatory Visit: Payer: Medicare Other

## 2020-09-04 ENCOUNTER — Other Ambulatory Visit: Payer: Self-pay | Admitting: Psychiatry

## 2020-09-04 ENCOUNTER — Ambulatory Visit: Payer: Medicare Other

## 2020-09-04 DIAGNOSIS — F410 Panic disorder [episodic paroxysmal anxiety] without agoraphobia: Secondary | ICD-10-CM

## 2020-09-05 NOTE — Progress Notes (Signed)
Reviewed and agree with assessment/plan.   Chesley Mires, MD [facility] Pulmonary/Critical Care 09/05/2020, 10:58 AM Pager:  [phone]

## 2020-09-05 NOTE — Progress Notes (Signed)
Reviewed and agree with assessment/plan.   Chesley Mires, MD [facility] Pulmonary/Critical Care 09/05/2020, 10:58 AM Pager:  [phone]

## 2020-09-06 ENCOUNTER — Ambulatory Visit: Payer: Medicare Other | Admitting: Physical Therapy

## 2020-09-07 ENCOUNTER — Telehealth: Payer: Self-pay

## 2020-09-07 DIAGNOSIS — F410 Panic disorder [episodic paroxysmal anxiety] without agoraphobia: Secondary | ICD-10-CM

## 2020-09-07 MED ORDER — LORAZEPAM 0.5 MG PO TABS: 0.5000 mg | ORAL_TABLET | ORAL | 0 refills | Status: DC

## 2020-09-07 NOTE — Telephone Encounter (Signed)
pt states he not doing well and that he wanted to speak with you he stated that he called the crises line last night and he wanted to speak with you about medications.

## 2020-09-07 NOTE — Telephone Encounter (Signed)
Returned call to patient.  Patient reports he had a panic attack that did not respond to any of his coping techniques yesterday.  He reports yesterday he came to know that there is something wrong with his health insurance and he is currently struggling with that.  Patient is agreeable to starting lorazepam low dosage.  We will provide 0.5 mg as needed 2 times a week for severe panic attacks only.  Advised patient to stop Klonopin.  Discussed with patient the drug to drug interaction between his lorazepam as well as opioid medications.  Also provided information for crisis center, [facility] in [place].  Patient has upcoming appointment next week.  Crisis plan discussed with patient.  He did not express any suicidality.

## 2020-09-11 ENCOUNTER — Telehealth (INDEPENDENT_AMBULATORY_CARE_PROVIDER_SITE_OTHER): Payer: 59 | Admitting: Psychiatry

## 2020-09-11 ENCOUNTER — Other Ambulatory Visit: Payer: Self-pay

## 2020-09-11 ENCOUNTER — Other Ambulatory Visit: Payer: Self-pay | Admitting: Neurology

## 2020-09-11 ENCOUNTER — Encounter: Payer: Self-pay | Admitting: Psychiatry

## 2020-09-11 DIAGNOSIS — F1611 Hallucinogen abuse, in remission: Secondary | ICD-10-CM

## 2020-09-11 DIAGNOSIS — G43719 Chronic migraine without aura, intractable, without status migrainosus: Secondary | ICD-10-CM

## 2020-09-11 DIAGNOSIS — F1221 Cannabis dependence, in remission: Secondary | ICD-10-CM

## 2020-09-11 DIAGNOSIS — F410 Panic disorder [episodic paroxysmal anxiety] without agoraphobia: Secondary | ICD-10-CM | POA: Diagnosis not present

## 2020-09-11 DIAGNOSIS — F1021 Alcohol dependence, in remission: Secondary | ICD-10-CM

## 2020-09-11 DIAGNOSIS — F3181 Bipolar II disorder: Secondary | ICD-10-CM

## 2020-09-11 DIAGNOSIS — G4701 Insomnia due to medical condition: Secondary | ICD-10-CM

## 2020-09-11 DIAGNOSIS — F1411 Cocaine abuse, in remission: Secondary | ICD-10-CM

## 2020-09-11 DIAGNOSIS — Z8249 Family history of ischemic heart disease and other diseases of the circulatory system: Secondary | ICD-10-CM

## 2020-09-11 MED ORDER — LAMOTRIGINE 200 MG PO TABS: 100.0000 mg | ORAL_TABLET | Freq: Two times a day (BID) | ORAL | 1 refills | Status: DC
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
## 2020-09-19 ENCOUNTER — Other Ambulatory Visit: Payer: Self-pay

## 2020-09-19 ENCOUNTER — Ambulatory Visit (INDEPENDENT_AMBULATORY_CARE_PROVIDER_SITE_OTHER): Payer: 59 | Admitting: Licensed Clinical Social Worker

## 2020-09-19 DIAGNOSIS — F3181 Bipolar II disorder: Secondary | ICD-10-CM

## 2020-09-19 NOTE — Progress Notes (Signed)
Virtual Visit via Video Note  I connected with Jake Brown on 09/19/20 at 11:00 AM EST by a video enabled telemedicine application and verified that I am speaking with the correct person using two identifiers.  Location: Patient: home Provider: [facility]   I discussed the limitations of evaluation and management by telemedicine and the availability of in person appointments. The patient expressed understanding and agreed to proceed.  I discussed the assessment and treatment plan with the patient. The patient was provided an opportunity to ask questions and all were answered. The patient agreed with the plan and demonstrated an understanding of the instructions.   The patient was advised to call back or seek an in-person evaluation if the symptoms worsen or if the condition fails to improve as anticipated.  I provided 60 minutes of non-face-to-face time during this encounter.   [place], LCSW    THERAPIST PROGRESS NOTE  Session Time: 11a-12p  Participation Level: Active  Behavioral Response: NeatAlertAnxious  Type of Therapy: Individual Therapy  Treatment Goals addressed: Anxiety and Coping  Interventions: CBT and DBT  Summary: Jake Brown is a 55 y.o. male who presents with continuing symptoms related to bipolar disorder diagnosis. Pt reports that mood fluctuates situationally but has been stable recently. Pt feels he is managing stress and anxiety well.   Allowed pt to explore thoughts and feelings about recent stressors--pt has decided not to move forward with peer support "I really don't have time right now".  Pt is coming off fentanyl patches and is concerned about how he is going to manage overall chronic pain. Reviewed coping strategies that have been helpful in the past and encouraged pt to continue.  Continued recommendations are as follows: self care behaviors, positive social engagements, focusing on overall work/home/life balance, and focusing  on positive physical and emotional wellness.   Suicidal/Homicidal: No  Therapist Response: Jake Brown is continuing to develop skills to verbally identify sources of his depression and process through it during session. Jake Brown is able to verbally express understanding of the relationship between depressed mood and repression of feelings. Jake Brown is continuing to work hard to implement strategies to help cope with chronic pain. These behaviors are reflective of personal growth and progress. Treatment to continue.  Plan: Return again in 3 weeks.  Diagnosis: Axis I: Bipolar, Depressed    Axis II: No diagnosis    Jake Bo Eldred Sooy, LCSW 09/19/2020

## 2020-09-25 ENCOUNTER — Ambulatory Visit: Payer: 59

## 2020-10-01 ENCOUNTER — Telehealth (INDEPENDENT_AMBULATORY_CARE_PROVIDER_SITE_OTHER): Payer: Medicare Other | Admitting: Psychiatry

## 2020-10-01 ENCOUNTER — Other Ambulatory Visit: Payer: Self-pay

## 2020-10-01 ENCOUNTER — Encounter: Payer: Self-pay | Admitting: Psychiatry

## 2020-10-01 DIAGNOSIS — F3181 Bipolar II disorder: Secondary | ICD-10-CM

## 2020-10-01 DIAGNOSIS — F1021 Alcohol dependence, in remission: Secondary | ICD-10-CM | POA: Diagnosis not present

## 2020-10-01 DIAGNOSIS — F410 Panic disorder [episodic paroxysmal anxiety] without agoraphobia: Secondary | ICD-10-CM

## 2020-10-01 DIAGNOSIS — F1221 Cannabis dependence, in remission: Secondary | ICD-10-CM

## 2020-10-01 DIAGNOSIS — G4701 Insomnia due to medical condition: Secondary | ICD-10-CM

## 2020-10-01 DIAGNOSIS — F1411 Cocaine abuse, in remission: Secondary | ICD-10-CM

## 2020-10-01 DIAGNOSIS — F1611 Hallucinogen abuse, in remission: Secondary | ICD-10-CM
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
## 2020-10-02 ENCOUNTER — Telehealth: Payer: Self-pay

## 2020-10-02 NOTE — Telephone Encounter (Signed)
pt called states that he called the dental assistance of [place] and also the pain doctor and he waiting on a return call from both.

## 2020-10-02 NOTE — Telephone Encounter (Signed)
Ok thank you 

## 2020-10-03 ENCOUNTER — Other Ambulatory Visit: Payer: Self-pay

## 2020-10-03 ENCOUNTER — Ambulatory Visit (INDEPENDENT_AMBULATORY_CARE_PROVIDER_SITE_OTHER): Payer: Medicare Other | Admitting: Internal Medicine

## 2020-10-03 ENCOUNTER — Encounter: Payer: Self-pay | Admitting: Internal Medicine

## 2020-10-03 ENCOUNTER — Telehealth: Payer: Self-pay | Admitting: Internal Medicine

## 2020-10-03 VITALS — BP 170/110 | HR 98 | Temp 98.0°F | Ht 68.0 in | Wt 323.6 lb

## 2020-10-03 DIAGNOSIS — C029 Malignant neoplasm of tongue, unspecified: Secondary | ICD-10-CM

## 2020-10-03 DIAGNOSIS — G4733 Obstructive sleep apnea (adult) (pediatric): Secondary | ICD-10-CM | POA: Diagnosis not present

## 2020-10-03 DIAGNOSIS — R591 Generalized enlarged lymph nodes: Secondary | ICD-10-CM

## 2020-10-03 DIAGNOSIS — Z5971 Insufficient health insurance coverage: Secondary | ICD-10-CM

## 2020-10-03 DIAGNOSIS — G894 Chronic pain syndrome: Secondary | ICD-10-CM

## 2020-10-03 DIAGNOSIS — G4719 Other hypersomnia: Secondary | ICD-10-CM

## 2020-10-03 DIAGNOSIS — R7303 Prediabetes: Secondary | ICD-10-CM | POA: Diagnosis not present

## 2020-10-03 DIAGNOSIS — Z5989 Other problems related to housing and economic circumstances: Secondary | ICD-10-CM

## 2020-10-03 DIAGNOSIS — Z659 Problem related to unspecified psychosocial circumstances: Secondary | ICD-10-CM

## 2020-10-03 DIAGNOSIS — I10 Essential (primary) hypertension: Secondary | ICD-10-CM

## 2020-10-03 DIAGNOSIS — G8929 Other chronic pain: Secondary | ICD-10-CM

## 2020-10-03 MED ORDER — OXYCODONE-ACETAMINOPHEN 10-325 MG PO TABS: 1.0000 | ORAL_TABLET | ORAL | Status: DC | PRN

## 2020-10-03 NOTE — Patient Instructions (Addendum)
Lisinopril 10 mg in the am  Coreg 25 mg in the am and pm  norvasc 5 mg at night   Nasal saline over the counter  claritin or allegra or zyrtec or xyzal as needed for allergies   [facility] [place]  [place] [phone]   [facility] [place]  [address]  [place] [address]  [phone]

## 2020-10-03 NOTE — Telephone Encounter (Signed)
lft vm for pt to call ofc to sch [facility]. Thanks
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
## 2020-10-04 LAB — COMPREHENSIVE METABOLIC PANEL
ALT: 16 U/L (ref 0–53)
AST: 15 U/L (ref 0–37)
Albumin: 4.2 g/dL (ref 3.5–5.2)
Alkaline Phosphatase: 95 U/L (ref 39–117)
BUN: 22 mg/dL (ref 6–23)
CO2: 28 mEq/L (ref 19–32)
Calcium: 9.5 mg/dL (ref 8.4–10.5)
Chloride: 100 mEq/L (ref 96–112)
Creatinine, Ser: 1.39 mg/dL (ref 0.40–1.50)
GFR: 56.48 mL/min — ABNORMAL LOW (ref >60.00)
Glucose, Bld: 86 mg/dL (ref 70–99)
Potassium: 4.6 mEq/L (ref 3.5–5.1)
Sodium: 138 mEq/L (ref 135–145)
Total Bilirubin: 0.4 mg/dL (ref 0.2–1.2)
Total Protein: 6.9 g/dL (ref 6.0–8.3)

## 2020-10-04 LAB — CBC WITH DIFFERENTIAL/PLATELET
Basophils Absolute: 0.1 10*3/uL (ref 0.0–0.1)
Basophils Relative: 0.8 % (ref 0.0–3.0)
Eosinophils Absolute: 0.4 10*3/uL (ref 0.0–0.7)
Eosinophils Relative: 3.6 % (ref 0.0–5.0)
HCT: 44 % (ref 39.0–52.0)
Hemoglobin: 14.5 g/dL (ref 13.0–17.0)
Lymphocytes Relative: 24 % (ref 12.0–46.0)
Lymphs Abs: 2.5 10*3/uL (ref 0.7–4.0)
MCHC: 33 g/dL (ref 30.0–36.0)
MCV: 91.7 fl (ref 78.0–100.0)
Monocytes Absolute: 0.6 10*3/uL (ref 0.1–1.0)
Monocytes Relative: 5.6 % (ref 3.0–12.0)
Neutro Abs: 6.9 10*3/uL (ref 1.4–7.7)
Neutrophils Relative %: 66 % (ref 43.0–77.0)
Platelets: 241 10*3/uL (ref 150.0–400.0)
RBC: 4.8 Mil/uL (ref 4.22–5.81)
RDW: 14.5 % (ref 11.5–15.5)
WBC: 10.5 10*3/uL (ref 4.0–10.5)

## 2020-10-04 LAB — HEMOGLOBIN A1C: Hgb A1c MFr Bld: 5.8 % (ref 4.6–6.5)

## 2020-10-04 NOTE — Telephone Encounter (Signed)
Margie this is the patient that Lexi with [facility] kept asking for his sleep study that you printed out of Care everywhere.  Lovena Le did submit to his insurance after I spoke with her.  I have left a message asking Lovena Le to check and call me back

## 2020-10-04 NOTE — Telephone Encounter (Signed)
cpap titration ordered on 08/28/2020. Patient stated that he has not been contacted by [facility]. He is concerned that he may be due to his insurance.   Rodena Piety, can you help with this?

## 2020-10-05 NOTE — Telephone Encounter (Signed)
Jake Brown from [facility] has called me back and Jake Brown is scheduled to do sleep study 10/08/20.  Jake Brown stated that she has spoke with him and he is aware of the appt and location

## 2020-10-08 ENCOUNTER — Ambulatory Visit: Payer: Medicare Other | Attending: Pulmonary Disease

## 2020-10-08 DIAGNOSIS — Z6841 Body Mass Index (BMI) 40.0 and over, adult: Secondary | ICD-10-CM | POA: Insufficient documentation

## 2020-10-08 DIAGNOSIS — Z8673 Personal history of transient ischemic attack (TIA), and cerebral infarction without residual deficits: Secondary | ICD-10-CM | POA: Insufficient documentation

## 2020-10-08 DIAGNOSIS — G4733 Obstructive sleep apnea (adult) (pediatric): Secondary | ICD-10-CM | POA: Insufficient documentation

## 2020-10-09 ENCOUNTER — Other Ambulatory Visit: Payer: Self-pay

## 2020-10-09 NOTE — Telephone Encounter (Signed)
Jake Brown, please advise.   Patient could not find [facility] last night to have his sleep study.

## 2020-10-09 NOTE — Telephone Encounter (Signed)
I spoke with Jake Brown and she stated that the patient did finally find the sleep lab and did do the sleep study.  He was nervous about using the Cpap but did do the sleep study last night

## 2020-10-10 ENCOUNTER — Other Ambulatory Visit: Payer: Self-pay

## 2020-10-10 ENCOUNTER — Telehealth: Payer: Self-pay | Admitting: Internal Medicine

## 2020-10-10 ENCOUNTER — Telehealth: Payer: Self-pay

## 2020-10-10 ENCOUNTER — Ambulatory Visit (INDEPENDENT_AMBULATORY_CARE_PROVIDER_SITE_OTHER): Payer: Medicare Other | Admitting: Licensed Clinical Social Worker

## 2020-10-10 DIAGNOSIS — F3181 Bipolar II disorder: Secondary | ICD-10-CM

## 2020-10-10 NOTE — Chronic Care Management (AMB) (Signed)
  Care Management   Outreach Note  10/10/2020 Name: Jake Brown MRN: [id number] DOB: 1965-10-16  Referred by: McLean-Scocuzza, Nino Glow, MD Reason for referral : Care Coordination (Outreach to schedule referral for LCSW )   An unsuccessful telephone outreach was attempted today. The patient was referred to the case management team for assistance with care management and care coordination.   Follow Up Plan: A HIPAA compliant phone message was left for the patient providing contact information and requesting a return call.  The care management team will reach out to the patient again over the next 5 days.  If patient returns call to provider office, please advise to call [facility]  at [place], [place], [place], [place] [address] Direct Dial: [phone] [email or web address]: [email or web address]

## 2020-10-10 NOTE — Progress Notes (Signed)
Virtual Visit via Video Note  I connected with Jake Brown on 10/10/20 at  2:00 PM EDT by a video enabled telemedicine application and verified that I am speaking with the correct person using two identifiers.  Location: Patient: home Provider: [facility]   I discussed the limitations of evaluation and management by telemedicine and the availability of in person appointments. The patient expressed understanding and agreed to proceed.   I discussed the assessment and treatment plan with the patient. The patient was provided an opportunity to ask questions and all were answered. The patient agreed with the plan and demonstrated an understanding of the instructions.   The patient was advised to call back or seek an in-person evaluation if the symptoms worsen or if the condition fails to improve as anticipated.  I provided 55 minutes of non-face-to-face time during this encounter.    R , LCSW    THERAPIST PROGRESS NOTE  Session Time: 2:05-3p  Participation Level: Active  Behavioral Response: Neat and Well GroomedAlertAnxious and Depressed  Type of Therapy: Individual Therapy  Treatment Goals addressed: Coping  Interventions: CBT, Solution Focused and Strength-based  Summary: Jake Brown is a 55 y.o. male who presents with improving symptoms related to bipolar disorder. Sleep quality and quantity has been impaired due to the discontinuation of fentanyl patches. Pt feels that sleep is starting to get better. Pt recently completed sleep study with results pending.   Allowed pt to explore and express thoughts and feelings about recent medication changes. Pts pain doctor wanted pt to discontinue fentanyl patches--which triggered some withdrawal symptoms when discontinued "It was a really tough two weeks".  Pt reports that he is still experiencing some pain in areas that pt doesn't recall hurting in the past.  Pt is also having concerns about his  focus/concentration after the stroke. Pt is open/honest with his PCP and is seeking additional support from medical social workers (insurance changes, etc.).    Continued recommendations are as follows: self care behaviors, positive social engagements, focusing on overall work/home/life balance, and focusing on positive physical and emotional wellness.   Suicidal/Homicidal: No  Therapist Response: Obi is continuing to develop skills for managing stress, solving daily problems, and resolving conflicts effectively; Alston is learning to implement calming skills to reduce overall tension. [place] is sharing his feelings and personal experience with depression in order to clarify and gain insight as to what is causing/triggering depression. These behaviors are reflective of continuing personal growth and progress.  Plan: Return again in 3-4 weeks.  Diagnosis: Axis I: Bipolar, mixed    Axis II: No diagnosis    Jake Brown , LCSW 10/10/2020

## 2020-10-10 NOTE — Telephone Encounter (Signed)
Patient called in about results

## 2020-10-11 NOTE — Telephone Encounter (Signed)
Patient informed and verbalized understanding

## 2020-10-11 NOTE — Telephone Encounter (Signed)
Thressa Sheller, CMA  10/08/2020 10:55 AM EDT Back to Top     Patient has seen results on mychart. Left message to return call with any further questions.    Nino Glow McLean-Scocuzza, MD  10/04/2020  4:40 PM EDT      A1C improved from 6.0 to 5.8 still with prediabetes if were 5.6 no prediabetes  Liver #s normal, kidneys stable and improved  Blood cts normal

## 2020-10-12 NOTE — Chronic Care Management (AMB) (Signed)
  Chronic Care Management   Note  10/12/2020 Name: Jake Brown MRN: [id number] DOB: Jan 24, 1966  Jake Brown is a 55 y.o. year old male who is a primary care patient of McLean-Scocuzza, Nino Glow, MD. I reached out to [employer] by phone today in response to a referral sent by [name]'s PCP, McLean-Scocuzza, Nino Glow, MD     [name] was given information about Chronic Care Management services today including:  1. CCM service includes personalized support from designated clinical staff supervised by his physician, including individualized plan of care and coordination with other care providers 2. 24/7 contact phone numbers for assistance for urgent and routine care needs. 3. Service will only be billed when office clinical staff spend 20 minutes or more in a month to coordinate care. 4. Only one practitioner may furnish and bill the service in a calendar month. 5. The patient may stop CCM services at any time (effective at the end of the month) by phone call to the office staff. 6. The patient will be responsible for cost sharing (co-pay) of up to 20% of the service fee (after annual deductible is met).  Patient agreed to services and verbal consent obtained.   Follow up plan: Telephone appointment with care management team member scheduled for:10/16/2020  Noreene Larsson, [place], [place], [place] [address] Direct Dial: [phone] [name] Website: [email or web address]

## 2020-10-15 DIAGNOSIS — G894 Chronic pain syndrome: Secondary | ICD-10-CM | POA: Diagnosis not present

## 2020-10-15 DIAGNOSIS — Z79891 Long term (current) use of opiate analgesic: Secondary | ICD-10-CM | POA: Diagnosis not present

## 2020-10-15 DIAGNOSIS — M4802 Spinal stenosis, cervical region: Secondary | ICD-10-CM | POA: Diagnosis not present

## 2020-10-15 DIAGNOSIS — M4726 Other spondylosis with radiculopathy, lumbar region: Secondary | ICD-10-CM | POA: Diagnosis not present

## 2020-10-16 ENCOUNTER — Telehealth: Payer: Self-pay | Admitting: *Deleted

## 2020-10-16 ENCOUNTER — Telehealth (INDEPENDENT_AMBULATORY_CARE_PROVIDER_SITE_OTHER): Payer: Medicare Other | Admitting: Pulmonary Disease

## 2020-10-16 ENCOUNTER — Telehealth: Payer: Medicare Other

## 2020-10-16 DIAGNOSIS — G4733 Obstructive sleep apnea (adult) (pediatric): Secondary | ICD-10-CM | POA: Diagnosis not present

## 2020-10-16 DIAGNOSIS — Z9989 Dependence on other enabling machines and devices: Secondary | ICD-10-CM

## 2020-10-16 NOTE — Telephone Encounter (Signed)
Order has been placed for bipap.  Recall has been placed for 63mo.

## 2020-10-16 NOTE — Telephone Encounter (Signed)
BiPAP 19/15 with med airfit F30 FF mask Alternatively, use autobipap

## 2020-10-16 NOTE — Telephone Encounter (Signed)
  Chronic Care Management   Note  10/16/2020 Name: RANEN DOOLIN MRN: [id number] DOB: May 15, 1966  LCSW received a referral for patient to assist with health insurance issues with [facility], in addition to issues related to denied coverage and claims through [facility] and private insurance, [employer].  In conversing with patient today, LCSW explained that insurance and claims are certainly not LCSW's area of expertise, but that LCSW would try to assist in any way possible.  After learning that patient's questions were definitely out of LCSW's scope of practice, LCSW encouraged patient to contact the Customer Service # on the back of his insurance cards to pose those questions directly with his insurance providers.  Patient agreed to do so after our call was terminated.  LCSW then spoke with patient about self-enrolling in vocational rehabilitation services, providing him with a brief summary of services provided, address, hours of operation and contact number.  Patient admitted that he has difficulty managing his mail and medical bills, requesting assistance, for which LCSW referred back to vocational rehabilitation services, and how he may greatly benefit from the education, training, and overall services that they have to offer.  Patient voiced understanding, agreeing to give them a call to further inquire.  No additional social work needs have been identified at this time; therefore, LCSW will be signing off on patient's plan of care.  Follow up plan: No further follow up required.  Nat Christen LCSW Licensed Clinical Social Worker [place]  [phone]

## 2020-10-16 NOTE — Telephone Encounter (Signed)
Called patient and discussed his sleep titration study.  Required BiPAP 19/15.  Please Set up for new BiPAP auto  IPAP max 19/ and EPAP min 10   PS 4  enroll in [employer], medium AirFit F 30 fullface mask.  Please make office visit in 3 months for follow-up.  [place] patient

## 2020-10-22 ENCOUNTER — Other Ambulatory Visit: Payer: Self-pay

## 2020-10-22 ENCOUNTER — Ambulatory Visit
Admission: RE | Admit: 2020-10-22 | Discharge: 2020-10-22 | Disposition: A | Discharge status: Home / Self Care | Payer: Medicare Other | Source: Ambulatory Visit | Attending: Internal Medicine | Admitting: Internal Medicine

## 2020-10-22 DIAGNOSIS — R590 Localized enlarged lymph nodes: Secondary | ICD-10-CM | POA: Diagnosis not present

## 2020-10-22 DIAGNOSIS — J338 Other polyp of sinus: Secondary | ICD-10-CM | POA: Diagnosis not present

## 2020-10-22 DIAGNOSIS — J392 Other diseases of pharynx: Secondary | ICD-10-CM | POA: Diagnosis not present

## 2020-10-22 DIAGNOSIS — R591 Generalized enlarged lymph nodes: Secondary | ICD-10-CM | POA: Insufficient documentation

## 2020-10-22 MED ORDER — IOHEXOL 300 MG/ML  SOLN
75.0000 mL | Freq: Once | INTRAMUSCULAR | Status: AC | PRN
  Administered 2020-10-22: 75 mL via INTRAVENOUS

## 2020-10-23 ENCOUNTER — Encounter: Payer: Self-pay | Admitting: Internal Medicine

## 2020-10-24 DIAGNOSIS — C029 Malignant neoplasm of tongue, unspecified: Secondary | ICD-10-CM | POA: Insufficient documentation

## 2020-10-24 DIAGNOSIS — J358 Other chronic diseases of tonsils and adenoids: Secondary | ICD-10-CM | POA: Insufficient documentation

## 2020-10-24 NOTE — Addendum Note (Signed)
Addended by: Orland Mustard on: 10/24/2020 07:31 AM   Modules accepted: Orders

## 2020-10-25 ENCOUNTER — Telehealth: Payer: Self-pay | Admitting: Internal Medicine

## 2020-10-25 DIAGNOSIS — Z87891 Personal history of nicotine dependence: Secondary | ICD-10-CM | POA: Diagnosis not present

## 2020-10-25 DIAGNOSIS — D3702 Neoplasm of uncertain behavior of tongue: Secondary | ICD-10-CM | POA: Diagnosis not present

## 2020-10-25 NOTE — Telephone Encounter (Signed)
Received a call from [facility] requesting the results of Patient's neck CT from 10/23/19.   Was given the fax number [phone] to fax the report to. This has been done.   Informed them that Patient had this done at [facility] in [place], [place]. [facility] will call there to receive the images as our office is unable to send these.

## 2020-10-30 ENCOUNTER — Ambulatory Visit (INDEPENDENT_AMBULATORY_CARE_PROVIDER_SITE_OTHER): Payer: Medicare Other | Admitting: Psychiatry

## 2020-10-30 ENCOUNTER — Encounter: Payer: Self-pay | Admitting: Psychiatry

## 2020-10-30 ENCOUNTER — Other Ambulatory Visit: Payer: Self-pay

## 2020-10-30 VITALS — BP 128/85 | HR 108 | Temp 97.8°F | Wt 320.6 lb

## 2020-10-30 DIAGNOSIS — G4701 Insomnia due to medical condition: Secondary | ICD-10-CM | POA: Diagnosis not present

## 2020-10-30 DIAGNOSIS — F1221 Cannabis dependence, in remission: Secondary | ICD-10-CM

## 2020-10-30 DIAGNOSIS — F3175 Bipolar disorder, in partial remission, most recent episode depressed: Secondary | ICD-10-CM

## 2020-10-30 DIAGNOSIS — F1611 Hallucinogen abuse, in remission: Secondary | ICD-10-CM

## 2020-10-30 DIAGNOSIS — F1411 Cocaine abuse, in remission: Secondary | ICD-10-CM

## 2020-10-30 DIAGNOSIS — F1021 Alcohol dependence, in remission: Secondary | ICD-10-CM

## 2020-10-30 DIAGNOSIS — F410 Panic disorder [episodic paroxysmal anxiety] without agoraphobia: Secondary | ICD-10-CM | POA: Diagnosis not present

## 2020-10-30 MED ORDER — BUPROPION HCL ER (XL) 150 MG PO TB24: 150.0000 mg | ORAL_TABLET | Freq: Every day | ORAL | 0 refills | Status: DC

## 2020-10-30 MED ORDER — TRAZODONE HCL 50 MG PO TABS: 25.0000 mg | ORAL_TABLET | Freq: Every evening | ORAL | 1 refills | Status: DC | PRN

## 2020-10-30 MED ORDER — LAMOTRIGINE 200 MG PO TABS: 100.0000 mg | ORAL_TABLET | Freq: Two times a day (BID) | ORAL | 1 refills | Status: DC

## 2020-10-30 NOTE — Patient Instructions (Signed)
Trazodone Tablets What is this medicine? TRAZODONE (TRAZ oh done) is used to treat depression. This medicine may be used for other purposes; ask your health care provider or pharmacist if you have questions. COMMON BRAND NAME(S): Desyrel What should I tell my health care provider before I take this medicine? They need to know if you have any of these conditions:  attempted suicide or thinking about it  bipolar disorder  bleeding problems  glaucoma  heart disease, or previous heart attack  irregular heart beat  kidney or liver disease  low levels of sodium in the blood  an unusual or allergic reaction to trazodone, other medicines, foods, dyes or preservatives  pregnant or trying to get pregnant  breast-feeding How should I use this medicine? Take this medicine by mouth with a glass of water. Follow the directions on the prescription label. Take this medicine shortly after a meal or a light snack. Take your medicine at regular intervals. Do not take your medicine more often than directed. Do not stop taking this medicine suddenly except upon the advice of your doctor. Stopping this medicine too quickly may cause serious side effects or your condition may worsen. A special MedGuide will be given to you by the pharmacist with each prescription and refill. Be sure to read this information carefully each time. Talk to your pediatrician regarding the use of this medicine in children. Special care may be needed. Overdosage: If you think you have taken too much of this medicine contact a poison control center or emergency room at once. NOTE: This medicine is only for you. Do not share this medicine with others. What if I miss a dose? If you miss a dose, take it as soon as you can. If it is almost time for your next dose, take only that dose. Do not take double or extra doses. What may interact with this medicine? Do not take this medicine with any of the following  medications:  certain medicines for fungal infections like fluconazole, itraconazole, ketoconazole, posaconazole, voriconazole  cisapride  dronedarone  linezolid  MAOIs like Carbex, Eldepryl, Marplan, Nardil, and Parnate  mesoridazine  methylene blue (injected into a vein)  pimozide  saquinavir  thioridazine This medicine may also interact with the following medications:  alcohol  antiviral medicines for HIV or AIDS  aspirin and aspirin-like medicines  barbiturates like phenobarbital  certain medicines for blood pressure, heart disease, irregular heart beat  certain medicines for depression, anxiety, or psychotic disturbances  certain medicines for migraine headache like almotriptan, eletriptan, frovatriptan, naratriptan, rizatriptan, sumatriptan, zolmitriptan  certain medicines for seizures like carbamazepine and phenytoin  certain medicines for sleep  certain medicines that treat or prevent blood clots like dalteparin, enoxaparin, warfarin  digoxin  fentanyl  lithium  NSAIDS, medicines for pain and inflammation, like ibuprofen or naproxen  other medicines that prolong the QT interval (cause an abnormal heart rhythm) like dofetilide  rasagiline  supplements like St. John's wort, kava kava, valerian  tramadol  tryptophan This list may not describe all possible interactions. Give your health care provider a list of all the medicines, herbs, non-prescription drugs, or dietary supplements you use. Also tell them if you smoke, drink alcohol, or use illegal drugs. Some items may interact with your medicine. What should I watch for while using this medicine? Tell your doctor if your symptoms do not get better or if they get worse. Visit your doctor or health care professional for regular checks on your progress. Because it may take   several weeks to see the full effects of this medicine, it is important to continue your treatment as prescribed by your  doctor. Patients and their families should watch out for new or worsening thoughts of suicide or depression. Also watch out for sudden changes in feelings such as feeling anxious, agitated, panicky, irritable, hostile, aggressive, impulsive, severely restless, overly excited and hyperactive, or not being able to sleep. If this happens, especially at the beginning of treatment or after a change in dose, call your health care professional. Dennis Bast may get drowsy or dizzy. Do not drive, use machinery, or do anything that needs mental alertness until you know how this medicine affects you. Do not stand or sit up quickly, especially if you are an older patient. This reduces the risk of dizzy or fainting spells. Alcohol may interfere with the effect of this medicine. Avoid alcoholic drinks. This medicine may cause dry eyes and blurred vision. If you wear contact lenses you may feel some discomfort. Lubricating drops may help. See your eye doctor if the problem does not go away or is severe. Your mouth may get dry. Chewing sugarless gum, sucking hard candy and drinking plenty of water may help. Contact your doctor if the problem does not go away or is severe. What side effects may I notice from receiving this medicine? Side effects that you should report to your doctor or health care professional as soon as possible:  allergic reactions like skin rash, itching or hives, swelling of the face, lips, or tongue  elevated mood, decreased need for sleep, racing thoughts, impulsive behavior  confusion  fast, irregular heartbeat  feeling faint or lightheaded, falls  feeling agitated, angry, or irritable  loss of balance or coordination  painful or prolonged erections  restlessness, pacing, inability to keep still  suicidal thoughts or other mood changes  tremors  trouble sleeping  seizures  unusual bleeding or bruising Side effects that usually do not require medical attention (report to your doctor  or health care professional if they continue or are bothersome):  change in sex drive or performance  change in appetite or weight  constipation  headache  muscle aches or pains  nausea This list may not describe all possible side effects. Call your doctor for medical advice about side effects. You may report side effects to FDA at 1-800-FDA-1088. Where should I keep my medicine? Keep out of the reach of children. Store at room temperature between 15 and 30 degrees C (59 to 86 degrees F). Protect from light. Keep container tightly closed. Throw away any unused medicine after the expiration date. NOTE: This sheet is a summary. It may not cover all possible information. If you have questions about this medicine, talk to your doctor, pharmacist, or health care provider.  2021 Elsevier/Gold Standard (2020-05-28 14:46:11) Serotonin Syndrome Serotonin is a chemical in your body (neurotransmitter) that helps to control several functions, such as:  Brain and nerve cell function.  Mood and emotions.  Memory.  Eating.  Sleeping.  Sexual activity.  Stress response. Having too much serotonin in your body can cause serotonin syndrome. This condition can be harmful to your brain and nerve cells. This can be a life-threatening condition. What are the causes? This condition may be caused by taking medicines or drugs that increase the level of serotonin in your body, such as:  Antidepressant medicines.  Migraine medicines.  Certain pain medicines.  Certain drugs, including ecstasy, LSD, cocaine, and amphetamines.  Over-the-counter cough or cold medicines that  contain dextromethorphan.  Certain herbal supplements, including St. John's wort, ginseng, and nutmeg. This condition usually occurs when you take these medicines or drugs in combination, but it can also happen with a high dose of a single medicine or drug. What increases the risk? You are more likely to develop this  condition if:  You just started taking a medicine or drug that increases the level of serotonin in the body.  You recently increased the dose of a medicine or drug that increases the level of serotonin in the body.  You take more than one medicine or drug that increases the level of serotonin in the body. What are the signs or symptoms? Symptoms of this condition usually start within several hours of taking a medicine or drug. Symptoms may be mild or severe. Mild symptoms include:  Sweating.  Restlessness or agitation.  Muscle twitching or stiffness.  Rapid heart rate.  Nausea and vomiting.  Diarrhea.  Headache.  Shivering or goose bumps.  Confusion. Severe symptoms include:  Irregular heartbeat.  Seizures.  Loss of consciousness.  High fever. How is this diagnosed? This condition may be diagnosed based on:  Your medical history.  A physical exam.  Your prior use of drugs and medicines.  Blood or urine tests. These may be used to rule out other causes of your symptoms. How is this treated? The treatment for this condition depends on the severity of your symptoms.  For mild cases, stopping the medicine or drug that caused your condition is usually all that is needed.  For moderate to severe cases, treatment in a hospital may be needed to prevent or manage life-threatening symptoms. This may include medicines to control your symptoms, IV fluids, interventions to support your breathing, and treatments to control your body temperature. Follow these instructions at home: Medicines  Take over-the-counter and prescription medicines only as told by your health care provider. This is important.  Check with your health care provider before you start taking any new prescriptions, over-the-counter medicines, herbs, or supplements.  Avoid combining any medicines that can cause this condition to occur.   Lifestyle  Maintain a healthy lifestyle. ? Eat a healthy diet  that includes plenty of vegetables, fruits, whole grains, low-fat dairy products, and lean protein. Do not eat a lot of foods that are high in fat, added sugars, or salt. ? Get the right amount and quality of sleep. Most adults need 7-9 hours of sleep each night. ? Make time to exercise, even if it is only for short periods of time. Most adults should exercise for at least 150 minutes each week. ? Do not drink alcohol. ? Do not use illegal drugs, and do not take medicines for reasons other than they are prescribed.   General instructions  Do not use any products that contain nicotine or tobacco, such as cigarettes and e-cigarettes. If you need help quitting, ask your health care provider.  Keep all follow-up visits as told by your health care provider. This is important. Contact a health care provider if:  Your symptoms do not improve or they get worse. Get help right away if you:  Have worsening confusion, severe headache, chest pain, high fever, seizures, or loss of consciousness.  Experience serious side effects of medicine, such as swelling of your face, lips, tongue, or throat.  Have serious thoughts about hurting yourself or others. These symptoms may represent a serious problem that is an emergency. Do not wait to see if the symptoms will go  away. Get medical help right away. Call your local emergency services (911 in the [place] Do not drive yourself to the hospital. If you ever feel like you may hurt yourself or others, or have thoughts about taking your own life, get help right away. You can go to your nearest emergency department or call:  Your local emergency services (911 in the [place]  A suicide crisis helpline, such as the [facility] at 778-816-2062. This is open 24 hours a day. Summary  Serotonin is a brain chemical that helps to regulate the nervous system. High levels of serotonin in the body can cause serotonin syndrome, which is a very  dangerous condition.  This condition may be caused by taking medicines or drugs that increase the level of serotonin in your body.  Treatment depends on the severity of your symptoms. For mild cases, stopping the medicine or drug that caused your condition is usually all that is needed.  Check with your health care provider before you start taking any new prescriptions, over-the-counter medicines, herbs, or supplements. This information is not intended to replace advice given to you by your health care provider. Make sure you discuss any questions you have with your health care provider. Document Revised: 08/14/2017 Document Reviewed: 08/14/2017 Elsevier Patient Education  2021 [employer].
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
## 2020-10-31 ENCOUNTER — Ambulatory Visit (INDEPENDENT_AMBULATORY_CARE_PROVIDER_SITE_OTHER): Payer: Medicare Other | Admitting: Licensed Clinical Social Worker

## 2020-10-31 ENCOUNTER — Other Ambulatory Visit: Payer: Self-pay

## 2020-10-31 DIAGNOSIS — F410 Panic disorder [episodic paroxysmal anxiety] without agoraphobia: Secondary | ICD-10-CM

## 2020-10-31 DIAGNOSIS — F3175 Bipolar disorder, in partial remission, most recent episode depressed: Secondary | ICD-10-CM | POA: Diagnosis not present

## 2020-10-31 NOTE — Progress Notes (Signed)
Virtual Visit via Audio Note  I connected with Jake Brown on 10/31/20 at 10:00 AM EDT by an audio enabled telemedicine application and verified that I am speaking with the correct person using two identifiers.  Location: Patient: home  Provider: [facility]   I discussed the limitations of evaluation and management by telemedicine and the availability of in person appointments. The patient expressed understanding and agreed to proceed.  I discussed the assessment and treatment plan with the patient. The patient was provided an opportunity to ask questions and all were answered. The patient agreed with the plan and demonstrated an understanding of the instructions.   The patient was advised to call back or seek an in-person evaluation if the symptoms worsen or if the condition fails to improve as anticipated.  I provided 45 minutes of non-face-to-face time during this encounter.  [place], LCSW   THERAPIST PROGRESS NOTE  Session Time: 10:15-11a. Pt 15 min late for appointment.   Participation Level: Active  Behavioral Response: NAAlertAnxious and Depressed  Type of Therapy: Individual Therapy  Treatment Goals addressed: Anxiety and Coping  Interventions: Supportive and Other: trauma focused  Summary: Jake Brown is a 55 y.o. male who presents with symptoms consistent with bipolar disorder--depression.Pt reports that overall mood has been stable and that he is managing situational stressors well. Pt reports that he is getting fair quality and quantity of sleep.   Allowed pt to explore and express thoughts and feelings surrounding recent medical concerns--pts doctors recently found a mass at base of pts tongue that needs a biopsy. Allowed pt to work through feelings associated with this new health concern.  This most recent health-related concern has triggered past incidences that pt reports were significant--especially when pt had kidney cancer and had to  undergo surgery to remove kidney. Discussed beliefs and emotional responses.   Allowed safe space for pt to discuss disability/stroke and overall psychological impact.  Continued recommendations are as follows: self care behaviors, positive social engagements, focusing on overall work/home/life balance, and focusing on positive physical and emotional wellness.    Suicidal/Homicidal: No  Therapist Response: Thai reports an escalation in symptoms due to a recent external stressor. Progress fluctuating/intermittent at time of session. Treatment to continue.   Plan: Return again in 4 weeks.  Diagnosis: Axis I: Bipolar, Depressed and Generalized Anxiety Disorder    Axis II: No diagnosis    Jake Bo Gloyd Happ, LCSW 10/31/2020

## 2020-11-01 DIAGNOSIS — G894 Chronic pain syndrome: Secondary | ICD-10-CM | POA: Diagnosis not present

## 2020-11-01 DIAGNOSIS — G319 Degenerative disease of nervous system, unspecified: Secondary | ICD-10-CM | POA: Diagnosis not present

## 2020-11-01 DIAGNOSIS — M5030 Other cervical disc degeneration, unspecified cervical region: Secondary | ICD-10-CM | POA: Diagnosis not present

## 2020-11-01 DIAGNOSIS — I639 Cerebral infarction, unspecified: Secondary | ICD-10-CM | POA: Diagnosis not present

## 2020-11-01 DIAGNOSIS — I10 Essential (primary) hypertension: Secondary | ICD-10-CM | POA: Diagnosis not present

## 2020-11-01 DIAGNOSIS — F32A Depression, unspecified: Secondary | ICD-10-CM | POA: Diagnosis not present

## 2020-11-01 DIAGNOSIS — C649 Malignant neoplasm of unspecified kidney, except renal pelvis: Secondary | ICD-10-CM | POA: Diagnosis not present

## 2020-11-01 DIAGNOSIS — R7303 Prediabetes: Secondary | ICD-10-CM | POA: Diagnosis not present

## 2020-11-01 DIAGNOSIS — G4730 Sleep apnea, unspecified: Secondary | ICD-10-CM | POA: Diagnosis not present

## 2020-11-01 DIAGNOSIS — I618 Other nontraumatic intracerebral hemorrhage: Secondary | ICD-10-CM | POA: Diagnosis not present

## 2020-11-01 DIAGNOSIS — F411 Generalized anxiety disorder: Secondary | ICD-10-CM | POA: Diagnosis not present

## 2020-11-01 DIAGNOSIS — C029 Malignant neoplasm of tongue, unspecified: Secondary | ICD-10-CM | POA: Diagnosis not present

## 2020-11-01 DIAGNOSIS — N183 Chronic kidney disease, stage 3 unspecified: Secondary | ICD-10-CM | POA: Diagnosis not present

## 2020-11-01 DIAGNOSIS — I69354 Hemiplegia and hemiparesis following cerebral infarction affecting left non-dominant side: Secondary | ICD-10-CM | POA: Diagnosis not present

## 2020-11-01 DIAGNOSIS — M5136 Other intervertebral disc degeneration, lumbar region: Secondary | ICD-10-CM | POA: Diagnosis not present

## 2020-11-01 DIAGNOSIS — Z01818 Encounter for other preprocedural examination: Secondary | ICD-10-CM | POA: Diagnosis not present

## 2020-11-02 DIAGNOSIS — R1310 Dysphagia, unspecified: Secondary | ICD-10-CM | POA: Diagnosis not present

## 2020-11-02 DIAGNOSIS — Z905 Acquired absence of kidney: Secondary | ICD-10-CM | POA: Diagnosis not present

## 2020-11-02 DIAGNOSIS — R7303 Prediabetes: Secondary | ICD-10-CM | POA: Diagnosis not present

## 2020-11-02 DIAGNOSIS — G43909 Migraine, unspecified, not intractable, without status migrainosus: Secondary | ICD-10-CM | POA: Diagnosis not present

## 2020-11-02 DIAGNOSIS — M150 Primary generalized (osteo)arthritis: Secondary | ICD-10-CM | POA: Diagnosis not present

## 2020-11-02 DIAGNOSIS — K219 Gastro-esophageal reflux disease without esophagitis: Secondary | ICD-10-CM | POA: Diagnosis not present

## 2020-11-02 DIAGNOSIS — K148 Other diseases of tongue: Secondary | ICD-10-CM | POA: Diagnosis not present

## 2020-11-02 DIAGNOSIS — M5030 Other cervical disc degeneration, unspecified cervical region: Secondary | ICD-10-CM | POA: Diagnosis not present

## 2020-11-02 DIAGNOSIS — G4733 Obstructive sleep apnea (adult) (pediatric): Secondary | ICD-10-CM | POA: Diagnosis not present

## 2020-11-02 DIAGNOSIS — R059 Cough, unspecified: Secondary | ICD-10-CM | POA: Diagnosis not present

## 2020-11-02 DIAGNOSIS — I10 Essential (primary) hypertension: Secondary | ICD-10-CM | POA: Diagnosis not present

## 2020-11-02 DIAGNOSIS — D490 Neoplasm of unspecified behavior of digestive system: Secondary | ICD-10-CM | POA: Diagnosis not present

## 2020-11-02 DIAGNOSIS — D104 Benign neoplasm of tonsil: Secondary | ICD-10-CM | POA: Diagnosis not present

## 2020-11-02 DIAGNOSIS — G894 Chronic pain syndrome: Secondary | ICD-10-CM | POA: Diagnosis not present

## 2020-11-02 DIAGNOSIS — I69391 Dysphagia following cerebral infarction: Secondary | ICD-10-CM | POA: Diagnosis not present

## 2020-11-02 DIAGNOSIS — H9202 Otalgia, left ear: Secondary | ICD-10-CM | POA: Diagnosis not present

## 2020-11-02 DIAGNOSIS — E669 Obesity, unspecified: Secondary | ICD-10-CM | POA: Diagnosis not present

## 2020-11-03 ENCOUNTER — Other Ambulatory Visit: Payer: Self-pay | Admitting: Psychiatry

## 2020-11-03 DIAGNOSIS — F3175 Bipolar disorder, in partial remission, most recent episode depressed: Secondary | ICD-10-CM

## 2020-11-07 ENCOUNTER — Other Ambulatory Visit: Payer: Self-pay | Admitting: Psychiatry

## 2020-11-07 DIAGNOSIS — F3181 Bipolar II disorder: Secondary | ICD-10-CM

## 2020-11-09 ENCOUNTER — Telehealth: Payer: Self-pay

## 2020-11-09 NOTE — Telephone Encounter (Signed)
Patient had a biopsy done last week, 11/02/20, at [facility] on masses that were found in his mouth/throat via CT. States he has not gotten these results back yet.   Spoke with Dr Olivia Mackie McLean-Scocuzza and informed the Patient that she has not received the results as of yet. Patient verbalized understanding and will try to call the [place] office before they close for the weekend.

## 2020-11-09 NOTE — Telephone Encounter (Signed)
Pt called and would like a call back about the biopsy on his throat that he had done at [facility].

## 2020-11-14 DIAGNOSIS — M4802 Spinal stenosis, cervical region: Secondary | ICD-10-CM | POA: Diagnosis not present

## 2020-11-14 DIAGNOSIS — Z79891 Long term (current) use of opiate analgesic: Secondary | ICD-10-CM | POA: Diagnosis not present

## 2020-11-14 DIAGNOSIS — G894 Chronic pain syndrome: Secondary | ICD-10-CM | POA: Diagnosis not present

## 2020-11-14 DIAGNOSIS — M4726 Other spondylosis with radiculopathy, lumbar region: Secondary | ICD-10-CM | POA: Diagnosis not present

## 2020-11-15 ENCOUNTER — Other Ambulatory Visit: Payer: Self-pay | Admitting: Anesthesiology

## 2020-11-15 DIAGNOSIS — M5416 Radiculopathy, lumbar region: Secondary | ICD-10-CM

## 2020-11-15 DIAGNOSIS — M545 Low back pain, unspecified: Secondary | ICD-10-CM

## 2020-11-16 ENCOUNTER — Telehealth: Payer: Self-pay | Admitting: Internal Medicine

## 2020-11-16 ENCOUNTER — Ambulatory Visit: Payer: Medicare Other | Admitting: Internal Medicine

## 2020-11-16 NOTE — Telephone Encounter (Signed)
[place] RECORD AccessNurse Patient Name: Jake Brown Gender: Male DOB: 1966-04-29 Age: 55 Y 1 M 7 D Return Phone Number: [phone] (Primary) Address: City/ State/ Zip: [place]  [address] Client [place] [facility] [place] Client Site [place] - Day Physician McClean-Scocuzza, Olivia Mackie Contact Type Call Who Is Calling Patient / Member / Family / Caregiver Call Type Triage / Clinical Relationship To Patient Self Return Phone Number [phone] (Primary) Chief Complaint CONFUSION - new onset Reason for Call Symptomatic / Request for [name] states he did not make his appt today because he urinated on himself. He seems to have lost control of his bladder. He has been sleeping a lot. He also seems very confused. Translation No Nurse Assessment Nurse: Kathi Ludwig, RN, Leana Roe Date/Time Eilene Ghazi Time): 11/16/2020 2:17:59 PM Confirm and document reason for call. If symptomatic, describe symptoms. ---Caller states he did not make his appt today. incontinence, fatigue, confusion. no fever, severe back pain right side Does the patient have any new or worsening symptoms? ---Yes Will a triage be completed? ---Yes Related visit to physician within the last 2 weeks? ---No Does the PT have any chronic conditions? (i.e. diabetes, asthma, this includes High risk factors for pregnancy, etc.) ---Yes List chronic conditions. ---HTN, CVA Is this a behavioral health or substance abuse call? ---No Guidelines Guideline Title Affirmed Question Affirmed Notes Nurse Date/Time (Eastern Time) Urinary Symptoms Side (flank) or lower back pain present Kathi Ludwig, Therapist, sports, Tracie 11/16/2020 2:21:18 PM Disp. Time Eilene Ghazi Time) Disposition Final User 11/16/2020 2:15:51 PM Send to Urgent Queue Susette Racer PLEASE NOTE: All timestamps contained within this report are  represented as [place] Standard Time. CONFIDENTIALTY NOTICE: This fax transmission is intended only for the addressee. It contains information that is legally privileged, confidential or otherwise protected from use or disclosure. If you are not the intended recipient, you are strictly prohibited from reviewing, disclosing, copying using or disseminating any of this information or taking any action in reliance on or regarding this information. If you have received this fax in error, please notify us immediately by telephone so that we can arrange for its return to [place]. Phone: [phone], Toll-Free: [phone], Fax: [phone] Page: 2 of 2 Call Id: [id number] 11/16/2020 2:23:23 PM See PCP within 24 Hours Yes Kathi Ludwig, RN, Minerva Ends Disagree/Comply Comply Caller Understands Yes PreDisposition Did not know what to do Care Advice Given Per Guideline SEE PCP WITHIN 24 HOURS: * IF OFFICE WILL BE OPEN: You need to be examined within the next 24 hours. Call your doctor (or NP/PA) when the office opens and make an appointment. CARE ADVICE given per Urinary Symptoms (Adult) guideline. CALL BACK IF: * Fever occurs * Unable to urinate and bladder feels full * You become worse Referrals REFERRED TO PCP OFFICE

## 2020-11-16 NOTE — Telephone Encounter (Signed)
I would rec [facility] Urgent care and sch f/u with me next week  Call and inform and schedule f/u with me   Urinating on himself also may need to see urology in the future

## 2020-11-16 NOTE — Telephone Encounter (Signed)
Patient was informed. He stated he believes he couldn't get up last night, he is having an issue with movement. He stated the reason he missed his appointment due to over sleeping.he will go be evaluated and keep his appointment.

## 2020-11-16 NOTE — Telephone Encounter (Signed)
Patient no-showed today's appointment; appointment was for 11/16/20 at 10:30 am, provider notified for review of record. Letter sent for patient to call in and re-schedule.

## 2020-11-16 NOTE — Telephone Encounter (Signed)
Patient called in about his appointment and stated why he missed his appointment because he urinated on his self and he fell asleep did not wake up until 30 minutes before he called in about his appointment and his issue which was about 2:15pm he was given to access nurse to triage him

## 2020-11-19 ENCOUNTER — Other Ambulatory Visit: Payer: Self-pay | Admitting: Internal Medicine

## 2020-11-19 DIAGNOSIS — E785 Hyperlipidemia, unspecified: Secondary | ICD-10-CM

## 2020-11-19 DIAGNOSIS — I10 Essential (primary) hypertension: Secondary | ICD-10-CM

## 2020-11-19 DIAGNOSIS — Z8673 Personal history of transient ischemic attack (TIA), and cerebral infarction without residual deficits: Secondary | ICD-10-CM

## 2020-11-20 ENCOUNTER — Emergency Department: Payer: Medicare Other

## 2020-11-20 ENCOUNTER — Emergency Department
Admission: EM | Admit: 2020-11-20 | Discharge: 2020-11-20 | Disposition: A | Discharge status: Home / Self Care | Payer: Medicare Other | Attending: Emergency Medicine | Admitting: Emergency Medicine

## 2020-11-20 ENCOUNTER — Other Ambulatory Visit: Payer: Self-pay

## 2020-11-20 DIAGNOSIS — Z87891 Personal history of nicotine dependence: Secondary | ICD-10-CM | POA: Insufficient documentation

## 2020-11-20 DIAGNOSIS — Z79899 Other long term (current) drug therapy: Secondary | ICD-10-CM | POA: Insufficient documentation

## 2020-11-20 DIAGNOSIS — Z7982 Long term (current) use of aspirin: Secondary | ICD-10-CM | POA: Insufficient documentation

## 2020-11-20 DIAGNOSIS — M5441 Lumbago with sciatica, right side: Secondary | ICD-10-CM | POA: Insufficient documentation

## 2020-11-20 DIAGNOSIS — Z85528 Personal history of other malignant neoplasm of kidney: Secondary | ICD-10-CM | POA: Insufficient documentation

## 2020-11-20 DIAGNOSIS — M545 Low back pain, unspecified: Secondary | ICD-10-CM | POA: Diagnosis not present

## 2020-11-20 DIAGNOSIS — G8929 Other chronic pain: Secondary | ICD-10-CM | POA: Insufficient documentation

## 2020-11-20 DIAGNOSIS — I129 Hypertensive chronic kidney disease with stage 1 through stage 4 chronic kidney disease, or unspecified chronic kidney disease: Secondary | ICD-10-CM | POA: Insufficient documentation

## 2020-11-20 DIAGNOSIS — N183 Chronic kidney disease, stage 3 unspecified: Secondary | ICD-10-CM | POA: Diagnosis not present

## 2020-11-20 DIAGNOSIS — F0390 Unspecified dementia without behavioral disturbance: Secondary | ICD-10-CM | POA: Insufficient documentation

## 2020-11-20 DIAGNOSIS — Z85810 Personal history of malignant neoplasm of tongue: Secondary | ICD-10-CM | POA: Diagnosis not present

## 2020-11-20 LAB — URINALYSIS, COMPLETE (UACMP) WITH MICROSCOPIC
Bacteria, UA: NONE SEEN
Bilirubin Urine: NEGATIVE
Glucose, UA: NEGATIVE mg/dL
Hgb urine dipstick: NEGATIVE
Ketones, ur: NEGATIVE mg/dL
Leukocytes,Ua: NEGATIVE
Nitrite: NEGATIVE
Protein, ur: NEGATIVE mg/dL
Specific Gravity, Urine: 1.023 (ref 1.005–1.030)
Squamous Epithelial / HPF: NONE SEEN (ref 0–5)
WBC, UA: NONE SEEN WBC/hpf (ref 0–5)
pH: 5 (ref 5.0–8.0)

## 2020-11-20 MED ORDER — PREDNISONE 10 MG PO TABS: ORAL_TABLET | ORAL | 0 refills | Status: DC

## 2020-11-20 MED ORDER — LORAZEPAM 1 MG PO TABS
2.0000 mg | ORAL_TABLET | Freq: Once | ORAL | Status: AC
  Administered 2020-11-20: 2 mg via ORAL
  Filled 2020-11-20: qty 2

## 2020-11-20 NOTE — ED Notes (Signed)
See triage note   Presents with upper back pain Thinks he may have something pressing on his neck and upper back   States he has a hx of lower back pain which moves into right leg

## 2020-11-20 NOTE — Discharge Instructions (Addendum)
Follow-up with your primary care provider and your regular pain doctor for any continued problems with your low back.  A prescription for prednisone was sent to your pharmacy.  This should help with the sciatic type pain that you are having.  Continue taking the pain medication that your doctor has already prescribed for you.  You may also use ice or heat to your back as needed for discomfort.

## 2020-11-20 NOTE — ED Notes (Signed)
Waiting for MRI  PO meds given   Resting at present

## 2020-11-20 NOTE — ED Triage Notes (Signed)
Pt come with c/o lower back pain. Pt states this has been ongoing. Pt states upper back pain now. Pt states unsure the case.
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
## 2020-11-21 ENCOUNTER — Encounter: Payer: Self-pay | Admitting: Internal Medicine

## 2020-11-21 ENCOUNTER — Ambulatory Visit (INDEPENDENT_AMBULATORY_CARE_PROVIDER_SITE_OTHER): Payer: Medicare Other | Admitting: Internal Medicine

## 2020-11-21 VITALS — BP 126/80 | HR 92 | Temp 98.0°F | Ht 68.0 in | Wt 316.2 lb

## 2020-11-21 DIAGNOSIS — R1310 Dysphagia, unspecified: Secondary | ICD-10-CM

## 2020-11-21 DIAGNOSIS — F419 Anxiety disorder, unspecified: Secondary | ICD-10-CM | POA: Diagnosis not present

## 2020-11-21 DIAGNOSIS — F32A Depression, unspecified: Secondary | ICD-10-CM

## 2020-11-21 DIAGNOSIS — G8929 Other chronic pain: Secondary | ICD-10-CM

## 2020-11-21 DIAGNOSIS — J358 Other chronic diseases of tonsils and adenoids: Secondary | ICD-10-CM

## 2020-11-21 DIAGNOSIS — M5441 Lumbago with sciatica, right side: Secondary | ICD-10-CM

## 2020-11-21 DIAGNOSIS — F339 Major depressive disorder, recurrent, unspecified: Secondary | ICD-10-CM

## 2020-11-21 DIAGNOSIS — G4701 Insomnia due to medical condition: Secondary | ICD-10-CM | POA: Diagnosis not present

## 2020-11-21 DIAGNOSIS — R937 Abnormal findings on diagnostic imaging of other parts of musculoskeletal system: Secondary | ICD-10-CM | POA: Insufficient documentation

## 2020-11-21 NOTE — Patient Instructions (Addendum)
A. Left base of tongue, biopsy:  Benign tonsillar-type tissue. Negative for high grade dysplasia or carcinoma.    Reviewed by resident/fellow Wyatt Mage  Electronically signed by Caryl Never, MD on 11/07/2020 at 9:49 AM  Clinical Information    Fullness of base of tongue  Gross Examination    A.  "Left base of tongue", received fresh and placed in formalin at 1730 on 11/02/2020 is a 0.8 x 0.3 x 0.3 cm aggregate of tan tissue submitted entirely in block A1.  [place]/Dr. Blase Mess   Microscopic Examination    Microscopic examination is performed.  Additional Documentation    All immunohistochemistry, in situ hybridization tests and special stains performed at [facility] and reported herein were developed, validated and their performance characteristics determined by the [facility]. During the performance of these tests, appropriate positive and negative control slides are also performed and reviewed. All control slides and internal controls (when applicable) demonstrate the expected immunoreactive patterns and/or nucleic acid hybridization. These ancillary studies were deemed medically necessary by the requesting pathologist. They were ordered following review of the H&E and clinical history except when part of a liver/kidney protocol or where clinical history (e.g. immunocompromised, critically ill, history of malignancy) clearly indicates. Some of the tests may not be cleared or approved by the U.S. Food and Drug Administration (FDA).  The FDA has determined that such clearance or approval is not necessary.  These tests are used for clinical purposes and should not be regarded as investigational or as research.  This laboratory is certified under the [place] (CLIA) as qualified to perform high complexity clinical testing.  Attestation    All of the diagnostic evaluations on the enumerated specimens have been  personally conducted by the pathologists involved in the care of this patient as indicated by the electronic signatures above.  Resulting Agency [facility]  Specimen Collected: 11/02/20 4:44 PM Last Resulted: 11/07/20 9:49 AM  Received From: [facility]  Result Received: 11/14/20 11:59 AM   [facility] [address]  [place] [address] [phone]   [facility]  [address]  [place] [address]  [phone]     Nasal saline and over the counter allergy pill claritin, allegra, xyzal, zyrtec   Postnasal Drip Postnasal drip is the feeling of mucus going down the back of your throat. Mucus is a slimy substance that moistens and cleans your nose and throat, as well as the air pockets in face bones near your forehead and cheeks (sinuses). Small amounts of mucus pass from your nose and sinuses down the back of your throat all the time. This is normal. When you produce too much mucus or the mucus gets too thick, you can feel it. Some common causes of postnasal drip include:  Having more mucus because of: ? A cold or the flu. ? Allergies. ? Cold air. ? Certain medicines.  Having more mucus that is thicker because of: ? A sinus or nasal infection. ? Dry air. ? A food allergy. Follow these instructions at home: Relieving discomfort  Gargle with a salt-water mixture 3-4 times a day or as needed. To make a salt-water mixture, completely dissolve -1 tsp of salt in 1 cup of warm water.  If the air in your home is dry, use a humidifier to add moisture to the air.  Use a saline spray  or container (neti pot) to flush out the nose (nasal irrigation). These methods can help clear away mucus and keep the nasal passages moist.   General instructions  Take over-the-counter and prescription medicines only as told by your health care  provider.  Follow instructions from your health care provider about eating or drinking restrictions. You may need to avoid caffeine.  Avoid things that you know you are allergic to (allergens), like dust, mold, pollen, pets, or certain foods.  Drink enough fluid to keep your urine pale yellow.  Keep all follow-up visits as told by your health care provider. This is important. Contact a health care provider if:  You have a fever.  You have a sore throat.  You have difficulty swallowing.  You have headache.  You have sinus pain.  You have a cough that does not go away.  The mucus from your nose becomes thick and is green or yellow in color.  You have cold or flu symptoms that last more than 10 days. Summary  Postnasal drip is the feeling of mucus going down the back of your throat.  If your health care provider approves, use nasal irrigation or a nasal spray 2?4 times a day.  Avoid things that you know you are allergic to (allergens), like dust, mold, pollen, pets, or certain foods. This information is not intended to replace advice given to you by your health care provider. Make sure you discuss any questions you have with your health care provider. Document Revised: 04/17/2020 Document Reviewed: 04/17/2020 Elsevier Patient Education  2021 [place].  Trigger Point Dry Needling   What is Trigger Point Dry Needling (DN)?   1. DN is a physical therapy technique used to treat muscle pain and     Dysfunction.  Specifically, DN helps deactivate muscle trigger    points (Muscle Knots).   2. A thin filiform needle is used to penetrate the skin and stimulate    the underlying trigger point.  The goal is for a local twitch     response (LTR) to occur and for the trigger point to relax.  No    medication of any kind is injected during the procedure.   What Does Trigger Point Dry Needling Feel Like?   1. The procedures feels different for each individual patient.   Some     patients report that they do not actually feel the      needle enter the skin and overall the process is not  painful.  Very    mild bleeding may occur.  However, many patients feel a deep    cramping in the muscle in which the needle was inserted. This is t   he local twitch response.    How Will I Feel After The Treatment?   1. Soreness is normal, and the onset of soreness may not occur for    a few hours.  Typically this soreness does not last longer than two    days.   2. Bruising is uncommon, however; ice can be used to decrease any    possible bruising.   3. In rare cases feeling tired or nauseous after the treatment is    normal.  In addition, your symptoms may get worse before they    get better, this period will typically not last longer than 24 hours.   What Can I do After My Treatment?   1.  Increase your hydration by drinking more water for the next 24  hours.   2.  You may place ice or heat on the areas treated that have become    sore, however don not use heat on inflamed or bruised areas.     Heat often brings more relief post needling.   3. You can continue your regular activities, but vigorous activity is    not recommended initially after the treatment for 24 hours.   4. DN is best combined with other physical therapy such as     strengthening, stretching, and other therapies.
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
## 2020-11-22 ENCOUNTER — Telehealth: Payer: Self-pay

## 2020-11-22 NOTE — Telephone Encounter (Signed)
Faxed MR Lumbar Spine WO contract and most recent office notes to Dr. Nicholaus Bloom at the pain clinic @ [phone] on 11/21/20 Provider wrote a note on fax that pt needs a f/u with him

## 2020-11-27 ENCOUNTER — Ambulatory Visit: Payer: Medicare Other | Admitting: Licensed Clinical Social Worker

## 2020-11-30 ENCOUNTER — Ambulatory Visit: Payer: 59 | Admitting: Internal Medicine

## 2020-12-04 ENCOUNTER — Telehealth: Payer: Medicare Other | Admitting: Psychiatry

## 2020-12-06 ENCOUNTER — Other Ambulatory Visit: Payer: Self-pay

## 2020-12-06 ENCOUNTER — Telehealth (INDEPENDENT_AMBULATORY_CARE_PROVIDER_SITE_OTHER): Payer: Medicare Other | Admitting: Psychiatry

## 2020-12-06 DIAGNOSIS — Z91199 Patient's noncompliance with other medical treatment and regimen due to unspecified reason: Secondary | ICD-10-CM | POA: Insufficient documentation

## 2020-12-06 DIAGNOSIS — Z5329 Procedure and treatment not carried out because of patient's decision for other reasons: Secondary | ICD-10-CM | POA: Insufficient documentation

## 2020-12-06 NOTE — Progress Notes (Signed)
No response to call or text or video invite  

## 2020-12-11 ENCOUNTER — Ambulatory Visit (INDEPENDENT_AMBULATORY_CARE_PROVIDER_SITE_OTHER): Payer: Medicare Other | Admitting: Licensed Clinical Social Worker

## 2020-12-11 ENCOUNTER — Other Ambulatory Visit: Payer: Self-pay

## 2020-12-11 DIAGNOSIS — F3175 Bipolar disorder, in partial remission, most recent episode depressed: Secondary | ICD-10-CM | POA: Diagnosis not present

## 2020-12-11 NOTE — Progress Notes (Signed)
Virtual Visit via Video Note  I connected with Jake Brown on 12/11/20 at  8:00 AM EDT by a video enabled telemedicine application and verified that I am speaking with the correct person using two identifiers.  Location: Patient: home Provider: remote office [place], [place])   I discussed the limitations of evaluation and management by telemedicine and the availability of in person appointments. The patient expressed understanding and agreed to proceed.  I discussed the assessment and treatment plan with the patient. The patient was provided an opportunity to ask questions and all were answered. The patient agreed with the plan and demonstrated an understanding of the instructions.   The patient was advised to call back or seek an in-person evaluation if the symptoms worsen or if the condition fails to improve as anticipated.  I provided 60 minutes of non-face-to-face time during this encounter.   Jake Brown Jake Abdullah Rizzi, LCSW   THERAPIST PROGRESS NOTE  Session Time: 8-9  Participation Level: Active  Behavioral Response: Neat and Well GroomedAlertDepressed  Type of Therapy: Individual Therapy  Treatment Goals addressed: Coping  Interventions: Supportive and Other: chronic pain management; trauma focused  Summary: Jake Brown is a 55 y.o. male who presents with symptoms associated with bipolar disorder. Pt states that he feels that overall mood is stable at the present time and that he is managing external stressors better than in previous sessions. Pt does feel that his insomnia has escalated since previous session.   Allowed pt to explore and express thoughts and feelings about recent life events and external stressors. Jake Brown is currently concerned about psychiatric medication management--pt is feeling that meds are being changed too quickly.  Pt states that he consulted with PCP about his thoughts/concerns.  Allowed pt to explore health-related concerns and recent  testing results.  Discussed continuing levels of pain. Reviewed pain management strategies.   Discussed coping skills that pt is using for management of depression symptoms. Reviewed what coping skills are working and discussed newer ones.  Discussed recreational engagement/activities to help with overall stimulation/social engagement.  Continued recommendations are as follows: self care behaviors, positive social engagements, focusing on overall work/home/life balance, and focusing on positive physical and emotional wellness.   Suicidal/Homicidal: No  Therapist Response:Jake Brown is continuing to develop skills for managing stress, solving daily problems, and resolving conflicts effectively; Jake Brown is learning to implement calming skills to reduce overall tension. Jake Brown is sharing his feelings and personal experience with depression in order to clarify and gain insight as to what is causing/triggering depression. These behaviors are reflective of continuing personal growth and progress.  Plan: Return again in 4 weeks.  Diagnosis: Axis I: Bipolar, Depressed    Axis II: No diagnosis    [place], LCSW 12/11/2020

## 2020-12-12 DIAGNOSIS — M4726 Other spondylosis with radiculopathy, lumbar region: Secondary | ICD-10-CM | POA: Diagnosis not present

## 2020-12-12 DIAGNOSIS — G894 Chronic pain syndrome: Secondary | ICD-10-CM | POA: Diagnosis not present

## 2020-12-12 DIAGNOSIS — M4802 Spinal stenosis, cervical region: Secondary | ICD-10-CM | POA: Diagnosis not present

## 2020-12-12 DIAGNOSIS — Z79891 Long term (current) use of opiate analgesic: Secondary | ICD-10-CM | POA: Diagnosis not present

## 2020-12-28 DIAGNOSIS — Z8249 Family history of ischemic heart disease and other diseases of the circulatory system: Secondary | ICD-10-CM | POA: Diagnosis not present

## 2020-12-28 DIAGNOSIS — I69354 Hemiplegia and hemiparesis following cerebral infarction affecting left non-dominant side: Secondary | ICD-10-CM | POA: Diagnosis not present

## 2020-12-28 DIAGNOSIS — G43719 Chronic migraine without aura, intractable, without status migrainosus: Secondary | ICD-10-CM | POA: Diagnosis not present

## 2020-12-28 DIAGNOSIS — M5136 Other intervertebral disc degeneration, lumbar region: Secondary | ICD-10-CM | POA: Diagnosis not present

## 2021-01-04 ENCOUNTER — Other Ambulatory Visit: Payer: Self-pay | Admitting: Student

## 2021-01-04 DIAGNOSIS — Z8249 Family history of ischemic heart disease and other diseases of the circulatory system: Secondary | ICD-10-CM

## 2021-01-04 DIAGNOSIS — G43719 Chronic migraine without aura, intractable, without status migrainosus: Secondary | ICD-10-CM

## 2021-01-07 NOTE — Telephone Encounter (Signed)
Tammy, please advise. Thanks 

## 2021-01-08 ENCOUNTER — Other Ambulatory Visit: Payer: Self-pay

## 2021-01-08 ENCOUNTER — Ambulatory Visit
Admission: RE | Admit: 2021-01-08 | Discharge: 2021-01-08 | Disposition: A | Discharge status: Home / Self Care | Payer: Medicare Other | Source: Ambulatory Visit | Attending: Student | Admitting: Student

## 2021-01-08 ENCOUNTER — Ambulatory Visit (INDEPENDENT_AMBULATORY_CARE_PROVIDER_SITE_OTHER): Payer: Medicare Other | Admitting: Licensed Clinical Social Worker

## 2021-01-08 DIAGNOSIS — G43719 Chronic migraine without aura, intractable, without status migrainosus: Secondary | ICD-10-CM | POA: Insufficient documentation

## 2021-01-08 DIAGNOSIS — I671 Cerebral aneurysm, nonruptured: Secondary | ICD-10-CM | POA: Diagnosis not present

## 2021-01-08 DIAGNOSIS — F3175 Bipolar disorder, in partial remission, most recent episode depressed: Secondary | ICD-10-CM | POA: Diagnosis not present

## 2021-01-08 DIAGNOSIS — F410 Panic disorder [episodic paroxysmal anxiety] without agoraphobia: Secondary | ICD-10-CM | POA: Diagnosis not present

## 2021-01-08 DIAGNOSIS — Z8249 Family history of ischemic heart disease and other diseases of the circulatory system: Secondary | ICD-10-CM | POA: Diagnosis not present

## 2021-01-08 NOTE — Progress Notes (Addendum)
Virtual Visit via Video Brown  I connected with Jake Brown on 01/08/21 at  9:00 AM EDT by a video enabled telemedicine application and verified that I am speaking with the correct person using two identifiers.  Location: Patient: home Provider: remote office [place], [place])   I discussed the limitations of evaluation and management by telemedicine and the availability of in person appointments. The patient expressed understanding and agreed to proceed.  I discussed the assessment and treatment plan with the patient. The patient was provided an opportunity to ask questions and all were answered. The patient agreed with the plan and demonstrated an understanding of the instructions.   The patient was advised to call back or seek an in-person evaluation if the symptoms worsen or if the condition fails to improve as anticipated.  I provided 45 minutes of non-face-to-face time during this encounter.   Jake Chiaramonte [name], LCSW   THERAPIST PROGRESS Brown  Session Time: 9-9:45a  Participation Level: Active  Behavioral Response: NeatAlertAnxious and Depressed  Type of Therapy: Individual Therapy  Treatment Goals addressed: Anxiety and Coping  Interventions: CBT and Reframing  Summary: Jake Brown is a 55 y.o. male who presents with improving symptoms related to bipolar disorder. Pt reports that overall mood has been stable and that he is managing situational stressors well. Pt reports that he has been using CBD to help him sleep, and it has been working well.  Allowed pt to explore and express thoughts and feelings associated with recent life situations and external stressors. Discussed recent situation where pt was with his mother and other family members for a memorial day celebration/cookout. Pt had gotten into a verbal argument with mother a few days prior (on the phone) so the atmosphere was a bit strained when they were there face to face.  Pt still had a great time  fishing with cousin and uncle.   Discussed health related concerns and some trauma from past.   Jake Brown did independent research on light therapy (discussed last session). Jake Brown is interested in trying this in the winter months.   Continued recommendations are as follows: self care behaviors, positive social engagements, focusing on overall work/home/life balance, and focusing on positive physical and emotional wellness. .   Suicidal/Homicidal: No  Therapist Response: Jake Brown is continuing to develop skills for managing stress, solving daily problems, and resolving conflicts effectively; Jake Brown is learning to implement calming skills to reduce overall tension. Jake Brown is sharing his feelings and personal experience with depression in order to clarify and gain insight as to what is causing/triggering depression. These behaviors are reflective of continuing personal growth and progress.  Plan: Return again in 4 weeks.  Diagnosis: Axis I: Bipolar, Depressed and Panic Disorder    Axis II: No diagnosis    Jake Bo [name], LCSW 01/08/2021

## 2021-01-08 NOTE — Telephone Encounter (Signed)
Is he talking about the inspire device for sleep apnea if so his BMI needs to be below 35 which he last visit was at over 40 So he would not be a candidate for this.  Please make sure he has a follow-up visit for his sleep apnea and BiPAP.

## 2021-01-15 DIAGNOSIS — M48062 Spinal stenosis, lumbar region with neurogenic claudication: Secondary | ICD-10-CM | POA: Diagnosis not present

## 2021-01-15 DIAGNOSIS — Z79891 Long term (current) use of opiate analgesic: Secondary | ICD-10-CM | POA: Diagnosis not present

## 2021-01-15 DIAGNOSIS — M4726 Other spondylosis with radiculopathy, lumbar region: Secondary | ICD-10-CM | POA: Diagnosis not present

## 2021-01-15 DIAGNOSIS — G894 Chronic pain syndrome: Secondary | ICD-10-CM | POA: Diagnosis not present

## 2021-01-21 ENCOUNTER — Encounter: Payer: Self-pay | Admitting: Internal Medicine

## 2021-01-22 NOTE — Telephone Encounter (Signed)
Please advise 

## 2021-02-06 DIAGNOSIS — M25511 Pain in right shoulder: Secondary | ICD-10-CM | POA: Diagnosis not present

## 2021-02-06 DIAGNOSIS — M19011 Primary osteoarthritis, right shoulder: Secondary | ICD-10-CM | POA: Diagnosis not present

## 2021-02-06 DIAGNOSIS — N183 Chronic kidney disease, stage 3 unspecified: Secondary | ICD-10-CM | POA: Diagnosis not present

## 2021-02-06 DIAGNOSIS — M25411 Effusion, right shoulder: Secondary | ICD-10-CM | POA: Diagnosis not present

## 2021-02-06 DIAGNOSIS — N2889 Other specified disorders of kidney and ureter: Secondary | ICD-10-CM | POA: Diagnosis not present

## 2021-02-06 DIAGNOSIS — G8929 Other chronic pain: Secondary | ICD-10-CM | POA: Diagnosis not present

## 2021-02-06 DIAGNOSIS — I151 Hypertension secondary to other renal disorders: Secondary | ICD-10-CM | POA: Diagnosis not present

## 2021-02-12 DIAGNOSIS — M4726 Other spondylosis with radiculopathy, lumbar region: Secondary | ICD-10-CM | POA: Diagnosis not present

## 2021-02-12 DIAGNOSIS — M48062 Spinal stenosis, lumbar region with neurogenic claudication: Secondary | ICD-10-CM | POA: Diagnosis not present

## 2021-02-12 DIAGNOSIS — Z79891 Long term (current) use of opiate analgesic: Secondary | ICD-10-CM | POA: Diagnosis not present

## 2021-02-12 DIAGNOSIS — G894 Chronic pain syndrome: Secondary | ICD-10-CM | POA: Diagnosis not present

## 2021-02-15 ENCOUNTER — Other Ambulatory Visit: Payer: Self-pay

## 2021-02-15 ENCOUNTER — Ambulatory Visit (INDEPENDENT_AMBULATORY_CARE_PROVIDER_SITE_OTHER): Payer: Self-pay | Admitting: Licensed Clinical Social Worker

## 2021-02-15 DIAGNOSIS — Z5329 Procedure and treatment not carried out because of patient's decision for other reasons: Secondary | ICD-10-CM

## 2021-02-15 NOTE — Progress Notes (Signed)
LCSW counselor tried to connect with patient for scheduled appointment via MyChart video text request x 2 and email request; also tried to connect via phone without success. LCSW counselor left message for patient to call office number to reschedule OPT appointment.

## 2021-02-21 ENCOUNTER — Ambulatory Visit (INDEPENDENT_AMBULATORY_CARE_PROVIDER_SITE_OTHER): Payer: Medicare Other | Admitting: Internal Medicine

## 2021-02-21 ENCOUNTER — Encounter: Payer: Self-pay | Admitting: Internal Medicine

## 2021-02-21 ENCOUNTER — Other Ambulatory Visit: Payer: Self-pay

## 2021-02-21 VITALS — BP 130/80 | HR 73 | Temp 96.9°F | Ht 68.0 in | Wt 311.0 lb

## 2021-02-21 DIAGNOSIS — R7303 Prediabetes: Secondary | ICD-10-CM | POA: Diagnosis not present

## 2021-02-21 DIAGNOSIS — Z6841 Body Mass Index (BMI) 40.0 and over, adult: Secondary | ICD-10-CM | POA: Diagnosis not present

## 2021-02-21 DIAGNOSIS — I10 Essential (primary) hypertension: Secondary | ICD-10-CM

## 2021-02-21 LAB — LIPID PANEL
Cholesterol: 127 mg/dL (ref 0–200)
HDL: 47.3 mg/dL (ref >39.00)
LDL Cholesterol: 55 mg/dL (ref 0–99)
NonHDL: 79.76
Total CHOL/HDL Ratio: 3
Triglycerides: 125 mg/dL (ref 0.0–149.0)
VLDL: 25 mg/dL (ref 0.0–40.0)

## 2021-02-21 LAB — COMPREHENSIVE METABOLIC PANEL
ALT: 19 U/L (ref 0–53)
AST: 17 U/L (ref 0–37)
Albumin: 4 g/dL (ref 3.5–5.2)
Alkaline Phosphatase: 92 U/L (ref 39–117)
BUN: 22 mg/dL (ref 6–23)
CO2: 27 mEq/L (ref 19–32)
Calcium: 9.4 mg/dL (ref 8.4–10.5)
Chloride: 101 mEq/L (ref 96–112)
Creatinine, Ser: 1.48 mg/dL (ref 0.40–1.50)
GFR: 52.24 mL/min — ABNORMAL LOW (ref >60.00)
Glucose, Bld: 107 mg/dL — ABNORMAL HIGH (ref 70–99)
Potassium: 4.4 mEq/L (ref 3.5–5.1)
Sodium: 138 mEq/L (ref 135–145)
Total Bilirubin: 0.7 mg/dL (ref 0.2–1.2)
Total Protein: 6.8 g/dL (ref 6.0–8.3)

## 2021-02-21 LAB — CBC WITH DIFFERENTIAL/PLATELET
Basophils Absolute: 0.1 10*3/uL (ref 0.0–0.1)
Basophils Relative: 0.7 % (ref 0.0–3.0)
Eosinophils Absolute: 0.3 10*3/uL (ref 0.0–0.7)
Eosinophils Relative: 3.2 % (ref 0.0–5.0)
HCT: 45.7 % (ref 39.0–52.0)
Hemoglobin: 14.9 g/dL (ref 13.0–17.0)
Lymphocytes Relative: 14.6 % (ref 12.0–46.0)
Lymphs Abs: 1.6 10*3/uL (ref 0.7–4.0)
MCHC: 32.6 g/dL (ref 30.0–36.0)
MCV: 90.5 fl (ref 78.0–100.0)
Monocytes Absolute: 0.5 10*3/uL (ref 0.1–1.0)
Monocytes Relative: 5.1 % (ref 3.0–12.0)
Neutro Abs: 8.2 10*3/uL — ABNORMAL HIGH (ref 1.4–7.7)
Neutrophils Relative %: 76.4 % (ref 43.0–77.0)
Platelets: 234 10*3/uL (ref 150.0–400.0)
RBC: 5.05 Mil/uL (ref 4.22–5.81)
RDW: 15.2 % (ref 11.5–15.5)
WBC: 10.7 10*3/uL — ABNORMAL HIGH (ref 4.0–10.5)

## 2021-02-21 LAB — HEMOGLOBIN A1C: Hgb A1c MFr Bld: 6 % (ref 4.6–6.5)

## 2021-02-21 MED ORDER — CARVEDILOL 25 MG PO TABS: 25.0000 mg | ORAL_TABLET | Freq: Two times a day (BID) | ORAL | 3 refills | Status: DC

## 2021-02-21 NOTE — Patient Instructions (Addendum)
[facility]   [place], [place] [address]   [place] [place] [place]   [place], [place] [address]   [phone]   [phone] (Fax)    Call to get colonoscopy rescheduled   [facility] [phone]  Dr. Flavia Shipper, Dr. Kaylyn Lim, Dr. Alisia Ferrari
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
## 2021-02-25 ENCOUNTER — Telehealth: Payer: Self-pay

## 2021-02-25 NOTE — Telephone Encounter (Signed)
LMTCB in regards to lab results.  

## 2021-03-01 DIAGNOSIS — I639 Cerebral infarction, unspecified: Secondary | ICD-10-CM | POA: Diagnosis not present

## 2021-03-01 DIAGNOSIS — I10 Essential (primary) hypertension: Secondary | ICD-10-CM | POA: Diagnosis not present

## 2021-03-01 DIAGNOSIS — I341 Nonrheumatic mitral (valve) prolapse: Secondary | ICD-10-CM | POA: Diagnosis not present

## 2021-03-13 DIAGNOSIS — M48062 Spinal stenosis, lumbar region with neurogenic claudication: Secondary | ICD-10-CM | POA: Diagnosis not present

## 2021-03-13 DIAGNOSIS — G894 Chronic pain syndrome: Secondary | ICD-10-CM | POA: Diagnosis not present

## 2021-03-13 DIAGNOSIS — M5412 Radiculopathy, cervical region: Secondary | ICD-10-CM | POA: Diagnosis not present

## 2021-03-13 DIAGNOSIS — M4726 Other spondylosis with radiculopathy, lumbar region: Secondary | ICD-10-CM | POA: Diagnosis not present

## 2021-03-29 DIAGNOSIS — I341 Nonrheumatic mitral (valve) prolapse: Secondary | ICD-10-CM | POA: Diagnosis not present

## 2021-03-29 DIAGNOSIS — I639 Cerebral infarction, unspecified: Secondary | ICD-10-CM | POA: Diagnosis not present

## 2021-04-01 DIAGNOSIS — R002 Palpitations: Secondary | ICD-10-CM | POA: Diagnosis not present

## 2021-04-01 DIAGNOSIS — I639 Cerebral infarction, unspecified: Secondary | ICD-10-CM | POA: Diagnosis not present

## 2021-04-16 ENCOUNTER — Ambulatory Visit: Payer: Medicare Other | Attending: Internal Medicine

## 2021-04-16 ENCOUNTER — Other Ambulatory Visit: Payer: Self-pay

## 2021-04-16 DIAGNOSIS — Z23 Encounter for immunization: Secondary | ICD-10-CM

## 2021-04-16 MED ORDER — PFIZER COVID-19 VAC BIVALENT 30 MCG/0.3ML IM SUSP
INTRAMUSCULAR | 0 refills | Status: DC
  Filled 2021-04-16: qty 0.3, 1d supply, fill #0

## 2021-04-16 NOTE — Progress Notes (Signed)
   Covid-19 Vaccination Clinic  Name:  Jake Brown    MRN: [id number] DOB: February 02, 1966  04/16/2021  [name] was observed post Covid-19 immunization for 15 minutes without incident. He was provided with Vaccine Information Sheet and instruction to access the V-Safe system.   [name] was instructed to call 911 with any severe reactions post vaccine: Difficulty breathing  Swelling of face and throat  A fast heartbeat  A bad rash all over body  Dizziness and weakness   Lu Duffel, PharmD, MBA Clinical Acute Care Pharmacist

## 2021-04-18 DIAGNOSIS — G4733 Obstructive sleep apnea (adult) (pediatric): Secondary | ICD-10-CM | POA: Diagnosis not present

## 2021-05-15 ENCOUNTER — Other Ambulatory Visit: Payer: Self-pay | Admitting: Internal Medicine

## 2021-05-15 DIAGNOSIS — F32A Depression, unspecified: Secondary | ICD-10-CM

## 2021-05-15 DIAGNOSIS — F419 Anxiety disorder, unspecified: Secondary | ICD-10-CM

## 2021-05-18 DIAGNOSIS — G4733 Obstructive sleep apnea (adult) (pediatric): Secondary | ICD-10-CM | POA: Diagnosis not present

## 2021-05-30 DIAGNOSIS — I639 Cerebral infarction, unspecified: Secondary | ICD-10-CM | POA: Diagnosis not present

## 2021-05-30 DIAGNOSIS — I10 Essential (primary) hypertension: Secondary | ICD-10-CM | POA: Diagnosis not present

## 2021-05-30 DIAGNOSIS — Z23 Encounter for immunization: Secondary | ICD-10-CM | POA: Diagnosis not present

## 2021-05-30 DIAGNOSIS — I341 Nonrheumatic mitral (valve) prolapse: Secondary | ICD-10-CM | POA: Diagnosis not present

## 2021-06-12 DIAGNOSIS — M7521 Bicipital tendinitis, right shoulder: Secondary | ICD-10-CM | POA: Diagnosis not present

## 2021-06-12 DIAGNOSIS — M7581 Other shoulder lesions, right shoulder: Secondary | ICD-10-CM | POA: Diagnosis not present

## 2021-06-12 DIAGNOSIS — M19011 Primary osteoarthritis, right shoulder: Secondary | ICD-10-CM | POA: Diagnosis not present

## 2021-06-12 DIAGNOSIS — M25511 Pain in right shoulder: Secondary | ICD-10-CM | POA: Diagnosis not present

## 2021-06-17 DIAGNOSIS — H524 Presbyopia: Secondary | ICD-10-CM | POA: Diagnosis not present

## 2021-06-18 DIAGNOSIS — G4733 Obstructive sleep apnea (adult) (pediatric): Secondary | ICD-10-CM | POA: Diagnosis not present

## 2021-06-24 ENCOUNTER — Other Ambulatory Visit: Payer: Self-pay

## 2021-06-24 ENCOUNTER — Ambulatory Visit (INDEPENDENT_AMBULATORY_CARE_PROVIDER_SITE_OTHER): Payer: Medicare Other

## 2021-06-24 DIAGNOSIS — Z23 Encounter for immunization: Secondary | ICD-10-CM

## 2021-07-10 DIAGNOSIS — L600 Ingrowing nail: Secondary | ICD-10-CM | POA: Diagnosis not present

## 2021-07-10 DIAGNOSIS — M79675 Pain in left toe(s): Secondary | ICD-10-CM | POA: Diagnosis not present

## 2021-07-10 DIAGNOSIS — M79674 Pain in right toe(s): Secondary | ICD-10-CM | POA: Diagnosis not present

## 2021-07-10 DIAGNOSIS — B351 Tinea unguium: Secondary | ICD-10-CM | POA: Diagnosis not present

## 2021-07-14 ENCOUNTER — Other Ambulatory Visit: Payer: Self-pay | Admitting: Internal Medicine

## 2021-07-14 DIAGNOSIS — I10 Essential (primary) hypertension: Secondary | ICD-10-CM

## 2021-07-16 ENCOUNTER — Encounter: Payer: Medicare Other | Admitting: Internal Medicine

## 2021-07-18 ENCOUNTER — Ambulatory Visit: Payer: Medicare Other | Admitting: Adult Health

## 2021-08-21 ENCOUNTER — Encounter: Payer: Self-pay | Admitting: Internal Medicine

## 2021-08-21 ENCOUNTER — Other Ambulatory Visit: Payer: Self-pay

## 2021-08-21 ENCOUNTER — Ambulatory Visit (INDEPENDENT_AMBULATORY_CARE_PROVIDER_SITE_OTHER): Payer: Medicare Other | Admitting: Internal Medicine

## 2021-08-21 VITALS — BP 150/90 | HR 89 | Temp 97.9°F | Resp 16 | Ht 68.0 in | Wt 313.4 lb

## 2021-08-21 DIAGNOSIS — R7303 Prediabetes: Secondary | ICD-10-CM

## 2021-08-21 DIAGNOSIS — M25561 Pain in right knee: Secondary | ICD-10-CM | POA: Diagnosis not present

## 2021-08-21 DIAGNOSIS — N1831 Chronic kidney disease, stage 3a: Secondary | ICD-10-CM

## 2021-08-21 DIAGNOSIS — E559 Vitamin D deficiency, unspecified: Secondary | ICD-10-CM

## 2021-08-21 DIAGNOSIS — I10 Essential (primary) hypertension: Secondary | ICD-10-CM

## 2021-08-21 DIAGNOSIS — Z125 Encounter for screening for malignant neoplasm of prostate: Secondary | ICD-10-CM

## 2021-08-21 DIAGNOSIS — Z0001 Encounter for general adult medical examination with abnormal findings: Secondary | ICD-10-CM

## 2021-08-21 DIAGNOSIS — G8929 Other chronic pain: Secondary | ICD-10-CM

## 2021-08-21 DIAGNOSIS — R351 Nocturia: Secondary | ICD-10-CM | POA: Diagnosis not present

## 2021-08-21 DIAGNOSIS — Z1389 Encounter for screening for other disorder: Secondary | ICD-10-CM

## 2021-08-21 DIAGNOSIS — Z1211 Encounter for screening for malignant neoplasm of colon: Secondary | ICD-10-CM

## 2021-08-21 DIAGNOSIS — Z1329 Encounter for screening for other suspected endocrine disorder: Secondary | ICD-10-CM

## 2021-08-21 MED ORDER — LISINOPRIL 20 MG PO TABS: 20.0000 mg | ORAL_TABLET | Freq: Every day | ORAL | 3 refills | Status: DC

## 2021-08-21 NOTE — Patient Instructions (Addendum)
Clenpiq Bowel Preparation for colonoscopy  Or 2 day prep   Aspercream with lidocaine or voltaren gel right shoulder  Lidocaine pain patch  Naturewise tumeric/ginger/curcumin   Increase lisinopril to 20 mg (1) or 2-10 mg pills daily   [facility] clinic GI  05/09/2019 Telephone [facility]   [place], [place] [address]   [place], [place], [place]   [address]   [place], [place] [address]   [phone]   [phone] (Fax)   Reschedule colonoscopy    Varicose Veins Varicose veins are veins that have become enlarged, bulged, and twisted. They most often appear in the legs. What are the causes? This condition is caused by damage to the valves in the vein. These valves help blood return to your heart. When they are damaged and they stop working properly, blood may flow backward and back up in the veins near the skin, causing the veins to get larger and appear twisted. The condition can result from any issue that causes blood to back up, like pregnancy, prolonged standing, or obesity. What increases the risk? The following factors may make you more likely to develop this condition: Being on your feet a lot. Being pregnant. Being overweight. Smoking. Having had a previous deep vein thrombosis or having a thrombotic disorder. Aging. The risk increases with age. Having a condition called Klippel-Trenaunay syndrome. What are the signs or symptoms? Symptoms of this condition include: Bulging, twisted, and bluish veins. A feeling of heaviness in your legs. This may be worse at the end of the day. Leg pain. This may be worse at the end of the day. Swelling in the leg. Changes in skin color over the veins. Swelling or pain in the legs can limit your activities. Your symptoms may get worse when you sit or stand for long periods of time. How is this diagnosed? This condition may be diagnosed based on: Your symptoms, family history,  activity levels, and lifestyle. A physical exam. You may also have tests, including an ultrasound or X-ray. How is this treated? Treatment for this condition may involve: Avoiding sitting or standing in one position for long periods of time. Wearing compression stockings. These stockings help to prevent blood clots and reduce swelling in the legs. Raising (elevating) the legs when resting. Losing weight. Exercising regularly. If you have persistent symptoms or want to improve the way your varicose veins look, you may choose to have a procedure to close the varicose veins off or to remove them. Nonsurgical treatments to close off the veins include: Sclerotherapy. In this treatment, a solution is injected into a vein to close it off. Laser treatment. The vein is heated with a laser to close it off. Radiofrequency vein ablation. An electrical current produced by radio waves is used to close off the vein. Surgical treatments to remove the veins include: Phlebectomy. In this procedure, the veins are removed through small incisions made over the veins. Vein ligation and stripping. In this procedure, incisions are made over the veins. The veins are then removed after being tied (ligated) with stitches (sutures). Follow these instructions at home: Medicines Take over-the-counter and prescription medicines only as told by your health care provider. If you were prescribed an antibiotic medicine, use it as told by your health care provider. Do not stop using the antibiotic even if you start to feel better. Activity Walk as much as possible. Walking increases blood flow. This helps blood return to the heart and  takes pressure off your veins. Do not stand or sit in one position for a long period of time. Do not sit with your legs crossed. Avoid sitting for a long time without moving. Get up to take short walks every 1-2 hours. This is important to improve blood flow and breathing. Ask for help if you  feel weak or unsteady. Return to your normal activities as told by your health care provider. Ask your health care provider what activities are safe for you. Do exercises as told by your health care provider. General instructions  Follow any diet instructions given to you by your health care provider. Elevate your legs at night to above the level of your heart. If you get a cut in the skin over the varicose vein and the vein bleeds: Lie down with your leg raised. Apply firm pressure to the cut with a clean cloth until the bleeding stops. Place a bandage (dressing) on the cut. Drink enough fluid to keep your urine pale yellow. Do not use any products that contain nicotine or tobacco. These products include cigarettes, chewing tobacco, and vaping devices, such as e-cigarettes. If you need help quitting, ask your health care provider. Wear compression stockings as told by your health care provider. Do not wear other kinds of tight clothing around your legs, pelvis, or waist. Keep all follow-up visits. This is important. Contact a health care provider if: The skin around your varicose veins starts to break down. You have more pain, redness, tenderness, or hard swelling over a vein. You are uncomfortable because of pain. You get a cut in the skin over a varicose vein and it will not stop bleeding. Get help right away if: You have chest pain. You have trouble breathing. You have severe leg pain. Summary Varicose veins are veins that have become enlarged, bulged, and twisted. They most often appear in the legs. This condition is caused by damage to the valves in the vein. These valves help blood return to your heart. Treatment for this condition includes frequent movements, wearing compression stockings, losing weight, and exercising regularly. In some cases, procedures are done to close off or remove the veins. Nonsurgical treatments to close off the veins include sclerotherapy, laser therapy,  and radiofrequency vein ablation. This information is not intended to replace advice given to you by your health care provider. Make sure you discuss any questions you have with your health care provider. Document Revised: 12/19/2020 Document Reviewed: 12/19/2020 Elsevier Patient Education  2022 [place].  Consider PT right shoulder  06/12/2021 Office Visit [facility]   [place], [place] [address]   [phone]   Poggi, Smith Mince, MD   [place]   [facility] [place], [place] [address]   [phone] (Work)   [phone] (Fax)     Right knee pain Dr. Marry Guan or [facility]   Shoulder Exercises Ask your health care provider which exercises are safe for you. Do exercises exactly as told by your health care provider and adjust them as directed. It is normal to feel mild stretching, pulling, tightness, or discomfort as you do these exercises. Stop right away if you feel sudden pain or your pain gets worse. Do not begin these exercises until told by your health care provider. Stretching exercises External rotation and abduction This exercise is sometimes called corner stretch. This exercise rotates your arm outward (external rotation) and moves your arm out from your body (abduction). Stand in a  doorway with one of your feet slightly in front of the other. This is called a staggered stance. If you cannot reach your forearms to the door frame, stand facing a corner of a room. Choose one of the following positions as told by your health care provider: Place your hands and forearms on the door frame above your head. Place your hands and forearms on the door frame at the height of your head. Place your hands on the door frame at the height of your elbows. Slowly move your weight onto your front foot until you feel a stretch across your chest and in the front of your shoulders. Keep your head and chest upright and keep your abdominal muscles  tight. Hold for __________ seconds. To release the stretch, shift your weight to your back foot. Repeat __________ times. Complete this exercise __________ times a day. Extension, standing Stand and hold a broomstick, a cane, or a similar object behind your back. Your hands should be a little wider than shoulder width apart. Your palms should face away from your back. Keeping your elbows straight and your shoulder muscles relaxed, move the stick away from your body until you feel a stretch in your shoulders (extension). Avoid shrugging your shoulders while you move the stick. Keep your shoulder blades tucked down toward the middle of your back. Hold for __________ seconds. Slowly return to the starting position. Repeat __________ times. Complete this exercise __________ times a day. Range-of-motion exercises Pendulum  Stand near a wall or a surface that you can hold onto for balance. Bend at the waist and let your left / right arm hang straight down. Use your other arm to support you. Keep your back straight and do not lock your knees. Relax your left / right arm and shoulder muscles, and move your hips and your trunk so your left / right arm swings freely. Your arm should swing because of the motion of your body, not because you are using your arm or shoulder muscles. Keep moving your hips and trunk so your arm swings in the following directions, as told by your health care provider: Side to side. Forward and backward. In clockwise and counterclockwise circles. Continue each motion for __________ seconds, or for as long as told by your health care provider. Slowly return to the starting position. Repeat __________ times. Complete this exercise __________ times a day. Shoulder flexion, standing  Stand and hold a broomstick, a cane, or a similar object. Place your hands a little more than shoulder width apart on the object. Your left / right hand should be palm up, and your other hand  should be palm down. Keep your elbow straight and your shoulder muscles relaxed. Push the stick up with your healthy arm to raise your left / right arm in front of your body, and then over your head until you feel a stretch in your shoulder (flexion). Avoid shrugging your shoulder while you raise your arm. Keep your shoulder blade tucked down toward the middle of your back. Hold for __________ seconds. Slowly return to the starting position. Repeat __________ times. Complete this exercise __________ times a day. Shoulder abduction, standing Stand and hold a broomstick, a cane, or a similar object. Place your hands a little more than shoulder width apart on the object. Your left / right hand should be palm up, and your other hand should be palm down. Keep your elbow straight and your shoulder muscles relaxed. Push the object across your body toward your  left / right side. Raise your left / right arm to the side of your body (abduction) until you feel a stretch in your shoulder. Do not raise your arm above shoulder height unless your health care provider tells you to do that. If directed, raise your arm over your head. Avoid shrugging your shoulder while you raise your arm. Keep your shoulder blade tucked down toward the middle of your back. Hold for __________ seconds. Slowly return to the starting position. Repeat __________ times. Complete this exercise __________ times a day. Internal rotation  Place your left / right hand behind your back, palm up. Use your other hand to dangle an exercise band, a towel, or a similar object over your shoulder. Grasp the band with your left / right hand so you are holding on to both ends. Gently pull up on the band until you feel a stretch in the front of your left / right shoulder. The movement of your arm toward the center of your body is called internal rotation. Avoid shrugging your shoulder while you raise your arm. Keep your shoulder blade tucked down  toward the middle of your back. Hold for __________ seconds. Release the stretch by letting go of the band and lowering your hands. Repeat __________ times. Complete this exercise __________ times a day. Strengthening exercises External rotation  Sit in a stable chair without armrests. Secure an exercise band to a stable object at elbow height on your left / right side. Place a soft object, such as a folded towel or a small pillow, between your left / right upper arm and your body to move your elbow about 4 inches (10 cm) away from your side. Hold the end of the exercise band so it is tight and there is no slack. Keeping your elbow pressed against the soft object, slowly move your forearm out, away from your abdomen (external rotation). Keep your body steady so only your forearm moves. Hold for __________ seconds. Slowly return to the starting position. Repeat __________ times. Complete this exercise __________ times a day. Shoulder abduction  Sit in a stable chair without armrests, or stand up. Hold a __________ weight in your left / right hand, or hold an exercise band with both hands. Start with your arms straight down and your left / right palm facing in, toward your body. Slowly lift your left / right hand out to your side (abduction). Do not lift your hand above shoulder height unless your health care provider tells you that this is safe. Keep your arms straight. Avoid shrugging your shoulder while you do this movement. Keep your shoulder blade tucked down toward the middle of your back. Hold for __________ seconds. Slowly lower your arm, and return to the starting position. Repeat __________ times. Complete this exercise __________ times a day. Shoulder extension Sit in a stable chair without armrests, or stand up. Secure an exercise band to a stable object in front of you so it is at shoulder height. Hold one end of the exercise band in each hand. Your palms should face each  other. Straighten your elbows and lift your hands up to shoulder height. Step back, away from the secured end of the exercise band, until the band is tight and there is no slack. Squeeze your shoulder blades together as you pull your hands down to the sides of your thighs (extension). Stop when your hands are straight down by your sides. Do not let your hands go behind your body. Hold for  __________ seconds. Slowly return to the starting position. Repeat __________ times. Complete this exercise __________ times a day. Shoulder row Sit in a stable chair without armrests, or stand up. Secure an exercise band to a stable object in front of you so it is at waist height. Hold one end of the exercise band in each hand. Position your palms so that your thumbs are facing the ceiling (neutral position). Bend each of your elbows to a 90-degree angle (right angle) and keep your upper arms at your sides. Step back until the band is tight and there is no slack. Slowly pull your elbows back behind you. Hold for __________ seconds. Slowly return to the starting position. Repeat __________ times. Complete this exercise __________ times a day. Shoulder press-ups  Sit in a stable chair that has armrests. Sit upright, with your feet flat on the floor. Put your hands on the armrests so your elbows are bent and your fingers are pointing forward. Your hands should be about even with the sides of your body. Push down on the armrests and use your arms to lift yourself off the chair. Straighten your elbows and lift yourself up as much as you comfortably can. Move your shoulder blades down, and avoid letting your shoulders move up toward your ears. Keep your feet on the ground. As you get stronger, your feet should support less of your body weight as you lift yourself up. Hold for __________ seconds. Slowly lower yourself back into the chair. Repeat __________ times. Complete this exercise __________ times a  day. Wall push-ups  Stand so you are facing a stable wall. Your feet should be about one arm-length away from the wall. Lean forward and place your palms on the wall at shoulder height. Keep your feet flat on the floor as you bend your elbows and lean forward toward the wall. Hold for __________ seconds. Straighten your elbows to push yourself back to the starting position. Repeat __________ times. Complete this exercise __________ times a day. This information is not intended to replace advice given to you by your health care provider. Make sure you discuss any questions you have with your health care provider. Document Revised: 10/29/2018 Document Reviewed: 08/06/2018 Elsevier Patient Education  [place].  Shoulder Range of Motion Exercises Shoulder range of motion (ROM) exercises are done to keep the shoulder moving freely or to increase movement. They are often recommended for people who have shoulder pain or stiffness or who are recovering from a shoulder surgery. Phase 1 exercises When you are able, do this exercise 1-2 times per day for 30-60 seconds in each direction, or as directed by your health care provider. Pendulum exercise To do this exercise while sitting: Sit in a chair or at the edge of your bed with your feet flat on the floor. Let your affected arm hang down in front of you over the edge of the bed or chair. Relax your shoulder, arm, and hand. Rock your body so your arm gently swings in small circles. You can also use your unaffected arm to start the motion. Repeat changing the direction of the circles, swinging your arm left and right, and swinging your arm forward and back. To do this exercise while standing: Stand next to a sturdy chair or table, and hold on to it with your hand on your unaffected side. Bend forward at the waist. Bend your knees slightly. Relax your shoulder, arm, and hand. While keeping your shoulder relaxed, use body motion to  swing  your arm in small circles. Repeat changing the direction of the circles, swinging your arm left and right, and swinging your arm forward and back. Between exercises, stand up tall and take a short break to relax your lower back.  Phase 2 exercises Do these exercises 1-2 times per day or as told by your health care provider. Hold each stretch for 30 seconds, and repeat 3 times. Do the exercises with one or both arms as instructed by your health care provider. For these exercises, sit at a table with your hand and arm supported by the table. A chair that slides easily or has wheels can be helpful. External rotation Turn your chair so that your affected side is nearest to the table. Place your forearm on the table to your side. Bend your elbow about 90 at the elbow (right angle) and place your hand palm facing down on the table. Your elbow should be about 6 inches away from your side. Keeping your arm on the table, lean your body forward. Abduction Turn your chair so that your affected side is nearest to the table. Place your forearm and hand on the table so that your thumb points toward the ceiling and your arm is straight out to your side. Slide your hand out to the side and away from you, using your unaffected arm to do the work. To increase the stretch, you can slide your chair away from the table. Flexion: forward stretch Sit facing the table. Place your hand and elbow on the table in front of you. Slide your hand forward and away from you, using your unaffected arm to do the work. To increase the stretch, you can slide your chair backward. Phase 3 exercises Do these exercises 1-2 times per day or as told by your health care provider. Hold each stretch for 30 seconds, and repeat 3 times. Do the exercises with one or both arms as instructed by your health care provider. Cross-body stretch: posterior capsule stretch Lift your arm straight out in front of you. Bend your arm 90 at the elbow  (right angle) so your forearm moves across your body. Use your other arm to gently pull the elbow across your body, toward your other shoulder. Wall climbs Stand with your affected arm extended out to the side with your hand resting on a door frame. Slide your hand slowly up the door frame. To increase the stretch, step through the door frame. Keep your body upright and do not lean. Wand exercises You will need a cane, a piece of PVC pipe, or a sturdy wooden dowel for wand exercises. Flexion To do this exercise while standing: Hold the wand with both of your hands, palms down. Using the other arm to help, lift your arms up and over your head, if able. Push upward with your other arm to gently increase the stretch. To do this exercise while lying down: Lie on your back with your elbows resting on the floor and the wand in both your hands. Your hands will be palm down, or pointing toward your feet. Lift your hands toward the ceiling, using your unaffected arm to help if needed. Bring your arms overhead as able, using your unaffected arm to help if needed. Internal rotation Stand while holding the wand behind you with both hands. Your unaffected arm should be extended above your head with the arm of the affected side extended behind you at the level of your waist. The wand should be pointing straight  up and down as you hold it. Slowly pull the wand up behind your back by straightening the elbow of your unaffected arm and bending the elbow of your affected arm. External rotation Lie on your back with your affected upper arm supported on a small pillow or rolled towel. When you first do this exercise, keep your upper arm close to your body. Over time, bring your arm up to a 90 angle out to the side. Hold the wand across your stomach and with both hands palm up. Your elbow on your affected side should be bent at a 90 angle. Use your unaffected side to help push your forearm away from you and  toward the floor. Keep your elbow on your affected side bent at a 90 angle. Contact a health care provider if you have: New or increasing pain. New numbness, tingling, weakness, or discoloration in your arm or hand. This information is not intended to replace advice given to you by your health care provider. Make sure you discuss any questions you have with your health care provider. Document Revised: 08/19/2017 Document Reviewed: 08/19/2017 Elsevier Patient Education  2022 [place].   Overactive Bladder, Adult Overactive bladder is a condition in which a person has a sudden and frequent need to urinate. A person might also leak urine if he or she cannot get to the bathroom fast enough (urinary incontinence). Sometimes, symptoms can interfere with work or social activities. What are the causes? Overactive bladder is associated with poor nerve signals between your bladder and your brain. Your bladder may get the signal to empty before it is full. You may also have very sensitive muscles that make your bladder squeeze too soon. This condition may also be caused by other factors, such as: Medical conditions: Urinary tract infection. Infection of nearby tissues. Prostate enlargement. Bladder stones, inflammation, or tumors. Diabetes. Muscle or nerve weakness, especially from these conditions: A spinal cord injury. Stroke. Multiple sclerosis. Parkinson's disease. Other causes: Surgery on the uterus or urethra. Drinking too much caffeine or alcohol. Certain medicines, especially those that eliminate extra fluid in the body (diuretics). Constipation. What increases the risk? You may be at greater risk for overactive bladder if you: Are an older adult. Smoke. Are going through menopause. Have prostate problems. Have a neurological disease, such as stroke, dementia, Parkinson's disease, or multiple sclerosis (MS). Eat or drink alcohol, spicy food, caffeine, and other things that  irritate the bladder. Are overweight or obese. What are the signs or symptoms? Symptoms of this condition include a sudden, strong urge to urinate. Other symptoms include: Leaking urine. Urinating 8 or more times a day. Waking up to urinate 2 or more times overnight. How is this diagnosed? This condition may be diagnosed based on: Your symptoms and medical history. A physical exam. Blood or urine tests to check for possible causes, such as infection. You may also need to see a health care provider who specializes in urinary tract problems. This is called a urologist. How is this treated? Treatment for overactive bladder depends on the cause of your condition and whether it is mild or severe. Treatment may include: Bladder training, such as: Learning to control the urge to urinate by following a schedule to urinate at regular intervals. Doing Kegel exercises to strengthen the pelvic floor muscles that support your bladder. Special devices, such as: Biofeedback. This uses sensors to help you become aware of your body's signals. Electrical stimulation. This uses electrodes placed inside the body (implanted) or outside  the body. These electrodes send gentle pulses of electricity to strengthen the nerves or muscles that control the bladder. Women may use a plastic device, called a pessary, that fits into the vagina and supports the bladder. Medicines, such as: Antibiotics to treat bladder infection. Antispasmodics to stop the bladder from releasing urine at the wrong time. Tricyclic antidepressants to relax bladder muscles. Injections of botulinum toxin type A directly into the bladder tissue to relax bladder muscles. Surgery, such as: A device may be implanted to help manage the nerve signals that control urination. An electrode may be implanted to stimulate electrical signals in the bladder. A procedure may be done to change the shape of the bladder. This is done only in very severe  cases. Follow these instructions at home: Eating and drinking  Make diet or lifestyle changes recommended by your health care provider. These may include: Drinking fluids throughout the day and not only with meals. Cutting down on caffeine or alcohol. Eating a healthy and balanced diet to prevent constipation. This may include: Choosing foods that are high in fiber, such as beans, whole grains, and fresh fruits and vegetables. Limiting foods that are high in fat and processed sugars, such as fried and sweet foods. Lifestyle  Lose weight if needed. Do not use any products that contain nicotine or tobacco. These include cigarettes, chewing tobacco, and vaping devices, such as e-cigarettes. If you need help quitting, ask your health care provider. General instructions Take over-the-counter and prescription medicines only as told by your health care provider. If you were prescribed an antibiotic medicine, take it as told by your health care provider. Do not stop taking the antibiotic even if you start to feel better. Use any implants or pessary as told by your health care provider. If needed, wear pads to absorb urine leakage. Keep a log to track how much and when you drink, and when you need to urinate. This will help your health care provider monitor your condition. Keep all follow-up visits. This is important. Contact a health care provider if: You have a fever or chills. Your symptoms do not get better with treatment. Your pain and discomfort get worse. You have more frequent urges to urinate. Get help right away if: You are not able to control your bladder. Summary Overactive bladder refers to a condition in which a person has a sudden and frequent need to urinate. Several conditions may lead to an overactive bladder. Treatment for overactive bladder depends on the cause and severity of your condition. Making lifestyle changes, doing Kegel exercises, keeping a log, and taking  medicines can help with this condition. This information is not intended to replace advice given to you by your health care provider. Make sure you discuss any questions you have with your health care provider. Document Revised: 03/26/2020 Document Reviewed: 03/26/2020 Elsevier Patient Education  [place].
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
## 2021-09-05 DIAGNOSIS — M1711 Unilateral primary osteoarthritis, right knee: Secondary | ICD-10-CM | POA: Diagnosis not present

## 2021-09-17 DIAGNOSIS — M25561 Pain in right knee: Secondary | ICD-10-CM | POA: Diagnosis not present

## 2021-09-17 DIAGNOSIS — M5412 Radiculopathy, cervical region: Secondary | ICD-10-CM | POA: Diagnosis not present

## 2021-09-17 DIAGNOSIS — M25511 Pain in right shoulder: Secondary | ICD-10-CM | POA: Diagnosis not present

## 2021-09-17 DIAGNOSIS — M48062 Spinal stenosis, lumbar region with neurogenic claudication: Secondary | ICD-10-CM | POA: Diagnosis not present

## 2021-10-01 ENCOUNTER — Other Ambulatory Visit: Payer: Self-pay

## 2021-10-01 ENCOUNTER — Other Ambulatory Visit (INDEPENDENT_AMBULATORY_CARE_PROVIDER_SITE_OTHER): Payer: Medicare Other

## 2021-10-01 DIAGNOSIS — E559 Vitamin D deficiency, unspecified: Secondary | ICD-10-CM

## 2021-10-01 DIAGNOSIS — I10 Essential (primary) hypertension: Secondary | ICD-10-CM | POA: Diagnosis not present

## 2021-10-01 DIAGNOSIS — Z1389 Encounter for screening for other disorder: Secondary | ICD-10-CM

## 2021-10-01 DIAGNOSIS — R7303 Prediabetes: Secondary | ICD-10-CM | POA: Diagnosis not present

## 2021-10-01 DIAGNOSIS — Z125 Encounter for screening for malignant neoplasm of prostate: Secondary | ICD-10-CM

## 2021-10-01 DIAGNOSIS — Z1329 Encounter for screening for other suspected endocrine disorder: Secondary | ICD-10-CM

## 2021-10-01 LAB — COMPREHENSIVE METABOLIC PANEL
ALT: 20 U/L (ref 0–53)
AST: 15 U/L (ref 0–37)
Albumin: 3.9 g/dL (ref 3.5–5.2)
Alkaline Phosphatase: 89 U/L (ref 39–117)
BUN: 23 mg/dL (ref 6–23)
CO2: 29 mEq/L (ref 19–32)
Calcium: 9.1 mg/dL (ref 8.4–10.5)
Chloride: 101 mEq/L (ref 96–112)
Creatinine, Ser: 1.43 mg/dL (ref 0.40–1.50)
GFR: 54.21 mL/min — ABNORMAL LOW (ref >60.00)
Glucose, Bld: 102 mg/dL — ABNORMAL HIGH (ref 70–99)
Potassium: 4.4 mEq/L (ref 3.5–5.1)
Sodium: 137 mEq/L (ref 135–145)
Total Bilirubin: 0.7 mg/dL (ref 0.2–1.2)
Total Protein: 6.1 g/dL (ref 6.0–8.3)

## 2021-10-01 LAB — CBC WITH DIFFERENTIAL/PLATELET
Basophils Absolute: 0 10*3/uL (ref 0.0–0.1)
Basophils Relative: 0.4 % (ref 0.0–3.0)
Eosinophils Absolute: 0.4 10*3/uL (ref 0.0–0.7)
Eosinophils Relative: 4.5 % (ref 0.0–5.0)
HCT: 45.1 % (ref 39.0–52.0)
Hemoglobin: 15 g/dL (ref 13.0–17.0)
Lymphocytes Relative: 18.3 % (ref 12.0–46.0)
Lymphs Abs: 1.7 10*3/uL (ref 0.7–4.0)
MCHC: 33.3 g/dL (ref 30.0–36.0)
MCV: 88.9 fl (ref 78.0–100.0)
Monocytes Absolute: 0.5 10*3/uL (ref 0.1–1.0)
Monocytes Relative: 5.2 % (ref 3.0–12.0)
Neutro Abs: 6.8 10*3/uL (ref 1.4–7.7)
Neutrophils Relative %: 71.6 % (ref 43.0–77.0)
Platelets: 224 10*3/uL (ref 150.0–400.0)
RBC: 5.07 Mil/uL (ref 4.22–5.81)
RDW: 14.8 % (ref 11.5–15.5)
WBC: 9.5 10*3/uL (ref 4.0–10.5)

## 2021-10-01 LAB — LIPID PANEL
Cholesterol: 161 mg/dL (ref 0–200)
HDL: 46.3 mg/dL (ref >39.00)
LDL Cholesterol: 85 mg/dL (ref 0–99)
NonHDL: 114.62
Total CHOL/HDL Ratio: 3
Triglycerides: 147 mg/dL (ref 0.0–149.0)
VLDL: 29.4 mg/dL (ref 0.0–40.0)

## 2021-10-01 LAB — VITAMIN D 25 HYDROXY (VIT D DEFICIENCY, FRACTURES): VITD: 22.54 ng/mL — ABNORMAL LOW (ref 30.00–100.00)

## 2021-10-01 LAB — HEMOGLOBIN A1C: Hgb A1c MFr Bld: 6.2 % (ref 4.6–6.5)

## 2021-10-01 LAB — TSH: TSH: 0.47 u[IU]/mL (ref 0.35–5.50)

## 2021-10-01 LAB — PSA: PSA: 0.41 ng/mL (ref 0.10–4.00)

## 2021-10-02 LAB — URINALYSIS, ROUTINE W REFLEX MICROSCOPIC
Bilirubin Urine: NEGATIVE
Glucose, UA: NEGATIVE
Hgb urine dipstick: NEGATIVE
Ketones, ur: NEGATIVE
Leukocytes,Ua: NEGATIVE
Nitrite: NEGATIVE
Protein, ur: NEGATIVE
Specific Gravity, Urine: 1.015 (ref 1.001–1.035)
pH: 6 (ref 5.0–8.0)

## 2021-10-11 ENCOUNTER — Telehealth: Payer: Self-pay | Admitting: Internal Medicine

## 2021-10-11 NOTE — Telephone Encounter (Signed)
Pt called in stating that he received several mychart about his results... Pt requesting to discuss result with you... Pt requesting callback ?

## 2021-10-14 NOTE — Telephone Encounter (Signed)
Called and spoke with patient concerning labs patient did not understand prediabetes so explained to patient importance of healthy diet and exercise. ?

## 2021-10-16 DIAGNOSIS — M5412 Radiculopathy, cervical region: Secondary | ICD-10-CM | POA: Diagnosis not present

## 2021-10-16 DIAGNOSIS — M48062 Spinal stenosis, lumbar region with neurogenic claudication: Secondary | ICD-10-CM | POA: Diagnosis not present

## 2021-10-16 DIAGNOSIS — M25511 Pain in right shoulder: Secondary | ICD-10-CM | POA: Diagnosis not present

## 2021-10-16 DIAGNOSIS — M25561 Pain in right knee: Secondary | ICD-10-CM | POA: Diagnosis not present

## 2021-11-06 ENCOUNTER — Telehealth: Payer: Self-pay | Admitting: Internal Medicine

## 2021-11-06 NOTE — Telephone Encounter (Signed)
Copied from [place] [phone]. Topic: Medicare AWV ?>> Nov 06, 2021 10:51 AM Harris-Coley, Hannah Beat wrote: ?Reason for CRM: Attempted to schedule AWV. Unable to LVM.  Will try at later time. ?

## 2021-11-08 DIAGNOSIS — G43719 Chronic migraine without aura, intractable, without status migrainosus: Secondary | ICD-10-CM | POA: Diagnosis not present

## 2021-11-08 DIAGNOSIS — I69354 Hemiplegia and hemiparesis following cerebral infarction affecting left non-dominant side: Secondary | ICD-10-CM | POA: Diagnosis not present

## 2021-11-08 DIAGNOSIS — G4733 Obstructive sleep apnea (adult) (pediatric): Secondary | ICD-10-CM | POA: Diagnosis not present

## 2021-11-08 DIAGNOSIS — E538 Deficiency of other specified B group vitamins: Secondary | ICD-10-CM | POA: Diagnosis not present

## 2021-11-12 ENCOUNTER — Other Ambulatory Visit: Payer: Self-pay | Admitting: Internal Medicine

## 2021-11-12 DIAGNOSIS — E785 Hyperlipidemia, unspecified: Secondary | ICD-10-CM

## 2021-11-12 DIAGNOSIS — Z8673 Personal history of transient ischemic attack (TIA), and cerebral infarction without residual deficits: Secondary | ICD-10-CM

## 2021-11-13 DIAGNOSIS — M48062 Spinal stenosis, lumbar region with neurogenic claudication: Secondary | ICD-10-CM | POA: Diagnosis not present

## 2021-11-13 DIAGNOSIS — M25561 Pain in right knee: Secondary | ICD-10-CM | POA: Diagnosis not present

## 2021-11-13 DIAGNOSIS — M5412 Radiculopathy, cervical region: Secondary | ICD-10-CM | POA: Diagnosis not present

## 2021-11-13 DIAGNOSIS — M25511 Pain in right shoulder: Secondary | ICD-10-CM | POA: Diagnosis not present

## 2021-11-19 ENCOUNTER — Ambulatory Visit (INDEPENDENT_AMBULATORY_CARE_PROVIDER_SITE_OTHER): Payer: Medicare Other | Admitting: Internal Medicine

## 2021-11-19 ENCOUNTER — Encounter: Payer: Self-pay | Admitting: Internal Medicine

## 2021-11-19 VITALS — BP 134/84 | HR 103 | Temp 97.6°F | Resp 14 | Ht 68.0 in | Wt 300.4 lb

## 2021-11-19 DIAGNOSIS — Z6841 Body Mass Index (BMI) 40.0 and over, adult: Secondary | ICD-10-CM | POA: Diagnosis not present

## 2021-11-19 DIAGNOSIS — Z8673 Personal history of transient ischemic attack (TIA), and cerebral infarction without residual deficits: Secondary | ICD-10-CM

## 2021-11-19 DIAGNOSIS — R7303 Prediabetes: Secondary | ICD-10-CM | POA: Diagnosis not present

## 2021-11-19 DIAGNOSIS — I10 Essential (primary) hypertension: Secondary | ICD-10-CM | POA: Diagnosis not present

## 2021-11-19 DIAGNOSIS — G4733 Obstructive sleep apnea (adult) (pediatric): Secondary | ICD-10-CM

## 2021-11-19 DIAGNOSIS — E785 Hyperlipidemia, unspecified: Secondary | ICD-10-CM

## 2021-11-19 DIAGNOSIS — Z1283 Encounter for screening for malignant neoplasm of skin: Secondary | ICD-10-CM

## 2021-11-19 DIAGNOSIS — Z9989 Dependence on other enabling machines and devices: Secondary | ICD-10-CM

## 2021-11-19 MED ORDER — WEGOVY 2.4 MG/0.75ML ~~LOC~~ SOAJ: 2.4000 mg | SUBCUTANEOUS | 5 refills | Status: DC

## 2021-11-19 MED ORDER — WEGOVY 0.25 MG/0.5ML ~~LOC~~ SOAJ: 0.2500 mg | SUBCUTANEOUS | 0 refills | Status: DC

## 2021-11-19 MED ORDER — ATORVASTATIN CALCIUM 40 MG PO TABS: ORAL_TABLET | ORAL | 3 refills | Status: DC

## 2021-11-19 MED ORDER — WEGOVY 0.5 MG/0.5ML ~~LOC~~ SOAJ: 0.5000 mg | SUBCUTANEOUS | 0 refills | Status: DC

## 2021-11-19 MED ORDER — CARVEDILOL 25 MG PO TABS: 25.0000 mg | ORAL_TABLET | Freq: Two times a day (BID) | ORAL | 3 refills | Status: DC

## 2021-11-19 MED ORDER — AMLODIPINE BESYLATE 5 MG PO TABS: ORAL_TABLET | ORAL | 3 refills | Status: DC

## 2021-11-19 MED ORDER — LISINOPRIL 20 MG PO TABS: 20.0000 mg | ORAL_TABLET | Freq: Every day | ORAL | 3 refills | Status: DC

## 2021-11-19 MED ORDER — WEGOVY 1.7 MG/0.75ML ~~LOC~~ SOAJ: 1.7000 mg | SUBCUTANEOUS | 0 refills | Status: DC

## 2021-11-19 MED ORDER — WEGOVY 1 MG/0.5ML ~~LOC~~ SOAJ: 1.0000 mg | SUBCUTANEOUS | 0 refills | Status: DC

## 2021-11-19 NOTE — Patient Instructions (Addendum)
Miralax 1-2 x per day in 8 ounces of water + colace 1-2 x per day  ?Call and reschedule colonoscopy ? [facility]   ?[place]   ?[place], [place] [address]   ?[place], [place]   ?[place]   ?[place], [place] [address]   ?[phone]   ?[phone] (Fax)   Questions about Procedure/Medication  ?Nasal saline + claritin/allegra/zyrtec or xyal as needed  ? ?Eustachian Tube Dysfunction ? ?Eustachian tube dysfunction refers to a condition in which a blockage develops in the narrow passage that connects the middle ear to the back of the nose (eustachian tube). The eustachian tube regulates air pressure in the middle ear by letting air move between the ear and nose. It also helps to drain fluid from the middle ear space. ?Eustachian tube dysfunction can affect one or both ears. When the eustachian tube does not function properly, air pressure, fluid, or both can build up in the middle ear. ?What are the causes? ?This condition occurs when the eustachian tube becomes blocked or cannot open normally. Common causes of this condition include: ?Ear infections. ?Colds and other infections that affect the nose, mouth, and throat (upper respiratory tract). ?Allergies. ?Irritation from cigarette smoke. ?Irritation from stomach acid coming up into the esophagus (gastroesophageal reflux). The esophagus is the part of the body that moves food from the mouth to the stomach. ?Sudden changes in air pressure, such as from descending in an airplane or scuba diving. ?Abnormal growths in the nose or throat, such as: ?Growths that line the nose (nasal polyps). ?Abnormal growth of cells (tumors). ?Enlarged tissue at the back of the throat (adenoids). ?What increases the risk? ?You are more likely to develop this condition if: ?You smoke. ?You are overweight. ?You are a child who has: ?Certain birth defects of the mouth, such as cleft palate. ?Large tonsils or adenoids. ?What are the signs or  symptoms? ?Common symptoms of this condition include: ?A feeling of fullness in the ear. ?Ear pain. ?Clicking or popping noises in the ear. ?Ringing in the ear (tinnitus). ?Hearing loss. ?Loss of balance. ?Dizziness. ?Symptoms may get worse when the air pressure around you changes, such as when you travel to an area of high elevation, fly on an airplane, or go scuba diving. ?How is this diagnosed? ?This condition may be diagnosed based on: ?Your symptoms. ?A physical exam of your ears, nose, and throat. ?Tests, such as those that measure: ?The movement of your eardrum. ?Your hearing (audiometry). ?How is this treated? ?Treatment depends on the cause and severity of your condition. ?In mild cases, you may relieve your symptoms by moving air into your ears. This is called "popping the ears." ?In more severe cases, or if you have symptoms of fluid in your ears, treatment may include: ?Medicines to relieve congestion (decongestants). ?Medicines that treat allergies (antihistamines). ?Nasal sprays or ear drops that contain medicines that reduce swelling (steroids). ?A procedure to drain the fluid in your eardrum. In this procedure, a small tube may be placed in the eardrum to: ?Drain the fluid. ?Restore the air in the middle ear space. ?A procedure to insert a balloon device through the nose to inflate the opening of the eustachian tube (balloon dilation). ?Follow these instructions at home: ?Lifestyle ?Do not do any of the following until your health care provider approves: ?Travel to high altitudes. ?Fly in airplanes. ?Work in a Pension scheme manager or room. ?Scuba dive. ?Do not use any products that contain nicotine  or tobacco. These products include cigarettes, chewing tobacco, and vaping devices, such as e-cigarettes. If you need help quitting, ask your health care provider. ?Keep your ears dry. Wear fitted earplugs during showering and bathing. Dry your ears completely after. ?General instructions ?Take  over-the-counter and prescription medicines only as told by your health care provider. ?Use techniques to help pop your ears as recommended by your health care provider. These may include: ?Chewing gum. ?Yawning. ?Frequent, forceful swallowing. ?Closing your mouth, holding your nose closed, and gently blowing as if you are trying to blow air out of your nose. ?Keep all follow-up visits. This is important. ?Contact a health care provider if: ?Your symptoms do not go away after treatment. ?Your symptoms come back after treatment. ?You are unable to pop your ears. ?You have: ?A fever. ?Pain in your ear. ?Pain in your head or neck. ?Fluid draining from your ear. ?Your hearing suddenly changes. ?You become very dizzy. ?You lose your balance. ?Get help right away if: ?You have a sudden, severe increase in any of your symptoms. ?Summary ?Eustachian tube dysfunction refers to a condition in which a blockage develops in the eustachian tube. ?It can be caused by ear infections, allergies, inhaled irritants, or abnormal growths in the nose or throat. ?Symptoms may include ear pain or fullness, hearing loss, or ringing in the ears. ?Mild cases are treated with techniques to unblock the ears, such as yawning or chewing gum. ?More severe cases are treated with medicines or procedures. ?This information is not intended to replace advice given to you by your health care provider. Make sure you discuss any questions you have with your health care provider. ?Document Revised: 09/17/2020 Document Reviewed: 09/17/2020 ?Elsevier Patient Education ? [place]. ? ?
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
## 2021-11-25 ENCOUNTER — Telehealth: Payer: Self-pay

## 2021-11-25 NOTE — Telephone Encounter (Signed)
Prior authorization has been started for pt's Semaglutide-Weight Management (WEGOVY) 0.25 MG/0.5ML SOAJ via covermymeds on 11/25/21 ? ?Key: [id number] ? ?Awaiting Denial or Approval. ?

## 2021-11-29 DIAGNOSIS — Z8719 Personal history of other diseases of the digestive system: Secondary | ICD-10-CM | POA: Diagnosis not present

## 2021-11-29 DIAGNOSIS — K224 Dyskinesia of esophagus: Secondary | ICD-10-CM | POA: Diagnosis not present

## 2021-11-29 DIAGNOSIS — K5903 Drug induced constipation: Secondary | ICD-10-CM | POA: Diagnosis not present

## 2021-11-29 DIAGNOSIS — Z1211 Encounter for screening for malignant neoplasm of colon: Secondary | ICD-10-CM | POA: Diagnosis not present

## 2021-12-02 ENCOUNTER — Telehealth: Payer: Self-pay

## 2021-12-02 NOTE — Telephone Encounter (Signed)
Lvm for pt to return call to disc further steps.  ?

## 2021-12-02 NOTE — Telephone Encounter (Signed)
Pt can call insurance and file an appeal for wegovy  ?And yes f/u with ortho  ?Or if wants referral to wt loss clinic in [place] they maybe able to help as well  ?Does he want referral?  ? ?

## 2021-12-02 NOTE — Telephone Encounter (Signed)
Patient states he missed our call.  I read the note from [facility], [place], to patient.  Patient states that he is planning to file an appeal with his insurance company first and if that doesn't work, then he will plan to go to the weight loss clinic.  I gave patient our fax number.  Patient states he has an appointment this Friday (12/06/2021), for ortho at [facility]. ?

## 2021-12-02 NOTE — Telephone Encounter (Signed)
Lvm for pt to return call in regards to mychart message that was received on 11/29/21 ? ?Per Dr.Tracy: ?Pt can call insurance and file an appeal for wegovy and have them fax paperwork to fill ?And yes f/u with ortho  ?Or if wants referral to wt loss clinic in [place] they maybe able to help as well  ?Does he want referral?  ?  ?

## 2021-12-03 NOTE — Telephone Encounter (Signed)
[facility] to [facility] and my chart message to inform pt  ? ?

## 2021-12-03 NOTE — Addendum Note (Signed)
Addended by: Orland Mustard on: 12/03/2021 04:44 PM ? ? Modules accepted: Orders ? ?

## 2021-12-06 DIAGNOSIS — M222X1 Patellofemoral disorders, right knee: Secondary | ICD-10-CM | POA: Diagnosis not present

## 2021-12-06 DIAGNOSIS — I10 Essential (primary) hypertension: Secondary | ICD-10-CM | POA: Diagnosis not present

## 2021-12-06 DIAGNOSIS — M1711 Unilateral primary osteoarthritis, right knee: Secondary | ICD-10-CM | POA: Diagnosis not present

## 2021-12-11 DIAGNOSIS — M5412 Radiculopathy, cervical region: Secondary | ICD-10-CM | POA: Diagnosis not present

## 2021-12-11 DIAGNOSIS — M4726 Other spondylosis with radiculopathy, lumbar region: Secondary | ICD-10-CM | POA: Diagnosis not present

## 2021-12-11 DIAGNOSIS — M48062 Spinal stenosis, lumbar region with neurogenic claudication: Secondary | ICD-10-CM | POA: Diagnosis not present

## 2021-12-11 DIAGNOSIS — M25561 Pain in right knee: Secondary | ICD-10-CM | POA: Diagnosis not present

## 2021-12-17 ENCOUNTER — Ambulatory Visit: Payer: Medicare Other | Admitting: Physical Therapy

## 2021-12-24 ENCOUNTER — Telehealth: Payer: Self-pay | Admitting: Physical Therapy

## 2021-12-24 NOTE — Telephone Encounter (Signed)
Attempted to call pt to offer earlier slot. Did not reach and could not leave VM because mailbox full.

## 2021-12-25 ENCOUNTER — Ambulatory Visit: Payer: Medicare Other | Attending: Orthopedic Surgery | Admitting: Physical Therapy

## 2021-12-25 ENCOUNTER — Encounter: Payer: Self-pay | Admitting: Physical Therapy

## 2021-12-25 ENCOUNTER — Other Ambulatory Visit: Payer: Self-pay

## 2021-12-25 DIAGNOSIS — R262 Difficulty in walking, not elsewhere classified: Secondary | ICD-10-CM | POA: Diagnosis not present

## 2021-12-25 DIAGNOSIS — M25561 Pain in right knee: Secondary | ICD-10-CM | POA: Diagnosis not present
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
## 2021-12-31 ENCOUNTER — Ambulatory Visit: Payer: Medicare Other | Admitting: Physical Therapy

## 2021-12-31 ENCOUNTER — Encounter: Payer: Self-pay | Admitting: Physical Therapy

## 2021-12-31 ENCOUNTER — Telehealth: Payer: Self-pay | Admitting: Physical Therapy

## 2021-12-31 NOTE — Therapy (Unsigned)
  OUTPATIENT PHYSICAL THERAPY SCREEN   Patient Name: Jake Brown MRN: [id number] DOB:1965-12-31, 56 y.o., male Today's Date: 01/01/2022  PCP: Dr. Terese Door  REFERRING PROVIDER:  Dorise Hiss PA-C    REFERRING DIAG: Acute Right Knee Pain   THERAPY DIAG:  Acute pain of right knee  Difficulty in walking, not elsewhere classified  Rationale for Evaluation and Treatment Rehabilitation   PRECAUTIONS: None  SUBJECTIVE: Patient reports taking BP medications 20 minutes before appointment. He states he does not have any hypertensive symptoms.   PAIN:  Are you having pain? Yes: NPRS scale: 4/10 Pain location: Lateral side of right knee  Pain description: Achy Aggravating factors: Weight bearing activity  Relieving factors: Non-weight bearing    OBJECTIVE:   VITALS: BP 180/124                BP 154/113                BP 160/115  ASSESSMENT: Held exercise and instructed pt to reach out PCP about BP. Contact PCP via [facility] who advised patient take an additional dose of his BP medication and to reach out to her office if his BP exceeds 130/80. He also should record BPs   Bradly Chris PT, DPT  01/01/2022, 10:12 AM

## 2021-12-31 NOTE — Telephone Encounter (Signed)
Attempted to call pt to inform him to call PCP if his BP was above 130/80 to schedule apt. Pt went to physical therapy appointment today and his BP hypertensive to point where exercise was held with readings of:  180/120 150/115 140/113  He was not experiencing symptoms and he was instructed to reach out to PCP to discuss hypertension. He informed PT that he was going to take more of his BP when he reached home. PT contacted his PCP about episode and instructed PT to communicate to pt to take more BP medication and to record BP. If he has BP >130/80, then he should call his PCP's office. This was communicated to pt.

## 2022-01-02 ENCOUNTER — Ambulatory Visit: Payer: Medicare Other | Admitting: Physical Therapy

## 2022-01-06 ENCOUNTER — Ambulatory Visit: Payer: Medicare Other | Admitting: Physical Therapy

## 2022-01-07 ENCOUNTER — Encounter: Payer: Medicare Other | Admitting: Physical Therapy

## 2022-01-09 ENCOUNTER — Encounter: Payer: Medicare Other | Admitting: Physical Therapy

## 2022-01-09 ENCOUNTER — Ambulatory Visit: Payer: Medicare Other | Admitting: Physical Therapy

## 2022-01-09 DIAGNOSIS — M48062 Spinal stenosis, lumbar region with neurogenic claudication: Secondary | ICD-10-CM | POA: Diagnosis not present

## 2022-01-09 DIAGNOSIS — M25561 Pain in right knee: Secondary | ICD-10-CM | POA: Diagnosis not present

## 2022-01-09 DIAGNOSIS — M5412 Radiculopathy, cervical region: Secondary | ICD-10-CM | POA: Diagnosis not present

## 2022-01-09 DIAGNOSIS — Z79891 Long term (current) use of opiate analgesic: Secondary | ICD-10-CM | POA: Diagnosis not present

## 2022-01-09 DIAGNOSIS — M4726 Other spondylosis with radiculopathy, lumbar region: Secondary | ICD-10-CM | POA: Diagnosis not present

## 2022-01-09 DIAGNOSIS — G894 Chronic pain syndrome: Secondary | ICD-10-CM | POA: Diagnosis not present

## 2022-01-14 ENCOUNTER — Encounter: Payer: Medicare Other | Admitting: Physical Therapy

## 2022-01-15 ENCOUNTER — Telehealth: Payer: Self-pay

## 2022-01-15 ENCOUNTER — Encounter: Payer: Self-pay | Admitting: Internal Medicine

## 2022-01-15 ENCOUNTER — Ambulatory Visit (INDEPENDENT_AMBULATORY_CARE_PROVIDER_SITE_OTHER): Payer: Medicare Other | Admitting: Internal Medicine

## 2022-01-15 VITALS — BP 180/130 | HR 108 | Temp 98.0°F | Resp 14 | Ht 68.0 in | Wt 303.4 lb

## 2022-01-15 DIAGNOSIS — G8929 Other chronic pain: Secondary | ICD-10-CM

## 2022-01-15 DIAGNOSIS — F32A Depression, unspecified: Secondary | ICD-10-CM

## 2022-01-15 DIAGNOSIS — M5126 Other intervertebral disc displacement, lumbar region: Secondary | ICD-10-CM | POA: Diagnosis not present

## 2022-01-15 DIAGNOSIS — M51369 Other intervertebral disc degeneration, lumbar region without mention of lumbar back pain or lower extremity pain: Secondary | ICD-10-CM

## 2022-01-15 DIAGNOSIS — I10 Essential (primary) hypertension: Secondary | ICD-10-CM

## 2022-01-15 DIAGNOSIS — M5136 Other intervertebral disc degeneration, lumbar region: Secondary | ICD-10-CM

## 2022-01-15 DIAGNOSIS — M48061 Spinal stenosis, lumbar region without neurogenic claudication: Secondary | ICD-10-CM

## 2022-01-15 DIAGNOSIS — F419 Anxiety disorder, unspecified: Secondary | ICD-10-CM | POA: Diagnosis not present

## 2022-01-15 DIAGNOSIS — Z6841 Body Mass Index (BMI) 40.0 and over, adult: Secondary | ICD-10-CM | POA: Diagnosis not present

## 2022-01-15 DIAGNOSIS — M545 Low back pain, unspecified: Secondary | ICD-10-CM

## 2022-01-15 DIAGNOSIS — R937 Abnormal findings on diagnostic imaging of other parts of musculoskeletal system: Secondary | ICD-10-CM

## 2022-01-15 MED ORDER — CARVEDILOL 25 MG PO TABS: 25.0000 mg | ORAL_TABLET | Freq: Two times a day (BID) | ORAL | 3 refills | Status: DC

## 2022-01-15 MED ORDER — CARVEDILOL PHOSPHATE ER 80 MG PO CP24: 80.0000 mg | ORAL_CAPSULE | Freq: Every day | ORAL | 3 refills | Status: DC

## 2022-01-15 MED ORDER — AMLODIPINE BESYLATE 10 MG PO TABS: 10.0000 mg | ORAL_TABLET | Freq: Every day | ORAL | 3 refills | Status: DC

## 2022-01-15 MED ORDER — SERTRALINE HCL 100 MG PO TABS: 100.0000 mg | ORAL_TABLET | Freq: Every day | ORAL | 3 refills | Status: DC

## 2022-01-15 MED ORDER — LISINOPRIL 20 MG PO TABS: 20.0000 mg | ORAL_TABLET | Freq: Every day | ORAL | 3 refills | Status: DC

## 2022-01-15 MED ORDER — BLOOD PRESSURE KIT: 1.0000 | PACK | Freq: Every day | 0 refills | Status: DC

## 2022-01-15 NOTE — Telephone Encounter (Signed)
PA has been started via covermymeds on 01/15/22 for pt's Carvedilol Phosphate ER '80MG'$  er capsules  [id number] Rx #:[id number]  Awaiting denial or approval

## 2022-01-15 NOTE — Patient Instructions (Addendum)
[facility] service in [place], [place] Address: [address] [place], [place], [place] [address] Hours:  Open ? Closes 6?PM Phone: [phone]  Ophelia Charter, MD 4.9 134 [employer] reviews Neurosurgeon in [place], [place] Get online care: [email or web address] Address: [address], [place], [place] [address] Hours:  Open ? Closes 5?PM Phone: [phone]   [facility] as given the info before for both  [facility] counseling and psychiat[facility] [place]  [place] [phone]    [name] [place]  [address] [place] [address]  [phone]
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
## 2022-01-16 NOTE — Telephone Encounter (Signed)
Jake Brown from [facility] called stating carvedilol is not covered under patient insurance but they have several alternatives to choose from [phone] option 5

## 2022-01-17 ENCOUNTER — Other Ambulatory Visit: Payer: Self-pay | Admitting: Internal Medicine

## 2022-01-17 DIAGNOSIS — M5136 Other intervertebral disc degeneration, lumbar region: Secondary | ICD-10-CM | POA: Insufficient documentation

## 2022-01-17 DIAGNOSIS — G8929 Other chronic pain: Secondary | ICD-10-CM | POA: Insufficient documentation

## 2022-01-17 DIAGNOSIS — I10 Essential (primary) hypertension: Secondary | ICD-10-CM

## 2022-01-17 DIAGNOSIS — M48061 Spinal stenosis, lumbar region without neurogenic claudication: Secondary | ICD-10-CM | POA: Insufficient documentation

## 2022-01-17 MED ORDER — NEBIVOLOL HCL 20 MG PO TABS: 1.0000 | ORAL_TABLET | Freq: Every day | ORAL | 0 refills | Status: DC

## 2022-01-17 NOTE — Telephone Encounter (Signed)
Inform pt sent bystolic 20 mg monitor BP and let me know make sure pt has BP visit scheduled 1-2 weeks rn visit

## 2022-01-20 ENCOUNTER — Ambulatory Visit: Payer: Medicare Other | Admitting: Physical Therapy

## 2022-01-22 ENCOUNTER — Ambulatory Visit: Payer: Medicare Other | Admitting: Physical Therapy

## 2022-01-27 ENCOUNTER — Encounter: Payer: Medicare Other | Admitting: Physical Therapy

## 2022-01-29 ENCOUNTER — Ambulatory Visit: Payer: Medicare Other

## 2022-01-29 DIAGNOSIS — I10 Essential (primary) hypertension: Secondary | ICD-10-CM

## 2022-01-29 NOTE — Progress Notes (Signed)
Patient is here for a BP check due to bp being high at last visit, as per patient.  Currently patients BP is 150/90 and BPM is 59 in the left arm and 146/94 BPM is 60 in the right arm. Patient has no complaints of headaches, blurry vision, chest pain, arm pain, light headedness, dizziness, and nor jaw pain. Please see previous note for order. Let pt sit for 10 minutes and BP is now 126/88 BPM is 58 in left arm and 128/88 BPM is 57 in right arm. Pt stated that he is compliant with his BP medications.

## 2022-01-30 ENCOUNTER — Encounter: Payer: Medicare Other | Admitting: Physical Therapy

## 2022-02-03 ENCOUNTER — Ambulatory Visit: Payer: Medicare Other | Admitting: Physical Therapy

## 2022-02-04 DIAGNOSIS — M79674 Pain in right toe(s): Secondary | ICD-10-CM | POA: Diagnosis not present

## 2022-02-04 DIAGNOSIS — L600 Ingrowing nail: Secondary | ICD-10-CM | POA: Diagnosis not present

## 2022-02-04 DIAGNOSIS — M79675 Pain in left toe(s): Secondary | ICD-10-CM | POA: Diagnosis not present

## 2022-02-04 DIAGNOSIS — B351 Tinea unguium: Secondary | ICD-10-CM | POA: Diagnosis not present

## 2022-02-05 ENCOUNTER — Encounter: Payer: Medicare Other | Admitting: Physical Therapy

## 2022-02-07 ENCOUNTER — Telehealth: Payer: Self-pay | Admitting: Internal Medicine

## 2022-02-07 ENCOUNTER — Other Ambulatory Visit: Payer: Self-pay | Admitting: Internal Medicine

## 2022-02-07 DIAGNOSIS — I10 Essential (primary) hypertension: Secondary | ICD-10-CM

## 2022-02-07 MED ORDER — LISINOPRIL 30 MG PO TABS: 30.0000 mg | ORAL_TABLET | Freq: Every day | ORAL | 3 refills | Status: DC

## 2022-02-07 NOTE — Telephone Encounter (Signed)
Increase lis 20  (1.5 tablets ) to 30 mg daily (sent 1 tablet) With norvasc 10 and bystolic 20 mg qd  Dr. Olivia Mackie McLean-Scocuzza

## 2022-02-11 ENCOUNTER — Ambulatory Visit: Payer: Medicare Other | Admitting: Physical Therapy

## 2022-02-13 ENCOUNTER — Encounter: Payer: Self-pay | Admitting: Internal Medicine

## 2022-02-13 ENCOUNTER — Ambulatory Visit (INDEPENDENT_AMBULATORY_CARE_PROVIDER_SITE_OTHER): Payer: Medicare Other | Admitting: Internal Medicine

## 2022-02-13 ENCOUNTER — Encounter: Payer: Medicare Other | Admitting: Physical Therapy

## 2022-02-13 VITALS — BP 144/70 | HR 69 | Temp 97.7°F | Ht 68.0 in | Wt 303.6 lb

## 2022-02-13 DIAGNOSIS — Z01818 Encounter for other preprocedural examination: Secondary | ICD-10-CM | POA: Diagnosis not present

## 2022-02-13 DIAGNOSIS — G894 Chronic pain syndrome: Secondary | ICD-10-CM | POA: Diagnosis not present

## 2022-02-13 DIAGNOSIS — I10 Essential (primary) hypertension: Secondary | ICD-10-CM

## 2022-02-13 MED ORDER — BLOOD PRESSURE KIT: 1.0000 | PACK | Freq: Every day | 0 refills | Status: DC
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
## 2022-02-13 NOTE — Patient Instructions (Addendum)
Goal blood <130/<80  If you are not at goal + please check then I think changing to telmisartan or olmesartan for blood pressure  The week of 03/10/22 let me know what your blood pressure doing  New doctor Dr. Volanda Napoleon to establish with her  Take your medications on time and as prescribed   [facility]   [place], [place] [address]   [place], [place], [facility]   [place], [place] [address]   [phone] (Work)   [phone] (Fax)    Dr. Nehemiah Massed is another alternative    If you have any questions or concerns, please call the office or send a MyChart message. We will try our best to get back to you as soon as possible.   Office #: [phone]  COLONOSCOPY Preparation Instructions PREP: Myrna Blazer   Please place these instructions in place you can easily locate. We encourage you to review them a week prior to your procedure.   1. Your procedure is scheduled for Wednesday AUGUST 03/05/2022 Please contact our office if you need to cancel or reschedule your procedure at least 24 hours in advance to avoid a $75.00 cancellation fee. Three cancellations/reschedules will result in another office visit with a provider prior to having your procedure scheduled.  2. Your procedure is scheduled with  '[x]'$  Dr. Alice Reichert  3. Your procedure will be performed at: '[x]'$  [facility]  4. To get your scheduled arrival time: '[x]'$  [facility]- Please call the endo unit at [phone] between 1-3pm on Tuesday AUGUST 03/04/2022  **YOU WILL NEED TO PURCHASE TRILYTE FOR YOUR COLONOSCOPY PREP. A PRESCRIPTION HAS BEEN SENT TO YOUR PHARMACY ELECTRONICALLY.**  7 Days Prior to your procedure   *Stop your iron supplements - iron leaves a red residue in the colon and can appear as blood in your colon.  5 Days Prior to your procedure  No nuts, seeds, corn or popcorn.   If you are on a blood thinner we will contact your cardiologist for  permission to hold this medication. Our office will contact you once this has been received and will notify you of how many days to hold this medication.   The DAY BEFORE your procedure Tuesday AUGUST 03/04/2022  In the morning, prepare by filling the jug with lukewarm water to the "fill line." This line is located at the top of the bottle. Shake well and place jug in the refrigerator to chill. *You may add 2 packets of Crystal Light Lemonade to assist with flavor. DO NOT FOLLOW INSTRUCTIONS LISTED ON THE JUG.  2. If you are diabetic, please DO NOT take your diabetic pills.  Insulin Instructions:  '[]'$  Take your full prescribed dose of morning insulin (LONG ACTING). '[x]'$  Take 1/2 dose of your evening insulin (LONG ACTING). Monitor blood sugar closely. Call nurse with any questions.   3. Drink clear liquids ALL DAY long (NON-ALCOHOLIC). NO FOOD. NOTHING RED OR PURPLE. Clear liquids consist of anything you can hold up to light and see through.   SOFT DRINKS- Ginger ale, cola, sprite, 7-up, Gatorade, Kool-Aid (not red or purple) STRAINED FRUIT JUICES WITHOUT PULP- Apple, white grape, lemonade. WATER, TEA, COFFEE (no milk or non-dairy creamer) CHICKEN OR BEEF BROTH OR BOUILLAN JELLO (lemon, lime, orange: NO FRUITS OR TOPPINGS) HARD CANDIES POPSICLES  NOTHING RED OR PURPLE. THERE IS NO LIMIT ON THE AMOUNT OF CLEAR LIQUIDS YOU CAN HAVE AND THE MORE YOU DRINK THE  BETTER YOU WILL FEEL AND THE BETTER YOUR CLEAN OUT WILL BE. Try to drink at least 1 glass every hour of clear fluids over the course of the day until bedtime.   *IF you are a diabetic please alternate between regular sugar and sugar free clear liquids*  4. Start drinking your prep between 3pm and 6 pm. Drink an 8 oz glass every 20-30 minutes until ALL of the solution is gone. You may use a straw to drink your prep. If you have any bloating, walking will help.  *If you should become nauseated, stop for 45-60 minutes to let your stomach  settle and then resume an 8 oz glass every 45 minutes.  *NO SMOKING AFTER MIDNIGHT  The Day of your procedure: Wednesday AUGUST 03/05/2022  PLEASE WEAR A MASK OR FACE COVERING   '[x]'$  If you are scheduled for a morning procedure, nothing to drink after midnight.  '[x]'$  If you are scheduled for an afternoon procedure, you may continue to have clear liquids until 5 hours before your procedure. NO CARBONATED DRINKS OR BROTH.  *REMINDER: DO NOT EAT ANY FOOD  * If you are diabetic, please DO NOT take your diabetic pills. Insulin Instructions: DO NOT TAKE. MONITOR YOUR BLOOD SUGARS. Call nurse with any questions.  You may take the following medication by 6 a.m. with just enough water to swallow your pills: Heart medication Seizure medication Blood pressure medication Parkinson's medication Breathing medication * Do not take Multivitamins, Fish Oil, Ibuprofen or NSAIDS the day of your procedure.   Dr. Alice Reichert will discuss your initial findings with you immediately post-procedure. After your procedure, you will receive a results letter in the mail with Dr. Ricky Stabs final recommendations. However, you can always call our office to check on the status of your pathology results.   PROCEDURE LOCATION INFORMATION  '[x]'$  [facility]-You must check in at the [facility] mall 1st floor Registration desk at your designated time.  *If you are a male of child-bearing age, do NOT empty your bladder prior to arrival.  Notify the nurse/doctor if you have any changes in your medical condition since your office visit.  You will receive medication to make you sleepy for the test. You must have someone drive you home and be responsible for your care. You cannot drive, work, or operate machinery for 24 hours after the test.  After the procedure, the nurse will go over your discharge instructions.  If you have any questions about the procedure or prep please call [phone].  *IF YOU USE A PORTABLE CPAP MACHINE PLEASE  BRING THIS WITH YOU TO YOUR PROCEDURE  Additional Information:  We recommend that you verify your insurance benefits and expected out of pocket costs prior to your procedure. We also recommend that you verify providers including anesthesiologist and the chosen facility are in [place]. While our office will perform a prior authorization we will not know the cost of your procedure.  You MUST have someone drive you Home from your procedure. You must have a responsible adult with a valid Driver's License who is on site throughout your entire procedure and who can stay with you for several hours after your procedure. This applies for [place]. Without verification that you have someone to do this, they will not proceed with your procedure. You may not go home alone in a taxi, shuttle [place], [place] or bus, as the drivers will not be responsible for you.   Please notify our office if your medical status changes  BEFORE your procedure date. This includes new diagnoses, surgeries, medications, and hospital visits/admissions. Failure to do this can result in your procedure being cancelled.   To ensure a successful exam, please follow all instructions carefully. Failure to accurately and completely prepare for your exam may result in the need for an additional procedures will be billed to your insurance.

## 2022-02-18 ENCOUNTER — Ambulatory Visit: Payer: Medicare Other | Admitting: Physical Therapy

## 2022-02-18 ENCOUNTER — Telehealth: Payer: Self-pay | Admitting: Physical Therapy

## 2022-02-18 DIAGNOSIS — I10 Essential (primary) hypertension: Secondary | ICD-10-CM | POA: Diagnosis not present

## 2022-02-18 DIAGNOSIS — I341 Nonrheumatic mitral (valve) prolapse: Secondary | ICD-10-CM | POA: Diagnosis not present

## 2022-02-18 DIAGNOSIS — G4733 Obstructive sleep apnea (adult) (pediatric): Secondary | ICD-10-CM | POA: Diagnosis not present

## 2022-02-18 NOTE — Telephone Encounter (Signed)
Attempted to call pt to inquire about absence from apt. He did not respond and VM full so I could not leave message.

## 2022-02-19 ENCOUNTER — Telehealth: Payer: Self-pay | Admitting: Physical Therapy

## 2022-02-19 NOTE — Telephone Encounter (Signed)
Attempted to call pt to determine whether he would be able to attend upcoming apt on account of missed appointments. Pt did not respond and VM so could not leave message.

## 2022-02-20 ENCOUNTER — Telehealth: Payer: Self-pay | Admitting: Physical Therapy

## 2022-02-20 ENCOUNTER — Ambulatory Visit: Payer: Medicare Other | Attending: Orthopedic Surgery | Admitting: Physical Therapy

## 2022-02-20 NOTE — Telephone Encounter (Signed)
Called pt to inquiry about absence. Pt reports having ongoing elevated BP. He has just seen cardiologist and he is starting new medications. Once he feels that his BP is under control, then he will call back to be added back to schedule.

## 2022-02-23 ENCOUNTER — Other Ambulatory Visit: Payer: Self-pay | Admitting: Internal Medicine

## 2022-02-23 DIAGNOSIS — I10 Essential (primary) hypertension: Secondary | ICD-10-CM

## 2022-02-25 ENCOUNTER — Ambulatory Visit: Payer: Medicare Other | Admitting: Physical Therapy

## 2022-02-27 ENCOUNTER — Ambulatory Visit: Payer: Medicare Other | Admitting: Physical Therapy

## 2022-02-27 ENCOUNTER — Encounter: Payer: Self-pay | Admitting: Internal Medicine

## 2022-03-04 ENCOUNTER — Encounter: Payer: Medicare Other | Admitting: Physical Therapy

## 2022-03-05 ENCOUNTER — Ambulatory Visit
Admission: RE | Admit: 2022-03-05 | Discharge: 2022-03-05 | Disposition: A | Discharge status: Home / Self Care | Payer: Medicare Other | Attending: Internal Medicine | Admitting: Internal Medicine

## 2022-03-05 ENCOUNTER — Encounter
Admission: RE | Disposition: A | Discharge status: Home / Self Care | Payer: Self-pay | Source: Home / Self Care | Attending: Internal Medicine

## 2022-03-05 ENCOUNTER — Ambulatory Visit: Payer: Medicare Other | Admitting: Anesthesiology

## 2022-03-05 ENCOUNTER — Encounter: Payer: Self-pay | Admitting: Internal Medicine

## 2022-03-05 DIAGNOSIS — Z79899 Other long term (current) drug therapy: Secondary | ICD-10-CM | POA: Diagnosis not present

## 2022-03-05 DIAGNOSIS — F4310 Post-traumatic stress disorder, unspecified: Secondary | ICD-10-CM | POA: Insufficient documentation

## 2022-03-05 DIAGNOSIS — F0393 Unspecified dementia, unspecified severity, with mood disturbance: Secondary | ICD-10-CM | POA: Insufficient documentation

## 2022-03-05 DIAGNOSIS — M1990 Unspecified osteoarthritis, unspecified site: Secondary | ICD-10-CM | POA: Insufficient documentation

## 2022-03-05 DIAGNOSIS — G4730 Sleep apnea, unspecified: Secondary | ICD-10-CM | POA: Insufficient documentation

## 2022-03-05 DIAGNOSIS — Z85528 Personal history of other malignant neoplasm of kidney: Secondary | ICD-10-CM | POA: Insufficient documentation

## 2022-03-05 DIAGNOSIS — E785 Hyperlipidemia, unspecified: Secondary | ICD-10-CM | POA: Insufficient documentation

## 2022-03-05 DIAGNOSIS — F319 Bipolar disorder, unspecified: Secondary | ICD-10-CM | POA: Insufficient documentation

## 2022-03-05 DIAGNOSIS — F419 Anxiety disorder, unspecified: Secondary | ICD-10-CM | POA: Diagnosis not present

## 2022-03-05 DIAGNOSIS — Z1211 Encounter for screening for malignant neoplasm of colon: Secondary | ICD-10-CM | POA: Diagnosis not present

## 2022-03-05 DIAGNOSIS — K640 First degree hemorrhoids: Secondary | ICD-10-CM | POA: Insufficient documentation

## 2022-03-05 DIAGNOSIS — D123 Benign neoplasm of transverse colon: Secondary | ICD-10-CM | POA: Insufficient documentation

## 2022-03-05 DIAGNOSIS — I129 Hypertensive chronic kidney disease with stage 1 through stage 4 chronic kidney disease, or unspecified chronic kidney disease: Secondary | ICD-10-CM | POA: Insufficient documentation

## 2022-03-05 DIAGNOSIS — K635 Polyp of colon: Secondary | ICD-10-CM | POA: Diagnosis not present

## 2022-03-05 DIAGNOSIS — K649 Unspecified hemorrhoids: Secondary | ICD-10-CM | POA: Diagnosis not present

## 2022-03-05 DIAGNOSIS — Z905 Acquired absence of kidney: Secondary | ICD-10-CM | POA: Insufficient documentation

## 2022-03-05 DIAGNOSIS — N183 Chronic kidney disease, stage 3 unspecified: Secondary | ICD-10-CM | POA: Insufficient documentation

## 2022-03-05 DIAGNOSIS — I69354 Hemiplegia and hemiparesis following cerebral infarction affecting left non-dominant side: Secondary | ICD-10-CM | POA: Insufficient documentation

## 2022-03-05 DIAGNOSIS — Z87891 Personal history of nicotine dependence: Secondary | ICD-10-CM | POA: Diagnosis not present

## 2022-03-05 HISTORY — PX: COLONOSCOPY: SHX5424

## 2022-03-05 SURGERY — COLONOSCOPY
Anesthesia: General

## 2022-03-05 MED ORDER — LABETALOL HCL 5 MG/ML IV SOLN
INTRAVENOUS | Status: AC
  Filled 2022-03-05: qty 4

## 2022-03-05 MED ORDER — SODIUM CHLORIDE 0.9 % IV SOLN: INTRAVENOUS | Status: DC

## 2022-03-05 MED ORDER — PROPOFOL 10 MG/ML IV BOLUS
INTRAVENOUS | Status: DC | PRN
  Administered 2022-03-05: 50 mg via INTRAVENOUS

## 2022-03-05 MED ORDER — PROPOFOL 500 MG/50ML IV EMUL
INTRAVENOUS | Status: DC | PRN
  Administered 2022-03-05: 50 ug/kg/min via INTRAVENOUS

## 2022-03-05 MED ORDER — MIDAZOLAM HCL 2 MG/2ML IJ SOLN
INTRAMUSCULAR | Status: AC
  Filled 2022-03-05: qty 2

## 2022-03-05 MED ORDER — LIDOCAINE HCL (CARDIAC) PF 100 MG/5ML IV SOSY
PREFILLED_SYRINGE | INTRAVENOUS | Status: DC | PRN
  Administered 2022-03-05: 50 mg via INTRAVENOUS

## 2022-03-05 NOTE — Interval H&P Note (Signed)
History and Physical Interval Note:  03/05/2022 8:44 AM  Jake Brown  has presented today for surgery, with the diagnosis of Colon cancer screening (Z12.11).  The various methods of treatment have been discussed with the patient and family. After consideration of risks, benefits and other options for treatment, the patient has consented to  Procedure(s) with comments: COLONOSCOPY (N/A) - Needs AM case due to transportation ([facility]) as a surgical intervention.  The patient's history has been reviewed, patient examined, no change in status, stable for surgery.  I have reviewed the patient's chart and labs.  Questions were answered to the patient's satisfaction.     [place], [place]

## 2022-03-05 NOTE — H&P (Signed)
Outpatient short stay form Pre-procedure 03/05/2022 8:42 AM Jake Brown K. Alice Reichert, M.D.  Primary Physician: Orland Mustard, M.D.  Reason for visit:  Colon cancer screening  History of present illness:  Patient presents for colonoscopy for colon cancer screening. The patient denies complaints of abdominal pain, significant change in bowel habits, or rectal bleeding.  Has hx of chronic constipation and takes metamucil spradically. He has opioid dependence. Held Plavix for 5 + days prior to today's procedure.    Current Facility-Administered Medications:    0.9 %  sodium chloride infusion, , Intravenous, Continuous, Sairah Knobloch, Benay Pike, MD  Medications Prior to Admission  Medication Sig Dispense Refill Last Dose   amLODipine (NORVASC) 10 MG tablet Take 1 tablet (10 mg total) by mouth daily. Qd d/c 5 mg qd 90 tablet 3 03/05/2022   aspirin EC 81 MG tablet Take 1 tablet (81 mg total) by mouth daily. 90 tablet 3 Past Week   atorvastatin (LIPITOR) 40 MG tablet TAKE ONE TABLET BY MOUTH DAILY AT 6 P.M. 90 tablet 3 Past Week   buPROPion (WELLBUTRIN XL) 150 MG 24 hr tablet Take 1 tablet (150 mg total) by mouth daily. Dose reduction 90 tablet 0 Past Week   clopidogrel (PLAVIX) 75 MG tablet clopidogrel 75 mg tablet   02/28/2022   cyanocobalamin 1000 MCG tablet Take by mouth.   Past Week   fentaNYL (DURAGESIC) 12 MCG/HR 12 patches every 3 (three) days.   Past Week   lisinopril (ZESTRIL) 30 MG tablet Take 1 tablet (30 mg total) by mouth daily. D/c 20 90 tablet 3 03/05/2022   oxyCODONE-acetaminophen (PERCOCET) 10-325 MG tablet Take 1 tablet by mouth every 4 (four) hours as needed for pain (Q4-6 hours prn). 30 tablet  03/04/2022   sertraline (ZOLOFT) 100 MG tablet Take 1 tablet (100 mg total) by mouth daily. 90 tablet 3 03/05/2022   baclofen (LIORESAL) 10 MG tablet Take by mouth.      Blood Pressure KIT 1 kit by Does not apply route daily. Omron upper arm for adult fit to arm size 1 kit 0    Cholecalciferol  (DIALYVITE VITAMIN D3 MAX) 1.25 MG (50000 UT) TABS Dialyvite Vitamin D3 Max 1,250 mcg (50,000 unit) tablet      Galcanezumab-gnlm (EMGALITY) 120 MG/ML SOAJ Inject into the skin every 28 (twenty-eight) days. Dr. Manuella Ghazi      Nebivolol HCl 20 MG TABS TAKE 1 TABLET BY MOUTH DAILY (DISCONTINUE CARVEDILOL 25MG) 30 tablet 0    Omega-3 Fatty Acids (RA FISH OIL) 1000 MG CAPS Take by mouth.      ondansetron (ZOFRAN) 4 MG tablet Take 4 mg by mouth every 8 (eight) hours as needed for nausea or vomiting.        No Known Allergies   Past Medical History:  Diagnosis Date   Anxiety    Arthritis    Cancer of kidney ([place]) 2001   right renal carcinoma (nephrectomy )   Chronic kidney disease    stage 3    Chronic low back pain with sciatica 05/04/2019   Degenerative disc disease, cervical    Degenerative disc disease, lumbar    Dementia ([place])    Depression    Difficult intubation    no problems 01/17/12-stated "small esophagus"   Heart murmur 1983 noted   no symptoms   Hyperlipidemia    Hypertension    Nephrolithiasis    PONV (postoperative nausea and vomiting)    PTSD (post-traumatic stress disorder)    Sleep apnea  sleeps in fowler position. No CPAP at present   Stroke [facility])    09/2017     Review of systems:  Otherwise negative.    Physical Exam  Gen: Alert, oriented. Appears stated age.  HEENT: [place]/AT. PERRLA. Lungs: CTA, no wheezes. CV: RR nl S1, S2. Abd: soft, benign, no masses. BS+ Ext: No edema. Pulses 2+    Planned procedures: Proceed with colonoscopy. The patient understands the nature of the planned procedure, indications, risks, alternatives and potential complications including but not limited to bleeding, infection, perforation, damage to internal organs and possible oversedation/side effects from anesthesia. The patient agrees and gives consent to proceed.  Please refer to procedure notes for findings, recommendations and patient disposition/instructions.     Courtny Bennison  K. Alice Reichert, M.D. Gastroenterology 03/05/2022  8:42 AM

## 2022-03-05 NOTE — Anesthesia Preprocedure Evaluation (Addendum)
Anesthesia Evaluation  Patient identified by MRN, date of birth, ID band Patient awake    Reviewed: Allergy & Precautions, NPO status , Patient's Chart, lab work & pertinent test results  History of Anesthesia Complications (+) PONV, DIFFICULT AIRWAY and history of anesthetic complications  Airway Mallampati: II  TM Distance: >3 FB Neck ROM: Full    Dental  (+) Poor Dentition   Pulmonary sleep apnea , neg COPD, former smoker,    breath sounds clear to auscultation- rhonchi (-) wheezing      Cardiovascular hypertension, Pt. on medications (-) CAD, (-) Past MI, (-) Cardiac Stents and (-) CABG  Rhythm:Regular Rate:Normal - Systolic murmurs and - Diastolic murmurs    Neuro/Psych neg Seizures PSYCHIATRIC DISORDERS Anxiety Depression Bipolar Disorder CVA (L sided weakness), Residual Symptoms    GI/Hepatic negative GI ROS, Neg liver ROS,   Endo/Other  negative endocrine ROSneg diabetes  Renal/GU Renal InsufficiencyRenal disease (s/p nephrectomhy for renal cancer)S/p R nephrectomy     Musculoskeletal  (+) Arthritis ,   Abdominal (+) + obese,   Peds  Hematology negative hematology ROS (+)   Anesthesia Other Findings Base of tongue mass s/p laryngoscopy noted to be a difficult two handed mask. Intubated with video laryngoscopy.   Past Medical History: No date: Anxiety No date: Arthritis No date: Cancer of kidney ([place])     Comment:  right renal carcinoma (nephrectomy ) No date: Chronic kidney disease     Comment:  stage 3  No date: Degenerative disc disease, cervical No date: Degenerative disc disease, lumbar No date: Dementia ([place]) No date: Depression No date: Difficult intubation     Comment:  no problems 01/17/12-stated "small esophagus" 1983 noted: Heart murmur     Comment:  no symptoms No date: Hyperlipidemia No date: Hypertension No date: Nephrolithiasis No date: PONV (postoperative nausea and vomiting) No  date: PTSD (post-traumatic stress disorder) No date: Sleep apnea     Comment:  sleeps in fowler position. No CPAP at present No date: Stroke [facility])     Comment:  09/2017    Reproductive/Obstetrics                            Anesthesia Physical  Anesthesia Plan  ASA: III  Anesthesia Plan: General   Post-op Pain Management:    Induction: Intravenous  PONV Risk Score and Plan: 2 and Propofol infusion  Airway Management Planned: Natural Airway  Additional Equipment:   Intra-op Plan:   Post-operative Plan:   Informed Consent: I have reviewed the patients History and Physical, chart, labs and discussed the procedure including the risks, benefits and alternatives for the proposed anesthesia with the patient or authorized representative who has indicated his/her understanding and acceptance.     Dental advisory given  Plan Discussed with: CRNA and Anesthesiologist  Anesthesia Plan Comments:        Anesthesia Quick Evaluation

## 2022-03-05 NOTE — Transfer of Care (Signed)
Immediate Anesthesia Transfer of Care Note  Patient: Jake Brown  Procedure(s) Performed: COLONOSCOPY  Patient Location: PACU  Anesthesia Type:General  Level of Consciousness: sedated  Airway & Oxygen Therapy: Patient Spontanous Breathing and Patient connected to nasal cannula oxygen  Post-op Assessment: Report given to RN and Post -op Vital signs reviewed and stable  Post vital signs: Reviewed and stable  Last Vitals:  Vitals Value Taken Time  BP 181/102 03/05/22 0917  Temp 35.8 C 03/05/22 0916  Pulse 61 03/05/22 0918  Resp 21 03/05/22 0918  SpO2 97 % 03/05/22 0918  Vitals shown include unvalidated device data.  Last Pain:  Vitals:   03/05/22 0916  TempSrc: Temporal  PainSc: 0-No pain         Complications: No notable events documented.

## 2022-03-05 NOTE — Anesthesia Postprocedure Evaluation (Signed)
Anesthesia Post Note  Patient: Jake Brown  Procedure(s) Performed: COLONOSCOPY  Patient location during evaluation: PACU Anesthesia Type: General Level of consciousness: awake and alert Pain management: pain level controlled Vital Signs Assessment: post-procedure vital signs reviewed and stable Respiratory status: spontaneous breathing, nonlabored ventilation and respiratory function stable Cardiovascular status: blood pressure returned to baseline and stable Postop Assessment: no apparent nausea or vomiting Anesthetic complications: no   No notable events documented.   Last Vitals:  Vitals:   03/05/22 0926 03/05/22 0936  BP: (!) 155/107 (!) 168/99  Pulse:    Resp:    Temp:    SpO2:  98%    Last Pain:  Vitals:   03/05/22 0936  TempSrc:   PainSc: 0-No pain                 [place] Ouch

## 2022-03-05 NOTE — Op Note (Signed)
[facility] Patient Name: Jake Brown Procedure Date: 03/05/2022 8:50 AM MRN: [id number] Account #: [id number] Date of Birth: June 05, 1966 Admit Type: Outpatient Age: 56 Room: [facility] ENDO ROOM 2 Gender: Male Note Status: Finalized Instrument Name: Jasper Riling [id number] Procedure:             Colonoscopy Indications:           Screening for colorectal malignant neoplasm Providers:             Lorie Apley K. Birttany Dechellis MD, MD Medicines:             Propofol per Anesthesia Complications:         No immediate complications. Procedure:             Pre-Anesthesia Assessment:                        - The risks and benefits of the procedure and the                         sedation options and risks were discussed with the                         patient. All questions were answered and informed                         consent was obtained.                        - Patient identification and proposed procedure were                         verified prior to the procedure by the nurse. The                         procedure was verified in the procedure room.                        - ASA Grade Assessment: III - A patient with severe                         systemic disease.                        - After reviewing the risks and benefits, the patient                         was deemed in satisfactory condition to undergo the                         procedure.                        After obtaining informed consent, the colonoscope was                         passed under direct vision. Throughout the procedure,                         the patient's blood pressure, pulse, and oxygen  saturations were monitored continuously. The                         Colonoscope was introduced through the anus and                         advanced to the the cecum, identified by appendiceal                         orifice and ileocecal valve. The colonoscopy was                          performed without difficulty. The patient tolerated                         the procedure well. The quality of the bowel                         preparation was good. The ileocecal valve, appendiceal                         orifice, and rectum were photographed. Findings:      The perianal and digital rectal examinations were normal. Pertinent       negatives include normal sphincter tone and no palpable rectal lesions.      Non-bleeding internal hemorrhoids were found during retroflexion. The       hemorrhoids were Grade I (internal hemorrhoids that do not prolapse).      Two semi-pedunculated polyps were found in the transverse colon. The       polyps were 8 to 11 mm in size. These polyps were removed with a hot       snare. Resection and retrieval were complete.      The exam was otherwise without abnormality. Impression:            - Non-bleeding internal hemorrhoids.                        - Two 8 to 11 mm polyps in the transverse colon,                         removed with a hot snare. Resected and retrieved.                        - The examination was otherwise normal. Recommendation:        - Patient has a contact number available for                         emergencies. The signs and symptoms of potential                         delayed complications were discussed with the patient.                         Return to normal activities tomorrow. Written                         discharge instructions were provided to the patient.                        -  Resume previous diet.                        - Continue present medications.                        - Resume Plavix (clopidogrel) at prior dose today.                         Refer to managing physician for further adjustment of                         therapy.                        - Repeat colonoscopy is recommended for surveillance.                         The colonoscopy date will be determined after                          pathology results from today's exam become available                         for review.                        - Return to GI office PRN.                        - The findings and recommendations were discussed with                         the patient. Procedure Code(s):     --- Professional ---                        [phone], Colonoscopy, flexible; with removal of                         tumor(s), polyp(s), or other lesion(s) by snare                         technique Diagnosis Code(s):     --- Professional ---                        K64.0, First degree hemorrhoids                        K63.5, Polyp of colon                        Z12.11, Encounter for screening for malignant neoplasm                         of colon CPT copyright 2019 American Medical Association. All rights reserved. The codes documented in this report are preliminary and upon coder review may  be revised to meet current compliance requirements. Efrain Sella MD, MD 03/05/2022 9:17:37 AM This report has been signed electronically. Number of Addenda: 0 Note Initiated On: 03/05/2022 8:50 AM Scope Withdrawal Time: 0 hours 5 minutes 58 seconds  Total Procedure Duration: 0 hours 12 minutes 54 seconds  Estimated Blood Loss:  Estimated blood loss: none.      [facility]

## 2022-03-06 ENCOUNTER — Encounter: Payer: Medicare Other | Admitting: Physical Therapy

## 2022-03-06 ENCOUNTER — Encounter: Payer: Self-pay | Admitting: Internal Medicine

## 2022-03-06 LAB — SURGICAL PATHOLOGY

## 2022-03-06 LAB — HM COLONOSCOPY

## 2022-03-10 DIAGNOSIS — M25561 Pain in right knee: Secondary | ICD-10-CM | POA: Diagnosis not present

## 2022-03-10 DIAGNOSIS — M48062 Spinal stenosis, lumbar region with neurogenic claudication: Secondary | ICD-10-CM | POA: Diagnosis not present

## 2022-03-10 DIAGNOSIS — M25511 Pain in right shoulder: Secondary | ICD-10-CM | POA: Diagnosis not present

## 2022-03-10 DIAGNOSIS — M5412 Radiculopathy, cervical region: Secondary | ICD-10-CM | POA: Diagnosis not present

## 2022-03-11 ENCOUNTER — Encounter: Payer: Medicare Other | Admitting: Physical Therapy

## 2022-03-13 ENCOUNTER — Encounter: Payer: Medicare Other | Admitting: Physical Therapy

## 2022-03-18 ENCOUNTER — Encounter: Payer: Medicare Other | Admitting: Physical Therapy

## 2022-03-20 ENCOUNTER — Encounter: Payer: Medicare Other | Admitting: Physical Therapy

## 2022-03-20 DIAGNOSIS — Z0289 Encounter for other administrative examinations: Secondary | ICD-10-CM

## 2022-03-23 ENCOUNTER — Other Ambulatory Visit: Payer: Self-pay | Admitting: Internal Medicine

## 2022-03-23 DIAGNOSIS — I10 Essential (primary) hypertension: Secondary | ICD-10-CM

## 2022-03-24 ENCOUNTER — Encounter: Payer: Self-pay | Admitting: Internal Medicine

## 2022-04-01 ENCOUNTER — Ambulatory Visit (INDEPENDENT_AMBULATORY_CARE_PROVIDER_SITE_OTHER): Payer: Medicare Other | Admitting: Family Medicine

## 2022-04-01 ENCOUNTER — Encounter (INDEPENDENT_AMBULATORY_CARE_PROVIDER_SITE_OTHER): Payer: Self-pay | Admitting: Family Medicine

## 2022-04-01 VITALS — BP 142/92 | HR 53 | Temp 97.6°F | Ht 68.0 in | Wt 291.0 lb

## 2022-04-01 DIAGNOSIS — R0602 Shortness of breath: Secondary | ICD-10-CM | POA: Diagnosis not present

## 2022-04-01 DIAGNOSIS — I639 Cerebral infarction, unspecified: Secondary | ICD-10-CM

## 2022-04-01 DIAGNOSIS — I509 Heart failure, unspecified: Secondary | ICD-10-CM | POA: Insufficient documentation

## 2022-04-01 DIAGNOSIS — R5383 Other fatigue: Secondary | ICD-10-CM | POA: Diagnosis not present

## 2022-04-01 DIAGNOSIS — M549 Dorsalgia, unspecified: Secondary | ICD-10-CM | POA: Diagnosis not present

## 2022-04-01 DIAGNOSIS — M5136 Other intervertebral disc degeneration, lumbar region: Secondary | ICD-10-CM | POA: Diagnosis not present

## 2022-04-01 DIAGNOSIS — I10 Essential (primary) hypertension: Secondary | ICD-10-CM | POA: Diagnosis not present

## 2022-04-01 DIAGNOSIS — E559 Vitamin D deficiency, unspecified: Secondary | ICD-10-CM

## 2022-04-01 DIAGNOSIS — G8929 Other chronic pain: Secondary | ICD-10-CM

## 2022-04-01 DIAGNOSIS — Z6841 Body Mass Index (BMI) 40.0 and over, adult: Secondary | ICD-10-CM

## 2022-04-01 DIAGNOSIS — E1169 Type 2 diabetes mellitus with other specified complication: Secondary | ICD-10-CM | POA: Insufficient documentation

## 2022-04-01 DIAGNOSIS — F329 Major depressive disorder, single episode, unspecified: Secondary | ICD-10-CM

## 2022-04-01 DIAGNOSIS — M47817 Spondylosis without myelopathy or radiculopathy, lumbosacral region: Secondary | ICD-10-CM | POA: Diagnosis not present

## 2022-04-04 ENCOUNTER — Other Ambulatory Visit: Payer: Self-pay | Admitting: Neurosurgery

## 2022-04-04 DIAGNOSIS — M47817 Spondylosis without myelopathy or radiculopathy, lumbosacral region: Secondary | ICD-10-CM

## 2022-04-07 LAB — COMPREHENSIVE METABOLIC PANEL
ALT: 15 IU/L (ref 0–44)
AST: 14 IU/L (ref 0–40)
Albumin/Globulin Ratio: 1.9 (ref 1.2–2.2)
Albumin: 4.6 g/dL (ref 3.8–4.9)
Alkaline Phosphatase: 91 IU/L (ref 44–121)
BUN/Creatinine Ratio: 12 (ref 9–20)
BUN: 19 mg/dL (ref 6–24)
Bilirubin Total: 0.7 mg/dL (ref 0.0–1.2)
CO2: 23 mmol/L (ref 20–29)
Calcium: 9.4 mg/dL (ref 8.7–10.2)
Chloride: 101 mmol/L (ref 96–106)
Creatinine, Ser: 1.54 mg/dL — ABNORMAL HIGH (ref 0.76–1.27)
Globulin, Total: 2.4 g/dL (ref 1.5–4.5)
Glucose: 80 mg/dL (ref 70–99)
Potassium: 4.4 mmol/L (ref 3.5–5.2)
Sodium: 141 mmol/L (ref 134–144)
Total Protein: 7 g/dL (ref 6.0–8.5)
eGFR: 52 mL/min/{1.73_m2} — ABNORMAL LOW (ref >59)

## 2022-04-07 LAB — HEMOGLOBIN A1C
Est. average glucose Bld gHb Est-mCnc: 126 mg/dL
Hgb A1c MFr Bld: 6 % — ABNORMAL HIGH (ref 4.8–5.6)

## 2022-04-07 LAB — VITAMIN B12: Vitamin B-12: 1442 pg/mL — ABNORMAL HIGH (ref 232–1245)

## 2022-04-07 LAB — INSULIN, RANDOM: INSULIN: 15.3 u[IU]/mL (ref 2.6–24.9)

## 2022-04-07 LAB — VITAMIN D, 25-HYDROXY, TOTAL: Vitamin D, 25-Hydroxy, Serum: 37 ng/mL

## 2022-04-08 NOTE — Progress Notes (Signed)
Chief Complaint:   Jake Jake Brown (MR# [id number]) is a 56 y.o. male who presents for evaluation and treatment of obesity and related comorbidities. Current BMI is Body mass index is 44.25 kg/m. Jake Jake Brown has been struggling with his weight for many years and has been unsuccessful in either losing weight, maintaining weight loss, or reaching his healthy weight goal.  Jake Jake Brown's gain started after renal cell carcinoma at age 44.  Was 175 pounds prior to this.  Stress levels have contributed to weight gain.  Likes comfort foods.  Denies big portions but food choices are not very healthy.  Not a picky eater.  Drinks sweet tea.  Never on AOM.  Exercise is limited with chronic back pain and left side weakness due to a stroke in 2019.  Jake Jake Brown is currently in the action stage of change and ready to dedicate time achieving and maintaining a healthier weight. Jake Jake Brown is interested in becoming our patient and working on intensive lifestyle modifications including (but not limited to) diet and exercise for weight loss.  Jake Jake Brown's habits were reviewed today and are as follows: his desired weight loss is 91 lbs, he started gaining weight 20 years ago, his heaviest weight ever was 291 pounds, he has significant food cravings issues, he skips meals frequently, he is frequently drinking liquids with calories, he frequently makes poor food choices, and he struggles with emotional eating.  Depression Screen Jake Jake Brown's Food and Mood (modified PHQ-9) score was 8.     04/01/2022    9:18 AM  Depression screen PHQ 2/9  Decreased Interest 1  Down, Depressed, Hopeless 1  PHQ - 2 Score 2  Altered sleeping 1  Tired, decreased energy 1  Change in appetite 1  Feeling bad or failure about yourself  1  Trouble concentrating 2  Moving slowly or fidgety/restless 0  Suicidal thoughts 1  PHQ-9 Score 9  Difficult doing work/chores Somewhat difficult   Subjective:   1. Other fatigue Jake Jake Brown admits to daytime  somnolence and  patient did not respond  waking up still tired. Patient has a history of symptoms of daytime fatigue. Jake Jake Brown generally gets 6 hours of sleep per night, and states that he has patient did not respond. Snoring (patient did not respond) present. Apneic episodes (patient did not respond) present. Epworth Sleepiness Score is 4.   2. SOBOE (shortness of breath on exertion) Jake Jake Brown notes increasing shortness of breath with exercising and seems to be worsening over time with weight gain. He notes getting out of breath sooner with activity than he used to. This has not gotten worse recently. Jake Jake Brown denies shortness of breath at rest or orthopnea.  3. Essential hypertension Jake Jake Brown's pressure is elevated today.  He is on Norvasc 10 mg daily, lisinopril 30 mg daily, and nebivolol 20 mg daily.  4. Major depressive disorder with current active episode, unspecified depression episode severity, unspecified whether recurrent Jake Jake Brown is on Zoloft 100 mg and Wellbutrin XL 150 mg daily.  His PHQ-9 score is 8-managed.   5. Cerebrovascular accident (CVA), unspecified mechanism (Jake Jake Brown usually ambulates with a cane.  He has access to an apartment gym.  Induced by stress in 2019.  6. Chronic back pain, unspecified back location, unspecified back pain laterality Managed by Jake Jake Brown, pain management.  He is seen in neurosurgery today for herniated disc.  He hopes to get back into hiking in the future.  7. Vitamin D deficiency Jake Brown's last vitamin D level was  low at 22.5 on 10/01/2021.  He is taking vitamin D OTC, unsure of dose.  Assessment/Plan:   1. Other fatigue Jake Jake Brown does feel that his weight is causing his energy to be lower than it should be. Fatigue may be related to obesity, depression or many other causes. Labs will be ordered, and in the meanwhile, Jake Jake Brown will focus on self care including making healthy food choices, increasing physical activity and focusing on stress  reduction.  - EKG 12-Lead - Comprehensive metabolic panel - Vitamin D, 25-Hydroxy, Total - Vitamin B12 - Hemoglobin A1c - Insulin, random  2. SOBOE (shortness of breath on exertion) Jake Jake Brown does feel that he gets out of breath more easily that he used to when he exercises. Jake Jake Brown's shortness of breath appears to be obesity related and exercise induced. He has agreed to work on weight loss and gradually increase exercise to treat his exercise induced shortness of breath. Will continue to monitor closely.  3. Essential hypertension Jake Jake Brown will continue his current blood pressure medications, and look for blood pressure improvements with weight loss.  4. Major depressive disorder with current active episode, unspecified depression episode severity, unspecified whether recurrent Jake Jake Brown will continue his current medications, and consider adding cognitive behavioral therapy with Dr. Mallie Mussel, our bariatric psychologist.  5. Cerebrovascular accident (CVA), unspecified mechanism (Jake Jake Brown) s/p left side Jake Brown Jake Jake Brown will continue aspirin and Plavix.  6. Chronic back pain, unspecified back location, unspecified back pain laterality Jake Jake Brown will continue his current treatment plan per pain management.  He will work on weight reduction.  7. Vitamin D deficiency We will check labs today.  Jake Jake Brown will begin prescription vitamin D if indicated.  8. Obesity, current BMI 45.7 Jake Jake Brown is currently in the action stage of change and his goal is to continue with weight loss efforts. I recommend Jake Jake Brown begin the structured treatment plan as follows:  He has agreed to the Category 3 Plan with 100-120 grams of protein daily.  Exercise goals: As is.    Behavioral modification strategies: increasing lean protein intake, increasing vegetables, increasing water intake, decreasing liquid calories, increasing high fiber foods, decreasing eating out, no skipping meals, meal planning and cooking strategies, keeping healthy foods  in the home, better snacking choices, planning for success, and decreasing junk food.  He was informed of the importance of frequent follow-up visits to maximize his success with intensive lifestyle modifications for his multiple health conditions. He was informed we would discuss his lab results at his next visit unless there is a critical issue that needs to be addressed sooner. Jake Jake Brown agreed to keep his next visit at the agreed upon time to discuss these results.  Objective:   Blood pressure (!) 142/92, pulse (!) 53, temperature 97.6 F (36.4 C), height '5\' 8"'$  (1.727 m), weight 291 lb (132 kg), SpO2 92 %. Body mass index is 44.25 kg/m.  EKG: Normal sinus rhythm, rate 57 BPM.  Indirect Calorimeter completed today shows a VO2 of 284 and a REE of 1958.  His calculated basal metabolic rate is 7353 thus his basal metabolic rate is worse than expected.  General: Cooperative, alert, well developed, in no acute distress. HEENT: Conjunctivae and lids unremarkable. Cardiovascular: Regular rhythm.  Lungs: Normal work of breathing. Neurologic: No focal Jake Brown.   Lab Results  Component Value Date   CREATININE 1.54 (H) 04/01/2022   BUN 19 04/01/2022   NA 141 04/01/2022   K 4.4 04/01/2022   CL 101 04/01/2022   CO2 23 04/01/2022   Lab Results  Component Value Date   ALT 15 04/01/2022   AST 14 04/01/2022   ALKPHOS 91 04/01/2022   BILITOT 0.7 04/01/2022   Lab Results  Component Value Date   HGBA1C 6.0 (H) 04/01/2022   HGBA1C 6.2 10/01/2021   HGBA1C 6.0 02/21/2021   HGBA1C 5.8 10/03/2020   HGBA1C 6.0 05/16/2020   Lab Results  Component Value Date   INSULIN 15.3 04/01/2022   Lab Results  Component Value Date   TSH 0.47 10/01/2021   Lab Results  Component Value Date   CHOL 161 10/01/2021   HDL 46.30 10/01/2021   LDLCALC 85 10/01/2021   TRIG 147.0 10/01/2021   CHOLHDL 3 10/01/2021   Lab Results  Component Value Date   WBC 9.5 10/01/2021   HGB 15.0 10/01/2021   HCT 45.1  10/01/2021   MCV 88.9 10/01/2021   PLT 224.0 10/01/2021   No results found for: "IRON", "TIBC", "FERRITIN" Obesity Behavioral Intervention:   Approximately 15 minutes were spent on the discussion below.  ASK: We discussed the diagnosis of obesity with Jake Jake Brown today and Eryk agreed to give [place] permission to discuss obesity behavioral modification therapy today.  ASSESS: Azavier has the diagnosis of obesity and his BMI today is 45.7. Demetri is in the action stage of change.   ADVISE: Myking was educated on the multiple health risks of obesity as well as the benefit of weight loss to improve his health. He was advised of the need for long term treatment and the importance of lifestyle modifications to improve his current health and to decrease his risk of future health problems.  AGREE: Multiple dietary modification options and treatment options were discussed and Juleon agreed to follow the recommendations documented in the above note.  ARRANGE: Jammy was educated on the importance of frequent visits to treat obesity as outlined per CMS and USPSTF guidelines and agreed to schedule his next follow up appointment today.  Attestation Statements:   Reviewed by clinician on day of visit: allergies, medications, problem list, medical history, surgical history, family history, social history, and previous encounter notes.   Wilhemena Durie, am acting as transcriptionist for Loyal Gambler, DO.  I have reviewed the above documentation for accuracy and completeness, and I agree with the above. Dell Ponto, DO

## 2022-04-15 ENCOUNTER — Ambulatory Visit (INDEPENDENT_AMBULATORY_CARE_PROVIDER_SITE_OTHER): Payer: Medicare Other | Admitting: Family Medicine

## 2022-04-15 ENCOUNTER — Encounter (INDEPENDENT_AMBULATORY_CARE_PROVIDER_SITE_OTHER): Payer: Self-pay | Admitting: Family Medicine

## 2022-04-15 VITALS — BP 155/92 | HR 52 | Temp 97.8°F | Ht 68.0 in | Wt 289.0 lb

## 2022-04-15 DIAGNOSIS — E559 Vitamin D deficiency, unspecified: Secondary | ICD-10-CM

## 2022-04-15 DIAGNOSIS — I10 Essential (primary) hypertension: Secondary | ICD-10-CM | POA: Insufficient documentation

## 2022-04-15 DIAGNOSIS — I129 Hypertensive chronic kidney disease with stage 1 through stage 4 chronic kidney disease, or unspecified chronic kidney disease: Secondary | ICD-10-CM

## 2022-04-15 DIAGNOSIS — M519 Unspecified thoracic, thoracolumbar and lumbosacral intervertebral disc disorder: Secondary | ICD-10-CM | POA: Diagnosis not present

## 2022-04-15 DIAGNOSIS — N183 Chronic kidney disease, stage 3 unspecified: Secondary | ICD-10-CM | POA: Insufficient documentation

## 2022-04-15 DIAGNOSIS — R7303 Prediabetes: Secondary | ICD-10-CM | POA: Diagnosis not present

## 2022-04-15 DIAGNOSIS — Z6841 Body Mass Index (BMI) 40.0 and over, adult: Secondary | ICD-10-CM

## 2022-04-15 DIAGNOSIS — E669 Obesity, unspecified: Secondary | ICD-10-CM

## 2022-04-15 MED ORDER — VITAMIN D (ERGOCALCIFEROL) 1.25 MG (50000 UNIT) PO CAPS: 50000.0000 [IU] | ORAL_CAPSULE | ORAL | 0 refills | Status: DC

## 2022-04-16 ENCOUNTER — Ambulatory Visit
Admission: RE | Admit: 2022-04-16 | Discharge: 2022-04-16 | Disposition: A | Discharge status: Home / Self Care | Payer: Medicare Other | Source: Ambulatory Visit | Attending: Neurosurgery | Admitting: Neurosurgery

## 2022-04-16 DIAGNOSIS — M5127 Other intervertebral disc displacement, lumbosacral region: Secondary | ICD-10-CM | POA: Diagnosis not present

## 2022-04-16 DIAGNOSIS — M47817 Spondylosis without myelopathy or radiculopathy, lumbosacral region: Secondary | ICD-10-CM | POA: Insufficient documentation

## 2022-04-17 NOTE — Progress Notes (Signed)
Chief Complaint:   OBESITY Jake Brown is here to discuss his progress with his obesity treatment plan along with follow-up of his obesity related diagnoses. Ole is on the Category 3 Plan+ 100-120 protein daily and states he is following his eating plan approximately 60% of the time. Norberto states he is not exercising.   Today's visit was #: 2 Starting weight: 291 lbs Starting date: 04/01/2022 Today's weight: 289 lbs Today's date: 04/15/2022 Total lbs lost to date: 2 lbs Total lbs lost since last in-office visit: 2 lbs  Interim History: Doing better with getting meals in.  Having 2 snack sized D[place] Clap sausage biscuits with black coffee and half and half.  Sometimes skips lunch or has beef jerky with coffee.  Drinks more water.  Off SSB's, other than sweet tea.  Lacking protein with dinner.  Not liking much meat.    Subjective:   1. Lumbar disc disease MRI scheduled.  Has follow up with Neurosurgeon.  Seeing pain management with radiculopathy.   2. Vitamin D deficiency Vitamin D 37, was on OTC Vitamin D.    3. Essential hypertension Blood pressure elevated today, on Norvasc 10 mg and Nebivolol  20 mg daily and lisinopril 30 mg daily.   4. Pre-diabetes A1c 6, fasting insulin 15.3.  Denies family history of T2DM.  5. Stage 3 chronic kidney disease, unspecified whether stage 3a or 3b CKD (HCC) GFR 52, Creatinine 1.54, stable.  S/P right nephrectomy, not on NSAID's, has hypertension.    Assessment/Plan:   1. Lumbar disc disease Follow up with Neurosurgeon, exercise as tolerated.    2. Vitamin D deficiency Start - Vitamin D, Ergocalciferol, (DRISDOL) 1.25 MG (50000 UNIT) CAPS capsule; Take 1 capsule (50,000 Units total) by mouth every 7 (seven) days.  Dispense: 5 capsule; Refill: 0  3. Essential hypertension Continue lower sodium diet and weight loss.   4. Pre-diabetes Consider adding metformin.   5. Stage 3 chronic kidney disease, unspecified whether stage 3a or 3b CKD  ([place]) Follow up with PCP, manage blood pressure as well.   6. Obesity,current BMI 44.0 1) Reviewed alternative breakfast options.  Jimmy Dean Delites English Muffin (cautioned about sodium quantity) 2) Vegetarian meal plan given. 3) Discussed option of WLS.  4) Sugar alternatives discussed.    Eliaz is currently in the action stage of change. As such, his goal is to continue with weight loss efforts. He has agreed to the [place].   Exercise goals: All adults should avoid inactivity. Some physical activity is better than none, and adults who participate in any amount of physical activity gain some health benefits.  Behavioral modification strategies: increasing lean protein intake, decreasing simple carbohydrates, increasing vegetables, increasing water intake, decreasing eating out, no skipping meals, keeping healthy foods in the home, and decreasing junk food.  Migel has agreed to follow-up with our clinic in 3 weeks. He was informed of the importance of frequent follow-up visits to maximize his success with intensive lifestyle modifications for his multiple health conditions.   Objective:   Blood pressure (!) 155/92, pulse (!) 52, temperature 97.8 F (36.6 C), height '5\' 8"'$  (1.727 m), weight 289 lb (131.1 kg), SpO2 94 %. Body mass index is 43.94 kg/m.  General: Cooperative, alert, well developed, in no acute distress. HEENT: Conjunctivae and lids unremarkable. Cardiovascular: Regular rhythm.  Lungs: Normal work of breathing. Neurologic: No focal deficits.   Lab Results  Component Value Date   CREATININE 1.54 (H) 04/01/2022   BUN 19  04/01/2022   NA 141 04/01/2022   K 4.4 04/01/2022   CL 101 04/01/2022   CO2 23 04/01/2022   Lab Results  Component Value Date   ALT 15 04/01/2022   AST 14 04/01/2022   ALKPHOS 91 04/01/2022   BILITOT 0.7 04/01/2022   Lab Results  Component Value Date   HGBA1C 6.0 (H) 04/01/2022   HGBA1C 6.2 10/01/2021   HGBA1C 6.0 02/21/2021    HGBA1C 5.8 10/03/2020   HGBA1C 6.0 05/16/2020   Lab Results  Component Value Date   INSULIN 15.3 04/01/2022   Lab Results  Component Value Date   TSH 0.47 10/01/2021   Lab Results  Component Value Date   CHOL 161 10/01/2021   HDL 46.30 10/01/2021   LDLCALC 85 10/01/2021   TRIG 147.0 10/01/2021   CHOLHDL 3 10/01/2021   Lab Results  Component Value Date   VD25OH 22.54 (L) 10/01/2021   VD25OH 33.89 05/16/2020   VD25OH 20.22 (L) 05/05/2019   Lab Results  Component Value Date   WBC 9.5 10/01/2021   HGB 15.0 10/01/2021   HCT 45.1 10/01/2021   MCV 88.9 10/01/2021   PLT 224.0 10/01/2021   No results found for: "IRON", "TIBC", "FERRITIN"  Obesity Behavioral Intervention:   Approximately 15 minutes were spent on the discussion below.  ASK: We discussed the diagnosis of obesity with [name] today and Javares agreed to give [place] permission to discuss obesity behavioral modification therapy today.  ASSESS: Keonta has the diagnosis of obesity and his BMI today is 44.0. Joren is in the action stage of change.   ADVISE: Izeah was educated on the multiple health risks of obesity as well as the benefit of weight loss to improve his health. He was advised of the need for long term treatment and the importance of lifestyle modifications to improve his current health and to decrease his risk of future health problems.  AGREE: Multiple dietary modification options and treatment options were discussed and Rylin agreed to follow the recommendations documented in the above note.  ARRANGE: Amaru was educated on the importance of frequent visits to treat obesity as outlined per CMS and USPSTF guidelines and agreed to schedule his next follow up appointment today.  Attestation Statements:   Reviewed by clinician on day of visit: allergies, medications, problem list, medical history, surgical history, family history, social history, and previous encounter notes.  I, Davy Pique, am acting as  Location manager for Loyal Gambler, DO.  I have reviewed the above documentation for accuracy and completeness, and I agree with the above. Dell Ponto, DO

## 2022-04-23 DIAGNOSIS — M47817 Spondylosis without myelopathy or radiculopathy, lumbosacral region: Secondary | ICD-10-CM | POA: Diagnosis not present

## 2022-04-23 DIAGNOSIS — M4807 Spinal stenosis, lumbosacral region: Secondary | ICD-10-CM | POA: Diagnosis not present

## 2022-04-23 DIAGNOSIS — M4317 Spondylolisthesis, lumbosacral region: Secondary | ICD-10-CM | POA: Diagnosis not present

## 2022-04-23 DIAGNOSIS — M5417 Radiculopathy, lumbosacral region: Secondary | ICD-10-CM | POA: Diagnosis not present

## 2022-04-29 ENCOUNTER — Encounter: Payer: Self-pay | Admitting: Internal Medicine

## 2022-04-29 ENCOUNTER — Ambulatory Visit (INDEPENDENT_AMBULATORY_CARE_PROVIDER_SITE_OTHER): Payer: Medicare Other | Admitting: Internal Medicine

## 2022-04-29 VITALS — BP 148/84 | HR 89 | Temp 97.7°F | Ht 68.0 in | Wt 286.8 lb

## 2022-04-29 DIAGNOSIS — F32A Depression, unspecified: Secondary | ICD-10-CM

## 2022-04-29 DIAGNOSIS — R7303 Prediabetes: Secondary | ICD-10-CM | POA: Diagnosis not present

## 2022-04-29 DIAGNOSIS — Z01818 Encounter for other preprocedural examination: Secondary | ICD-10-CM

## 2022-04-29 DIAGNOSIS — F419 Anxiety disorder, unspecified: Secondary | ICD-10-CM

## 2022-04-29 DIAGNOSIS — E785 Hyperlipidemia, unspecified: Secondary | ICD-10-CM

## 2022-04-29 DIAGNOSIS — R937 Abnormal findings on diagnostic imaging of other parts of musculoskeletal system: Secondary | ICD-10-CM | POA: Diagnosis not present

## 2022-04-29 DIAGNOSIS — Z8673 Personal history of transient ischemic attack (TIA), and cerebral infarction without residual deficits: Secondary | ICD-10-CM

## 2022-04-29 DIAGNOSIS — I10 Essential (primary) hypertension: Secondary | ICD-10-CM

## 2022-04-29 MED ORDER — LISINOPRIL 30 MG PO TABS: 30.0000 mg | ORAL_TABLET | Freq: Every day | ORAL | 3 refills | Status: DC

## 2022-04-29 MED ORDER — NEBIVOLOL HCL 20 MG PO TABS: ORAL_TABLET | ORAL | 3 refills | Status: DC

## 2022-04-29 MED ORDER — SERTRALINE HCL 100 MG PO TABS
100.0000 mg | ORAL_TABLET | Freq: Every day | ORAL | 3 refills | Status: DC
Start: 2022-04-29 — End: 2023-01-14

## 2022-04-29 MED ORDER — AMLODIPINE BESYLATE 10 MG PO TABS: 10.0000 mg | ORAL_TABLET | Freq: Every day | ORAL | 3 refills | Status: DC

## 2022-04-29 MED ORDER — ATORVASTATIN CALCIUM 40 MG PO TABS: ORAL_TABLET | ORAL | 3 refills | Status: DC

## 2022-04-29 MED ORDER — CLOPIDOGREL BISULFATE 75 MG PO TABS: ORAL_TABLET | ORAL | 3 refills | Status: DC

## 2022-04-29 NOTE — Patient Instructions (Addendum)
Colonoscopy due 03/06/2029  If lisinopril is NOT controlled blood pressure change to losartan 50 mg daily  Goal Blood pressure <130/<80
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
## 2022-04-30 ENCOUNTER — Telehealth: Payer: Self-pay

## 2022-04-30 ENCOUNTER — Telehealth: Payer: Self-pay | Admitting: Internal Medicine

## 2022-04-30 NOTE — Telephone Encounter (Signed)
I dont have a form yet this would go to Dr. Babs Bertin Neurosurgery he will have back surgery TBD date Call [facility]

## 2022-04-30 NOTE — Telephone Encounter (Signed)
Kelli from [place] office called stating they need the Pre-op clearance form faxed to them Fax [phone]

## 2022-04-30 NOTE — Telephone Encounter (Signed)
Jake Brown called from Dr. Derrick Ravel office at [facility] to state she was letting Jake Brown, CMA, know that they have not received a surgical clearance form for patient.  Jake Brown states we can fax the form to Dr. Serafina Royals at [phone] when we receive it.

## 2022-05-01 ENCOUNTER — Telehealth: Payer: Self-pay | Admitting: Internal Medicine

## 2022-05-01 NOTE — Telephone Encounter (Signed)
Called front desk at [facility] to get pre op clearance  Sent pre op clearance to Dr. Raliegh Ip for Dr. Arnoldo Morale

## 2022-05-01 NOTE — Telephone Encounter (Signed)
In order to have low back surgery pt needs pre-op clearance form faxed to Dr. Raliegh Ip cardiology   Phone Fax E-mail Address  [phone] Not available [address]   [facility]   [place] [address]

## 2022-05-01 NOTE — Telephone Encounter (Signed)
[place] office has been informed Jake Brown was not available at the time I told them we do not have the form front desk lady stated "well soemone needs to get [place] the form" I stated we can not send you something we do not have. You can reach and ask for Ms. Jake Brown at [phone].

## 2022-05-01 NOTE — Telephone Encounter (Signed)
Note has been faxed with a confirmed transmission log

## 2022-05-05 DIAGNOSIS — M5412 Radiculopathy, cervical region: Secondary | ICD-10-CM | POA: Diagnosis not present

## 2022-05-05 DIAGNOSIS — M48062 Spinal stenosis, lumbar region with neurogenic claudication: Secondary | ICD-10-CM | POA: Diagnosis not present

## 2022-05-05 DIAGNOSIS — M25561 Pain in right knee: Secondary | ICD-10-CM | POA: Diagnosis not present

## 2022-05-05 DIAGNOSIS — M5416 Radiculopathy, lumbar region: Secondary | ICD-10-CM | POA: Diagnosis not present

## 2022-05-13 ENCOUNTER — Telehealth (INDEPENDENT_AMBULATORY_CARE_PROVIDER_SITE_OTHER): Payer: Medicare Other | Admitting: Family Medicine

## 2022-05-13 DIAGNOSIS — G8929 Other chronic pain: Secondary | ICD-10-CM | POA: Diagnosis not present

## 2022-05-13 DIAGNOSIS — R7303 Prediabetes: Secondary | ICD-10-CM

## 2022-05-13 DIAGNOSIS — K529 Noninfective gastroenteritis and colitis, unspecified: Secondary | ICD-10-CM | POA: Insufficient documentation

## 2022-05-13 DIAGNOSIS — M545 Low back pain, unspecified: Secondary | ICD-10-CM | POA: Insufficient documentation

## 2022-05-13 DIAGNOSIS — E559 Vitamin D deficiency, unspecified: Secondary | ICD-10-CM | POA: Diagnosis not present

## 2022-05-13 DIAGNOSIS — E669 Obesity, unspecified: Secondary | ICD-10-CM

## 2022-05-13 DIAGNOSIS — Z6841 Body Mass Index (BMI) 40.0 and over, adult: Secondary | ICD-10-CM

## 2022-05-13 MED ORDER — VITAMIN D (ERGOCALCIFEROL) 1.25 MG (50000 UNIT) PO CAPS: 50000.0000 [IU] | ORAL_CAPSULE | ORAL | 0 refills | Status: DC

## 2022-05-16 ENCOUNTER — Other Ambulatory Visit (INDEPENDENT_AMBULATORY_CARE_PROVIDER_SITE_OTHER): Payer: Self-pay | Admitting: Family Medicine

## 2022-05-16 DIAGNOSIS — E559 Vitamin D deficiency, unspecified: Secondary | ICD-10-CM

## 2022-05-19 ENCOUNTER — Encounter (INDEPENDENT_AMBULATORY_CARE_PROVIDER_SITE_OTHER): Payer: Self-pay

## 2022-05-21 ENCOUNTER — Ambulatory Visit (INDEPENDENT_AMBULATORY_CARE_PROVIDER_SITE_OTHER): Payer: Medicare Other | Admitting: Dermatology

## 2022-05-21 DIAGNOSIS — D2330 Other benign neoplasm of skin of unspecified part of face: Secondary | ICD-10-CM

## 2022-05-21 DIAGNOSIS — Z808 Family history of malignant neoplasm of other organs or systems: Secondary | ICD-10-CM

## 2022-05-21 DIAGNOSIS — L811 Chloasma: Secondary | ICD-10-CM | POA: Diagnosis not present

## 2022-05-21 DIAGNOSIS — L814 Other melanin hyperpigmentation: Secondary | ICD-10-CM

## 2022-05-21 DIAGNOSIS — Z1283 Encounter for screening for malignant neoplasm of skin: Secondary | ICD-10-CM

## 2022-05-21 DIAGNOSIS — D225 Melanocytic nevi of trunk: Secondary | ICD-10-CM

## 2022-05-21 DIAGNOSIS — L905 Scar conditions and fibrosis of skin: Secondary | ICD-10-CM | POA: Diagnosis not present

## 2022-05-21 DIAGNOSIS — D224 Melanocytic nevi of scalp and neck: Secondary | ICD-10-CM

## 2022-05-21 DIAGNOSIS — D492 Neoplasm of unspecified behavior of bone, soft tissue, and skin: Secondary | ICD-10-CM

## 2022-05-21 DIAGNOSIS — D2339 Other benign neoplasm of skin of other parts of face: Secondary | ICD-10-CM

## 2022-05-21 DIAGNOSIS — L821 Other seborrheic keratosis: Secondary | ICD-10-CM

## 2022-05-21 DIAGNOSIS — L578 Other skin changes due to chronic exposure to nonionizing radiation: Secondary | ICD-10-CM

## 2022-05-21 DIAGNOSIS — D239 Other benign neoplasm of skin, unspecified: Secondary | ICD-10-CM

## 2022-05-21 DIAGNOSIS — D2362 Other benign neoplasm of skin of left upper limb, including shoulder: Secondary | ICD-10-CM

## 2022-05-21 DIAGNOSIS — D229 Melanocytic nevi, unspecified: Secondary | ICD-10-CM

## 2022-05-21 HISTORY — DX: Other benign neoplasm of skin, unspecified: D23.9

## 2022-05-21 NOTE — Patient Instructions (Addendum)
Seborrheic Keratosis  What causes seborrheic keratoses? Seborrheic keratoses are harmless, common skin growths that first appear during adult life.  As time goes by, more growths appear.  Some people may develop a large number of them.  Seborrheic keratoses appear on both covered and uncovered body parts.  They are not caused by sunlight.  The tendency to develop seborrheic keratoses can be inherited.  They vary in color from skin-colored to gray, brown, or even black.  They can be either smooth or have a rough, warty surface.   Seborrheic keratoses are superficial and look as if they were stuck on the skin.  Under the microscope this type of keratosis looks like layers upon layers of skin.  That is why at times the top layer may seem to fall off, but the rest of the growth remains and re-grows.    Treatment Seborrheic keratoses do not need to be treated, but can easily be removed in the office.  Seborrheic keratoses often cause symptoms when they rub on clothing or jewelry.  Lesions can be in the way of shaving.  If they become inflamed, they can cause itching, soreness, or burning.  Removal of a seborrheic keratosis can be accomplished by freezing, burning, or surgery. If any spot bleeds, scabs, or grows rapidly, please return to have it checked, as these can be an indication of a skin cancer.  On Right temple  A dermatofibroma is a benign growth possibly related to trauma, such as an insect bite or inflamed acne-type bump.  Discussed removal (shave vrs excision) with resulting scar and risk of recurrence.  Since not bothersome, will observe for now. - Left dorsum wrist  Melasma on arms Melasma is a chronic; persistent condition of hyperpigmented patches generally on the face, worse in summer due to higher UV exposure.    Heredity; thyroid disease; sun exposure; pregnancy; birth control pills; epilepsy medication and darker skin may predispose to Melasma.   Recommendations include: - Sun  avoidance and daily broad spectrum (UVA/UVB) sunscreen SPF 30+, preferably with Zinc or Titanium Dioxide. - Rx topical bleaching creams (i.e. hydroquinone) is a common treatment but should not be used long term.  Hydroquinones may be mixed with retinoids; steroids; Kojic Acid. - Rx Azelaic Acid is also a treatment option that is safe for pregnancy (Category B). - OTC Heliocare can be helpful in control and prevention. - Oral Rx with Tranexamic Acid 250 mg - 650 mg po daily can be used for moderate to severe cases especially during summer (contraindications include pregnancy; lactation; hx of PE; DVT; clotting disorder; heart disease; anticoagulant use and upcoming long trips)   - Chemical peels (would need multiple for best result).  - Lasers and  Microdermabrasion may also be helpful adjunct treatments.   Wound Care Instructions  Cleanse wound gently with soap and water once a day then pat dry with clean gauze. Apply a thin coat of Petrolatum (petroleum jelly, "Vaseline") over the wound (unless you have an allergy to this). We recommend that you use a new, sterile tube of Vaseline. Do not pick or remove scabs. Do not remove the yellow or white "healing tissue" from the base of the wound.  Cover the wound with fresh, clean, nonstick gauze and secure with paper tape. You may use Band-Aids in place of gauze and tape if the wound is small enough, but would recommend trimming much of the tape off as there is often too much. Sometimes Band-Aids can irritate the skin.  You should  call the office for your biopsy report after 1 week if you have not already been contacted.  If you experience any problems, such as abnormal amounts of bleeding, swelling, significant bruising, significant pain, or evidence of infection, please call the office immediately.  FOR ADULT SURGERY PATIENTS: If you need something for pain relief you may take 1 extra strength Tylenol (acetaminophen) AND 2 Ibuprofen ('200mg'$  each)  together every 4 hours as needed for pain. (do not take these if you are allergic to them or if you have a reason you should not take them.) Typically, you may only need pain medication for 1 to 3 days.      Due to recent changes in healthcare laws, you may see results of your pathology and/or laboratory studies on MyChart before the doctors have had a chance to review them. We understand that in some cases there may be results that are confusing or concerning to you. Please understand that not all results are received at the same time and often the doctors may need to interpret multiple results in order to provide you with the best plan of care or course of treatment. Therefore, we ask that you please give [place] 2 business days to thoroughly review all your results before contacting the office for clarification. Should we see a critical lab result, you will be contacted sooner.   If You Need Anything After Your Visit  If you have any questions or concerns for your doctor, please call our main line at [phone] and press option 4 to reach your doctor's medical assistant. If no one answers, please leave a voicemail as directed and we will return your call as soon as possible. Messages left after 4 pm will be answered the following business day.   You may also send [place] a message via [place]. We typically respond to MyChart messages within 1-2 business days.  For prescription refills, please ask your pharmacy to contact our office. Our fax number is [phone].  If you have an urgent issue when the clinic is closed that cannot wait until the next business day, you can page your doctor at the number below.    Please note that while we do our best to be available for urgent issues outside of office hours, we are not available 24/7.   If you have an urgent issue and are unable to reach [place], you may choose to seek medical care at your doctor's office, retail clinic, urgent care center, or emergency  room.  If you have a medical emergency, please immediately call 911 or go to the emergency department.  Pager Numbers  - Dr. Nehemiah Massed: [phone]  - Dr. Laurence Ferrari: [phone]  - Dr. Nicole Kindred: [phone]  In the event of inclement weather, please call our main line at [phone] for an update on the status of any delays or closures.  Dermatology Medication Tips: Please keep the boxes that topical medications come in in order to help keep track of the instructions about where and how to use these. Pharmacies typically print the medication instructions only on the boxes and not directly on the medication tubes.   If your medication is too expensive, please contact our office at [phone] option 4 or send [place] a message through [place].   We are unable to tell what your co-pay for medications will be in advance as this is different depending on your insurance coverage. However, we may be able to find a substitute medication at lower cost or fill out paperwork  to get insurance to cover a needed medication.   If a prior authorization is required to get your medication covered by your insurance company, please allow [place] 1-2 business days to complete this process.  Drug prices often vary depending on where the prescription is filled and some pharmacies may offer cheaper prices.  The website www.goodrx.com contains coupons for medications through different pharmacies. The prices here do not account for what the cost may be with help from insurance (it may be cheaper with your insurance), but the website can give you the price if you did not use any insurance.  - You can print the associated coupon and take it with your prescription to the pharmacy.  - You may also stop by our office during regular business hours and pick up a GoodRx coupon card.  - If you need your prescription sent electronically to a different pharmacy, notify our office through [facility] or by phone at [phone]  option 4.     Si Usted Necesita Algo Despus de Su Visita  Tambin puede enviarnos un mensaje a travs de Pharmacist, community. Por lo general respondemos a los mensajes de MyChart en el transcurso de 1 a 2 das hbiles.  Para renovar recetas, por favor pida a su farmacia que se ponga en contacto con nuestra oficina. Harland Dingwall de fax es [place] [phone].  Si tiene un asunto urgente cuando la clnica est cerrada y que no puede esperar hasta el siguiente da hbil, puede llamar/localizar a su doctor(a) al nmero que aparece a continuacin.   Por favor, tenga en cuenta que aunque hacemos todo lo posible para estar disponibles para asuntos urgentes fuera del horario de [place], no estamos disponibles las 24 horas del da, los 7 das de la [place].   Si tiene un problema urgente y no puede comunicarse con nosotros, puede optar por buscar atencin mdica  en el consultorio de su doctor(a), en una clnica privada, en un centro de atencin urgente o en una sala de emergencias.  Si tiene Engineering geologist, por favor llame inmediatamente al 911 o vaya a la sala de emergencias.  Nmeros de bper  - Dr. Nehemiah Massed: [phone]  - Dra. Moye: [phone]  - Dra. Nicole Kindred: [phone]  En caso de inclemencias del [place], por favor llame a Johnsie Kindred principal al [phone] para una actualizacin sobre el [place] de cualquier retraso o cierre.  Consejos para la medicacin en dermatologa: Por favor, guarde las cajas en las que vienen los medicamentos de uso tpico para ayudarle a seguir las instrucciones sobre dnde y cmo usarlos. Las farmacias generalmente imprimen las instrucciones del medicamento slo en las cajas y no directamente en los tubos del [place].   Si su medicamento es muy caro, por favor, pngase en contacto con Zigmund Daniel llamando al [phone] y presione la opcin 4 o envenos un mensaje a travs de Pharmacist, community.   No podemos decirle cul ser su copago por los medicamentos  por adelantado ya que esto es diferente dependiendo de la cobertura de su seguro. Sin embargo, es posible que podamos encontrar un medicamento sustituto a Electrical engineer un formulario para que el seguro cubra el medicamento que se considera necesario.   Si se requiere una autorizacin previa para que su compaa de seguros [place] su medicamento, por favor permtanos de 1 a 2 das hbiles para completar este proceso.  Los precios de los medicamentos varan con frecuencia dependiendo del Environmental consultant de dnde se surte la receta y [place]  pueden ofrecer precios ms baratos.  El sitio web www.goodrx.com tiene cupones para medicamentos de Airline pilot. Los precios aqu no tienen en cuenta lo que podra costar con la ayuda del seguro (puede ser ms barato con su seguro), pero el sitio web puede darle el precio si no utiliz Research scientist (physical sciences).  - Puede imprimir el cupn correspondiente y llevarlo con su receta a la farmacia.  - Tambin puede pasar por nuestra oficina durante el horario de atencin regular y Charity fundraiser una tarjeta de cupones de GoodRx.  - Si necesita que su receta se enve electrnicamente a una farmacia diferente, informe a nuestra oficina a travs de MyChart de [place] o por telfono llamando al [phone] y presione la opcin 4.

## 2022-05-21 NOTE — Progress Notes (Signed)
New Patient Visit  Subjective  Jake Brown is a 56 y.o. male who presents for the following: check spots (R temple hairline, new, no symptoms/Moles scalp, have had for several yrs/New spots, bil arms, no symptoms/), Itchy spot (Back, > 1 yr, itchy), and bumps (Nose, > 1 yr). The patient has spots, moles and lesions to be evaluated, some may be new or changing and the patient has concerns that these could be cancer.  New patient referral from Dr. Olivia Mackie McLean-Scocuzza.  The following portions of the chart were reviewed this encounter and updated as appropriate:   Tobacco  Allergies  Meds  Problems  Med Hx  Surg Hx  Fam Hx      Review of Systems:  No other skin or systemic complaints except as noted in HPI or Assessment and Plan.  Objective  Well appearing patient in no apparent distress; mood and affect are within normal limits.  A focused examination was performed including face, scalp, arms, back. Relevant physical exam findings are noted in the Assessment and Plan.  L lat nasal tip 0.3cm flesh pap     bil arms Reticulated hyperpigmented patches arms  L mid back lat near the side Pink scar     L back on grim reapers leg of tattoo 0.6cm irregular brown macule      Assessment & Plan  Fibrous papule of face L lat nasal tip See photo Benign-appearing.  Observation.  Call clinic for new or changing moles.  Recommend daily use of broad spectrum spf 30+ sunscreen to sun-exposed areas.    Melasma bil arms Melasma is a chronic; persistent condition of hyperpigmented patches generally on the face, worse in summer due to higher UV exposure.    Heredity; thyroid disease; sun exposure; pregnancy; birth control pills; epilepsy medication and darker skin may predispose to Melasma.   Recommendations include: - Sun avoidance and daily broad spectrum (UVA/UVB) sunscreen SPF 30+, preferably with Zinc or Titanium Dioxide. - Rx topical bleaching creams (i.e.  hydroquinone) is a common treatment but should not be used long term.  Hydroquinones may be mixed with retinoids; steroids; Kojic Acid. - Rx Azelaic Acid is also a treatment option that is safe for pregnancy (Category B). - OTC Heliocare can be helpful in control and prevention. - Oral Rx with Tranexamic Acid 250 mg - 650 mg po daily can be used for moderate to severe cases especially during summer (contraindications include pregnancy; lactation; hx of PE; DVT; clotting disorder; heart disease; anticoagulant use and upcoming long trips)   - Chemical peels (would need multiple for best result).  - Lasers and  Microdermabrasion may also be helpful adjunct treatments. Pt declines treatment today.  Scar L mid back lat near the side Vs Dermatofibroma Benign appearing Discussed excising  Neoplasm of skin L back on grim reapers leg of tattoo Epidermal / dermal shaving  Lesion diameter (cm):  0.6 Informed consent: discussed and consent obtained   Timeout: patient name, date of birth, surgical site, and procedure verified   Procedure prep:  Patient was prepped and draped in usual sterile fashion Prep type:  Isopropyl alcohol Anesthesia: the lesion was anesthetized in a standard fashion   Anesthetic:  1% lidocaine w/ epinephrine 1-100,000 buffered w/ 8.4% NaHCO3 Instrument used: flexible razor blade   Hemostasis achieved with: pressure, aluminum chloride and electrodesiccation   Outcome: patient tolerated procedure well   Post-procedure details: sterile dressing applied and wound care instructions given   Dressing type: bandage and bacitracin  Specimen 1 - Surgical pathology Differential Diagnosis: D48.5 Nevus vs Dysplastic Nevus Check Margins: yes 0.6cm irregular brown macule  Family history of skin cancer - what type(s): pt not sure what type - who affected: mother  Seborrheic Keratoses - Stuck-on, waxy, tan-brown papules and/or plaques  - Benign-appearing - Discussed benign  etiology and prognosis. - Observe - Call for any changes - face  Actinic Damage - chronic, secondary to cumulative UV radiation exposure/sun exposure over time - diffuse scaly erythematous macules with underlying dyspigmentation - Recommend daily broad spectrum sunscreen SPF 30+ to sun-exposed areas, reapply every 2 hours as needed.  - Recommend staying in the shade or wearing long sleeves, sun glasses (UVA+UVB protection) and wide brim hats (4-inch brim around the entire circumference of the hat). - Call for new or changing lesions.   Melanocytic Nevi - Tan-brown and/or pink-flesh-colored symmetric macules and papules - Benign appearing on exam today - Observation - Call clinic for new or changing moles - Recommend daily use of broad spectrum spf 30+ sunscreen to sun-exposed areas.   - R crown scalp flesh pap - L temple within hairline mamillated pap  Dermatofibroma - Firm pink/brown papulenodule with dimple sign - Benign appearing - Call for any changes  - L dorsum wrist  Return if symptoms worsen or fail to improve.  I, Othelia Pulling, RMA, am acting as scribe for Sarina Ser, MD . Documentation: I have reviewed the above documentation for accuracy and completeness, and I agree with the above.  Sarina Ser, MD

## 2022-05-22 ENCOUNTER — Ambulatory Visit: Payer: Medicare Other | Admitting: Internal Medicine

## 2022-05-22 NOTE — Progress Notes (Signed)
TeleHealth Visit:  Due to the COVID-19 pandemic, this visit was completed with telemedicine (audio/video) technology to reduce patient and provider exposure as well as to preserve personal protective equipment.   Jake Brown has verbally consented to this TeleHealth visit. The patient is located at home, the provider is located at the [facility] office. The participants in this visit include the listed provider and patient. The visit was conducted today via video visit.  Chief Complaint: OBESITY Jake Brown is here to discuss his progress with his obesity treatment plan along with follow-up of his obesity related diagnoses. Jake Brown is on the [place] and states he is following his eating plan approximately 0% of the time. Jake Brown states he is doing some walking.   Today's visit was #: 3 Starting weight: 291 lbs Starting date: 04/01/2022  Interim History: He has been sick with diarrhea for 2 weeks.  Has been off the meal plan.  He is drinking water, Gatorade zero, chicken broth.  He complains of fatigue due to illness and has not been exercising.   Subjective:   1. Chronic low back pain, unspecified back pain laterality, unspecified whether sciatica present Has L5-S1 Spondylolisthesis and seeing Dr Arnoldo Morale, has plans to have lumbar fusion.  Back pain limits exercise.   2. Gastroenteritis He reports 2 weeks of diarrhea.  He is able to keep down water and Gatorade zero.  Has not had labs done.  Has mild abdominal cramping with no fever.  Denies blood in stool.   3. Vitamin D deficiency He is on prescription Vitamin D 50,000 IU weekly.    4. Pre-diabetes Last A1c 6.0.  Actively working on weight loss and reducing sugar intake.    Assessment/Plan:   1. Chronic low back pain, unspecified back pain laterality, unspecified whether sciatica present Hopes to really increase exercise after back surgery.   2. Gastroenteritis Discussed hydrating, bland diet, rest and follow up with  PCP if diarrhea continues >48 hours.   3. Vitamin D deficiency Recheck Vitamin D level in 2 months.   Refill - Vitamin D, Ergocalciferol, (DRISDOL) 1.25 MG (50000 UNIT) CAPS capsule; Take 1 capsule (50,000 Units total) by mouth every 7 (seven) days.  Dispense: 5 capsule; Refill: 0  4. Pre-diabetes Recheck  A1c in the next 1-2 months.  Continue to limit sweets.   5. Obesity,current BMI 43.94 1) Supportive care for gastroenteritis. 2) Resume meal plan once diarrhea resolves.   Jake Brown is currently in the action stage of change. As such, his goal is to continue with weight loss efforts. He has agreed to the [place].   Exercise goals: All adults should avoid inactivity. Some physical activity is better than none, and adults who participate in any amount of physical activity gain some health benefits.  Behavioral modification strategies: increasing lean protein intake, increasing vegetables, increasing water intake, decreasing eating out, keeping healthy foods in the home, better snacking choices, and decreasing junk food.  Jake Brown has agreed to follow-up with our clinic in 4 weeks. He was informed of the importance of frequent follow-up visits to maximize his success with intensive lifestyle modifications for his multiple health conditions.  Objective:   VITALS: Per patient if applicable, see vitals. GENERAL: Alert and in no acute distress. CARDIOPULMONARY: No increased WOB. Speaking in clear sentences.  PSYCH: Pleasant and cooperative. Speech normal rate and rhythm. Affect is appropriate. Insight and judgement are appropriate. Attention is focused, linear, and appropriate.  NEURO: Oriented as arrived to appointment on time  with no prompting.   Lab Results  Component Value Date   CREATININE 1.54 (H) 04/01/2022   BUN 19 04/01/2022   NA 141 04/01/2022   K 4.4 04/01/2022   CL 101 04/01/2022   CO2 23 04/01/2022   Lab Results  Component Value Date   ALT 15 04/01/2022   AST 14  04/01/2022   ALKPHOS 91 04/01/2022   BILITOT 0.7 04/01/2022   Lab Results  Component Value Date   HGBA1C 6.0 (H) 04/01/2022   HGBA1C 6.2 10/01/2021   HGBA1C 6.0 02/21/2021   HGBA1C 5.8 10/03/2020   HGBA1C 6.0 05/16/2020   Lab Results  Component Value Date   INSULIN 15.3 04/01/2022   Lab Results  Component Value Date   TSH 0.47 10/01/2021   Lab Results  Component Value Date   CHOL 161 10/01/2021   HDL 46.30 10/01/2021   LDLCALC 85 10/01/2021   TRIG 147.0 10/01/2021   CHOLHDL 3 10/01/2021   Lab Results  Component Value Date   VD25OH 22.54 (L) 10/01/2021   VD25OH 33.89 05/16/2020   VD25OH 20.22 (L) 05/05/2019   Lab Results  Component Value Date   WBC 9.5 10/01/2021   HGB 15.0 10/01/2021   HCT 45.1 10/01/2021   MCV 88.9 10/01/2021   PLT 224.0 10/01/2021   No results found for: "IRON", "TIBC", "FERRITIN"  Attestation Statements:   Reviewed by clinician on day of visit: allergies, medications, problem list, medical history, surgical history, family history, social history, and previous encounter notes.  I, Davy Pique, am acting as Location manager for Loyal Gambler, DO.  I have reviewed the above documentation for accuracy and completeness, and I agree with the above. Dell Ponto, DO

## 2022-06-02 ENCOUNTER — Telehealth: Payer: Self-pay

## 2022-06-02 NOTE — Telephone Encounter (Signed)
Discussed biopsy results with pt, pt will call back to make a 1 year f/u appt later.

## 2022-06-02 NOTE — Telephone Encounter (Signed)
-----   Message from Ralene Bathe, MD sent at 05/28/2022  2:04 PM EST ----- Diagnosis Skin , left back on grim reapers leg of tattoo [place], LIMITED MARGINS FREE   Moderate dysplastic Recheck 1 year - make pt appt for 1 year

## 2022-06-04 ENCOUNTER — Encounter: Payer: Self-pay | Admitting: Dermatology

## 2022-06-20 DIAGNOSIS — B351 Tinea unguium: Secondary | ICD-10-CM | POA: Diagnosis not present

## 2022-06-20 DIAGNOSIS — M79675 Pain in left toe(s): Secondary | ICD-10-CM | POA: Diagnosis not present

## 2022-06-20 DIAGNOSIS — M79674 Pain in right toe(s): Secondary | ICD-10-CM | POA: Diagnosis not present

## 2022-07-02 DIAGNOSIS — M5412 Radiculopathy, cervical region: Secondary | ICD-10-CM | POA: Diagnosis not present

## 2022-07-02 DIAGNOSIS — M25561 Pain in right knee: Secondary | ICD-10-CM | POA: Diagnosis not present

## 2022-07-02 DIAGNOSIS — M48062 Spinal stenosis, lumbar region with neurogenic claudication: Secondary | ICD-10-CM | POA: Diagnosis not present

## 2022-07-02 DIAGNOSIS — M5416 Radiculopathy, lumbar region: Secondary | ICD-10-CM | POA: Diagnosis not present

## 2022-08-06 ENCOUNTER — Encounter: Payer: Medicare Other | Admitting: Family Medicine

## 2022-08-07 ENCOUNTER — Encounter: Payer: Self-pay | Admitting: Nurse Practitioner

## 2022-08-07 ENCOUNTER — Ambulatory Visit (INDEPENDENT_AMBULATORY_CARE_PROVIDER_SITE_OTHER): Payer: Medicare Other | Admitting: Nurse Practitioner

## 2022-08-07 ENCOUNTER — Other Ambulatory Visit: Payer: Self-pay | Admitting: Nurse Practitioner

## 2022-08-07 VITALS — BP 146/92 | HR 61 | Temp 98.2°F | Ht 68.0 in | Wt 284.8 lb

## 2022-08-07 DIAGNOSIS — N1831 Chronic kidney disease, stage 3a: Secondary | ICD-10-CM

## 2022-08-07 DIAGNOSIS — R7303 Prediabetes: Secondary | ICD-10-CM

## 2022-08-07 DIAGNOSIS — E785 Hyperlipidemia, unspecified: Secondary | ICD-10-CM

## 2022-08-07 DIAGNOSIS — Z125 Encounter for screening for malignant neoplasm of prostate: Secondary | ICD-10-CM

## 2022-08-07 DIAGNOSIS — I10 Essential (primary) hypertension: Secondary | ICD-10-CM

## 2022-08-07 DIAGNOSIS — Z6841 Body Mass Index (BMI) 40.0 and over, adult: Secondary | ICD-10-CM

## 2022-08-07 DIAGNOSIS — Z1329 Encounter for screening for other suspected endocrine disorder: Secondary | ICD-10-CM

## 2022-08-07 DIAGNOSIS — Z0000 Encounter for general adult medical examination without abnormal findings: Secondary | ICD-10-CM

## 2022-08-07 MED ORDER — LOSARTAN POTASSIUM 50 MG PO TABS: 50.0000 mg | ORAL_TABLET | Freq: Every day | ORAL | 1 refills | Status: DC

## 2022-08-07 NOTE — Patient Instructions (Signed)
It was nice to meet you.   I am stopping your Lisinopril and starting you on Losartan. Please be sure to pick it up from the pharmacy and discard your remaining Lisinopril.   Continue working on [employer]. I have attached some home exercises you can do while sitting.   Please complete your lab work 2-3 days prior to your appointment for your Annual Exam.

## 2022-08-07 NOTE — Assessment & Plan Note (Signed)
Chronic. Stable on Lipitor and Fish Oil. Continue.

## 2022-08-07 NOTE — Assessment & Plan Note (Signed)
Elevated today in office x 2 readings. Reports home readings as 140s/80s. Discontinue Lisinopril 30 mg and start Losartan 50 mg daily. Patient will return in 2 weeks for blood pressure check. Will monitor.

## 2022-08-07 NOTE — Progress Notes (Signed)
Tomasita Morrow, NP-C Phone: [phone]  Jake Brown is a 57 y.o. male who presents today for transfer of care. He has no complaints or new concerns today. He is doing well on all of his medications. He has an appointment next week with Neurosurgery to schedule surgery on his back.   HYPERTENSION Disease Monitoring: Blood pressure range- 140/80  Chest pain- No      Dyspnea- No Medications: Compliance- Norvasc, Bystolic, Lisinopril Lightheadedness- No   Edema- No  PREDIABETES Disease Monitoring: Blood Sugar ranges- Not checking Polyuria/phagia/dipsia- No      Optho- Yes Medications: Compliance- Diet controlled Hypoglycemic symptoms- No  HYPERLIPIDEMIA Disease Monitoring: See symptoms for Hypertension Medications: Compliance- Lipitor and Fish oil Right upper quadrant pain- No  Muscle aches- No  Lab Results  Component Value Date   CHOL 161 10/01/2021   HDL 46.30 10/01/2021   LDLCALC 85 10/01/2021   TRIG 147.0 10/01/2021   CHOLHDL 3 10/01/2021   Lab Results  Component Value Date   NA 141 04/01/2022   K 4.4 04/01/2022   CO2 23 04/01/2022   GLUCOSE 80 04/01/2022   BUN 19 04/01/2022   CREATININE 1.54 (H) 04/01/2022   CALCIUM 9.4 04/01/2022   EGFR 52 (L) 04/01/2022   GFRNONAA 54 (L) 08/13/2018   Lab Results  Component Value Date   HGBA1C 6.0 (H) 04/01/2022    Social History   Tobacco Use  Smoking Status Former   Packs/day: 1.00   Years: 20.00   Total pack years: 20.00   Types: Cigarettes   Quit date: 07/21/1993   Years since quitting: 29.0  Smokeless Tobacco Never    Current Outpatient Medications on File Prior to Visit  Medication Sig Dispense Refill   amLODipine (NORVASC) 10 MG tablet Take 1 tablet (10 mg total) by mouth daily. Qd d/c 5 mg qd 90 tablet 3   aspirin EC 81 MG tablet Take 1 tablet (81 mg total) by mouth daily. 90 tablet 3   atorvastatin (LIPITOR) 40 MG tablet TAKE ONE TABLET BY MOUTH DAILY AT 6 P.M. 90 tablet 3   baclofen (LIORESAL) 10 MG  tablet Take by mouth.     buPROPion (WELLBUTRIN XL) 150 MG 24 hr tablet Take 1 tablet (150 mg total) by mouth daily. Dose reduction 90 tablet 0   clopidogrel (PLAVIX) 75 MG tablet clopidogrel 75 mg tablet 90 tablet 3   cyanocobalamin 1000 MCG tablet Take by mouth.     fentaNYL (DURAGESIC) 12 MCG/HR 12 patches every 3 (three) days.     Nebivolol HCl 20 MG TABS TAKE 1 TABLET BY MOUTH DAILY ** DISCONTINUE CARVEDILOL '25MG'$ ** 90 tablet 3   Omega-3 Fatty Acids (RA FISH OIL) 1000 MG CAPS Take by mouth.     ondansetron (ZOFRAN) 4 MG tablet Take 4 mg by mouth every 8 (eight) hours as needed for nausea or vomiting.     oxyCODONE-acetaminophen (PERCOCET) 10-325 MG tablet Take 1 tablet by mouth every 4 (four) hours as needed for pain (Q4-6 hours prn). 30 tablet    sertraline (ZOLOFT) 100 MG tablet Take 1 tablet (100 mg total) by mouth daily. 90 tablet 3   Vitamin D, Ergocalciferol, (DRISDOL) 1.25 MG (50000 UNIT) CAPS capsule Take 1 capsule (50,000 Units total) by mouth every 7 (seven) days. 5 capsule 0   Galcanezumab-gnlm (EMGALITY) 120 MG/ML SOAJ Inject into the skin every 28 (twenty-eight) days. Dr. Manuella Ghazi     No current facility-administered medications on file prior to visit.     ROS  see history of present illness  Objective  Physical Exam Vitals:   08/07/22 1309 08/07/22 1335  BP: (!) 160/98 (!) 146/92  Pulse: 61   Temp: 98.2 F (36.8 C)   SpO2: 95%     BP Readings from Last 3 Encounters:  08/07/22 (!) 146/92  04/29/22 (!) 148/84  04/15/22 (!) 155/92   Wt Readings from Last 3 Encounters:  08/07/22 284 lb 12.8 oz (129.2 kg)  04/29/22 286 lb 12.8 oz (130.1 kg)  04/15/22 289 lb (131.1 kg)    Physical Exam Constitutional:      General: He is not in acute distress.    Appearance: Normal appearance. He is obese.  HENT:     Head: Normocephalic.  Cardiovascular:     Rate and Rhythm: Normal rate and regular rhythm.     Heart sounds: Normal heart sounds.  Pulmonary:     Effort:  Pulmonary effort is normal.     Breath sounds: Normal breath sounds.  Skin:    General: Skin is warm and dry.  Neurological:     Mental Status: He is alert.  Psychiatric:        Mood and Affect: Mood normal.        Behavior: Behavior normal.    Assessment/Plan: Please see individual problem list.  Primary hypertension Assessment & Plan: Elevated today in office x 2 readings. Reports home readings as 140s/80s. Discontinue Lisinopril 30 mg and start Losartan 50 mg daily. Patient will return in 2 weeks for blood pressure check. Will monitor.   Orders: -     Losartan Potassium; Take 1 tablet (50 mg total) by mouth daily.  Dispense: 90 tablet; Refill: 1  Prediabetes Assessment & Plan: Stable with diet control. A1c- 6.0. Continue.    Hyperlipidemia, unspecified hyperlipidemia type Assessment & Plan: Chronic. Stable on Lipitor and Fish Oil. Continue.    Morbid obesity with BMI of 40.0-44.9, adult [facility]) Assessment & Plan: Patient is limited in exercise ability due to back problems. He is seeing Neurosurgery to hopefully schedule surgery. Encouraged healthy diet and provided patient with exercises he can do at home while sitting.     Return in about 3 months (around 11/06/2022) for Annual Exam. Please complete labs 2-3 days prior.   Tomasita Morrow, NP-C [place]

## 2022-08-07 NOTE — Assessment & Plan Note (Signed)
Stable with diet control. A1c- 6.0. Continue.

## 2022-08-07 NOTE — Assessment & Plan Note (Signed)
Patient is limited in exercise ability due to back problems. He is seeing Neurosurgery to hopefully schedule surgery. Encouraged healthy diet and provided patient with exercises he can do at home while sitting.

## 2022-08-12 DIAGNOSIS — M5136 Other intervertebral disc degeneration, lumbar region: Secondary | ICD-10-CM | POA: Diagnosis not present

## 2022-08-21 ENCOUNTER — Ambulatory Visit: Payer: Medicare Other

## 2022-08-28 ENCOUNTER — Ambulatory Visit (INDEPENDENT_AMBULATORY_CARE_PROVIDER_SITE_OTHER): Payer: Medicare Other

## 2022-08-28 VITALS — Ht 68.0 in | Wt 284.0 lb

## 2022-08-28 DIAGNOSIS — Z0000 Encounter for general adult medical examination without abnormal findings: Secondary | ICD-10-CM

## 2022-08-28 DIAGNOSIS — Z599 Problem related to housing and economic circumstances, unspecified: Secondary | ICD-10-CM | POA: Diagnosis not present
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
## 2022-08-28 NOTE — Patient Instructions (Signed)
[name] , Thank you for taking time to come for your Medicare Wellness Visit. I appreciate your ongoing commitment to your health goals. Please review the following plan we discussed and let me know if I can assist you in the future.   These are the goals we discussed:  Goals       Patient Stated (pt-stated)      Start going back to [employer].        This is a list of the screening recommended for you and due dates:  Health Maintenance  Topic Date Due   COVID-19 Vaccine (7 - 2023-24 season) 03/21/2022   Flu Shot  10/19/2022*   Yearly kidney health urinalysis for diabetes  04/30/2027*   Hemoglobin A1C  09/30/2022   Yearly kidney function blood test for diabetes  04/02/2023   Medicare Annual Wellness Visit  08/29/2023   DTaP/Tdap/Td vaccine (2 - Td or Tdap) 08/20/2028   Colon Cancer Screening  03/06/2032   Hepatitis C Screening: USPSTF Recommendation to screen - Ages 80-79 yo.  Completed   HIV Screening  Completed   HPV Vaccine  Aged Out   Complete foot exam   Discontinued   Eye exam for diabetics  Discontinued   Zoster (Shingles) Vaccine  Discontinued  *Topic was postponed. The date shown is not the original due date.   Opioid Pain Medicine Management Opioids are powerful medicines that are used to treat moderate to severe pain. When used for short periods of time, they can help you to: Sleep better. Do better in physical or occupational therapy. Feel better in the first few days after an injury. Recover from surgery. Opioids should be taken with the supervision of a trained health care provider. They should be taken for the shortest period of time possible. This is because opioids can be addictive, and the longer you take opioids, the greater your risk of addiction. This addiction can also be called opioid use disorder. What are the risks? Using opioid pain medicines for longer than 3 days increases your risk of side effects. Side effects  include: Constipation. Nausea and vomiting. Breathing difficulties (respiratory depression). Drowsiness. Confusion. Opioid use disorder. Itching. Taking opioid pain medicine for a long period of time can affect your ability to do daily tasks. It also puts you at risk for: Motor vehicle crashes. Depression. Suicide. Heart attack. Overdose, which can be life-threatening. What is a pain treatment plan? A pain treatment plan is an agreement between you and your health care provider. Pain is unique to each person, and treatments vary depending on your condition. To manage your pain, you and your health care provider need to work together. To help you do this: Discuss the goals of your treatment, including how much pain you might expect to have and how you will manage the pain. Review the risks and benefits of taking opioid medicines. Remember that a good treatment plan uses more than one approach and minimizes the chance of side effects. Be honest about the amount of medicines you take and about any drug or alcohol use. Get pain medicine prescriptions from only one health care provider. Pain can be managed with many types of alternative treatments. Ask your health care provider to refer you to one or more specialists who can help you manage pain through: Physical or occupational therapy. Counseling (cognitive behavioral therapy). Good nutrition. Biofeedback. Massage. Meditation. Non-opioid medicine. Following a gentle exercise program. How to use opioid pain medicine Taking medicine Take your pain medicine exactly  as told by your health care provider. Take it only when you need it. If your pain gets less severe, you may take less than your prescribed dose if your health care provider approves. If you are not having pain, do nottake pain medicine unless your health care provider tells you to take it. If your pain is severe, do nottry to treat it yourself by taking more pills than  instructed on your prescription. Contact your health care provider for help. Write down the times when you take your pain medicine. It is easy to become confused while on pain medicine. Writing the time can help you avoid overdose. Take other over-the-counter or prescription medicines only as told by your health care provider. Keeping yourself and others safe  While you are taking opioid pain medicine: Do not drive, use machinery, or power tools. Do not sign legal documents. Do not drink alcohol. Do not take sleeping pills. Do not supervise children by yourself. Do not do activities that require climbing or being in high places. Do not go to a lake, river, ocean, spa, or swimming pool. Do not share your pain medicine with anyone. Keep pain medicine in a locked cabinet or in a secure area where pets and children cannot reach it. Stopping your use of opioids If you have been taking opioid medicine for more than a few weeks, you may need to slowly decrease (taper) how much you take until you stop completely. Tapering your use of opioids can decrease your risk of symptoms of withdrawal, such as: Pain and cramping in the abdomen. Nausea. Sweating. Sleepiness. Restlessness. Uncontrollable shaking (tremors). Cravings for the medicine. Do not attempt to taper your use of opioids on your own. Talk with your health care provider about how to do this. Your health care provider may prescribe a step-down schedule based on how much medicine you are taking and how long you have been taking it. Getting rid of leftover pills Do not save any leftover pills. Get rid of leftover pills safely by: Taking the medicine to a prescription take-back program. This is usually offered by the county or law enforcement. Bringing them to a pharmacy that has a drug disposal container. Flushing them down the toilet. Check the label or package insert of your medicine to see whether this is safe to do. Throwing them out in  the trash. Check the label or package insert of your medicine to see whether this is safe to do. If it is safe to throw it out, remove the medicine from the original container, put it into a sealable bag or container, and mix it with used coffee grounds, food scraps, dirt, or cat litter before putting it in the trash. Follow these instructions at home: Activity Do exercises as told by your health care provider. Avoid activities that make your pain worse. Return to your normal activities as told by your health care provider. Ask your health care provider what activities are safe for you. General instructions You may need to take these actions to prevent or treat constipation: Drink enough fluid to keep your urine pale yellow. Take over-the-counter or prescription medicines. Eat foods that are high in fiber, such as beans, whole grains, and fresh fruits and vegetables. Limit foods that are high in fat and processed sugars, such as fried or sweet foods. Keep all follow-up visits. This is important. Where to find support If you have been taking opioids for a long time, you may benefit from receiving support for quitting from  a local support group or counselor. Ask your health care provider for a referral to these resources in your area. Where to find more information Centers for Disease Control and Prevention (CDC): http://www.wolf.info/ U.[place]. Food and Drug Administration (FDA): [place].[employer] Get help right away if: You may have taken too much of an opioid (overdosed). Common symptoms of an overdose: Your breathing is slower or more shallow than normal. You have a very slow heartbeat (pulse). You have slurred speech. You have nausea and vomiting. Your pupils become very small. You have other potential symptoms: You are very confused. You faint or feel like you will faint. You have cold, clammy skin. You have blue lips or fingernails. You have thoughts of harming yourself or harming others. These  symptoms may represent a serious problem that is an emergency. Do not wait to see if the symptoms will go away. Get medical help right away. Call your local emergency services (911 in the [place] Do not drive yourself to the hospital.  If you ever feel like you may hurt yourself or others, or have thoughts about taking your own life, get help right away. Go to your nearest emergency department or: Call your local emergency services (911 in the [place] Call the [facility] (508) 045-7926 in the [place] Call a suicide crisis helpline, such as the [place] at 825-001-3251 or 988 in the [place]. This is open 24 hours a day in the [place] Text the Crisis Text Line at 2023415458 (in the [place].). Summary Opioid medicines can help you manage moderate to severe pain for a short period of time. A pain treatment plan is an agreement between you and your health care provider. Discuss the goals of your treatment, including how much pain you might expect to have and how you will manage the pain. If you think that you or someone else may have taken too much of an opioid, get medical help right away. This information is not intended to replace advice given to you by your health care provider. Make sure you discuss any questions you have with your health care provider. Document Revised: 01/30/2021 Document Reviewed: 10/17/2020 Elsevier Patient Education  [place] directives: no  Conditions/risks identified: Sedentary lifestyle, Social determinant of health insecurities.  Next appointment: Follow up in one year for your annual wellness visit 08/31/2023 @ 1:00 via telephone.  Preventive Care 40-64 Years, Male Preventive care refers to lifestyle choices and visits with your health care provider that can promote health and wellness. What does preventive care include? A yearly physical exam. This is also called an annual well check. Dental exams once or twice  a year. Routine eye exams. Ask your health care provider how often you should have your eyes checked. Personal lifestyle choices, including: Daily care of your teeth and gums. Regular physical activity. Eating a healthy diet. Avoiding tobacco and drug use. Limiting alcohol use. Practicing safe sex. Taking low-dose aspirin every day starting at age 89. What happens during an annual well check? The services and screenings done by your health care provider during your annual well check will depend on your age, overall health, lifestyle risk factors, and family history of disease. Counseling  Your health care provider may ask you questions about your: Alcohol use. Tobacco use. Drug use. Emotional well-being. Home and relationship well-being. Sexual activity. Eating habits. Work and work Statistician. Screening  You may have the following tests or measurements: Height, weight, and BMI. Blood pressure. Lipid and cholesterol  levels. These may be checked every 5 years, or more frequently if you are over 26 years old. Skin check. Lung cancer screening. You may have this screening every year starting at age 52 if you have a 30-pack-year history of smoking and currently smoke or have quit within the past 15 years. Fecal occult blood test (FOBT) of the stool. You may have this test every year starting at age 41. Flexible sigmoidoscopy or colonoscopy. You may have a sigmoidoscopy every 5 years or a colonoscopy every 10 years starting at age 71. Prostate cancer screening. Recommendations will vary depending on your family history and other risks. Hepatitis C blood test. Hepatitis B blood test. Sexually transmitted disease (STD) testing. Diabetes screening. This is done by checking your blood sugar (glucose) after you have not eaten for a while (fasting). You may have this done every 1-3 years. Discuss your test results, treatment options, and if necessary, the need for more tests with your  health care provider. Vaccines  Your health care provider may recommend certain vaccines, such as: Influenza vaccine. This is recommended every year. Tetanus, diphtheria, and acellular pertussis (Tdap, Td) vaccine. You may need a Td booster every 10 years. Zoster vaccine. You may need this after age 31. Pneumococcal 13-valent conjugate (PCV13) vaccine. You may need this if you have certain conditions and have not been vaccinated. Pneumococcal polysaccharide (PPSV23) vaccine. You may need one or two doses if you smoke cigarettes or if you have certain conditions. Talk to your health care provider about which screenings and vaccines you need and how often you need them. This information is not intended to replace advice given to you by your health care provider. Make sure you discuss any questions you have with your health care provider. Document Released: 08/03/2015 Document Revised: 03/26/2016 Document Reviewed: 05/08/2015 Elsevier Interactive Patient Education  2017 [place] Prevention in the Home Falls can cause injuries. They can happen to people of all ages. There are many things you can do to make your home safe and to help prevent falls. What can I do on the outside of my home? Regularly fix the edges of walkways and driveways and fix any cracks. Remove anything that might make you trip as you walk through a door, such as a raised step or threshold. Trim any bushes or trees on the path to your home. Use bright outdoor lighting. Clear any walking paths of anything that might make someone trip, such as rocks or tools. Regularly check to see if handrails are loose or broken. Make sure that both sides of any steps have handrails. Any raised decks and porches should have guardrails on the edges. Have any leaves, snow, or ice cleared regularly. Use sand or salt on walking paths during winter. Clean up any spills in your garage right away. This includes oil or grease spills. What  can I do in the bathroom? Use night lights. Install grab bars by the toilet and in the tub and shower. Do not use towel bars as grab bars. Use non-skid mats or decals in the tub or shower. If you need to sit down in the shower, use a plastic, non-slip stool. Keep the floor dry. Clean up any water that spills on the floor as soon as it happens. Remove soap buildup in the tub or shower regularly. Attach bath mats securely with double-sided non-slip rug tape. Do not have throw rugs and other things on the floor that can make you trip. What can I  do in the bedroom? Use night lights. Make sure that you have a light by your bed that is easy to reach. Do not use any sheets or blankets that are too big for your bed. They should not hang down onto the floor. Have a firm chair that has side arms. You can use this for support while you get dressed. Do not have throw rugs and other things on the floor that can make you trip. What can I do in the kitchen? Clean up any spills right away. Avoid walking on wet floors. Keep items that you use a lot in easy-to-reach places. If you need to reach something above you, use a strong step stool that has a grab bar. Keep electrical cords out of the way. Do not use floor polish or wax that makes floors slippery. If you must use wax, use non-skid floor wax. Do not have throw rugs and other things on the floor that can make you trip. What can I do with my stairs? Do not leave any items on the stairs. Make sure that there are handrails on both sides of the stairs and use them. Fix handrails that are broken or loose. Make sure that handrails are as long as the stairways. Check any carpeting to make sure that it is firmly attached to the stairs. Fix any carpet that is loose or worn. Avoid having throw rugs at the top or bottom of the stairs. If you do have throw rugs, attach them to the floor with carpet tape. Make sure that you have a light switch at the top of the  stairs and the bottom of the stairs. If you do not have them, ask someone to add them for you. What else can I do to help prevent falls? Wear shoes that: Do not have high heels. Have rubber bottoms. Are comfortable and fit you well. Are closed at the toe. Do not wear sandals. If you use a stepladder: Make sure that it is fully opened. Do not climb a closed stepladder. Make sure that both sides of the stepladder are locked into place. Ask someone to hold it for you, if possible. Clearly mark and make sure that you can see: Any grab bars or handrails. First and last steps. Where the edge of each step is. Use tools that help you move around (mobility aids) if they are needed. These include: Canes. Walkers. Scooters. Crutches. Turn on the lights when you go into a dark area. Replace any light bulbs as soon as they burn out. Set up your furniture so you have a clear path. Avoid moving your furniture around. If any of your floors are uneven, fix them. If there are any pets around you, be aware of where they are. Review your medicines with your doctor. Some medicines can make you feel dizzy. This can increase your chance of falling. Ask your doctor what other things that you can do to help prevent falls. This information is not intended to replace advice given to you by your health care provider. Make sure you discuss any questions you have with your health care provider. Document Released: 05/03/2009 Document Revised: 12/13/2015 Document Reviewed: 08/11/2014 Elsevier Interactive Patient Education  2017 [employer].

## 2022-08-29 ENCOUNTER — Telehealth: Payer: Self-pay

## 2022-08-29 NOTE — Telephone Encounter (Signed)
   Telephone encounter was:  Unsuccessful.  08/29/2022 Name: Jake Brown MRN: [id number] DOB: January 30, 1966  Unsuccessful outbound call made today to assist with:  Transportation Needs   Outreach Attempt:  1st Attempt  A HIPAA compliant voice message was left requesting a return call.  Instructed patient to call back at [phone].  [place] Resource Care Guide   ??[name]  ?? [phone]   Website: [email or web address]  [email or web address]

## 2022-08-29 NOTE — Telephone Encounter (Signed)
   Telephone encounter was:  Unsuccessful.  08/29/2022 Name: JUNIOR HUEZO MRN: [id number] DOB: 05-18-1966  Unsuccessful outbound call made today to assist with:  Transportation Needs   Outreach Attempt:  2nd Attempt  A HIPAA compliant voice message was left requesting a return call.  Instructed patient to call back at [phone].  [place] [place] Resource Care Guide   ??[email or web address]  ?? [phone]   Website: [email or web address]  [email or web address]

## 2022-08-29 NOTE — Telephone Encounter (Signed)
   Telephone encounter was:  Successful.  08/29/2022 Name: Jake Brown MRN: [id number] DOB: [birth year]  Jake Brown is a 57 y.o. year old male who is a primary care patient of Tomasita Morrow, NP . The community resource team was consulted for assistance with Home Modifications  Care guide performed the following interventions: Spoke with patient, he stated he needs bars installed in his shower.  Patient gave consent to send referral to [facility].  He stated he has not other needs at this time.  Follow Up Plan:  No further follow up planned at this time. The patient has been provided with needed resources.  [place] Resource Care Guide   ??[email or web address]  ?? [phone]   Website: [email or web address]  [email or web address]

## 2022-09-01 ENCOUNTER — Encounter: Payer: Self-pay | Admitting: Nurse Practitioner

## 2022-09-01 ENCOUNTER — Ambulatory Visit: Payer: Medicare Other

## 2022-09-01 DIAGNOSIS — M48062 Spinal stenosis, lumbar region with neurogenic claudication: Secondary | ICD-10-CM | POA: Diagnosis not present

## 2022-09-01 DIAGNOSIS — M5416 Radiculopathy, lumbar region: Secondary | ICD-10-CM | POA: Diagnosis not present

## 2022-09-01 DIAGNOSIS — M25561 Pain in right knee: Secondary | ICD-10-CM | POA: Diagnosis not present

## 2022-09-01 DIAGNOSIS — M5412 Radiculopathy, cervical region: Secondary | ICD-10-CM | POA: Diagnosis not present

## 2022-09-29 DIAGNOSIS — M48062 Spinal stenosis, lumbar region with neurogenic claudication: Secondary | ICD-10-CM | POA: Diagnosis not present

## 2022-09-29 DIAGNOSIS — M5416 Radiculopathy, lumbar region: Secondary | ICD-10-CM | POA: Diagnosis not present

## 2022-09-29 DIAGNOSIS — M5412 Radiculopathy, cervical region: Secondary | ICD-10-CM | POA: Diagnosis not present

## 2022-09-29 DIAGNOSIS — M25561 Pain in right knee: Secondary | ICD-10-CM | POA: Diagnosis not present

## 2022-10-08 DIAGNOSIS — M79674 Pain in right toe(s): Secondary | ICD-10-CM | POA: Diagnosis not present

## 2022-10-08 DIAGNOSIS — B351 Tinea unguium: Secondary | ICD-10-CM | POA: Diagnosis not present

## 2022-10-08 DIAGNOSIS — L600 Ingrowing nail: Secondary | ICD-10-CM | POA: Diagnosis not present

## 2022-10-08 DIAGNOSIS — S9032XA Contusion of left foot, initial encounter: Secondary | ICD-10-CM | POA: Diagnosis not present

## 2022-10-27 DIAGNOSIS — M25561 Pain in right knee: Secondary | ICD-10-CM | POA: Diagnosis not present

## 2022-10-27 DIAGNOSIS — M5412 Radiculopathy, cervical region: Secondary | ICD-10-CM | POA: Diagnosis not present

## 2022-10-27 DIAGNOSIS — M48062 Spinal stenosis, lumbar region with neurogenic claudication: Secondary | ICD-10-CM | POA: Diagnosis not present

## 2022-10-27 DIAGNOSIS — M5416 Radiculopathy, lumbar region: Secondary | ICD-10-CM | POA: Diagnosis not present

## 2022-10-28 DIAGNOSIS — M4317 Spondylolisthesis, lumbosacral region: Secondary | ICD-10-CM | POA: Diagnosis not present

## 2022-11-03 ENCOUNTER — Other Ambulatory Visit: Payer: Self-pay | Admitting: Neurosurgery

## 2022-11-04 ENCOUNTER — Other Ambulatory Visit: Payer: Self-pay | Admitting: Neurosurgery

## 2022-11-10 ENCOUNTER — Other Ambulatory Visit (INDEPENDENT_AMBULATORY_CARE_PROVIDER_SITE_OTHER): Payer: Medicare Other

## 2022-11-10 DIAGNOSIS — E785 Hyperlipidemia, unspecified: Secondary | ICD-10-CM

## 2022-11-10 DIAGNOSIS — N1831 Chronic kidney disease, stage 3a: Secondary | ICD-10-CM

## 2022-11-10 DIAGNOSIS — Z125 Encounter for screening for malignant neoplasm of prostate: Secondary | ICD-10-CM | POA: Diagnosis not present

## 2022-11-10 DIAGNOSIS — Z1329 Encounter for screening for other suspected endocrine disorder: Secondary | ICD-10-CM | POA: Diagnosis not present

## 2022-11-10 DIAGNOSIS — Z0000 Encounter for general adult medical examination without abnormal findings: Secondary | ICD-10-CM

## 2022-11-10 DIAGNOSIS — R7303 Prediabetes: Secondary | ICD-10-CM

## 2022-11-10 LAB — COMPREHENSIVE METABOLIC PANEL
ALT: 12 U/L (ref 0–53)
AST: 14 U/L (ref 0–37)
Albumin: 4.1 g/dL (ref 3.5–5.2)
Alkaline Phosphatase: 72 U/L (ref 39–117)
BUN: 16 mg/dL (ref 6–23)
CO2: 31 mEq/L (ref 19–32)
Calcium: 9.2 mg/dL (ref 8.4–10.5)
Chloride: 99 mEq/L (ref 96–112)
Creatinine, Ser: 1.35 mg/dL (ref 0.40–1.50)
GFR: 57.64 mL/min — ABNORMAL LOW (ref >60.00)
Glucose, Bld: 95 mg/dL (ref 70–99)
Potassium: 3.8 mEq/L (ref 3.5–5.1)
Sodium: 137 mEq/L (ref 135–145)
Total Bilirubin: 0.7 mg/dL (ref 0.2–1.2)
Total Protein: 6.6 g/dL (ref 6.0–8.3)

## 2022-11-10 LAB — LIPID PANEL
Cholesterol: 161 mg/dL (ref 0–200)
HDL: 42.1 mg/dL (ref >39.00)
LDL Cholesterol: 88 mg/dL (ref 0–99)
NonHDL: 118.49
Total CHOL/HDL Ratio: 4
Triglycerides: 151 mg/dL — ABNORMAL HIGH (ref 0.0–149.0)
VLDL: 30.2 mg/dL (ref 0.0–40.0)

## 2022-11-10 LAB — PSA: PSA: 0.39 ng/mL (ref 0.10–4.00)

## 2022-11-10 LAB — CBC WITH DIFFERENTIAL/PLATELET
Basophils Absolute: 0.1 10*3/uL (ref 0.0–0.1)
Basophils Relative: 0.8 % (ref 0.0–3.0)
Eosinophils Absolute: 0.2 10*3/uL (ref 0.0–0.7)
Eosinophils Relative: 3.2 % (ref 0.0–5.0)
HCT: 47.9 % (ref 39.0–52.0)
Hemoglobin: 16 g/dL (ref 13.0–17.0)
Lymphocytes Relative: 23 % (ref 12.0–46.0)
Lymphs Abs: 1.8 10*3/uL (ref 0.7–4.0)
MCHC: 33.4 g/dL (ref 30.0–36.0)
MCV: 90.1 fl (ref 78.0–100.0)
Monocytes Absolute: 0.4 10*3/uL (ref 0.1–1.0)
Monocytes Relative: 5.3 % (ref 3.0–12.0)
Neutro Abs: 5.2 10*3/uL (ref 1.4–7.7)
Neutrophils Relative %: 67.7 % (ref 43.0–77.0)
Platelets: 216 10*3/uL (ref 150.0–400.0)
RBC: 5.31 Mil/uL (ref 4.22–5.81)
RDW: 13.9 % (ref 11.5–15.5)
WBC: 7.7 10*3/uL (ref 4.0–10.5)

## 2022-11-10 LAB — TSH: TSH: 0.6 u[IU]/mL (ref 0.35–5.50)

## 2022-11-10 LAB — HEMOGLOBIN A1C: Hgb A1c MFr Bld: 5.8 % (ref 4.6–6.5)

## 2022-11-13 ENCOUNTER — Encounter: Payer: Self-pay | Admitting: Nurse Practitioner

## 2022-11-13 ENCOUNTER — Ambulatory Visit (INDEPENDENT_AMBULATORY_CARE_PROVIDER_SITE_OTHER): Payer: Medicare Other | Admitting: Nurse Practitioner

## 2022-11-13 VITALS — BP 144/76 | HR 102 | Temp 98.3°F | Ht 68.0 in | Wt 269.0 lb

## 2022-11-13 DIAGNOSIS — N1831 Chronic kidney disease, stage 3a: Secondary | ICD-10-CM

## 2022-11-13 DIAGNOSIS — I10 Essential (primary) hypertension: Secondary | ICD-10-CM | POA: Diagnosis not present

## 2022-11-13 DIAGNOSIS — Z0000 Encounter for general adult medical examination without abnormal findings: Secondary | ICD-10-CM

## 2022-11-13 NOTE — Assessment & Plan Note (Signed)
Physical exam complete. Lab work reviewed with patient- stable, no changes needed at this time. Colonoscopy- UTD. PSA- UTD. Flu vaccine not due. COVID and Tetanus vaccines- UTD. Shingles vaccine due, patient interested but would like to return to get this or get at his local pharmacy. Recommended establishing with Dentist and following up with Ophthalmology for annual exams. Encouraged to continue healthy diet.

## 2022-11-13 NOTE — Progress Notes (Signed)
Bethanie Dicker, NP-C Phone: [phone]  Jake Brown is a 57 y.o. male who presents today for annual exam. He completed lab work and would like to review the results. He has no new complaints or concerns today. He is doing well on all of his medications. He has back surgery coming up in May.   Diet: Has been trying to lose weight, no bread, low sugar, smaller portion sizes, staying away from processed foods.  Exercise: None Colonoscopy: 03/06/2022- 7 year recall Prostate cancer screening: 11/10/2022 Family history-  Prostate cancer: No  Colon cancer: No Sexually active: Yes Vaccines-   Flu: Not due  Tetanus: 08/20/2018  Shingles: Interested, does not want today  COVID19: x 5 HIV screening: Negative Hep C Screening: Negative Tobacco use: No Alcohol use: No Illicit Drug use: No Dentist: No Ophthalmology: Yes  HYPERTENSION Disease Monitoring Home BP Monitoring 140s/70s Chest pain- No    Dyspnea- No Medications Compliance-  Losartan. Lightheadedness-  No  Edema- No BMET    Component Value Date/Time   NA 137 11/10/2022 0734   NA 141 04/01/2022 1013   K 3.8 11/10/2022 0734   CL 99 11/10/2022 0734   CO2 31 11/10/2022 0734   GLUCOSE 95 11/10/2022 0734   BUN 16 11/10/2022 0734   BUN 19 04/01/2022 1013   CREATININE 1.35 11/10/2022 0734   CREATININE 1.45 (H) 08/13/2018 0802   CREATININE 1.45 (H) 08/13/2018 0802   CALCIUM 9.2 11/10/2022 0734   GFRNONAA 54 (L) 08/13/2018 0802   GFRAA 63 08/13/2018 0802    Social History   Tobacco Use  Smoking Status Former   Packs/day: 1.00   Years: 20.00   Additional pack years: 0.00   Total pack years: 20.00   Types: Cigarettes   Quit date: 07/21/1993   Years since quitting: 29.3  Smokeless Tobacco Former    Current Outpatient Medications on File Prior to Visit  Medication Sig Dispense Refill   amLODipine (NORVASC) 10 MG tablet Take 1 tablet (10 mg total) by mouth daily. Qd d/c 5 mg qd 90 tablet 3   aspirin EC 81 MG  tablet Take 1 tablet (81 mg total) by mouth daily. 90 tablet 3   atorvastatin (LIPITOR) 40 MG tablet TAKE ONE TABLET BY MOUTH DAILY AT 6 P.M. 90 tablet 3   baclofen (LIORESAL) 10 MG tablet Take by mouth.     buPROPion (WELLBUTRIN XL) 150 MG 24 hr tablet Take 1 tablet (150 mg total) by mouth daily. Dose reduction 90 tablet 0   clopidogrel (PLAVIX) 75 MG tablet clopidogrel 75 mg tablet 90 tablet 3   cyanocobalamin 1000 MCG tablet Take by mouth.     fentaNYL (DURAGESIC) 12 MCG/HR 12 patches every 3 (three) days.     losartan (COZAAR) 50 MG tablet Take 1 tablet (50 mg total) by mouth daily. 90 tablet 1   Nebivolol HCl 20 MG TABS TAKE 1 TABLET BY MOUTH DAILY ** DISCONTINUE CARVEDILOL 25MG ** 90 tablet 3   oxyCODONE-acetaminophen (PERCOCET) 10-325 MG tablet Take 1 tablet by mouth every 4 (four) hours as needed for pain (Q4-6 hours prn). 30 tablet    sertraline (ZOLOFT) 100 MG tablet Take 1 tablet (100 mg total) by mouth daily. 90 tablet 3   Vitamin D, Ergocalciferol, (DRISDOL) 1.25 MG (50000 UNIT) CAPS capsule Take 1 capsule (50,000 Units total) by mouth every 7 (seven) days. 5 capsule 0   No current facility-administered medications on file prior to visit.    ROS see history of present illness  Objective  Physical Exam Vitals:   11/13/22 0828 11/13/22 0832  BP: (!) 160/88 (!) 144/76  Pulse: (!) 102   Temp: 98.3 F (36.8 C)   SpO2: 95%     BP Readings from Last 3 Encounters:  11/13/22 (!) 144/76  08/07/22 (!) 146/92  04/29/22 (!) 148/84   Wt Readings from Last 3 Encounters:  11/13/22 269 lb (122 kg)  08/28/22 284 lb (128.8 kg)  08/07/22 284 lb 12.8 oz (129.2 kg)    Physical Exam Constitutional:      General: He is not in acute distress.    Appearance: Normal appearance.  HENT:     Head: Normocephalic.     Right Ear: Tympanic membrane normal.     Left Ear: Tympanic membrane normal.     Nose: Nose normal.     Mouth/Throat:     Mouth: Mucous membranes are moist.     Pharynx:  Oropharynx is clear.  Eyes:     Conjunctiva/sclera: Conjunctivae normal.     Pupils: Pupils are equal, round, and reactive to light.  Neck:     Thyroid: No thyromegaly.  Cardiovascular:     Rate and Rhythm: Normal rate and regular rhythm.     Heart sounds: Normal heart sounds.  Pulmonary:     Effort: Pulmonary effort is normal.     Breath sounds: Normal breath sounds.  Abdominal:     General: Abdomen is flat. Bowel sounds are normal.     Palpations: Abdomen is soft. There is no mass.     Tenderness: There is no abdominal tenderness.  Musculoskeletal:        General: Normal range of motion.  Lymphadenopathy:     Cervical: No cervical adenopathy.  Skin:    General: Skin is warm and dry.     Findings: No rash.  Neurological:     General: No focal deficit present.     Mental Status: He is alert.  Psychiatric:        Mood and Affect: Mood normal.        Behavior: Behavior normal.    Assessment/Plan: Please see individual problem list.  Preventative health care Assessment & Plan: Physical exam complete. Lab work reviewed with patient- stable, no changes needed at this time. Colonoscopy- UTD. PSA- UTD. Flu vaccine not due. COVID and Tetanus vaccines- UTD. Shingles vaccine due, patient interested but would like to return to get this or get at his local pharmacy. Recommended establishing with Dentist and following up with Ophthalmology for annual exams. Encouraged to continue healthy diet.    Primary hypertension Assessment & Plan: Chronic. Elevated reading x 2 today in office. Reports home blood pressure readings 140s/70s. He does not want to change or increase his medication at this time. He has not taken his medication today. He reports increased stressors right now and back pain, he has surgery in May. He has been working on his diet and losing weight. He will continue to check his blood pressure daily and follow up with me after surgery to further evaluate and discuss his  medications. We will adjust as needed at that time. He will continue his Losartan 50 mg daily. Denies chest pain. Denies shortness of breath. Strict precautions given to patient.    Stage 3a chronic kidney disease Assessment & Plan: Chronic. Stable. GFR 57 on most recent lab work. Encouraged adequate fluid intake. Discussed importance of blood pressure control. Will continue to monitor.     Return in about 6 weeks (around  12/25/2022) for Follow up.   Bethanie Dicker, NP-C [facility] - [employer]

## 2022-11-13 NOTE — Assessment & Plan Note (Addendum)
Chronic. Stable. GFR 57 on most recent lab work. Encouraged adequate fluid intake. Discussed importance of blood pressure control. Will continue to monitor.

## 2022-11-13 NOTE — Assessment & Plan Note (Addendum)
Chronic. Elevated reading x 2 today in office. Reports home blood pressure readings 140s/70s. He does not want to change or increase his medication at this time. He has not taken his medication today. He reports increased stressors right now and back pain, he has surgery in May. He has been working on his diet and losing weight. He will continue to check his blood pressure daily and follow up with me after surgery to further evaluate and discuss his medications. We will adjust as needed at that time. He will continue his Losartan 50 mg daily. Denies chest pain. Denies shortness of breath. Strict precautions given to patient.

## 2022-11-18 NOTE — Pre-Procedure Instructions (Signed)
Surgical Instructions    Your procedure is scheduled on Thursday, May 9th.  Report to [facility] Main Entrance "A" at 05:30 A.M., then check in with the Admitting office.  Call this number if you have problems the morning of surgery:  [phone]  If you have any questions prior to your surgery date call [phone]: Open Monday-Friday 8am-4pm If you experience any cold or flu symptoms such as cough, fever, chills, shortness of breath, etc. between now and your scheduled surgery, please notify us at the above number.     Remember:  Do not eat or drink after midnight the night before your surgery    Take these medicines the morning of surgery with A SIP OF WATER  amLODipine (NORVASC)  buPROPion (WELLBUTRIN XL)  hydrALAZINE (APRESOLINE)  Nebivolol HCl  sertraline (ZOLOFT)    If needed: baclofen (LIORESAL)    Hold Plavix 5 days prior to surgery. Last dose 5/3.  Follow your surgeon's instructions on when to stop Aspirin.  If no instructions were given by your surgeon then you will need to call the office to get those instructions.     As of today, STOP taking any Aleve, Naproxen, Ibuprofen, Motrin, Advil, Goody's, BC's, all herbal medications, fish oil, and all vitamins.                     Do NOT Smoke (Tobacco/Vaping) for 24 hours prior to your procedure.  If you use a CPAP at night, you may bring your mask/headgear for your overnight stay.   Contacts, glasses, piercing's, hearing aid's, dentures or partials may not be worn into surgery, please bring cases for these belongings.    For patients admitted to the hospital, discharge time will be determined by your treatment team.   Patients discharged the day of surgery will not be allowed to drive home, and someone needs to stay with them for 24 hours.  SURGICAL WAITING ROOM VISITATION Patients having surgery or a procedure may have no more than 2 support people in the waiting area - these visitors may rotate.   Children  under the age of 84 must have an adult with them who is not the patient. If the patient needs to stay at the hospital during part of their recovery, the visitor guidelines for inpatient rooms apply. Pre-op nurse will coordinate an appropriate time for 1 support person to accompany patient in pre-op.  This support person may not rotate.   Please refer to the [facility] website for the visitor guidelines for Inpatients (after your surgery is over and you are in a regular room).      Day of Surgery: Take a shower with CHG soap. Do not wear jewelry or makeup Do not wear lotions, powders, colognes, or deodorant.  Men may shave face and neck. Do not bring valuables to the hospital. [facility] is not responsible for any belongings or valuables. Wear Clean/Comfortable clothing the morning of surgery Remember to brush your teeth WITH YOUR REGULAR TOOTHPASTE.   Please read over the following fact sheets that you were given.    If you received a COVID test during your pre-op visit  it is requested that you wear a mask when out in public, stay away from anyone that may not be feeling well and notify your surgeon if you develop symptoms. If you have been in contact with anyone that has tested positive in the last 10 days please notify you surgeon.

## 2022-11-19 ENCOUNTER — Encounter (HOSPITAL_COMMUNITY): Payer: Self-pay

## 2022-11-19 ENCOUNTER — Other Ambulatory Visit: Payer: Self-pay

## 2022-11-19 ENCOUNTER — Encounter (HOSPITAL_COMMUNITY)
Admission: RE | Admit: 2022-11-19 | Discharge: 2022-11-19 | Disposition: A | Discharge status: Home / Self Care | Payer: Medicare Other | Source: Ambulatory Visit | Attending: Neurosurgery | Admitting: Neurosurgery

## 2022-11-19 VITALS — BP 201/121 | HR 91 | Resp 19 | Ht 68.0 in | Wt 273.3 lb

## 2022-11-19 DIAGNOSIS — Z01812 Encounter for preprocedural laboratory examination: Secondary | ICD-10-CM | POA: Insufficient documentation

## 2022-11-19 DIAGNOSIS — Z01818 Encounter for other preprocedural examination: Secondary | ICD-10-CM

## 2022-11-19 HISTORY — DX: Bipolar disorder, unspecified: F31.9

## 2022-11-19 HISTORY — DX: Personal history of other diseases of the digestive system: Z87.19

## 2022-11-19 HISTORY — DX: Personal history of urinary calculi: Z87.442

## 2022-11-19 LAB — SURGICAL PCR SCREEN
MRSA, PCR: NEGATIVE
Staphylococcus aureus: NEGATIVE

## 2022-11-19 LAB — TYPE AND SCREEN
ABO/RH(D): O POS
Antibody Screen: NEGATIVE

## 2022-11-19 NOTE — Progress Notes (Signed)
PCP - Bethanie Dicker, NP Cardiologist - Dr. Arnoldo Hooker (saw in August 2023 for HTN)  PPM/ICD - denies   Chest x-ray - 01/16/12 EKG - 04/01/22 Stress Test - 12/29/14 ECHO - 10/15/17 Cardiac Cath - denies  Sleep Study - OSA+ CPAP - pt states he wears it "some nights"  DM- denies  Blood Thinner Instructions: Hold Plavix 5 days. Last dose 5/3 Aspirin Instructions: f/u with surgeon  ERAS Protcol - no, NPO PRE-SURGERY Ensure or G2-   COVID TEST- n/a   Anesthesia review: yes, pt saw Dr. Gwen Pounds for control of HTN. Pt states his PCP had talked about doubling his BP meds but thought it may be best to wait until after surgery. Pt's BP at PAT was 201/121, and then checked manually after appt and it was 210/140. Fayrene Fearing was made aware. Pt denied any symptoms. Pt advised to log his BP readings at home and that Fayrene Fearing will f/u with him. He says his BP usually runs 140/70's at home. Pt also advised that if his BP remains >180 systolic and/or >100 diastolic to notify his PCP and if he begins to have any symptoms to immediately contact his provider or go to an urgent care.   Patient denies shortness of breath, fever, cough and chest pain at PAT appointment   All instructions explained to the patient, with a verbal understanding of the material. Patient agrees to go over the instructions while at home for a better understanding.  The opportunity to ask questions was provided.

## 2022-11-20 ENCOUNTER — Ambulatory Visit (INDEPENDENT_AMBULATORY_CARE_PROVIDER_SITE_OTHER): Payer: Medicare Other | Admitting: Nurse Practitioner

## 2022-11-20 ENCOUNTER — Encounter: Payer: Self-pay | Admitting: Nurse Practitioner

## 2022-11-20 ENCOUNTER — Telehealth: Payer: Self-pay | Admitting: Nurse Practitioner

## 2022-11-20 VITALS — BP 195/149 | HR 90 | Temp 98.0°F | Ht 68.0 in | Wt 270.0 lb

## 2022-11-20 DIAGNOSIS — I10 Essential (primary) hypertension: Secondary | ICD-10-CM

## 2022-11-20 NOTE — Telephone Encounter (Signed)
Pt called stating he would like to be called concerning his BP medicaion and changing it

## 2022-11-20 NOTE — Progress Notes (Signed)
Bethanie Dicker, NP-C Phone: [phone]  Jake Brown is a 57 y.o. male who presents today for hypertension.  Patient is scheduled to have back surgery next week. He went to his pre-op testing appointment where his blood pressure was extremely elevated. He presents today to discuss his medications. He does reports an increased amount of stressors currently, which he believes is contributing to his elevated blood pressures. He is concerned about his upcoming surgery and is in a lot of pain. Denies headaches, denies vision changes, denies weakness. He is compliant with all of his medications.   HYPERTENSION Disease Monitoring Home BP Monitoring- 140s/70s, he did not check it today Chest pain- No    Dyspnea- No Medications Compliance-  Norvasc, Hydralazine, Losartan and Nebivolol. Lightheadedness-  No  Edema- No BMET    Component Value Date/Time   NA 137 11/10/2022 0734   NA 141 04/01/2022 1013   K 3.8 11/10/2022 0734   CL 99 11/10/2022 0734   CO2 31 11/10/2022 0734   GLUCOSE 95 11/10/2022 0734   BUN 16 11/10/2022 0734   BUN 19 04/01/2022 1013   CREATININE 1.35 11/10/2022 0734   CREATININE 1.45 (H) 08/13/2018 0802   CREATININE 1.45 (H) 08/13/2018 0802   CALCIUM 9.2 11/10/2022 0734   GFRNONAA 54 (L) 08/13/2018 0802   GFRAA 63 08/13/2018 0802    Social History   Tobacco Use  Smoking Status Former   Packs/day: 1.00   Years: 20.00   Additional pack years: 0.00   Total pack years: 20.00   Types: Cigarettes   Quit date: 07/21/1993   Years since quitting: 29.3  Smokeless Tobacco Former    Current Outpatient Medications on File Prior to Visit  Medication Sig Dispense Refill   amLODipine (NORVASC) 10 MG tablet Take 1 tablet (10 mg total) by mouth daily. Qd d/c 5 mg qd 90 tablet 3   aspirin EC 81 MG tablet Take 1 tablet (81 mg total) by mouth daily. 90 tablet 3   atorvastatin (LIPITOR) 40 MG tablet TAKE ONE TABLET BY MOUTH DAILY AT 6 P.M. 90 tablet 3   baclofen (LIORESAL) 10  MG tablet Take 10 mg by mouth 3 (three) times daily as needed for muscle spasms.     buPROPion (WELLBUTRIN XL) 150 MG 24 hr tablet Take 1 tablet (150 mg total) by mouth daily. Dose reduction 90 tablet 0   fentaNYL (DURAGESIC) 12 MCG/HR Place 1 patch onto the skin every 3 (three) days.     hydrALAZINE (APRESOLINE) 100 MG tablet Take 100 mg by mouth 2 (two) times daily.     Nebivolol HCl 20 MG TABS TAKE 1 TABLET BY MOUTH DAILY ** DISCONTINUE CARVEDILOL 25MG ** 90 tablet 3   NEURONTIN 300 MG capsule Take 600 mg by mouth at bedtime as needed (pain).     sertraline (ZOLOFT) 100 MG tablet Take 1 tablet (100 mg total) by mouth daily. 90 tablet 3   Vitamin D, Ergocalciferol, (DRISDOL) 1.25 MG (50000 UNIT) CAPS capsule Take 1 capsule (50,000 Units total) by mouth every 7 (seven) days. 5 capsule 0   No current facility-administered medications on file prior to visit.    ROS see history of present illness  Objective  Physical Exam Vitals:   11/20/22 1459 11/20/22 1533  BP: (!) 164/127 (!) 195/149  Pulse: 90   Temp: 98 F (36.7 C)   SpO2: 98%     BP Readings from Last 3 Encounters:  11/20/22 (!) 195/149  11/19/22 (!) 201/121  11/13/22 Marland Kitchen)  144/76   Wt Readings from Last 3 Encounters:  11/20/22 270 lb (122.5 kg)  11/19/22 273 lb 4.8 oz (124 kg)  11/13/22 269 lb (122 kg)    Physical Exam Constitutional:      General: He is not in acute distress.    Appearance: Normal appearance.  HENT:     Head: Normocephalic.  Cardiovascular:     Rate and Rhythm: Normal rate and regular rhythm.     Heart sounds: Normal heart sounds.  Pulmonary:     Effort: Pulmonary effort is normal.     Breath sounds: Normal breath sounds.  Skin:    General: Skin is warm and dry.  Neurological:     General: No focal deficit present.     Mental Status: He is alert.  Psychiatric:        Mood and Affect: Mood is anxious. Affect is tearful.        Behavior: Behavior normal.    Assessment/Plan: Please see  individual problem list.  Primary hypertension Assessment & Plan: Chronic. Uncontrolled. More elevated today than it has been. He is agreeable to increasing his medications today. He is supposed to have surgery next week on his back and needs his blood pressure controlled. He is in pain and reports increased stressors. Will increase Losartan to 100 mg daily. He will continue Norvasc 10 mg daily, Nebivolol 20 mg daily, and Hydralazine 100 mg twice daily. He will continue to check his blood pressure daily. Counseled on decreasing stressors the best he can and avoiding salt. Follow up with Pain Management on Monday. Strict return precautions given to patient. He will return next week for a blood pressure check.   Orders: -     Losartan Potassium; Take 1 tablet (100 mg total) by mouth daily.  Dispense: 90 tablet; Refill: 1   Return in 6 days (on 11/26/2022) for Blood pressure check.   Bethanie Dicker, NP-C [facility] - [employer]

## 2022-11-21 ENCOUNTER — Encounter: Payer: Self-pay | Admitting: Nurse Practitioner

## 2022-11-21 MED ORDER — LOSARTAN POTASSIUM 100 MG PO TABS
100.0000 mg | ORAL_TABLET | Freq: Every day | ORAL | 1 refills | Status: DC
Start: 2022-11-21 — End: 2023-07-31

## 2022-11-21 NOTE — Progress Notes (Signed)
Anesthesia Chart Review:  57 year old male with pertinent history including hiatal hernia, insufficiency, OSA on CPAP with variable compliance, renal carcinoma s/p nephrectomy 2001, postoperative nausea vomiting, difficult intubation, CVA 2019 maintained on Plavix, difficult to control hypertension.  Patient reports she has had hypertension has been difficult to manage ever since nephrectomy 2001.  Blood pressure was markedly uncontrolled at admission testing visit, 201/121 on arrival and 210/140 on recheck.  Patient denied any symptoms of headache, chest pain, presyncope, palpitations, shortness of breath.  He does have a blood pressure cuff at home.  He was instructed to keep a log of his blood pressures.  He was also instructed to reach out to his PCP Guillermina City, NP with the readings from today.  He was also instructed to seek immediate care if he develops any symptoms of headache, chest pain, shortness of breath, presyncope, palpitations.  Patient was subsequently seen by Bethanie Dicker, NP on 11/20/2022 for evaluation of uncontrolled hypertension.  Blood pressure recorded is 164/127 on arrival at 195/149 on recheck.  His losartan was increased to 100 mg daily, he was continued on Norvasc 10 mg daily, nebivolol 20 mg daily, and hydralazine 100 mg twice daily.  Instructed to keep a log and follow-up next week for recheck.  I called and spoke with the patient 11/21/2022 to see how his pressure had responded to the medication changes.  He reported significant improvement and provided 3 readings of 130/88, 124/82, and 129/87.  He states he did have one more elevated reading of approximately 160/100 but forgot to save the recording.  Advised that given the significant improvement in control of his blood pressure anticipate he will be able to proceed as planned.  He will follow-up with his PCP next week for recheck.  I also provided him with my phone number so that he can call back if there were any interim  changes.  Pt was seen in followup by Bethanie Dicker, NP on 11/21/22 and BP was noted to under much better control, 137/88 at that time. He was recommended to continue current medications - Norvasc 10 mg daily, Losartan 100 mg daily, Nebivolol 20 mg daily, and Hydralazine 100 mg twice daily.  Patient reported last dose Plavix 11/21/2022.  Follows with neurology at [facility] for hx of intractable migraines, Left sided minimal hemiparesis as sequelae of prior stroke, and severe OSA intolerant to CPAP.  Reported history of difficult intubation. Review of records in care everywhere shows glidescope was used 11/02/20 when pt had biopsy of base of tongue lesion. Documentation states pt has "anterior appearing airway with limited neck ROM". Of note, pathology showed mass to be benign tonsillar-type tissue.   CMP and CBC from 11/10/2022 reviewed, unremarkable.  EKG 04/01/2022: Sinus bradycardia.  Nonspecific T wave abnormality.  Rate 57.  TTE 03/29/2021 (Care Everywhere): INTERPRETATION  NORMAL LEFT VENTRICULAR SYSTOLIC FUNCTION  NORMAL RIGHT VENTRICULAR SYSTOLIC FUNCTION  TRIVIAL REGURGITATION NOTED (See above)  NO VALVULAR STENOSIS   Nuclear stress 12/29/2014: Impression: 1.  The resting electrocardiogram demonstrated normal sinus rhythm, normal resting conduction and no resting arrhythmias.  The stress electrocardiogram was normal.  The patient completed an estimated workload of 7.05 METS, 89% of the maximum predicted heart rate.  The stress test was terminated because of fatigue and THR achieved.  Normal BP response. 2.  Myocardial perfusion imaging is normal.  Overall left ventricular systolic function was normal without regional wall motion abnormalities.  The left ventricular ejection fraction was 71%.   Antionette Poles, PA-C [facility]  Short Stay Center/Anesthesiology Phone [phone] 11/21/2022 12:16 PM

## 2022-11-21 NOTE — Assessment & Plan Note (Signed)
Chronic. Uncontrolled. More elevated today than it has been. He is agreeable to increasing his medications today. He is supposed to have surgery next week on his back and needs his blood pressure controlled. He is in pain and reports increased stressors. Will increase Losartan to 100 mg daily. He will continue Norvasc 10 mg daily, Nebivolol 20 mg daily, and Hydralazine 100 mg twice daily. He will continue to check his blood pressure daily. Counseled on decreasing stressors the best he can and avoiding salt. Follow up with Pain Management on Monday. Strict return precautions given to patient. He will return next week for a blood pressure check.

## 2022-11-24 DIAGNOSIS — M48062 Spinal stenosis, lumbar region with neurogenic claudication: Secondary | ICD-10-CM | POA: Diagnosis not present

## 2022-11-24 DIAGNOSIS — M5416 Radiculopathy, lumbar region: Secondary | ICD-10-CM | POA: Diagnosis not present

## 2022-11-24 DIAGNOSIS — M25561 Pain in right knee: Secondary | ICD-10-CM | POA: Diagnosis not present

## 2022-11-24 DIAGNOSIS — M5412 Radiculopathy, cervical region: Secondary | ICD-10-CM | POA: Diagnosis not present

## 2022-11-25 NOTE — Progress Notes (Unsigned)
Bethanie Dicker, NP-C Phone: [phone]  Jake Brown is a 57 y.o. male who presents today for follow up.  Patient has been checking his blood pressure daily at home. He brought in his machine to check it against ours. He has been compliant with his medications and taking the increased dose of Losartan for 6 days. He is scheduled to have back surgery tomorrow. He denies any new problems or concerns today.   HYPERTENSION Disease Monitoring Home BP Monitoring- 130s/70-80s Chest pain- No    Dyspnea- No Medications Compliance-  Norvasc, Losartan, Nebivolol and Hydralazine. Lightheadedness-  No  Edema- No BMET    Component Value Date/Time   NA 137 11/10/2022 0734   NA 141 04/01/2022 1013   K 3.8 11/10/2022 0734   CL 99 11/10/2022 0734   CO2 31 11/10/2022 0734   GLUCOSE 95 11/10/2022 0734   BUN 16 11/10/2022 0734   BUN 19 04/01/2022 1013   CREATININE 1.35 11/10/2022 0734   CREATININE 1.45 (H) 08/13/2018 0802   CREATININE 1.45 (H) 08/13/2018 0802   CALCIUM 9.2 11/10/2022 0734   GFRNONAA 54 (L) 08/13/2018 0802   GFRAA 63 08/13/2018 0802     Social History   Tobacco Use  Smoking Status Former   Packs/day: 1.00   Years: 20.00   Additional pack years: 0.00   Total pack years: 20.00   Types: Cigarettes   Quit date: 07/21/1993   Years since quitting: 29.3  Smokeless Tobacco Former    Current Outpatient Medications on File Prior to Visit  Medication Sig Dispense Refill   amLODipine (NORVASC) 10 MG tablet Take 1 tablet (10 mg total) by mouth daily. Qd d/c 5 mg qd 90 tablet 3   aspirin EC 81 MG tablet Take 1 tablet (81 mg total) by mouth daily. 90 tablet 3   atorvastatin (LIPITOR) 40 MG tablet TAKE ONE TABLET BY MOUTH DAILY AT 6 P.M. 90 tablet 3   baclofen (LIORESAL) 10 MG tablet Take 10 mg by mouth 3 (three) times daily as needed for muscle spasms.     buPROPion (WELLBUTRIN XL) 150 MG 24 hr tablet Take 1 tablet (150 mg total) by mouth daily. Dose reduction 90 tablet 0    fentaNYL (DURAGESIC) 12 MCG/HR Place 1 patch onto the skin every 3 (three) days.     hydrALAZINE (APRESOLINE) 100 MG tablet Take 100 mg by mouth 2 (two) times daily.     losartan (COZAAR) 100 MG tablet Take 1 tablet (100 mg total) by mouth daily. 90 tablet 1   Nebivolol HCl 20 MG TABS TAKE 1 TABLET BY MOUTH DAILY ** DISCONTINUE CARVEDILOL 25MG ** 90 tablet 3   NEURONTIN 300 MG capsule Take 600 mg by mouth at bedtime as needed (pain).     sertraline (ZOLOFT) 100 MG tablet Take 1 tablet (100 mg total) by mouth daily. 90 tablet 3   Vitamin D, Ergocalciferol, (DRISDOL) 1.25 MG (50000 UNIT) CAPS capsule Take 1 capsule (50,000 Units total) by mouth every 7 (seven) days. 5 capsule 0   No current facility-administered medications on file prior to visit.    ROS see history of present illness  Objective  Physical Exam Vitals:   11/26/22 0814 11/26/22 0815  BP: 130/83 137/88  Pulse: (!) 58   Temp: 98.1 F (36.7 C)   SpO2: 95%     BP Readings from Last 3 Encounters:  11/26/22 137/88  11/20/22 (!) 195/149  11/19/22 (!) 201/121   Wt Readings from Last 3 Encounters:  11/26/22 274  lb 9.6 oz (124.6 kg)  11/20/22 270 lb (122.5 kg)  11/19/22 273 lb 4.8 oz (124 kg)    Physical Exam Constitutional:      General: He is not in acute distress.    Appearance: Normal appearance.  HENT:     Head: Normocephalic.  Cardiovascular:     Rate and Rhythm: Normal rate and regular rhythm.     Heart sounds: Normal heart sounds.  Pulmonary:     Effort: Pulmonary effort is normal.     Breath sounds: Normal breath sounds.  Skin:    General: Skin is warm and dry.  Neurological:     General: No focal deficit present.     Mental Status: He is alert.  Psychiatric:        Mood and Affect: Mood normal.        Behavior: Behavior normal.    Assessment/Plan: Please see individual problem list.  Primary hypertension Assessment & Plan: Chronic. BP stable today. Continue current medication regimen- Norvasc  10 mg daily, Losartan 100 mg daily, Nebivolol 20 mg daily, and Hydralazine 100 mg twice daily. Encouraged to continue checking his blood pressure daily at home. He will follow up in one month. Strict precautions given to patient.    Chronic midline low back pain without sciatica Assessment & Plan: Surgery tomorrow - PLIF L5-S1    Return in 29 days (on 12/25/2022) for Follow up.   Bethanie Dicker, NP-C [facility] - [employer]

## 2022-11-26 ENCOUNTER — Ambulatory Visit (INDEPENDENT_AMBULATORY_CARE_PROVIDER_SITE_OTHER): Payer: Medicare Other | Admitting: Nurse Practitioner

## 2022-11-26 VITALS — BP 137/88 | HR 58 | Temp 98.1°F | Ht 68.0 in | Wt 274.6 lb

## 2022-11-26 DIAGNOSIS — G8929 Other chronic pain: Secondary | ICD-10-CM | POA: Diagnosis not present

## 2022-11-26 DIAGNOSIS — M545 Low back pain, unspecified: Secondary | ICD-10-CM

## 2022-11-26 DIAGNOSIS — I10 Essential (primary) hypertension: Secondary | ICD-10-CM

## 2022-11-26 NOTE — Anesthesia Preprocedure Evaluation (Signed)
Anesthesia Evaluation  Patient identified by MRN, date of birth, ID band Patient awake    Reviewed: Allergy & Precautions, NPO status , Patient's Chart, lab work & pertinent test results  History of Anesthesia Complications (+) PONV, DIFFICULT AIRWAY and history of anesthetic complications  Airway Mallampati: II  TM Distance: >3 FB Neck ROM: Full    Dental no notable dental hx.    Pulmonary sleep apnea and Continuous Positive Airway Pressure Ventilation , former smoker   Pulmonary exam normal        Cardiovascular hypertension, Pt. on medications and Pt. on home beta blockers +CHF   Rhythm:Regular Rate:Normal     Neuro/Psych   Anxiety Depression Bipolar Disorder  Dementia CVA    GI/Hepatic Neg liver ROS, hiatal hernia,,,  Endo/Other  diabetes    Renal/GU CRFRenal disease  negative genitourinary   Musculoskeletal  (+) Arthritis , Osteoarthritis,    Abdominal Normal abdominal exam  (+)   Peds  Hematology negative hematology ROS (+)   Anesthesia Other Findings   Reproductive/Obstetrics                             Anesthesia Physical Anesthesia Plan  ASA: 3  Anesthesia Plan: General   Post-op Pain Management:    Induction: Intravenous  PONV Risk Score and Plan: 3 and Ondansetron, Dexamethasone and Treatment may vary due to age or medical condition  Airway Management Planned: Mask and Oral ETT  Additional Equipment: None  Intra-op Plan:   Post-operative Plan: Extubation in OR  Informed Consent: I have reviewed the patients History and Physical, chart, labs and discussed the procedure including the risks, benefits and alternatives for the proposed anesthesia with the patient or authorized representative who has indicated his/her understanding and acceptance.     Dental advisory given  Plan Discussed with: CRNA  Anesthesia Plan Comments: (PAT note by Antionette Poles,  PA-C:  57 year old male with pertinent history including hiatal hernia, insufficiency, OSA on CPAP with variable compliance, renal carcinoma s/p nephrectomy 2001, postoperative nausea vomiting, difficult intubation, CVA 2019 maintained on Plavix, difficult to control hypertension.  Patient reports she has had hypertension has been difficult to manage ever since nephrectomy 2001.  Blood pressure was markedly uncontrolled at admission testing visit, 201/121 on arrival and 210/140 on recheck.  Patient denied any symptoms of headache, chest pain, presyncope, palpitations, shortness of breath.  He does have a blood pressure cuff at home.  He was instructed to keep a log of his blood pressures.  He was also instructed to reach out to his PCP Guillermina City, NP with the readings from today.  He was also instructed to seek immediate care if he develops any symptoms of headache, chest pain, shortness of breath, presyncope, palpitations.  Patient was subsequently seen by Bethanie Dicker, NP on 11/20/2022 for evaluation of uncontrolled hypertension.  Blood pressure recorded is 164/127 on arrival at 195/149 on recheck.  His losartan was increased to 100 mg daily, he was continued on Norvasc 10 mg daily, nebivolol 20 mg daily, and hydralazine 100 mg twice daily.  Instructed to keep a log and follow-up next week for recheck.  I called and spoke with the patient 11/21/2022 to see how his pressure had responded to the medication changes.  He reported significant improvement and provided 3 readings of 130/88, 124/82, and 129/87.  He states he did have one more elevated reading of approximately 160/100 but forgot to save the recording.  Advised that given the significant improvement in control of his blood pressure anticipate he will be able to proceed as planned.  He will follow-up with his PCP next week for recheck.  I also provided him with my phone number so that he can call back if there were any interim changes.  Pt was seen in  followup by Bethanie Dicker, NP on 11/21/22 and BP was noted to under much better control, 137/88 at that time. He was recommended to continue current medications - Norvasc 10 mg daily, Losartan 100 mg daily, Nebivolol 20 mg daily, and Hydralazine 100 mg twice daily.  Patient reported last dose Plavix 11/21/2022.  Follows with neurology at [facility] for hx of intractable migraines, Left sided minimal hemiparesis as sequelae of prior stroke, and severe OSA intolerant to CPAP.  Reported history of difficult intubation. Review of records in care everywhere shows glidescope was used 11/02/20 when pt had biopsy of base of tongue lesion. Documentation states pt has "anterior appearing airway with limited neck ROM". Of note, pathology showed mass to be benign tonsillar-type tissue.   CMP and CBC from 11/10/2022 reviewed, unremarkable.  EKG 04/01/2022: Sinus bradycardia.  Nonspecific T wave abnormality.  Rate 57.  TTE 03/29/2021 (Care Everywhere): INTERPRETATION  NORMAL LEFT VENTRICULAR SYSTOLIC FUNCTION  NORMAL RIGHT VENTRICULAR SYSTOLIC FUNCTION  TRIVIAL REGURGITATION NOTED (See above)  NO VALVULAR STENOSIS   Nuclear stress 12/29/2014: Impression: 1.  The resting electrocardiogram demonstrated normal sinus rhythm, normal resting conduction and no resting arrhythmias.  The stress electrocardiogram was normal.  The patient completed an estimated workload of 7.05 METS, 89% of the maximum predicted heart rate.  The stress test was terminated because of fatigue and THR achieved.  Normal BP response. 2.  Myocardial perfusion imaging is normal.  Overall left ventricular systolic function was normal without regional wall motion abnormalities.  The left ventricular ejection fraction was 71%.  )        Anesthesia Quick Evaluation

## 2022-11-26 NOTE — Assessment & Plan Note (Signed)
Chronic. BP stable today. Continue current medication regimen- Norvasc 10 mg daily, Losartan 100 mg daily, Nebivolol 20 mg daily, and Hydralazine 100 mg twice daily. Encouraged to continue checking his blood pressure daily at home. He will follow up in one month. Strict precautions given to patient.

## 2022-11-26 NOTE — Assessment & Plan Note (Addendum)
Surgery tomorrow - PLIF L5-S1

## 2022-11-27 ENCOUNTER — Ambulatory Visit (HOSPITAL_BASED_OUTPATIENT_CLINIC_OR_DEPARTMENT_OTHER): Payer: Medicare Other | Admitting: Physician Assistant

## 2022-11-27 ENCOUNTER — Ambulatory Visit (HOSPITAL_COMMUNITY)
Admission: RE | Admit: 2022-11-27 | Discharge: 2022-11-28 | Disposition: A | Discharge status: Home / Self Care | Payer: Medicare Other | Attending: Neurosurgery | Admitting: Neurosurgery

## 2022-11-27 ENCOUNTER — Other Ambulatory Visit: Payer: Self-pay

## 2022-11-27 ENCOUNTER — Ambulatory Visit (HOSPITAL_COMMUNITY): Payer: Medicare Other

## 2022-11-27 ENCOUNTER — Ambulatory Visit (HOSPITAL_COMMUNITY): Payer: Medicare Other | Admitting: Physician Assistant

## 2022-11-27 ENCOUNTER — Encounter (HOSPITAL_COMMUNITY)
Admission: RE | Disposition: A | Discharge status: Home / Self Care | Payer: Self-pay | Source: Home / Self Care | Attending: Neurosurgery

## 2022-11-27 ENCOUNTER — Encounter (HOSPITAL_COMMUNITY): Payer: Self-pay | Admitting: Neurosurgery

## 2022-11-27 DIAGNOSIS — F418 Other specified anxiety disorders: Secondary | ICD-10-CM

## 2022-11-27 DIAGNOSIS — M48062 Spinal stenosis, lumbar region with neurogenic claudication: Secondary | ICD-10-CM

## 2022-11-27 DIAGNOSIS — M5117 Intervertebral disc disorders with radiculopathy, lumbosacral region: Secondary | ICD-10-CM

## 2022-11-27 DIAGNOSIS — M4317 Spondylolisthesis, lumbosacral region: Secondary | ICD-10-CM

## 2022-11-27 DIAGNOSIS — M4807 Spinal stenosis, lumbosacral region: Secondary | ICD-10-CM

## 2022-11-27 DIAGNOSIS — G4733 Obstructive sleep apnea (adult) (pediatric): Secondary | ICD-10-CM | POA: Diagnosis not present

## 2022-11-27 DIAGNOSIS — E1122 Type 2 diabetes mellitus with diabetic chronic kidney disease: Secondary | ICD-10-CM | POA: Insufficient documentation

## 2022-11-27 DIAGNOSIS — Z8673 Personal history of transient ischemic attack (TIA), and cerebral infarction without residual deficits: Secondary | ICD-10-CM | POA: Insufficient documentation

## 2022-11-27 DIAGNOSIS — F319 Bipolar disorder, unspecified: Secondary | ICD-10-CM | POA: Insufficient documentation

## 2022-11-27 DIAGNOSIS — Z87891 Personal history of nicotine dependence: Secondary | ICD-10-CM | POA: Insufficient documentation

## 2022-11-27 DIAGNOSIS — Z7902 Long term (current) use of antithrombotics/antiplatelets: Secondary | ICD-10-CM | POA: Insufficient documentation

## 2022-11-27 DIAGNOSIS — I130 Hypertensive heart and chronic kidney disease with heart failure and stage 1 through stage 4 chronic kidney disease, or unspecified chronic kidney disease: Secondary | ICD-10-CM | POA: Insufficient documentation

## 2022-11-27 DIAGNOSIS — N183 Chronic kidney disease, stage 3 unspecified: Secondary | ICD-10-CM | POA: Insufficient documentation

## 2022-11-27 DIAGNOSIS — I509 Heart failure, unspecified: Secondary | ICD-10-CM | POA: Diagnosis not present

## 2022-11-27 DIAGNOSIS — Z85528 Personal history of other malignant neoplasm of kidney: Secondary | ICD-10-CM | POA: Insufficient documentation

## 2022-11-27 DIAGNOSIS — M4326 Fusion of spine, lumbar region: Secondary | ICD-10-CM | POA: Diagnosis not present

## 2022-11-27 DIAGNOSIS — Z905 Acquired absence of kidney: Secondary | ICD-10-CM | POA: Insufficient documentation

## 2022-11-27 DIAGNOSIS — Z981 Arthrodesis status: Secondary | ICD-10-CM | POA: Diagnosis not present

## 2022-11-27 HISTORY — PX: BACK SURGERY: SHX140

## 2022-11-27 LAB — ABO/RH: ABO/RH(D): O POS

## 2022-11-27 SURGERY — POSTERIOR LUMBAR FUSION 1 LEVEL
Anesthesia: General

## 2022-11-27 MED ORDER — FENTANYL CITRATE (PF) 250 MCG/5ML IJ SOLN
INTRAMUSCULAR | Status: DC | PRN
  Administered 2022-11-27 (×2): 50 ug via INTRAVENOUS
  Administered 2022-11-27: 150 ug via INTRAVENOUS

## 2022-11-27 MED ORDER — LIDOCAINE 2% (20 MG/ML) 5 ML SYRINGE
INTRAMUSCULAR | Status: DC | PRN
  Administered 2022-11-27: 20 mg via INTRAVENOUS

## 2022-11-27 MED ORDER — HYDROMORPHONE HCL 1 MG/ML IJ SOLN
INTRAMUSCULAR | Status: AC
  Filled 2022-11-27: qty 1

## 2022-11-27 MED ORDER — SERTRALINE HCL 100 MG PO TABS
100.0000 mg | ORAL_TABLET | Freq: Every day | ORAL | Status: DC
  Administered 2022-11-28: 100 mg via ORAL
  Filled 2022-11-27: qty 1

## 2022-11-27 MED ORDER — ACETAMINOPHEN 325 MG PO TABS: 650.0000 mg | ORAL_TABLET | ORAL | Status: DC | PRN

## 2022-11-27 MED ORDER — THROMBIN 5000 UNITS EX SOLR
CUTANEOUS | Status: AC
  Filled 2022-11-27: qty 5000

## 2022-11-27 MED ORDER — BACITRACIN ZINC 500 UNIT/GM EX OINT
TOPICAL_OINTMENT | CUTANEOUS | Status: DC | PRN
  Administered 2022-11-27: 1 via TOPICAL

## 2022-11-27 MED ORDER — PHENYLEPHRINE 80 MCG/ML (10ML) SYRINGE FOR IV PUSH (FOR BLOOD PRESSURE SUPPORT)
PREFILLED_SYRINGE | INTRAVENOUS | Status: DC | PRN
  Administered 2022-11-27 (×2): 160 ug via INTRAVENOUS

## 2022-11-27 MED ORDER — BISACODYL 10 MG RE SUPP: 10.0000 mg | Freq: Every day | RECTAL | Status: DC | PRN

## 2022-11-27 MED ORDER — MORPHINE SULFATE (PF) 4 MG/ML IV SOLN: 4.0000 mg | INTRAVENOUS | Status: DC | PRN

## 2022-11-27 MED ORDER — SODIUM CHLORIDE 0.9% FLUSH: 3.0000 mL | INTRAVENOUS | Status: DC | PRN

## 2022-11-27 MED ORDER — FENTANYL CITRATE (PF) 100 MCG/2ML IJ SOLN
25.0000 ug | INTRAMUSCULAR | Status: DC | PRN
  Administered 2022-11-27 (×3): 50 ug via INTRAVENOUS

## 2022-11-27 MED ORDER — BUPIVACAINE LIPOSOME 1.3 % IJ SUSP
INTRAMUSCULAR | Status: DC | PRN
  Administered 2022-11-27: 20 mL

## 2022-11-27 MED ORDER — ONDANSETRON HCL 4 MG/2ML IJ SOLN
INTRAMUSCULAR | Status: DC | PRN
  Administered 2022-11-27: 4 mg via INTRAVENOUS

## 2022-11-27 MED ORDER — HYDROMORPHONE HCL 1 MG/ML IJ SOLN
INTRAMUSCULAR | Status: AC
  Filled 2022-11-27: qty 0.5

## 2022-11-27 MED ORDER — OXYCODONE HCL 5 MG PO TABS
10.0000 mg | ORAL_TABLET | ORAL | Status: DC | PRN
  Administered 2022-11-27 – 2022-11-28 (×4): 10 mg via ORAL
  Filled 2022-11-27 (×4): qty 2

## 2022-11-27 MED ORDER — ACETAMINOPHEN 10 MG/ML IV SOLN
INTRAVENOUS | Status: AC
  Filled 2022-11-27: qty 100

## 2022-11-27 MED ORDER — ONDANSETRON HCL 4 MG PO TABS: 4.0000 mg | ORAL_TABLET | Freq: Four times a day (QID) | ORAL | Status: DC | PRN

## 2022-11-27 MED ORDER — 0.9 % SODIUM CHLORIDE (POUR BTL) OPTIME
TOPICAL | Status: DC | PRN
  Administered 2022-11-27: 1000 mL

## 2022-11-27 MED ORDER — FENTANYL CITRATE (PF) 100 MCG/2ML IJ SOLN
INTRAMUSCULAR | Status: AC
  Filled 2022-11-27: qty 2

## 2022-11-27 MED ORDER — PHENYLEPHRINE 80 MCG/ML (10ML) SYRINGE FOR IV PUSH (FOR BLOOD PRESSURE SUPPORT)
PREFILLED_SYRINGE | INTRAVENOUS | Status: AC
  Filled 2022-11-27: qty 10

## 2022-11-27 MED ORDER — SUCCINYLCHOLINE CHLORIDE 200 MG/10ML IV SOSY
PREFILLED_SYRINGE | INTRAVENOUS | Status: AC
  Filled 2022-11-27: qty 10

## 2022-11-27 MED ORDER — TAMSULOSIN HCL 0.4 MG PO CAPS
0.4000 mg | ORAL_CAPSULE | Freq: Every day | ORAL | Status: DC
  Administered 2022-11-27 – 2022-11-28 (×2): 0.4 mg via ORAL
  Filled 2022-11-27 (×2): qty 1

## 2022-11-27 MED ORDER — HYDROMORPHONE HCL 1 MG/ML IJ SOLN
0.2500 mg | INTRAMUSCULAR | Status: DC | PRN
  Administered 2022-11-27: 0.5 mg via INTRAVENOUS

## 2022-11-27 MED ORDER — ONDANSETRON HCL 4 MG/2ML IJ SOLN: 4.0000 mg | Freq: Four times a day (QID) | INTRAMUSCULAR | Status: DC | PRN

## 2022-11-27 MED ORDER — ROCURONIUM BROMIDE 10 MG/ML (PF) SYRINGE
PREFILLED_SYRINGE | INTRAVENOUS | Status: AC
  Filled 2022-11-27: qty 20

## 2022-11-27 MED ORDER — BUPIVACAINE-EPINEPHRINE (PF) 0.5% -1:200000 IJ SOLN
INTRAMUSCULAR | Status: DC | PRN
  Administered 2022-11-27: 10 mL via PERINEURAL

## 2022-11-27 MED ORDER — SODIUM CHLORIDE 0.9 % IV SOLN: 250.0000 mL | INTRAVENOUS | Status: DC

## 2022-11-27 MED ORDER — BUPIVACAINE-EPINEPHRINE (PF) 0.5% -1:200000 IJ SOLN
INTRAMUSCULAR | Status: AC
  Filled 2022-11-27: qty 30

## 2022-11-27 MED ORDER — OXYCODONE HCL 5 MG PO TABS: 5.0000 mg | ORAL_TABLET | ORAL | Status: DC | PRN

## 2022-11-27 MED ORDER — HYDROMORPHONE HCL 1 MG/ML IJ SOLN
INTRAMUSCULAR | Status: DC | PRN
  Administered 2022-11-27 (×2): .25 mg via INTRAVENOUS

## 2022-11-27 MED ORDER — ROCURONIUM BROMIDE 10 MG/ML (PF) SYRINGE
PREFILLED_SYRINGE | INTRAVENOUS | Status: DC | PRN
  Administered 2022-11-27: 10 mg via INTRAVENOUS
  Administered 2022-11-27: 80 mg via INTRAVENOUS
  Administered 2022-11-27: 50 mg via INTRAVENOUS
  Administered 2022-11-27: 30 mg via INTRAVENOUS

## 2022-11-27 MED ORDER — ATORVASTATIN CALCIUM 40 MG PO TABS
40.0000 mg | ORAL_TABLET | Freq: Every day | ORAL | Status: DC
  Administered 2022-11-27 – 2022-11-28 (×2): 40 mg via ORAL
  Filled 2022-11-27 (×2): qty 1

## 2022-11-27 MED ORDER — ONDANSETRON HCL 4 MG/2ML IJ SOLN
INTRAMUSCULAR | Status: AC
  Filled 2022-11-27: qty 2

## 2022-11-27 MED ORDER — BACLOFEN 10 MG PO TABS
10.0000 mg | ORAL_TABLET | Freq: Three times a day (TID) | ORAL | Status: DC | PRN
  Administered 2022-11-27 – 2022-11-28 (×2): 10 mg via ORAL
  Filled 2022-11-27 (×2): qty 1

## 2022-11-27 MED ORDER — LACTATED RINGERS IV SOLN: INTRAVENOUS | Status: DC

## 2022-11-27 MED ORDER — SUGAMMADEX SODIUM 200 MG/2ML IV SOLN
INTRAVENOUS | Status: DC | PRN
  Administered 2022-11-27: 244 mg via INTRAVENOUS

## 2022-11-27 MED ORDER — BUPIVACAINE LIPOSOME 1.3 % IJ SUSP
INTRAMUSCULAR | Status: AC
  Filled 2022-11-27: qty 20

## 2022-11-27 MED ORDER — PHENOL 1.4 % MT LIQD: 1.0000 | OROMUCOSAL | Status: DC | PRN

## 2022-11-27 MED ORDER — ACETAMINOPHEN 650 MG RE SUPP: 650.0000 mg | RECTAL | Status: DC | PRN

## 2022-11-27 MED ORDER — BACITRACIN ZINC 500 UNIT/GM EX OINT
TOPICAL_OINTMENT | CUTANEOUS | Status: AC
  Filled 2022-11-27: qty 28.35

## 2022-11-27 MED ORDER — BISACODYL 5 MG PO TBEC
5.0000 mg | DELAYED_RELEASE_TABLET | Freq: Every day | ORAL | Status: DC | PRN
  Administered 2022-11-27: 5 mg via ORAL
  Filled 2022-11-27: qty 1

## 2022-11-27 MED ORDER — DEXAMETHASONE SODIUM PHOSPHATE 10 MG/ML IJ SOLN
INTRAMUSCULAR | Status: DC | PRN
  Administered 2022-11-27: 10 mg via INTRAVENOUS

## 2022-11-27 MED ORDER — LOSARTAN POTASSIUM 50 MG PO TABS
100.0000 mg | ORAL_TABLET | Freq: Every day | ORAL | Status: DC
  Administered 2022-11-28: 100 mg via ORAL
  Filled 2022-11-27 (×2): qty 2

## 2022-11-27 MED ORDER — MIDAZOLAM HCL 2 MG/2ML IJ SOLN
INTRAMUSCULAR | Status: DC | PRN
  Administered 2022-11-27: 2 mg via INTRAVENOUS

## 2022-11-27 MED ORDER — SURGIRINSE WOUND IRRIGATION SYSTEM - OPTIME: TOPICAL | Status: DC | PRN

## 2022-11-27 MED ORDER — GLYCOPYRROLATE PF 0.2 MG/ML IJ SOSY
PREFILLED_SYRINGE | INTRAMUSCULAR | Status: DC | PRN
  Administered 2022-11-27: .1 mg via INTRAVENOUS

## 2022-11-27 MED ORDER — CHLORHEXIDINE GLUCONATE 0.12 % MT SOLN
15.0000 mL | Freq: Once | OROMUCOSAL | Status: AC
  Administered 2022-11-27: 15 mL via OROMUCOSAL
  Filled 2022-11-27: qty 15

## 2022-11-27 MED ORDER — PROPOFOL 10 MG/ML IV BOLUS
INTRAVENOUS | Status: DC | PRN
  Administered 2022-11-27: 200 mg via INTRAVENOUS

## 2022-11-27 MED ORDER — LIDOCAINE 2% (20 MG/ML) 5 ML SYRINGE
INTRAMUSCULAR | Status: AC
  Filled 2022-11-27: qty 5

## 2022-11-27 MED ORDER — HYDRALAZINE HCL 50 MG PO TABS
100.0000 mg | ORAL_TABLET | Freq: Two times a day (BID) | ORAL | Status: DC
  Administered 2022-11-27 – 2022-11-28 (×2): 100 mg via ORAL
  Filled 2022-11-27 (×2): qty 2

## 2022-11-27 MED ORDER — FENTANYL 12 MCG/HR TD PT72
1.0000 | MEDICATED_PATCH | TRANSDERMAL | Status: DC
  Administered 2022-11-27: 1 via TRANSDERMAL
  Filled 2022-11-27: qty 1

## 2022-11-27 MED ORDER — CHLORHEXIDINE GLUCONATE CLOTH 2 % EX PADS: 6.0000 | MEDICATED_PAD | Freq: Once | CUTANEOUS | Status: DC

## 2022-11-27 MED ORDER — SUCCINYLCHOLINE CHLORIDE 200 MG/10ML IV SOSY
PREFILLED_SYRINGE | INTRAVENOUS | Status: DC | PRN
  Administered 2022-11-27: 160 mg via INTRAVENOUS

## 2022-11-27 MED ORDER — DEXAMETHASONE SODIUM PHOSPHATE 10 MG/ML IJ SOLN
INTRAMUSCULAR | Status: AC
  Filled 2022-11-27: qty 1

## 2022-11-27 MED ORDER — ACETAMINOPHEN 500 MG PO TABS
1000.0000 mg | ORAL_TABLET | Freq: Four times a day (QID) | ORAL | Status: AC
  Administered 2022-11-27 – 2022-11-28 (×4): 1000 mg via ORAL
  Filled 2022-11-27 (×5): qty 2

## 2022-11-27 MED ORDER — ACETAMINOPHEN 10 MG/ML IV SOLN
1000.0000 mg | Freq: Once | INTRAVENOUS | Status: DC | PRN
  Administered 2022-11-27: 1000 mg via INTRAVENOUS

## 2022-11-27 MED ORDER — MENTHOL 3 MG MT LOZG: 1.0000 | LOZENGE | OROMUCOSAL | Status: DC | PRN

## 2022-11-27 MED ORDER — SODIUM CHLORIDE 0.9% FLUSH
3.0000 mL | Freq: Two times a day (BID) | INTRAVENOUS | Status: DC
  Administered 2022-11-27: 3 mL via INTRAVENOUS

## 2022-11-27 MED ORDER — GABAPENTIN 300 MG PO CAPS
600.0000 mg | ORAL_CAPSULE | Freq: Every evening | ORAL | Status: DC | PRN
  Administered 2022-11-27: 600 mg via ORAL
  Filled 2022-11-27: qty 2

## 2022-11-27 MED ORDER — CEFAZOLIN IN SODIUM CHLORIDE 3-0.9 GM/100ML-% IV SOLN
3.0000 g | INTRAVENOUS | Status: AC
  Administered 2022-11-27: 3 g via INTRAVENOUS
  Filled 2022-11-27: qty 100

## 2022-11-27 MED ORDER — CEFAZOLIN SODIUM-DEXTROSE 2-4 GM/100ML-% IV SOLN
2.0000 g | Freq: Three times a day (TID) | INTRAVENOUS | Status: AC
  Administered 2022-11-27 (×2): 2 g via INTRAVENOUS
  Filled 2022-11-27 (×2): qty 100

## 2022-11-27 MED ORDER — MIDAZOLAM HCL 2 MG/2ML IJ SOLN
INTRAMUSCULAR | Status: AC
  Filled 2022-11-27: qty 2

## 2022-11-27 MED ORDER — DOCUSATE SODIUM 100 MG PO CAPS
100.0000 mg | ORAL_CAPSULE | Freq: Two times a day (BID) | ORAL | Status: DC
  Administered 2022-11-27 – 2022-11-28 (×3): 100 mg via ORAL
  Filled 2022-11-27 (×3): qty 1

## 2022-11-27 MED ORDER — CEFAZOLIN SODIUM 1 G IJ SOLR
INTRAMUSCULAR | Status: AC
  Filled 2022-11-27: qty 10

## 2022-11-27 MED ORDER — PROPOFOL 10 MG/ML IV BOLUS
INTRAVENOUS | Status: AC
  Filled 2022-11-27: qty 20

## 2022-11-27 MED ORDER — ORAL CARE MOUTH RINSE: 15.0000 mL | Freq: Once | OROMUCOSAL | Status: AC

## 2022-11-27 MED ORDER — THROMBIN 5000 UNITS EX SOLR
OROMUCOSAL | Status: DC | PRN
  Administered 2022-11-27 (×2): 5 mL via TOPICAL

## 2022-11-27 MED ORDER — PHENYLEPHRINE HCL-NACL 20-0.9 MG/250ML-% IV SOLN
INTRAVENOUS | Status: DC | PRN
  Administered 2022-11-27: 20 ug/min via INTRAVENOUS

## 2022-11-27 MED ORDER — FENTANYL CITRATE (PF) 250 MCG/5ML IJ SOLN
INTRAMUSCULAR | Status: AC
  Filled 2022-11-27: qty 5

## 2022-11-27 MED ORDER — EPHEDRINE SULFATE-NACL 50-0.9 MG/10ML-% IV SOSY
PREFILLED_SYRINGE | INTRAVENOUS | Status: DC | PRN
  Administered 2022-11-27: 2.5 mg via INTRAVENOUS

## 2022-11-27 MED ORDER — BUPROPION HCL ER (XL) 150 MG PO TB24
150.0000 mg | ORAL_TABLET | Freq: Every day | ORAL | Status: DC
  Administered 2022-11-28: 150 mg via ORAL
  Filled 2022-11-27: qty 1

## 2022-11-27 MED ORDER — AMLODIPINE BESYLATE 10 MG PO TABS
10.0000 mg | ORAL_TABLET | Freq: Every day | ORAL | Status: DC
  Administered 2022-11-28: 10 mg via ORAL
  Filled 2022-11-27 (×2): qty 1

## 2022-11-27 SURGICAL SUPPLY — 71 items
APL SKNCLS STERI-STRIP NONHPOA (GAUZE/BANDAGES/DRESSINGS) ×1
BAG COUNTER SPONGE SURGICOUNT (BAG) ×2 IMPLANT
BAG SPNG CNTER NS LX DISP (BAG) ×2
BASKET BONE COLLECTION (BASKET) ×2 IMPLANT
BENZOIN TINCTURE PRP APPL 2/3 (GAUZE/BANDAGES/DRESSINGS) ×2 IMPLANT
BLADE CLIPPER SURG (BLADE) IMPLANT
BUR MATCHSTICK NEURO 3.0 LAGG (BURR) ×2 IMPLANT
BUR PRECISION FLUTE 6.0 (BURR) ×2 IMPLANT
CAGE ALTERA 10X31X9-13 15D (Cage) IMPLANT
CANISTER SUCT 3000ML PPV (MISCELLANEOUS) ×2 IMPLANT
CAP LOCK DLX THRD (Cap) IMPLANT
CNTNR URN SCR LID CUP LEK RST (MISCELLANEOUS) ×2 IMPLANT
CONT SPEC 4OZ STRL OR WHT (MISCELLANEOUS) ×1
COVER BACK TABLE 60X90IN (DRAPES) ×2 IMPLANT
DRAPE C-ARM 42X72 X-RAY (DRAPES) ×4 IMPLANT
DRAPE HALF SHEET 40X57 (DRAPES) ×2 IMPLANT
DRAPE LAPAROTOMY 100X72X124 (DRAPES) ×2 IMPLANT
DRAPE SURG 17X23 STRL (DRAPES) ×8 IMPLANT
DRSG OPSITE POSTOP 4X6 (GAUZE/BANDAGES/DRESSINGS) ×2 IMPLANT
ELECT BLADE 4.0 EZ CLEAN MEGAD (MISCELLANEOUS) ×1
ELECT REM PT RETURN 9FT ADLT (ELECTROSURGICAL) ×1
ELECTRODE BLDE 4.0 EZ CLN MEGD (MISCELLANEOUS) ×2 IMPLANT
ELECTRODE REM PT RTRN 9FT ADLT (ELECTROSURGICAL) ×2 IMPLANT
EVACUATOR 1/8 PVC DRAIN (DRAIN) IMPLANT
GAUZE 4X4 16PLY ~~LOC~~+RFID DBL (SPONGE) ×2 IMPLANT
GLOVE BIO SURGEON STRL SZ 6 (GLOVE) ×2 IMPLANT
GLOVE BIO SURGEON STRL SZ8 (GLOVE) ×4 IMPLANT
GLOVE BIO SURGEON STRL SZ8.5 (GLOVE) ×4 IMPLANT
GLOVE BIOGEL PI IND STRL 6.5 (GLOVE) ×2 IMPLANT
GLOVE EXAM NITRILE XL STR (GLOVE) IMPLANT
GLOVE SURG SS PI 6.5 STRL IVOR (GLOVE) IMPLANT
GOWN STRL REUS W/ TWL LRG LVL3 (GOWN DISPOSABLE) ×2 IMPLANT
GOWN STRL REUS W/ TWL XL LVL3 (GOWN DISPOSABLE) ×4 IMPLANT
GOWN STRL REUS W/TWL 2XL LVL3 (GOWN DISPOSABLE) IMPLANT
GOWN STRL REUS W/TWL LRG LVL3 (GOWN DISPOSABLE) ×2
GOWN STRL REUS W/TWL XL LVL3 (GOWN DISPOSABLE) ×2
HEMOSTAT POWDER KIT SURGIFOAM (HEMOSTASIS) ×2 IMPLANT
KIT BASIN OR (CUSTOM PROCEDURE TRAY) ×2 IMPLANT
KIT GRAFTMAG DEL NEURO DISP (NEUROSURGERY SUPPLIES) IMPLANT
KIT POSITION SURG JACKSON T1 (MISCELLANEOUS) ×2 IMPLANT
KIT TURNOVER KIT B (KITS) ×2 IMPLANT
NDL HYPO 21X1.5 SAFETY (NEEDLE) IMPLANT
NEEDLE HYPO 21X1.5 SAFETY (NEEDLE) ×1 IMPLANT
NEEDLE HYPO 22GX1.5 SAFETY (NEEDLE) ×2 IMPLANT
NS IRRIG 1000ML POUR BTL (IV SOLUTION) ×2 IMPLANT
PACK LAMINECTOMY NEURO (CUSTOM PROCEDURE TRAY) ×2 IMPLANT
PAD ARMBOARD 7.5X6 YLW CONV (MISCELLANEOUS) ×6 IMPLANT
PATTIES SURGICAL .5 X1 (DISPOSABLE) IMPLANT
PATTIES SURGICAL 1X1 (DISPOSABLE) IMPLANT
PUTTY DBM 5CC CALC GRAN (Putty) IMPLANT
ROD CREO DLX CVD 6.35X40 (Rod) IMPLANT
ROD CURVED TI 6.35X40 (Rod) ×2 IMPLANT
SCREW PA DLX CREO 7.5X45 (Screw) IMPLANT
SCREW PA DLX CREO 7.5X55 (Screw) IMPLANT
SOL ELECTROSURG ANTI STICK (MISCELLANEOUS)
SOLUTION ELECTROSURG ANTI STCK (MISCELLANEOUS) ×2 IMPLANT
SOLUTION IRRIG SURGIPHOR (IV SOLUTION) ×2 IMPLANT
SPIKE FLUID TRANSFER (MISCELLANEOUS) ×2 IMPLANT
SPONGE NEURO XRAY DETECT 1X3 (DISPOSABLE) IMPLANT
SPONGE SURGIFOAM ABS GEL 100 (HEMOSTASIS) IMPLANT
SPONGE T-LAP 4X18 ~~LOC~~+RFID (SPONGE) IMPLANT
STRIP CLOSURE SKIN 1/2X4 (GAUZE/BANDAGES/DRESSINGS) ×2 IMPLANT
SUT VIC AB 1 CT1 18XBRD ANBCTR (SUTURE) ×4 IMPLANT
SUT VIC AB 1 CT1 8-18 (SUTURE) ×2
SUT VIC AB 2-0 CP2 18 (SUTURE) ×4 IMPLANT
SYR 20ML LL LF (SYRINGE) IMPLANT
TAP SURG GLBU 6.5X40 (ORTHOPEDIC DISPOSABLE SUPPLIES) IMPLANT
TOWEL GREEN STERILE (TOWEL DISPOSABLE) ×2 IMPLANT
TOWEL GREEN STERILE FF (TOWEL DISPOSABLE) ×2 IMPLANT
TRAY FOLEY MTR SLVR 16FR STAT (SET/KITS/TRAYS/PACK) ×2 IMPLANT
WATER STERILE IRR 1000ML POUR (IV SOLUTION) ×2 IMPLANT

## 2022-11-27 NOTE — Anesthesia Postprocedure Evaluation (Signed)
Anesthesia Post Note  Patient: Jake Brown  Procedure(s) Performed: Posterior Lumbar Interbody Fusion ,Interbody Prothesis,Posterior Instrumentation, Lumbar five-Sacral one     Patient location during evaluation: PACU Anesthesia Type: General Level of consciousness: awake and alert Pain management: pain level controlled Vital Signs Assessment: post-procedure vital signs reviewed and stable Respiratory status: spontaneous breathing, nonlabored ventilation, respiratory function stable and patient connected to nasal cannula oxygen Cardiovascular status: blood pressure returned to baseline and stable Postop Assessment: no apparent nausea or vomiting Anesthetic complications: yes   Encounter Notable Events  Notable Event Outcome Phase Comment  Difficult to intubate - expected  Intraprocedure Filed from anesthesia note documentation.    Last Vitals:  Vitals:   11/27/22 1313 11/27/22 1539  BP: 132/66 (!) 180/108  Pulse: 63 79  Resp: 16 16  Temp: 36.7 C 36.7 C  SpO2: 90% 96%    Last Pain:  Vitals:   11/27/22 1430  TempSrc:   PainSc: 6                  Jake Brown

## 2022-11-27 NOTE — Op Note (Signed)
Brief history: The patient is a 57 year old white male who has had chronic back pain.  He has developed worsening back and leg pain consistent with a lumbosacral radiculopathy.  He failed medical management.  He was worked up with lumbar x-rays and lumbar MRI which demonstrated a lumbosacral spondylolisthesis, foraminal Stenosis, etc.  I discussed the various treatment options with him.  He has decided proceed with surgery.  Preoperative diagnosis: Lumbosacral spondylolisthesis, foraminal stenosis, degenerative disc disease, spinal stenosis compressing both the L5 and the S1 nerve roots; lumbago; lumbar radiculopathy; neurogenic claudication  Postoperative diagnosis: The same  Procedure: Bilateral L5-S1 laminotomy/foraminotomies/medial facetectomy to decompress the bilateral L5 and S1 nerve roots(the work required to do this was in addition to the work required to do the posterior lumbar interbody fusion because of the patient's spinal stenosis, facet arthropathy. Etc. requiring a wide decompression of the nerve roots.);  Right L5-S1 transforaminal lumbar interbody fusion with local morselized autograft bone and Zimmer DBM; insertion of interbody prosthesis at L5-S1 (globus peek expandable interbody prosthesis); posterior nonsegmental instrumentation from L5 to S1 with globus titanium pedicle screws and rods; posterior lateral arthrodesis at L5-S1 with local morselized autograft bone and Zimmer DBM.  Surgeon: Dr. Delma Officer  Asst.: Hildred Priest  Anesthesia: Gen. endotracheal  Estimated blood loss: 250 cc  Drains: None  Complications: None  Description of procedure: The patient was brought to the operating room by the anesthesia team. General endotracheal anesthesia was induced. The patient was turned to the prone position on the Wilson frame. The patient's lumbosacral region was then prepared with Betadine scrub and Betadine solution. Sterile drapes were applied.  I then injected the area to  be incised with Marcaine with epinephrine solution. I then used the scalpel to make a linear midline incision over the L5-S1 interspace. I then used electrocautery to perform a bilateral subperiosteal dissection exposing the spinous process and lamina of L5-S1. We then obtained intraoperative radiograph to confirm our location. We then inserted the Verstrac retractor to provide exposure.  I began the decompression by using the high speed drill to perform laminotomies at L5-S1 bilaterally. We then used the Kerrison punches to widen the laminotomy and removed the ligamentum flavum at L5-S1 bilaterally. We used the Kerrison punches to remove the medial facets at L5-1 bilaterally. We performed wide foraminotomies about the bilateral L5 and S1 nerve roots completing the decompression.  We now turned our attention to the posterior lumbar interbody fusion. I used a scalpel to incise the intervertebral disc at L5-S1 bilaterally. I then performed a partial intervertebral discectomy at L5-S1 bilaterally using the pituitary forceps. We prepared the vertebral endplates at L5-S1 bilaterally for the fusion by removing the soft tissues with the curettes. We then used the trial spacers to pick the appropriate sized interbody prosthesis. We prefilled his prosthesis with a combination of local morselized autograft bone that we obtained during the decompression as well as Zimmer DBM. We inserted the prefilled prosthesis into the interspace at L5-S1 from the right, we then turned and expanded the prosthesis. There was a good snug fit of the prosthesis in the interspace. We then filled and the remainder of the intervertebral disc space with local morselized autograft bone and Zimmer DBM. This completed the posterior lumbar interbody arthrodesis.  During the decompression and insertion of the prosthesis the assistant protected the thecal sac and nerve roots with the D'Errico retractor.  We now turned attention to the  instrumentation. Under fluoroscopic guidance we cannulated the bilateral L5 and S1  pedicles with the bone probe. We then removed the bone probe. We then tapped the pedicle with a 6.5 millimeter tap. We then removed the tap. We probed inside the tapped pedicle with a ball probe to rule out cortical breaches. We then inserted a 7.5 x 45 and 55 millimeter pedicle screw into the L5 and S1 pedicles bilaterally under fluoroscopic guidance. We then palpated along the medial aspect of the pedicles to rule out cortical breaches. There were none. The nerve roots were not injured. We then connected the unilateral pedicle screws with a lordotic rod. We compressed the construct and secured the rod in place with the caps. We then tightened the caps appropriately. This completed the instrumentation from L5-S1.  We now turned our attention to the posterior lateral arthrodesis at L5-S1. We used the high-speed drill to decorticate the remainder of the facets, pars, transverse process at L5-S1. We then applied a combination of local morselized autograft bone and Zimmer DBM over these decorticated posterior lateral structures. This completed the posterior lateral arthrodesis.  We then obtained hemostasis using bipolar electrocautery. We irrigated the wound out with Betadine solution. We inspected the thecal sac and nerve roots and noted they were well decompressed. We then removed the retractor.  We injected Exparel . We reapproximated patient's thoracolumbar fascia with interrupted #1 Vicryl suture. We reapproximated patient's subcutaneous tissue with interrupted 2-0 Vicryl suture. The reapproximated patient's skin with Steri-Strips and benzoin. The wound was then coated with bacitracin ointment. A sterile dressing was applied. The drapes were removed. The patient was subsequently returned to the supine position where they were extubated by the anesthesia team. He was then transported to the post anesthesia care unit in stable  condition. All sponge instrument and needle counts were reportedly correct at the end of this case.

## 2022-11-27 NOTE — Progress Notes (Signed)
Pt unable to void post Foley removal. Per Pt he was having difficulty prior to surgery and was told by his PCP that he needed to see his Urologist but Pt hasn't been able to do so. I &O cath performed and 500cc of clear yellow urine was removed. Will continue to monitor Pt. Rema Fendt, RN

## 2022-11-27 NOTE — Transfer of Care (Signed)
Immediate Anesthesia Transfer of Care Note  Patient: RICAHRD NICOLOFF  Procedure(s) Performed: Posterior Lumbar Interbody Fusion ,Interbody Prothesis,Posterior Instrumentation, Lumbar five-Sacral one  Patient Location: PACU  Anesthesia Type:General  Level of Consciousness: awake  Airway & Oxygen Therapy: Patient Spontanous Breathing and Patient connected to face mask oxygen  Post-op Assessment: Report given to RN and Post -op Vital signs reviewed and stable  Post vital signs: Reviewed and stable  Last Vitals:  Vitals Value Taken Time  BP  11/27/22 1200  Temp    Pulse 77 11/27/22 1202  Resp 15 11/27/22 1202  SpO2 97 % 11/27/22 1202  Vitals shown include unvalidated device data.  Last Pain:  Vitals:   11/27/22 0628  TempSrc:   PainSc: 7       Patients Stated Pain Goal: 3 (11/27/22 7829)  Complications:  Encounter Notable Events  Notable Event Outcome Phase Comment  Difficult to intubate - expected  Intraprocedure Filed from anesthesia note documentation.
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
## 2022-11-27 NOTE — Anesthesia Procedure Notes (Signed)
Procedure Name: Intubation Date/Time: 11/27/2022 7:48 AM  Performed by: Zollie Beckers, CRNAPre-anesthesia Checklist: Patient identified, Emergency Drugs available, Suction available and Patient being monitored Patient Re-evaluated:Patient Re-evaluated prior to induction Oxygen Delivery Method: Circle system utilized Preoxygenation: Pre-oxygenation with 100% oxygen Induction Type: IV induction, Rapid sequence and Cricoid Pressure applied Laryngoscope Size: Glidescope and 4 Grade View: Grade I Tube type: Oral Tube size: 7.5 mm Number of attempts: 1 Airway Equipment and Method: Stylet Placement Confirmation: ETT inserted through vocal cords under direct vision, positive ETCO2 and breath sounds checked- equal and bilateral Secured at: 23 cm Tube secured with: Tape Dental Injury: Teeth and Oropharynx as per pre-operative assessment  Difficulty Due To: Difficulty was anticipated, Difficult Airway- due to anterior larynx, Difficult Airway- due to limited oral opening and Difficult Airway- due to large tongue

## 2022-11-27 NOTE — Progress Notes (Signed)
Orthopedic Tech Progress Note Patient Details:  [name] Nov 09, 1965 [id number]  Pt is not needing an LSO as they already have one.  Patient ID: Jake Brown, male   DOB: 31-Oct-1965, 57 y.o.   MRN: [id number]  Docia Furl 11/27/2022, 4:04 PM

## 2022-11-28 DIAGNOSIS — I130 Hypertensive heart and chronic kidney disease with heart failure and stage 1 through stage 4 chronic kidney disease, or unspecified chronic kidney disease: Secondary | ICD-10-CM | POA: Diagnosis not present

## 2022-11-28 DIAGNOSIS — Z85528 Personal history of other malignant neoplasm of kidney: Secondary | ICD-10-CM | POA: Diagnosis not present

## 2022-11-28 DIAGNOSIS — G4733 Obstructive sleep apnea (adult) (pediatric): Secondary | ICD-10-CM | POA: Diagnosis not present

## 2022-11-28 DIAGNOSIS — I509 Heart failure, unspecified: Secondary | ICD-10-CM | POA: Diagnosis not present

## 2022-11-28 DIAGNOSIS — M4807 Spinal stenosis, lumbosacral region: Secondary | ICD-10-CM | POA: Diagnosis not present

## 2022-11-28 DIAGNOSIS — Z905 Acquired absence of kidney: Secondary | ICD-10-CM | POA: Diagnosis not present

## 2022-11-28 DIAGNOSIS — F418 Other specified anxiety disorders: Secondary | ICD-10-CM | POA: Diagnosis not present

## 2022-11-28 DIAGNOSIS — M48062 Spinal stenosis, lumbar region with neurogenic claudication: Secondary | ICD-10-CM | POA: Diagnosis not present

## 2022-11-28 DIAGNOSIS — M5117 Intervertebral disc disorders with radiculopathy, lumbosacral region: Secondary | ICD-10-CM | POA: Diagnosis not present

## 2022-11-28 DIAGNOSIS — F319 Bipolar disorder, unspecified: Secondary | ICD-10-CM | POA: Diagnosis not present

## 2022-11-28 DIAGNOSIS — Z7902 Long term (current) use of antithrombotics/antiplatelets: Secondary | ICD-10-CM | POA: Diagnosis not present

## 2022-11-28 DIAGNOSIS — M4317 Spondylolisthesis, lumbosacral region: Secondary | ICD-10-CM | POA: Diagnosis not present

## 2022-11-28 DIAGNOSIS — Z8673 Personal history of transient ischemic attack (TIA), and cerebral infarction without residual deficits: Secondary | ICD-10-CM | POA: Diagnosis not present

## 2022-11-28 DIAGNOSIS — N183 Chronic kidney disease, stage 3 unspecified: Secondary | ICD-10-CM | POA: Diagnosis not present

## 2022-11-28 DIAGNOSIS — E1122 Type 2 diabetes mellitus with diabetic chronic kidney disease: Secondary | ICD-10-CM | POA: Diagnosis not present

## 2022-11-28 DIAGNOSIS — Z87891 Personal history of nicotine dependence: Secondary | ICD-10-CM | POA: Diagnosis not present

## 2022-11-28 MED ORDER — OXYCODONE-ACETAMINOPHEN 5-325 MG PO TABS: 1.0000 | ORAL_TABLET | ORAL | 0 refills | Status: DC | PRN

## 2022-11-28 MED ORDER — DOCUSATE SODIUM 100 MG PO CAPS: 100.0000 mg | ORAL_CAPSULE | Freq: Two times a day (BID) | ORAL | 0 refills | Status: DC

## 2022-11-28 MED ORDER — OXYCODONE-ACETAMINOPHEN 5-325 MG PO TABS
1.0000 | ORAL_TABLET | ORAL | Status: DC | PRN
  Administered 2022-11-28: 2 via ORAL
  Filled 2022-11-28: qty 2

## 2022-11-28 NOTE — Discharge Summary (Signed)
Physician Discharge Summary  Patient ID: Jake Brown MRN: [id number] DOB/AGE: December 26, 1965 57 y.o.  Admit date: 11/27/2022 Discharge date: 11/28/2022  Admission Diagnoses: Lumbosacral spondylolisthesis, lumbosacral foraminal stenosis, lumbosacral radiculopathy, lumbago, lumbar degenerative disc disease  Discharge Diagnoses: The same Principal Problem:   Spondylolisthesis of lumbosacral region   Discharged Condition: good  Hospital Course: I performed an L5-S1 decompression, instrumentation and fusion on the patient on 11/27/2022.  The surgery went well.  The patient's postoperative course was remarkable for temporary urinary retention which quickly resolved.  On postoperative day #1 the patient felt well and requested discharge to home.  He was given verbal and written discharge instructions.  All his questions were answered.  Consults: PT, care management Significant Diagnostic Studies: None Treatments: L5-S1 decompression, instrumentation and fusion. Discharge Exam: Blood pressure (!) 138/93, pulse 88, temperature 97.8 F (36.6 C), temperature source Oral, resp. rate 20, height 5\' 8"  (1.727 m), weight 122 kg, SpO2 93 %. The patient is alert and pleasant.  His strength is normal.  He looks well.  Disposition: Home   Allergies as of 11/28/2022   No Known Allergies      Medication List     TAKE these medications    amLODipine 10 MG tablet Commonly known as: NORVASC Take 1 tablet (10 mg total) by mouth daily. Qd d/c 5 mg qd   aspirin EC 81 MG tablet Take 1 tablet (81 mg total) by mouth daily.   atorvastatin 40 MG tablet Commonly known as: LIPITOR TAKE ONE TABLET BY MOUTH DAILY AT 6 P.M.   baclofen 10 MG tablet Commonly known as: LIORESAL Take 10 mg by mouth 3 (three) times daily as needed for muscle spasms.   buPROPion 150 MG 24 hr tablet Commonly known as: Wellbutrin XL Take 1 tablet (150 mg total) by mouth daily. Dose reduction   docusate sodium 100 MG  capsule Commonly known as: COLACE Take 1 capsule (100 mg total) by mouth 2 (two) times daily.   fentaNYL 12 MCG/HR Commonly known as: DURAGESIC Place 1 patch onto the skin every 3 (three) days.   hydrALAZINE 100 MG tablet Commonly known as: APRESOLINE Take 100 mg by mouth 2 (two) times daily.   losartan 100 MG tablet Commonly known as: COZAAR Take 1 tablet (100 mg total) by mouth daily.   Nebivolol HCl 20 MG Tabs TAKE 1 TABLET BY MOUTH DAILY ** DISCONTINUE CARVEDILOL 25MG **   Neurontin 300 MG capsule Generic drug: gabapentin Take 600 mg by mouth at bedtime as needed (pain).   oxyCODONE-acetaminophen 5-325 MG tablet Commonly known as: PERCOCET/ROXICET Take 1-2 tablets by mouth every 4 (four) hours as needed for moderate pain.   sertraline 100 MG tablet Commonly known as: ZOLOFT Take 1 tablet (100 mg total) by mouth daily.   Vitamin D (Ergocalciferol) 1.25 MG (50000 UNIT) Caps capsule Commonly known as: DRISDOL Take 1 capsule (50,000 Units total) by mouth every 7 (seven) days.         Signed: Cristi Loron 11/28/2022, 6:47 AM

## 2022-11-28 NOTE — Evaluation (Signed)
Physical Therapy Evaluation Patient Details Name: Jake Brown MRN: [id number] DOB: 12/30/1965 Today's Date: 11/28/2022  History of Present Illness  57 yo male s/p L5-S1 PLIF on 5/9 due to ongoing back and bilateral leg pain. PMH: anxiety, arthritis, bipolar, CA of kidney, CKDIII, DDD, dementia, depression, PTSD, CVA 2019, HTN, HLD, cervical surgery, and prior back surgery.   Clinical Impression  Pt in bed upon arrival of PT, agreeable to evaluation at this time. Prior to admission the pt was mobilizing with use of cane and was limited by pain in back and LE. He does report hx of falling, most related to chronic L hemiparesis with resulting L toe drag. The pt now presents with limitations in functional mobility, strength, and endurance due to above dx, resulting pain, and chronic residual weakness, and will continue to benefit from skilled PT to address these deficits. He was able to complete initial sit-stand transfers and gait with minG and use of RW. No overt buckling of knees in stance, but pt does have intermittent L knee hyperextension with gait in addition to difficulty advancing and clearing his LLE. Pt educated on progressive walking program, mobility limitations, use of back brace, and HEP for LLE with no additional questions. He is safe to return home with assist from his significant other, but will benefit from additional skilled PT to address LLE strength and safety with ambulation as pt's deficits make him at increased risk for additional falls.        Recommendations for follow up therapy are one component of a multi-disciplinary discharge planning process, led by the attending physician.  Recommendations may be updated based on patient status, additional functional criteria and insurance authorization.  Follow Up Recommendations       Assistance Recommended at Discharge Intermittent Supervision/Assistance  Patient can return home with the following  A little help with  walking and/or transfers;Help with stairs or ramp for entrance;Assistance with cooking/housework    Equipment Recommendations Rolling walker (2 wheels);BSC/3in1  Recommendations for Other Services       Functional Status Assessment Patient has had a recent decline in their functional status and demonstrates the ability to make significant improvements in function in a reasonable and predictable amount of time.     Precautions / Restrictions Precautions Precautions: Back Precaution Booklet Issued: Yes (comment) Precaution Comments: chronic L-sided weakness from CVA with L toe drag Required Braces or Orthoses: Spinal Brace Spinal Brace: Lumbar corset;Applied in sitting position Restrictions Weight Bearing Restrictions: No      Mobility  Bed Mobility Overal bed mobility: Needs Assistance             General bed mobility comments: pt sitting EOB at start and end of session, discussed log roll    Transfers Overall transfer level: Needs assistance Equipment used: Rolling walker (2 wheels) Transfers: Sit to/from Stand Sit to Stand: Min guard           General transfer comment: ming for safety, no buckling. pt dependent on BUE to rise to standing.    Ambulation/Gait Ambulation/Gait assistance: Min guard Gait Distance (Feet): 150 Feet Assistive device: Rolling walker (2 wheels) Gait Pattern/deviations: Step-through pattern, Decreased stride length, Decreased dorsiflexion - left, Decreased dorsiflexion - right, Knee hyperextension - left, Shuffle, Trunk flexed Gait velocity: 0.28 m/s Gait velocity interpretation: <1.31 ft/sec, indicative of household ambulator   General Gait Details: pt with minimal clearance bilaterally, maintains trunk flexed and benefits from cues for positioning in RW (repeated x4). intermittent L knee hyperextension (pt  reports this is baseline), no L knee buckling. pt with poor clearance and advancement of LLE     Balance Overall balance  assessment: Needs assistance, History of Falls Sitting-balance support: No upper extremity supported, Feet supported Sitting balance-Leahy Scale: Good     Standing balance support: Bilateral upper extremity supported, During functional activity Standing balance-Leahy Scale: Fair Standing balance comment: at least single UE support                             Pertinent Vitals/Pain Pain Assessment Pain Assessment: Faces Faces Pain Scale: Hurts little more Pain Location: back, incision Pain Descriptors / Indicators: Discomfort, Grimacing Pain Intervention(s): Limited activity within patient's tolerance, Monitored during session, Repositioned    Home Living Family/patient expects to be discharged to:: Private residence Living Arrangements: Alone Available Help at Discharge: Friend(s);Available PRN/intermittently (girlfriend) Type of Home: Apartment Home Access: Stairs to enter Entrance Stairs-Rails: None Entrance Stairs-Number of Steps: 1, but pt sometimes "walks around it"   Home Layout: One level Home Equipment: Cane - single point      Prior Function Prior Level of Function : Independent/Modified Independent;Driving;History of Falls (last six months)             Mobility Comments: pt had one fall up stair into apt due to L toe drag. otherwise typically walks his dog "pinto bean" 5x/day ADLs Comments: pt reports independence     Hand Dominance   Dominant Hand: Right    Extremity/Trunk Assessment   Upper Extremity Assessment Upper Extremity Assessment: Defer to OT evaluation    Lower Extremity Assessment Lower Extremity Assessment: LLE deficits/detail LLE Deficits / Details: pt with chronic weakness, grossly 3-/5 to MMT at ankle, knee, and pt unable to lift hip for hip flexion (weak and pain). reports grossly numb from stroke. does hyperextend at times which pt attributes to hx of L femur fx. LLE Sensation: decreased light touch;decreased  proprioception LLE Coordination: decreased fine motor;decreased gross motor    Cervical / Trunk Assessment Cervical / Trunk Assessment: Back Surgery  Communication   Communication: No difficulties  Cognition Arousal/Alertness: Awake/alert Behavior During Therapy: WFL for tasks assessed/performed Overall Cognitive Status: Within Functional Limits for tasks assessed                                 General Comments: tangential, but aware of safety concerns        General Comments General comments (skin integrity, edema, etc.): VSS on RA, pt given HEP for LE exercises    Exercises General Exercises - Lower Extremity Long Arc Quad: AROM, Left, 10 reps, Seated Toe Raises: AROM, Left, 10 reps, Seated Heel Raises: AROM, Left, 10 reps, Seated   Assessment/Plan    PT Assessment Patient needs continued PT services  PT Problem List Decreased strength;Decreased range of motion;Decreased activity tolerance;Decreased balance;Decreased mobility       PT Treatment Interventions DME instruction;Gait training;Stair training;Functional mobility training;Therapeutic activities;Therapeutic exercise;Balance training;Patient/family education    PT Goals (Current goals can be found in the Care Plan section)  Acute Rehab PT Goals Patient Stated Goal: return to full independence PT Goal Formulation: With patient Time For Goal Achievement: 12/12/22 Potential to Achieve Goals: Good    Frequency Min 5X/week        AM-PAC PT "6 Clicks" Mobility  Outcome Measure Help needed turning from your back to your side while in a  flat bed without using bedrails?: A Little Help needed moving from lying on your back to sitting on the side of a flat bed without using bedrails?: A Little Help needed moving to and from a bed to a chair (including a wheelchair)?: A Little Help needed standing up from a chair using your arms (e.g., wheelchair or bedside chair)?: A Little Help needed to walk in  hospital room?: A Little Help needed climbing 3-5 steps with a railing? : A Little 6 Click Score: 18    End of Session Equipment Utilized During Treatment: Gait belt;Back brace Activity Tolerance: Patient tolerated treatment well Patient left: in bed;with call bell/phone within reach (sitting EOB) Nurse Communication: Mobility status PT Visit Diagnosis: Unsteadiness on feet (R26.81);Muscle weakness (generalized) (M62.81);History of falling (Z91.81);Pain Pain - part of body:  (back)    Time: 1610-9604 PT Time Calculation (min) (ACUTE ONLY): 39 min   Charges:   PT Evaluation $PT Eval Low Complexity: 1 Low PT Treatments $Gait Training: 8-22 mins $Therapeutic Exercise: 8-22 mins        Vickki Muff, PT, DPT   Acute Rehabilitation Department Office [phone] Secure Chat Communication Preferred  Ronnie Derby 11/28/2022, 9:22 AM

## 2022-11-28 NOTE — Progress Notes (Signed)
Pt doing well. Pt and friend given D/C instructions with verbal understanding. Rx's were sent to the pharmacy by MD. Pt's incision is clean and dry with no sign of infection. Pt's IV was removed prior to D/C. Pt D/C'd home via wheelchair per MD order. Pt received RW and 3-n-1 from [employer] per MD order. Pt is stable @ D/C and has no other needs at this time. Rema Fendt, RN

## 2022-11-28 NOTE — Evaluation (Signed)
Occupational Therapy Evaluation Patient Details Name: Jake Brown MRN: [id number] DOB: 01-03-1966 Today's Date: 11/28/2022   History of Present Illness 57 yo male s/p L5-S1 PLIF on 5/9 due to ongoing back and bilateral leg pain. PMH: anxiety, arthritis, bipolar, CA of kidney, CKDIII, DDD, dementia, depression, PTSD, CVA 2019, HTN, HLD, cervical surgery, and prior back surgery.   Clinical Impression   Patient evaluated by Occupational Therapy with no further acute OT needs identified. All education has been completed and the patient has no further questions. See below for any follow-up Occupational Therapy or equipment needs. OT to sign off. Thank you for referral.        Recommendations for follow up therapy are one component of a multi-disciplinary discharge planning process, led by the attending physician.  Recommendations may be updated based on patient status, additional functional criteria and insurance authorization.   Assistance Recommended at Discharge None  Patient can return home with the following      Functional Status Assessment  Patient has had a recent decline in their functional status and demonstrates the ability to make significant improvements in function in a reasonable and predictable amount of time.  Equipment Recommendations  Other (comment) (RW)    Recommendations for Other Services       Precautions / Restrictions Precautions Precautions: Back Precaution Booklet Issued: Yes (comment) Precaution Comments: chronic L-sided weakness from CVA with L toe drag Required Braces or Orthoses: Spinal Brace Spinal Brace: Lumbar corset;Applied in sitting position Restrictions Weight Bearing Restrictions: No      Mobility Bed Mobility Overal bed mobility: Needs Assistance Bed Mobility: Rolling, Sit to Supine, Supine to Sit Rolling: Supervision   Supine to sit: Supervision Sit to supine: Supervision   General bed mobility comments: return demo well     Transfers Overall transfer level: Needs assistance Equipment used: Rolling walker (2 wheels) Transfers: Sit to/from Stand Sit to Stand: Supervision                  Balance Overall balance assessment: Needs assistance, History of Falls Sitting-balance support: No upper extremity supported, Feet supported Sitting balance-Leahy Scale: Good     Standing balance support: Bilateral upper extremity supported, During functional activity Standing balance-Leahy Scale: Fair Standing balance comment: at least single UE support                           ADL either performed or assessed with clinical judgement   ADL Overall ADL's : Needs assistance/impaired Eating/Feeding: Independent   Grooming: Wash/dry hands;Wash/dry face;Oral care;Modified independent   Upper Body Bathing: Modified independent;Sitting   Lower Body Bathing: Supervison/ safety;Sit to/from stand;With adaptive equipment   Upper Body Dressing : Modified independent;With adaptive equipment   Lower Body Dressing: Supervision/safety;Sit to/from stand;Adhering to back precautions;With adaptive equipment Lower Body Dressing Details (indicate cue type and reason): reacher used and had a reacher at home Toilet Transfer: Supervision/safety;Ambulation;Regular Toilet;Rolling walker (2 wheels)           Functional mobility during ADLs: Supervision/safety;Rolling walker (2 wheels) General ADL Comments: back brace don doff with education. pt able to return demo     Vision Baseline Vision/History: 1 Wears glasses Patient Visual Report: No change from baseline       Perception     Praxis      Pertinent Vitals/Pain Pain Assessment Pain Assessment: Faces Faces Pain Scale: Hurts little more Pain Location: back, incision Pain Descriptors / Indicators: Discomfort, Grimacing Pain Intervention(s):  Monitored during session, Premedicated before session, Repositioned     Hand Dominance Right    Extremity/Trunk Assessment Upper Extremity Assessment Upper Extremity Assessment: Overall WFL for tasks assessed   Lower Extremity Assessment Lower Extremity Assessment: Defer to PT evaluation LLE Deficits / Details: pt with chronic weakness, grossly 3-/5 to MMT at ankle, knee, and pt unable to lift hip for hip flexion (weak and pain). reports grossly numb from stroke. does hyperextend at times which pt attributes to hx of L femur fx. LLE Sensation: decreased light touch;decreased proprioception LLE Coordination: decreased fine motor;decreased gross motor   Cervical / Trunk Assessment Cervical / Trunk Assessment: Back Surgery   Communication Communication Communication: No difficulties   Cognition Arousal/Alertness: Awake/alert Behavior During Therapy: WFL for tasks assessed/performed Overall Cognitive Status: Within Functional Limits for tasks assessed                                 General Comments: tangential, but aware of safety concerns     General Comments  VSS on RA    Exercises     Shoulder Instructions      Home Living Family/patient expects to be discharged to:: Private residence Living Arrangements: Alone Available Help at Discharge: Friend(s);Available PRN/intermittently (girlfriend) Type of Home: Apartment Home Access: Stairs to enter [employer] of Steps: 1, but pt sometimes "walks around it" Entrance Stairs-Rails: None Home Layout: One level     Bathroom Shower/Tub: Chief Strategy Officer: Standard     Home Equipment: Cane - single point;Adaptive equipment Adaptive Equipment: Reacher Additional Comments: has a dog name pinto bean      Prior Functioning/Environment Prior Level of Function : Independent/Modified Independent;Driving;History of Falls (last six months)             Mobility Comments: pt had one fall up stair into apt due to L toe drag. otherwise typically walks his dog "pinto bean" 5x/day ADLs  Comments: pt reports independence        OT Problem List:        OT Treatment/Interventions:      OT Goals(Current goals can be found in the care plan section) Acute Rehab OT Goals Patient Stated Goal: to be able to care for dog Pinto Bean and walk Potential to Achieve Goals: Good  OT Frequency:      Co-evaluation              AM-PAC OT "6 Clicks" Daily Activity     Outcome Measure Help from another person eating meals?: None Help from another person taking care of personal grooming?: None Help from another person toileting, which includes using toliet, bedpan, or urinal?: None Help from another person bathing (including washing, rinsing, drying)?: None Help from another person to put on and taking off regular upper body clothing?: None Help from another person to put on and taking off regular lower body clothing?: None 6 Click Score: 24   End of Session Equipment Utilized During Treatment: Rolling walker (2 wheels);Back brace Nurse Communication: Mobility status;Precautions  Activity Tolerance: Patient tolerated treatment well Patient left: in bed;with call bell/phone within reach  OT Visit Diagnosis: Unsteadiness on feet (R26.81)                Time: 2440-1027 OT Time Calculation (min): 44 min Charges:  OT General Charges $OT Visit: 1 Visit OT Evaluation $OT Eval Moderate Complexity: 1 Mod OT Treatments $Self Care/Home Management :  8-22 mins   Timmothy Euler, OTR/L  Acute Rehabilitation Services Office: [phone] .   Mateo Flow 11/28/2022, 11:40 AM

## 2022-12-10 MED FILL — Heparin Sodium (Porcine) Inj 1000 Unit/ML: INTRAMUSCULAR | Qty: 30 | Status: AC

## 2022-12-19 DIAGNOSIS — M5417 Radiculopathy, lumbosacral region: Secondary | ICD-10-CM | POA: Diagnosis not present

## 2022-12-25 ENCOUNTER — Ambulatory Visit: Payer: Medicare Other | Admitting: Nurse Practitioner

## 2023-01-08 ENCOUNTER — Ambulatory Visit (INDEPENDENT_AMBULATORY_CARE_PROVIDER_SITE_OTHER): Payer: Medicare Other | Admitting: Nurse Practitioner

## 2023-01-08 ENCOUNTER — Encounter: Payer: Self-pay | Admitting: Nurse Practitioner

## 2023-01-08 VITALS — BP 128/70 | HR 54 | Temp 98.3°F | Ht 68.0 in | Wt 263.6 lb

## 2023-01-08 DIAGNOSIS — I10 Essential (primary) hypertension: Secondary | ICD-10-CM | POA: Diagnosis not present

## 2023-01-08 DIAGNOSIS — E785 Hyperlipidemia, unspecified: Secondary | ICD-10-CM

## 2023-01-08 DIAGNOSIS — F339 Major depressive disorder, recurrent, unspecified: Secondary | ICD-10-CM

## 2023-01-08 DIAGNOSIS — M545 Low back pain, unspecified: Secondary | ICD-10-CM | POA: Diagnosis not present

## 2023-01-08 DIAGNOSIS — E559 Vitamin D deficiency, unspecified: Secondary | ICD-10-CM

## 2023-01-08 DIAGNOSIS — G8929 Other chronic pain: Secondary | ICD-10-CM

## 2023-01-08 NOTE — Progress Notes (Signed)
Bethanie Dicker, NP-C Phone: [phone]  Jake Brown is a 57 y.o. male who presents today for follow up.   Back surgery on 11/24/2022- PLIF L5-S1. Surgery went well, he has noticed improvement in his pain. He has a follow up scheduled with his surgeon next month to discuss physical therapy options. He anticipates his pain to continue to improve as he heals over the next few months.   Depression- He stopped taking his Zoloft in April. He feels that the Wellbutrin helps more, he has continued to take it daily. He believes that a lot of his symptoms are due to being unable to do activities due to his pain, he is anticipating this to improve since he has had surgery. Denies SI/HI. PHQ-7 and GAD- 7 today.   HYPERTENSION Disease Monitoring Home BP Monitoring- 140s/90s Chest pain- No    Dyspnea- No Medications Compliance-  Norvasc, Losartan, Bystolic and Hydralazine.   Lightheadedness-  No  Edema- No BMET    Component Value Date/Time   NA 137 11/10/2022 0734   NA 141 04/01/2022 1013   K 3.8 11/10/2022 0734   CL 99 11/10/2022 0734   CO2 31 11/10/2022 0734   GLUCOSE 95 11/10/2022 0734   BUN 16 11/10/2022 0734   BUN 19 04/01/2022 1013   CREATININE 1.35 11/10/2022 0734   CREATININE 1.45 (H) 08/13/2018 0802   CREATININE 1.45 (H) 08/13/2018 0802   CALCIUM 9.2 11/10/2022 0734   GFRNONAA 54 (L) 08/13/2018 0802   GFRAA 63 08/13/2018 0802   HYPERLIPIDEMIA Symptoms Chest pain on exertion:  No   Leg claudication:   No Medications: Compliance- Lipitor Right upper quadrant pain- No  Muscle aches- No Lipid Panel     Component Value Date/Time   CHOL 161 11/10/2022 0734   TRIG 151.0 (H) 11/10/2022 0734   HDL 42.10 11/10/2022 0734   CHOLHDL 4 11/10/2022 0734   VLDL 30.2 11/10/2022 0734   LDLCALC 88 11/10/2022 0734   LDLCALC 54 08/13/2018 0802     Social History   Tobacco Use  Smoking Status Former   Packs/day: 1.00   Years: 20.00   Additional pack years: 0.00   Total pack  years: 20.00   Types: Cigarettes   Quit date: 07/21/1993   Years since quitting: 29.5  Smokeless Tobacco Former    Current Outpatient Medications on File Prior to Visit  Medication Sig Dispense Refill   amLODipine (NORVASC) 10 MG tablet Take 1 tablet (10 mg total) by mouth daily. Qd d/c 5 mg qd 90 tablet 3   aspirin EC 81 MG tablet Take 1 tablet (81 mg total) by mouth daily. 90 tablet 3   atorvastatin (LIPITOR) 40 MG tablet TAKE ONE TABLET BY MOUTH DAILY AT 6 P.M. 90 tablet 3   baclofen (LIORESAL) 10 MG tablet Take 10 mg by mouth 3 (three) times daily as needed for muscle spasms.     buPROPion (WELLBUTRIN XL) 150 MG 24 hr tablet Take 1 tablet (150 mg total) by mouth daily. Dose reduction 90 tablet 0   docusate sodium (COLACE) 100 MG capsule Take 1 capsule (100 mg total) by mouth 2 (two) times daily. 30 capsule 0   fentaNYL (DURAGESIC) 12 MCG/HR Place 1 patch onto the skin every 3 (three) days.     hydrALAZINE (APRESOLINE) 100 MG tablet Take 100 mg by mouth 2 (two) times daily.     losartan (COZAAR) 100 MG tablet Take 1 tablet (100 mg total) by mouth daily. 90 tablet 1   Nebivolol  HCl 20 MG TABS TAKE 1 TABLET BY MOUTH DAILY ** DISCONTINUE CARVEDILOL 25MG ** 90 tablet 3   NEURONTIN 300 MG capsule Take 600 mg by mouth at bedtime as needed (pain).     oxyCODONE-acetaminophen (PERCOCET/ROXICET) 5-325 MG tablet Take 1-2 tablets by mouth every 4 (four) hours as needed for moderate pain. 30 tablet 0   No current facility-administered medications on file prior to visit.     ROS see history of present illness  Objective  Physical Exam Vitals:   01/08/23 1444 01/08/23 1522  BP: (!) 142/87 128/70  Pulse: (!) 54   Temp: 98.3 F (36.8 C)   SpO2: 96%     BP Readings from Last 3 Encounters:  01/08/23 128/70  11/28/22 128/76  11/26/22 137/88   Wt Readings from Last 3 Encounters:  01/08/23 263 lb 9.6 oz (119.6 kg)  11/27/22 269 lb (122 kg)  11/26/22 274 lb 9.6 oz (124.6 kg)    Physical  Exam Constitutional:      General: He is not in acute distress.    Appearance: Normal appearance.  HENT:     Head: Normocephalic.  Cardiovascular:     Rate and Rhythm: Normal rate and regular rhythm.     Heart sounds: Normal heart sounds.  Pulmonary:     Effort: Pulmonary effort is normal.     Breath sounds: Normal breath sounds.  Skin:    General: Skin is warm and dry.  Neurological:     General: No focal deficit present.     Mental Status: He is alert.  Psychiatric:        Mood and Affect: Mood normal.        Behavior: Behavior normal.    Assessment/Plan: Please see individual problem list.  Primary hypertension Assessment & Plan: Chronic. Elevated reading x 1 in office, stable on second reading. Continue current medication regimen- Norvasc 10 mg daily, Losartan 100 mg daily, Nebivolol 20 mg daily, and Hydralazine 100 mg twice daily.    Hyperlipidemia, unspecified hyperlipidemia type Assessment & Plan: Chronic. Stable on Lipitor daily. Continue.   Depression, recurrent (HCC) Assessment & Plan: Chronic. Stopped Zoloft in April. Stable at this time on Wellbutrin XL 150 mg daily only. Continue. Discussed increasing Wellbutrin dose, he declined today. PHQ- 7 and GAD- 7. Denies SI/HI. Encouraged to contact if worsening symptoms, unusual behavior changes or suicidal thoughts occur.   Chronic midline low back pain without sciatica Assessment & Plan: PLIF L5-S1 on 11/24/2022. Healing well. Improvement in pain. Follow up with Neurosurgery as scheduled to discuss PT options.    Vitamin D deficiency Assessment & Plan: Chronic. Has been taking OTC daily supplement, would like Rx to see if his insurance will cover it. Rx for Vitamin D3 5,000 units daily sent to pharmacy.   Orders: -     Vitamin D3; Take 1 capsule (5,000 Units total) by mouth daily.  Dispense: 90 capsule; Refill: 3   Return in about 3 months (around 04/10/2023) for Follow up.   Bethanie Dicker, NP-C [place]  [facility] [employer]

## 2023-01-14 ENCOUNTER — Encounter: Payer: Self-pay | Admitting: Nurse Practitioner

## 2023-01-14 MED ORDER — VITAMIN D3 125 MCG (5000 UT) PO CAPS
5000.0000 [IU] | ORAL_CAPSULE | Freq: Every day | ORAL | 3 refills | Status: AC
Start: 2023-01-14 — End: ?

## 2023-01-14 NOTE — Assessment & Plan Note (Addendum)
Chronic. Elevated reading x 1 in office, stable on second reading. Continue current medication regimen- Norvasc 10 mg daily, Losartan 100 mg daily, Nebivolol 20 mg daily, and Hydralazine 100 mg twice daily.

## 2023-01-14 NOTE — Assessment & Plan Note (Signed)
Chronic. Stopped Zoloft in April. Stable at this time on Wellbutrin XL 150 mg daily only. Continue. Discussed increasing Wellbutrin dose, he declined today. PHQ- 7 and GAD- 7. Denies SI/HI. Encouraged to contact if worsening symptoms, unusual behavior changes or suicidal thoughts occur.

## 2023-01-14 NOTE — Assessment & Plan Note (Signed)
Chronic. Has been taking OTC daily supplement, would like Rx to see if his insurance will cover it. Rx for Vitamin D3 5,000 units daily sent to pharmacy.

## 2023-01-14 NOTE — Assessment & Plan Note (Signed)
Chronic. Stable on Lipitor daily. Continue.  

## 2023-01-14 NOTE — Assessment & Plan Note (Signed)
PLIF L5-S1 on 11/24/2022. Healing well. Improvement in pain. Follow up with Neurosurgery as scheduled to discuss PT options.

## 2023-02-17 DIAGNOSIS — M4317 Spondylolisthesis, lumbosacral region: Secondary | ICD-10-CM | POA: Diagnosis not present

## 2023-02-17 DIAGNOSIS — M542 Cervicalgia: Secondary | ICD-10-CM | POA: Diagnosis not present

## 2023-02-18 ENCOUNTER — Encounter (INDEPENDENT_AMBULATORY_CARE_PROVIDER_SITE_OTHER): Payer: Self-pay

## 2023-02-18 DIAGNOSIS — M79675 Pain in left toe(s): Secondary | ICD-10-CM | POA: Diagnosis not present

## 2023-02-18 DIAGNOSIS — B351 Tinea unguium: Secondary | ICD-10-CM | POA: Diagnosis not present

## 2023-02-18 DIAGNOSIS — M79674 Pain in right toe(s): Secondary | ICD-10-CM | POA: Diagnosis not present

## 2023-02-19 DIAGNOSIS — Z79891 Long term (current) use of opiate analgesic: Secondary | ICD-10-CM | POA: Diagnosis not present

## 2023-02-19 DIAGNOSIS — M25561 Pain in right knee: Secondary | ICD-10-CM | POA: Diagnosis not present

## 2023-02-19 DIAGNOSIS — M5412 Radiculopathy, cervical region: Secondary | ICD-10-CM | POA: Diagnosis not present

## 2023-02-19 DIAGNOSIS — M4726 Other spondylosis with radiculopathy, lumbar region: Secondary | ICD-10-CM | POA: Diagnosis not present

## 2023-02-19 DIAGNOSIS — M48062 Spinal stenosis, lumbar region with neurogenic claudication: Secondary | ICD-10-CM | POA: Diagnosis not present

## 2023-02-19 DIAGNOSIS — G894 Chronic pain syndrome: Secondary | ICD-10-CM | POA: Diagnosis not present

## 2023-02-23 DIAGNOSIS — G4733 Obstructive sleep apnea (adult) (pediatric): Secondary | ICD-10-CM | POA: Diagnosis not present

## 2023-02-23 DIAGNOSIS — I341 Nonrheumatic mitral (valve) prolapse: Secondary | ICD-10-CM | POA: Diagnosis not present

## 2023-02-23 DIAGNOSIS — I10 Essential (primary) hypertension: Secondary | ICD-10-CM | POA: Diagnosis not present

## 2023-02-23 DIAGNOSIS — E782 Mixed hyperlipidemia: Secondary | ICD-10-CM | POA: Diagnosis not present

## 2023-03-09 DIAGNOSIS — E538 Deficiency of other specified B group vitamins: Secondary | ICD-10-CM | POA: Diagnosis not present

## 2023-03-09 DIAGNOSIS — I69354 Hemiplegia and hemiparesis following cerebral infarction affecting left non-dominant side: Secondary | ICD-10-CM | POA: Diagnosis not present

## 2023-03-09 DIAGNOSIS — R4189 Other symptoms and signs involving cognitive functions and awareness: Secondary | ICD-10-CM | POA: Diagnosis not present

## 2023-03-09 DIAGNOSIS — G319 Degenerative disease of nervous system, unspecified: Secondary | ICD-10-CM | POA: Diagnosis not present

## 2023-03-10 ENCOUNTER — Encounter: Payer: Self-pay | Admitting: Nurse Practitioner

## 2023-03-10 ENCOUNTER — Ambulatory Visit (INDEPENDENT_AMBULATORY_CARE_PROVIDER_SITE_OTHER): Payer: Medicare Other | Admitting: Nurse Practitioner

## 2023-03-10 VITALS — BP 150/100 | HR 61 | Temp 97.8°F | Ht 68.0 in | Wt 267.8 lb

## 2023-03-10 DIAGNOSIS — J309 Allergic rhinitis, unspecified: Secondary | ICD-10-CM | POA: Diagnosis not present

## 2023-03-10 DIAGNOSIS — F339 Major depressive disorder, recurrent, unspecified: Secondary | ICD-10-CM | POA: Diagnosis not present

## 2023-03-10 DIAGNOSIS — I10 Essential (primary) hypertension: Secondary | ICD-10-CM | POA: Diagnosis not present

## 2023-03-10 DIAGNOSIS — M545 Low back pain, unspecified: Secondary | ICD-10-CM | POA: Diagnosis not present

## 2023-03-10 DIAGNOSIS — G8929 Other chronic pain: Secondary | ICD-10-CM

## 2023-03-10 MED ORDER — CETIRIZINE HCL 10 MG PO TABS
10.0000 mg | ORAL_TABLET | Freq: Every day | ORAL | 11 refills | Status: DC
Start: 2023-03-10 — End: 2023-07-29

## 2023-03-10 MED ORDER — BUPROPION HCL ER (XL) 300 MG PO TB24
300.0000 mg | ORAL_TABLET | Freq: Every day | ORAL | 1 refills | Status: DC
Start: 2023-03-10 — End: 2023-08-11

## 2023-03-10 MED ORDER — FLUTICASONE PROPIONATE 50 MCG/ACT NA SUSP
2.0000 | Freq: Every day | NASAL | 5 refills | Status: DC
Start: 2023-03-10 — End: 2023-06-10

## 2023-03-10 NOTE — Progress Notes (Signed)
Bethanie Dicker, NP-C Phone: [phone]  Jake Brown is a 56 y.o. male who presents today for follow up.   Allergies- Worsening allergy symptoms recently. Nasal congestion and runny nose x 2 days. Denies cough. Denies sore throat. Denies fevers/chills.   HYPERTENSION Disease Monitoring Home BP Monitoring- 130s/70-80s Chest pain- No    Dyspnea- No Medications Compliance-  Norvasc, Losartan, Bystolic, Hydralazine. Lightheadedness-  No  Edema- No BMET    Component Value Date/Time   NA 137 11/10/2022 0734   NA 141 04/01/2022 1013   K 3.8 11/10/2022 0734   CL 99 11/10/2022 0734   CO2 31 11/10/2022 0734   GLUCOSE 95 11/10/2022 0734   BUN 16 11/10/2022 0734   BUN 19 04/01/2022 1013   CREATININE 1.35 11/10/2022 0734   CREATININE 1.45 (H) 08/13/2018 0802   CREATININE 1.45 (H) 08/13/2018 0802   CALCIUM 9.2 11/10/2022 0734   GFRNONAA 54 (L) 08/13/2018 0802   GFRAA 63 08/13/2018 0802   Depression- Increase in depression recently. He stopped his Zoloft in April and has only been taking Wellbutrin XL. He is requesting a referral to Behavioral Health to establish with a therapist. He feels that he has no one to talk to. He is open to increasing his Wellbutrin. Denies SI/HI.   Social History   Tobacco Use  Smoking Status Former   Current packs/day: 0.00   Average packs/day: 1 pack/day for 20.0 years (20.0 ttl pk-yrs)   Types: Cigarettes   Start date: 07/21/1973   Quit date: 07/21/1993   Years since quitting: 29.6  Smokeless Tobacco Former    Current Outpatient Medications on File Prior to Visit  Medication Sig Dispense Refill   amLODipine (NORVASC) 10 MG tablet Take 1 tablet (10 mg total) by mouth daily. Qd d/c 5 mg qd 90 tablet 3   aspirin EC 81 MG tablet Take 1 tablet (81 mg total) by mouth daily. 90 tablet 3   atorvastatin (LIPITOR) 40 MG tablet TAKE ONE TABLET BY MOUTH DAILY AT 6 P.M. 90 tablet 3   baclofen (LIORESAL) 10 MG tablet Take 10 mg by mouth 3 (three) times daily as  needed for muscle spasms.     Cholecalciferol (VITAMIN D3) 125 MCG (5000 UT) CAPS Take 1 capsule (5,000 Units total) by mouth daily. 90 capsule 3   fentaNYL (DURAGESIC) 12 MCG/HR Place 1 patch onto the skin every 3 (three) days.     hydrALAZINE (APRESOLINE) 100 MG tablet Take 100 mg by mouth 2 (two) times daily.     losartan (COZAAR) 100 MG tablet Take 1 tablet (100 mg total) by mouth daily. 90 tablet 1   Nebivolol HCl 20 MG TABS TAKE 1 TABLET BY MOUTH DAILY ** DISCONTINUE CARVEDILOL 25MG ** 90 tablet 3   NEURONTIN 300 MG capsule Take 600 mg by mouth at bedtime as needed (pain).     oxyCODONE-acetaminophen (PERCOCET/ROXICET) 5-325 MG tablet Take 1-2 tablets by mouth every 4 (four) hours as needed for moderate pain. 30 tablet 0   No current facility-administered medications on file prior to visit.    ROS see history of present illness  Objective  Physical Exam Vitals:   03/10/23 1109  BP: (!) 150/100  Pulse: 61  Temp: 97.8 F (36.6 C)  SpO2: 96%    BP Readings from Last 3 Encounters:  03/10/23 (!) 150/100  01/08/23 128/70  11/28/22 128/76   Wt Readings from Last 3 Encounters:  03/10/23 267 lb 12.8 oz (121.5 kg)  01/08/23 263 lb 9.6 oz (119.6  kg)  11/27/22 269 lb (122 kg)    Physical Exam Constitutional:      General: He is not in acute distress.    Appearance: Normal appearance.  HENT:     Head: Normocephalic.     Right Ear: Tympanic membrane normal.     Left Ear: Tympanic membrane normal.     Nose: Rhinorrhea present.     Mouth/Throat:     Mouth: Mucous membranes are moist.     Pharynx: Oropharynx is clear.  Eyes:     Conjunctiva/sclera: Conjunctivae normal.     Pupils: Pupils are equal, round, and reactive to light.  Cardiovascular:     Rate and Rhythm: Normal rate and regular rhythm.     Heart sounds: Normal heart sounds.  Pulmonary:     Effort: Pulmonary effort is normal.     Breath sounds: Normal breath sounds.  Lymphadenopathy:     Cervical: No cervical  adenopathy.  Skin:    General: Skin is warm and dry.  Neurological:     General: No focal deficit present.     Mental Status: He is alert.  Psychiatric:        Mood and Affect: Mood normal.        Behavior: Behavior normal.    Assessment/Plan: Please see individual problem list.  Depression, recurrent (HCC) Assessment & Plan: Chronic. Worsening depression. Will increase Wellbutrin XL to 300 mg daily. Referral placed to Behavior Health for patient to establish with therapist. Denies SI/HI. Encouraged to contact if worsening symptoms, unusual behavior changes or suicidal thoughts occur. Will continue to monitor.   Orders: -     buPROPion HCl ER (XL); Take 1 tablet (300 mg total) by mouth daily.  Dispense: 90 tablet; Refill: 1 -     Ambulatory referral to Psychology  Allergic rhinitis, unspecified seasonality, unspecified trigger Assessment & Plan: Chronic. Worsening symptoms. Will start patient on Zyrtec 10 mg daily and Flonase nasal spray. Encouraged adequate fluid intake. He will contact if his symptoms are worsening or changing.   Orders: -     Cetirizine HCl; Take 1 tablet (10 mg total) by mouth daily.  Dispense: 30 tablet; Refill: 11 -     Fluticasone Propionate; Place 2 sprays into both nostrils daily.  Dispense: 16 g; Refill: 5  Primary hypertension Assessment & Plan: Chronic. Elevated reading x 1 in office, stable on second reading. Home blood pressures stable. Continue current medication regimen- Norvasc 10 mg daily, Losartan 100 mg daily, Nebivolol 20 mg daily, and Hydralazine 100 mg twice daily. Will continue to monitor.    Chronic midline low back pain without sciatica Assessment & Plan: s/p PLIF L5-S1 on 11/24/2022. Had follow up with surgeon, expecting 3-6 months for recovery. Planning to start physical therapy. Overall improvement in pain, mild pain when going from sitting to standing. Encouraged to follow up with surgeon as scheduled and begin physical therapy. Will  continue to monitor.     Return in about 3 months (around 06/10/2023) for Follow up.   Bethanie Dicker, NP-C [facility] - [employer]

## 2023-03-17 NOTE — Assessment & Plan Note (Signed)
s/p PLIF L5-S1 on 11/24/2022. Had follow up with surgeon, expecting 3-6 months for recovery. Planning to start physical therapy. Overall improvement in pain, mild pain when going from sitting to standing. Encouraged to follow up with surgeon as scheduled and begin physical therapy. Will continue to monitor.

## 2023-03-17 NOTE — Assessment & Plan Note (Signed)
Chronic. Elevated reading x 1 in office, stable on second reading. Home blood pressures stable. Continue current medication regimen- Norvasc 10 mg daily, Losartan 100 mg daily, Nebivolol 20 mg daily, and Hydralazine 100 mg twice daily. Will continue to monitor.

## 2023-03-17 NOTE — Assessment & Plan Note (Signed)
Chronic. Worsening symptoms. Will start patient on Zyrtec 10 mg daily and Flonase nasal spray. Encouraged adequate fluid intake. He will contact if his symptoms are worsening or changing.

## 2023-03-17 NOTE — Assessment & Plan Note (Signed)
Chronic. Worsening depression. Will increase Wellbutrin XL to 300 mg daily. Referral placed to Behavior Health for patient to establish with therapist. Denies SI/HI. Encouraged to contact if worsening symptoms, unusual behavior changes or suicidal thoughts occur. Will continue to monitor.

## 2023-03-27 DIAGNOSIS — I341 Nonrheumatic mitral (valve) prolapse: Secondary | ICD-10-CM | POA: Diagnosis not present

## 2023-03-27 DIAGNOSIS — I10 Essential (primary) hypertension: Secondary | ICD-10-CM | POA: Diagnosis not present

## 2023-03-27 DIAGNOSIS — G4733 Obstructive sleep apnea (adult) (pediatric): Secondary | ICD-10-CM | POA: Diagnosis not present

## 2023-03-27 DIAGNOSIS — E782 Mixed hyperlipidemia: Secondary | ICD-10-CM | POA: Diagnosis not present

## 2023-04-07 DIAGNOSIS — M4327 Fusion of spine, lumbosacral region: Secondary | ICD-10-CM | POA: Diagnosis not present

## 2023-04-07 DIAGNOSIS — M542 Cervicalgia: Secondary | ICD-10-CM | POA: Diagnosis not present

## 2023-04-07 DIAGNOSIS — M4317 Spondylolisthesis, lumbosacral region: Secondary | ICD-10-CM | POA: Diagnosis not present

## 2023-04-10 ENCOUNTER — Encounter: Payer: Self-pay | Admitting: Emergency Medicine

## 2023-04-10 ENCOUNTER — Emergency Department: Payer: Medicare Other

## 2023-04-10 ENCOUNTER — Other Ambulatory Visit: Payer: Self-pay

## 2023-04-10 ENCOUNTER — Emergency Department
Admission: EM | Admit: 2023-04-10 | Discharge: 2023-04-10 | Disposition: A | Discharge status: Home / Self Care | Payer: Medicare Other | Attending: Emergency Medicine | Admitting: Emergency Medicine

## 2023-04-10 DIAGNOSIS — R0789 Other chest pain: Secondary | ICD-10-CM | POA: Insufficient documentation

## 2023-04-10 DIAGNOSIS — N183 Chronic kidney disease, stage 3 unspecified: Secondary | ICD-10-CM | POA: Diagnosis not present

## 2023-04-10 DIAGNOSIS — I129 Hypertensive chronic kidney disease with stage 1 through stage 4 chronic kidney disease, or unspecified chronic kidney disease: Secondary | ICD-10-CM | POA: Diagnosis not present

## 2023-04-10 DIAGNOSIS — R079 Chest pain, unspecified: Secondary | ICD-10-CM

## 2023-04-10 LAB — CBC
HCT: 46.5 % (ref 39.0–52.0)
Hemoglobin: 15.4 g/dL (ref 13.0–17.0)
MCH: 29.3 pg (ref 26.0–34.0)
MCHC: 33.1 g/dL (ref 30.0–36.0)
MCV: 88.6 fL (ref 80.0–100.0)
Platelets: 240 10*3/uL (ref 150–400)
RBC: 5.25 MIL/uL (ref 4.22–5.81)
RDW: 13.2 % (ref 11.5–15.5)
WBC: 10.3 10*3/uL (ref 4.0–10.5)
nRBC: 0 % (ref 0.0–0.2)

## 2023-04-10 LAB — BASIC METABOLIC PANEL
Anion gap: 14 (ref 5–15)
BUN: 22 mg/dL — ABNORMAL HIGH (ref 6–20)
CO2: 24 mmol/L (ref 22–32)
Calcium: 9.3 mg/dL (ref 8.9–10.3)
Chloride: 102 mmol/L (ref 98–111)
Creatinine, Ser: 1.87 mg/dL — ABNORMAL HIGH (ref 0.61–1.24)
GFR, Estimated: 41 mL/min — ABNORMAL LOW (ref >60)
Glucose, Bld: 113 mg/dL — ABNORMAL HIGH (ref 70–99)
Potassium: 3.6 mmol/L (ref 3.5–5.1)
Sodium: 140 mmol/L (ref 135–145)

## 2023-04-10 LAB — TROPONIN I (HIGH SENSITIVITY)
Troponin I (High Sensitivity): 7 ng/L (ref <18)
Troponin I (High Sensitivity): 8 ng/L (ref <18)

## 2023-04-10 NOTE — Discharge Instructions (Addendum)
You were seen in the emergency department today for your chest pain symptoms.  Laboratory workup and EKG was reassuring for your heart at this time.  Chest x-ray reassuring as well.  With tenderness to palpation on the right side of the chest, may be some inflammation along the chest wall causing your symptoms.  Recommend Tylenol over the next several days.  Please follow-up with your cardiologist next week for reassessment.  Please return the emergency department if you have worsening chest pain that radiates towards her left side and is associated with either shortness of breath, nausea, or sweating.

## 2023-04-10 NOTE — ED Provider Notes (Signed)
[facility] Provider Note    Event Date/Time   First MD Initiated Contact with Patient 04/10/23 2054     (approximate)   History   Chest Pain   HPI NOELAN MESCH is a 57 y.o. male with HTN, CKD stage III presenting today for intermittent chest pain.  Patient notes around noon today he had onset of right-sided chest pain which he described as a twinge which would slowly come on and then slowly dissipate over a couple minutes.  Denied any pressure, sharpness, or burning sensation to it.  No obvious alleviating or aggravating factors.  Has not been constant since that time.  Otherwise denies any associated symptoms such as shortness of breath, diaphoresis, nausea, abdominal pain.  No other recent symptoms other than some sinus congestion.  No new leg swelling or leg pain.     Physical Exam   Triage Vital Signs: ED Triage Vitals  Encounter Vitals Group     BP 04/10/23 2001 (!) 169/136     Systolic BP Percentile --      Diastolic BP Percentile --      Pulse Rate 04/10/23 2001 (!) 112     Resp 04/10/23 2001 17     Temp 04/10/23 2001 98.6 F (37 C)     Temp Source 04/10/23 2001 Oral     SpO2 04/10/23 2001 94 %     Weight 04/10/23 1958 283 lb (128.4 kg)     Height --      Head Circumference --      Peak Flow --      Pain Score 04/10/23 1957 4     Pain Loc --      Pain Education --      Exclude from Growth Chart --     Most recent vital signs: Vitals:   04/10/23 2001 04/10/23 2200  BP: (!) 169/136 (!) 134/105  Pulse: (!) 112 78  Resp: 17 13  Temp: 98.6 F (37 C)   SpO2: 94% 95%   Physical Exam: I have reviewed the vital signs and nursing notes. General: Awake, alert, no acute distress.  Nontoxic appearing. Head:  Atraumatic, normocephalic.   ENT:  EOM intact, PERRL. Oral mucosa is pink and moist with no lesions. Neck: Neck is supple with full range of motion, No meningeal signs. Cardiovascular:  RRR, No murmurs. Peripheral pulses  palpable and equal bilaterally. Chest wall: Palpable chest wall tenderness on the right side. Respiratory:  Symmetrical chest wall expansion.  No rhonchi, rales, or wheezes.  Good air movement throughout.  No use of accessory muscles.   Musculoskeletal:  No cyanosis or edema. Moving extremities with full ROM Abdomen:  Soft, nontender, nondistended. Neuro:  GCS 15, moving all four extremities, interacting appropriately. Speech clear. Psych:  Calm, appropriate.   Skin:  Warm, dry, no rash.    ED Results / Procedures / Treatments   Labs (all labs ordered are listed, but only abnormal results are displayed) Labs Reviewed  BASIC METABOLIC PANEL - Abnormal; Notable for the following components:      Result Value   Glucose, Bld 113 (*)    BUN 22 (*)    Creatinine, Ser 1.87 (*)    GFR, Estimated 41 (*)    All other components within normal limits  CBC  TROPONIN I (HIGH SENSITIVITY)  TROPONIN I (HIGH SENSITIVITY)     EKG My EKG interpretation: Rate of 108, sinus tachycardia, normal axis, normal intervals.  No acute ST elevations  or depressions.   RADIOLOGY Independently interpreted chest x-ray with no acute pathology   PROCEDURES:  Critical Care performed: No  Procedures   MEDICATIONS ORDERED IN ED: Medications - No data to display   IMPRESSION / MDM / ASSESSMENT AND PLAN / ED COURSE  I reviewed the triage vital signs and the nursing notes.                              Differential diagnosis includes, but is not limited to, ACS, pneumonia, musculoskeletal pain, pneumothorax.  Patient's presentation is most consistent with acute complicated illness / injury requiring diagnostic workup.  Patient is a 57 year old male presenting today with vague intermittent right-sided chest pain.  He does have palpable chest wall tenderness on that side with known history of chronic arthritis throughout his body.  No chest pain symptoms throughout ED visit today.  He also does not have  any associated symptoms such as shortness of breath, nausea, or diaphoresis.  EKG reassuring.  Troponins negative x 2.  Chest x-ray negative and other laboratory workup reassuring.  No concern for ACS at this time without associated symptoms and normal troponins.  Suspect more likely musculoskeletal issue at this time.  Patient was safe for discharge and told to follow-up with cardiologist as needed for ongoing concerns.  He was given strict return precautions.  The patient is on the cardiac monitor to evaluate for evidence of arrhythmia and/or significant heart rate changes. Clinical Course as of 04/10/23 2313  Fri Apr 10, 2023  2119 Basic metabolic panel(!) Creatinine slightly elevated today but not a drastic increase from his baseline. [DW]  2119 Troponin I (High Sensitivity): 7 [DW]  2119 CBC Unremarkable [DW]  2159 DG Chest 2 View No acute cardiopulmonary abnormalities [DW]  2245 Troponin I (High Sensitivity): 8 Stable.  Symptoms today with intermittent twinge on the right side without associated symptoms not ACS in nature.  Will discharge patient with outpatient follow-up with cardiologist in the next week. [DW]  2252 Reassessed patient no pain symptoms at this time. [DW]    Clinical Course User Index [DW] Janith Lima, MD     FINAL CLINICAL IMPRESSION(S) / ED DIAGNOSES   Final diagnoses:  Right-sided chest pain     Rx / DC Orders   ED Discharge Orders     None        Note:  This document was prepared using Dragon voice recognition software and may include unintentional dictation errors.   Janith Lima, MD 04/10/23 [phone]

## 2023-04-10 NOTE — ED Triage Notes (Signed)
Pt in with dull, central cp onset while out shopping 4hrs ago. Denies any nausea, dizziness or sob. Pt took 3 baby ASA and took his late dose of bp med PTA

## 2023-04-13 DIAGNOSIS — M25561 Pain in right knee: Secondary | ICD-10-CM | POA: Diagnosis not present

## 2023-04-13 DIAGNOSIS — M48062 Spinal stenosis, lumbar region with neurogenic claudication: Secondary | ICD-10-CM | POA: Diagnosis not present

## 2023-04-13 DIAGNOSIS — M5412 Radiculopathy, cervical region: Secondary | ICD-10-CM | POA: Diagnosis not present

## 2023-04-15 ENCOUNTER — Ambulatory Visit (INDEPENDENT_AMBULATORY_CARE_PROVIDER_SITE_OTHER): Payer: Medicare Other | Admitting: Clinical

## 2023-04-15 DIAGNOSIS — F4320 Adjustment disorder, unspecified: Secondary | ICD-10-CM | POA: Diagnosis not present
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
## 2023-04-15 NOTE — Progress Notes (Signed)
                Dezi Schaner, LCSW 

## 2023-04-27 DIAGNOSIS — E782 Mixed hyperlipidemia: Secondary | ICD-10-CM | POA: Diagnosis not present

## 2023-04-27 DIAGNOSIS — R0789 Other chest pain: Secondary | ICD-10-CM | POA: Diagnosis not present

## 2023-04-27 DIAGNOSIS — I10 Essential (primary) hypertension: Secondary | ICD-10-CM | POA: Diagnosis not present

## 2023-04-27 DIAGNOSIS — I341 Nonrheumatic mitral (valve) prolapse: Secondary | ICD-10-CM | POA: Diagnosis not present

## 2023-05-11 DIAGNOSIS — M48062 Spinal stenosis, lumbar region with neurogenic claudication: Secondary | ICD-10-CM | POA: Diagnosis not present

## 2023-05-11 DIAGNOSIS — M5412 Radiculopathy, cervical region: Secondary | ICD-10-CM | POA: Diagnosis not present

## 2023-05-11 DIAGNOSIS — M25561 Pain in right knee: Secondary | ICD-10-CM | POA: Diagnosis not present

## 2023-05-13 ENCOUNTER — Ambulatory Visit: Payer: Medicare Other | Admitting: Clinical

## 2023-05-13 DIAGNOSIS — F4320 Adjustment disorder, unspecified: Secondary | ICD-10-CM

## 2023-05-13 NOTE — Progress Notes (Unsigned)
[facility] Counselor/Therapist Progress Note  Patient ID: Jake Brown, MRN: [id number],    Date: 05/13/2023  Time Spent: 12:34pm - 1:24pm : 50 minutes   Treatment Type: Individual Therapy  Reported Symptoms: Patient stated, "I would say that its interchangable easily" in response to patient's mood  Mental Status Exam: Appearance:  Neat and Well Groomed     Behavior: Appropriate  Motor: Normal  Speech/Language:  Clear and Coherent  Affect: Appropriate  Mood: normal  Thought process: normal  Thought content:   WNL  Sensory/Perceptual disturbances:   WNL  Orientation: oriented to person, place, and situation  Attention: Good  Concentration: Good  Memory: WNL  Fund of knowledge:  Good  Insight:   Fair  Judgment:  Good  Impulse Control: Good   Risk Assessment: Danger to Self:  No Patient denied current suicidal ideation  Self-injurious Behavior: No Danger to Others: No Patient denied current homicidal ideation Duty to Warn:no Physical Aggression / Violence:No  Access to Firearms a concern: No  Gang Involvement:No   Subjective: Patient reported he received medication management services in 2021 through a provider at [facility]. Patient reported a history of receiving medication management services with psychiatrist, Dr. Jomarie Longs. Patient reported a history of being prescribed multiple psychiatric medications. Patient reported a history of participation in individual therapy with Christian Hussami. Patient stated, "I've not been taking my medicines"  and reported he discontinued  Zoloft and Wellbutrin approximately 1 year ago. Patient reported his PCP prescribed the medications, Zoloft and Wellbutrin. Patient reported he would like to resume the medications and plans to call his PCP today to discuss resuming medications. Patient stated, "I would say that its interchangable easily" in response to patient's mood. Patient stated,  "I think that would be a  good thing" in response to a referral to a provider that specializes in treatment of trauma. Patient stated, "I would definitely like to improve my life".   Interventions: Clinician conducted session in person at clinician's office at [facility]. Clinician reviewed diagnosis and treatment recommendations. Provided psycho education related to diagnosis and treatment. Discussed patient following up with patient's PCP to discuss patient's inquiry related to resuming psychotropic mediations. Assessed patient's mood. Discussed clinician's scope of practice and discussed connecting patient with a provider that specializes in treatment of trauma. Clinician will initiate a referral to a provider that specializes in treatment of trauma.    Collaboration of Care: Other Discussed consents required for referral  Patient/Guardian was advised Release of Information must be obtained prior to any record release in order to collaborate their care with an outside provider.   Diagnosis:Adjustment disorder, unspecified type R/O Bipolar Disorder  Plan: Clinician will initiate a referral to a provider that specializes in treatment of trauma.    Doree Barthel, LCSW

## 2023-05-13 NOTE — Progress Notes (Unsigned)
                Dezi Schaner, LCSW 

## 2023-05-14 DIAGNOSIS — R0789 Other chest pain: Secondary | ICD-10-CM | POA: Diagnosis not present

## 2023-05-14 DIAGNOSIS — I341 Nonrheumatic mitral (valve) prolapse: Secondary | ICD-10-CM | POA: Diagnosis not present

## 2023-05-15 ENCOUNTER — Other Ambulatory Visit: Payer: Self-pay

## 2023-05-15 DIAGNOSIS — I10 Essential (primary) hypertension: Secondary | ICD-10-CM

## 2023-05-15 MED ORDER — NEBIVOLOL HCL 20 MG PO TABS
ORAL_TABLET | ORAL | 3 refills | Status: DC
Start: 2023-05-15 — End: 2023-07-31

## 2023-05-27 ENCOUNTER — Encounter: Payer: Self-pay | Admitting: Nurse Practitioner

## 2023-05-28 NOTE — Telephone Encounter (Signed)
Patient just called he said he would like to get his flu shot on November 20 th on his appointment. I added it in the appointment notes.

## 2023-06-02 ENCOUNTER — Other Ambulatory Visit: Payer: Self-pay

## 2023-06-02 DIAGNOSIS — E785 Hyperlipidemia, unspecified: Secondary | ICD-10-CM

## 2023-06-02 DIAGNOSIS — Z8673 Personal history of transient ischemic attack (TIA), and cerebral infarction without residual deficits: Secondary | ICD-10-CM

## 2023-06-02 MED ORDER — ATORVASTATIN CALCIUM 40 MG PO TABS
ORAL_TABLET | ORAL | 3 refills | Status: DC
Start: 2023-06-02 — End: 2024-05-18

## 2023-06-03 NOTE — Telephone Encounter (Signed)
Error

## 2023-06-08 DIAGNOSIS — M4327 Fusion of spine, lumbosacral region: Secondary | ICD-10-CM | POA: Diagnosis not present

## 2023-06-08 DIAGNOSIS — M4317 Spondylolisthesis, lumbosacral region: Secondary | ICD-10-CM | POA: Diagnosis not present

## 2023-06-08 DIAGNOSIS — M542 Cervicalgia: Secondary | ICD-10-CM | POA: Diagnosis not present

## 2023-06-10 ENCOUNTER — Encounter: Payer: Self-pay | Admitting: Nurse Practitioner

## 2023-06-10 ENCOUNTER — Ambulatory Visit: Payer: Medicare Other | Admitting: Nurse Practitioner

## 2023-06-10 VITALS — BP 140/88 | HR 56 | Temp 97.7°F | Ht 68.0 in | Wt 272.8 lb

## 2023-06-10 DIAGNOSIS — J309 Allergic rhinitis, unspecified: Secondary | ICD-10-CM | POA: Diagnosis not present

## 2023-06-10 DIAGNOSIS — F339 Major depressive disorder, recurrent, unspecified: Secondary | ICD-10-CM | POA: Diagnosis not present

## 2023-06-10 DIAGNOSIS — I10 Essential (primary) hypertension: Secondary | ICD-10-CM | POA: Diagnosis not present

## 2023-06-10 DIAGNOSIS — L989 Disorder of the skin and subcutaneous tissue, unspecified: Secondary | ICD-10-CM | POA: Insufficient documentation

## 2023-06-10 DIAGNOSIS — M542 Cervicalgia: Secondary | ICD-10-CM | POA: Diagnosis not present

## 2023-06-10 DIAGNOSIS — M4317 Spondylolisthesis, lumbosacral region: Secondary | ICD-10-CM | POA: Diagnosis not present

## 2023-06-10 DIAGNOSIS — Z23 Encounter for immunization: Secondary | ICD-10-CM

## 2023-06-10 DIAGNOSIS — M4327 Fusion of spine, lumbosacral region: Secondary | ICD-10-CM | POA: Diagnosis not present

## 2023-06-10 MED ORDER — AZELASTINE HCL 0.1 % NA SOLN
1.0000 | Freq: Two times a day (BID) | NASAL | 12 refills | Status: DC
Start: 2023-06-10 — End: 2023-07-29

## 2023-06-10 MED ORDER — HYDRALAZINE HCL 100 MG PO TABS
100.0000 mg | ORAL_TABLET | Freq: Three times a day (TID) | ORAL | 3 refills | Status: DC
Start: 2023-06-10 — End: 2023-07-27

## 2023-06-10 NOTE — Assessment & Plan Note (Signed)
A new, non-painful growth was noted on their left temple during a physical exam. They have an upcoming dermatology appointment in February. We advise them to avoid manipulating the growth and to follow up with dermatology as scheduled. A picture of the growth was taken for the chart. Return precautions given to patient.

## 2023-06-10 NOTE — Assessment & Plan Note (Signed)
They have a history of using Flonase but stopped due to concerns about steroids and blood pressure. We will prescribe Azelastine, a non-steroidal antihistamine nasal spray.

## 2023-06-10 NOTE — Assessment & Plan Note (Signed)
Despite increasing the dose of Wellbutrin to 300mg , their depression persists, compounded by inconsistent medication adherence. Previous counseling was not effective, likely due to unaddressed trauma-based issues. We will encourage them to take Wellbutrin XL 300mg  consistently every day and continue looking into trauma-based counseling, possibly [facility]. Encouraged to contact if worsening symptoms, unusual behavior changes or suicidal thoughts occur.

## 2023-06-10 NOTE — Progress Notes (Signed)
Bethanie Dicker, NP-C Phone: [phone]  Jake Brown is a 57 y.o. male who presents today for follow up.   Discussed the use of AI scribe software for clinical note transcription with the patient, who gave verbal consent to proceed.  History of Present Illness   The patient, with a history of depression, hypertension, and a single kidney, presents for a three-month follow-up. They report struggling with their mood despite an increase in Wellbutrin dosage from 150 to 300. They have been inconsistent in taking the medication. They have sought counseling, but the initial counselor recommended a different provider due to the patient's trauma-based needs. The patient is currently exploring other counseling options.  The patient also reports a new lump on the left side of their head, above the temple, which they noticed a couple of weeks ago. It is described as a non-painful growth, a couple of centimeters in size. They have an upcoming dermatology appointment in February.  Regarding their hypertension, the patient reports an average blood pressure of about 140/80, despite taking their prescribed medications. They have seen cardiology twice since the last visit and had an echocardiogram, the results of which are pending. They deny any chest pain, shortness of breath, lightheadedness or edema.  The patient also mentions weight gain and a sporadic diet, with a particular struggle with sugar and sweets. They acknowledge that their mood impacts their dietary choices. They have been advised to decrease salt intake due to their single kidney.      Social History   Tobacco Use  Smoking Status Former   Current packs/day: 0.00   Average packs/day: 1 pack/day for 20.0 years (20.0 ttl pk-yrs)   Types: Cigarettes   Start date: 07/21/1973   Quit date: 07/21/1993   Years since quitting: 29.9  Smokeless Tobacco Former    Current Outpatient Medications on File Prior to Visit  Medication Sig Dispense  Refill   amLODipine (NORVASC) 10 MG tablet Take 1 tablet (10 mg total) by mouth daily. Qd d/c 5 mg qd 90 tablet 3   aspirin EC 81 MG tablet Take 1 tablet (81 mg total) by mouth daily. 90 tablet 3   atorvastatin (LIPITOR) 40 MG tablet TAKE ONE TABLET BY MOUTH DAILY AT 6 P.M. 90 tablet 3   baclofen (LIORESAL) 10 MG tablet Take 10 mg by mouth 3 (three) times daily as needed for muscle spasms.     buPROPion (WELLBUTRIN XL) 300 MG 24 hr tablet Take 1 tablet (300 mg total) by mouth daily. 90 tablet 1   cetirizine (ZYRTEC) 10 MG tablet Take 1 tablet (10 mg total) by mouth daily. 30 tablet 11   Cholecalciferol (VITAMIN D3) 125 MCG (5000 UT) CAPS Take 1 capsule (5,000 Units total) by mouth daily. 90 capsule 3   fentaNYL (DURAGESIC) 12 MCG/HR Place 1 patch onto the skin every 3 (three) days.     losartan (COZAAR) 100 MG tablet Take 1 tablet (100 mg total) by mouth daily. 90 tablet 1   Nebivolol HCl 20 MG TABS TAKE 1 TABLET BY MOUTH DAILY ** DISCONTINUE CARVEDILOL 25MG ** 90 tablet 3   NEURONTIN 300 MG capsule Take 600 mg by mouth at bedtime as needed (pain).     oxyCODONE-acetaminophen (PERCOCET/ROXICET) 5-325 MG tablet Take 1-2 tablets by mouth every 4 (four) hours as needed for moderate pain. 30 tablet 0   No current facility-administered medications on file prior to visit.     ROS see history of present illness  Objective  Physical Exam Vitals:  06/10/23 1116 06/10/23 1141  BP: (!) 148/82 (!) 140/88  Pulse: (!) 56   Temp: 97.7 F (36.5 C)   SpO2: 98%     BP Readings from Last 3 Encounters:  06/10/23 (!) 140/88  04/10/23 (!) 158/113  03/10/23 (!) 150/100   Wt Readings from Last 3 Encounters:  06/10/23 272 lb 12.8 oz (123.7 kg)  04/10/23 283 lb (128.4 kg)  03/10/23 267 lb 12.8 oz (121.5 kg)    Physical Exam Constitutional:      General: He is not in acute distress.    Appearance: Normal appearance.  HENT:     Head: Normocephalic.      Comments: Lesion present on scalp.  Non-tender. Soft. No erythema or drainage. See picture below Cardiovascular:     Rate and Rhythm: Normal rate and regular rhythm.     Heart sounds: Normal heart sounds.  Pulmonary:     Effort: Pulmonary effort is normal.     Breath sounds: Normal breath sounds.  Skin:    General: Skin is warm and dry.  Neurological:     General: No focal deficit present.     Mental Status: He is alert.  Psychiatric:        Mood and Affect: Mood normal.        Behavior: Behavior normal.      Assessment/Plan: Please see individual problem list.  Primary hypertension Assessment & Plan: Their blood pressure readings have consistently been around 140/80, though they have been adherent to their antihypertensive medications. We will increase Hydralazine 100 mg to three times daily and continue Norvasc 10 mg daily, Losartan 100 mg daily, and Bystolic 20 mg daily. He will continue to monitor his blood pressure daily at home. Encouraged dietary modifications, specifically reducing salt intake. He will follow up with Cardiology as scheduled.   Orders: -     hydrALAZINE HCl; Take 1 tablet (100 mg total) by mouth 3 (three) times daily.  Dispense: 270 tablet; Refill: 3  Depression, recurrent (HCC) Assessment & Plan: Despite increasing the dose of Wellbutrin to 300mg , their depression persists, compounded by inconsistent medication adherence. Previous counseling was not effective, likely due to unaddressed trauma-based issues. We will encourage them to take Wellbutrin XL 300mg  consistently every day and continue looking into trauma-based counseling, possibly [facility]. Encouraged to contact if worsening symptoms, unusual behavior changes or suicidal thoughts occur.    Lesion of skin of scalp Assessment & Plan: A new, non-painful growth was noted on their left temple during a physical exam. They have an upcoming dermatology appointment in February. We advise them to avoid manipulating the growth and to follow up  with dermatology as scheduled. A picture of the growth was taken for the chart. Return precautions given to patient.    Allergic rhinitis, unspecified seasonality, unspecified trigger Assessment & Plan: They have a history of using Flonase but stopped due to concerns about steroids and blood pressure. We will prescribe Azelastine, a non-steroidal antihistamine nasal spray.   Orders: -     Azelastine HCl; Place 1-2 sprays into both nostrils 2 (two) times daily.  Dispense: 30 mL; Refill: 12  Need for influenza vaccination -     Flu vaccine trivalent PF, 6mos and older(Flulaval,Afluria,Fluarix,Fluzone)   Return in about 3 months (around 09/10/2023) for Follow up.   Bethanie Dicker, NP-C [facility] - [employer]

## 2023-06-10 NOTE — Assessment & Plan Note (Signed)
Their blood pressure readings have consistently been around 140/80, though they have been adherent to their antihypertensive medications. We will increase Hydralazine 100 mg to three times daily and continue Norvasc 10 mg daily, Losartan 100 mg daily, and Bystolic 20 mg daily. He will continue to monitor his blood pressure daily at home. Encouraged dietary modifications, specifically reducing salt intake. He will follow up with Cardiology as scheduled.

## 2023-06-19 DIAGNOSIS — M4327 Fusion of spine, lumbosacral region: Secondary | ICD-10-CM | POA: Diagnosis not present

## 2023-06-19 DIAGNOSIS — M4317 Spondylolisthesis, lumbosacral region: Secondary | ICD-10-CM | POA: Diagnosis not present

## 2023-06-19 DIAGNOSIS — M542 Cervicalgia: Secondary | ICD-10-CM | POA: Diagnosis not present

## 2023-06-29 DIAGNOSIS — M79674 Pain in right toe(s): Secondary | ICD-10-CM | POA: Diagnosis not present

## 2023-06-29 DIAGNOSIS — B351 Tinea unguium: Secondary | ICD-10-CM | POA: Diagnosis not present

## 2023-06-29 DIAGNOSIS — M79675 Pain in left toe(s): Secondary | ICD-10-CM | POA: Diagnosis not present

## 2023-07-06 DIAGNOSIS — M25561 Pain in right knee: Secondary | ICD-10-CM | POA: Diagnosis not present

## 2023-07-06 DIAGNOSIS — M48062 Spinal stenosis, lumbar region with neurogenic claudication: Secondary | ICD-10-CM | POA: Diagnosis not present

## 2023-07-06 DIAGNOSIS — M5412 Radiculopathy, cervical region: Secondary | ICD-10-CM | POA: Diagnosis not present

## 2023-07-10 DIAGNOSIS — M47812 Spondylosis without myelopathy or radiculopathy, cervical region: Secondary | ICD-10-CM | POA: Diagnosis not present

## 2023-07-10 DIAGNOSIS — M4317 Spondylolisthesis, lumbosacral region: Secondary | ICD-10-CM | POA: Diagnosis not present

## 2023-07-27 ENCOUNTER — Emergency Department
Admission: EM | Admit: 2023-07-27 | Discharge: 2023-07-27 | Disposition: A | Discharge status: Home / Self Care | Payer: Medicare Other | Attending: Emergency Medicine | Admitting: Emergency Medicine

## 2023-07-27 ENCOUNTER — Other Ambulatory Visit: Payer: Self-pay

## 2023-07-27 ENCOUNTER — Emergency Department: Payer: Medicare Other

## 2023-07-27 DIAGNOSIS — I77810 Thoracic aortic ectasia: Secondary | ICD-10-CM | POA: Diagnosis not present

## 2023-07-27 DIAGNOSIS — M25511 Pain in right shoulder: Secondary | ICD-10-CM | POA: Diagnosis not present

## 2023-07-27 DIAGNOSIS — J439 Emphysema, unspecified: Secondary | ICD-10-CM | POA: Diagnosis not present

## 2023-07-27 DIAGNOSIS — I129 Hypertensive chronic kidney disease with stage 1 through stage 4 chronic kidney disease, or unspecified chronic kidney disease: Secondary | ICD-10-CM | POA: Diagnosis not present

## 2023-07-27 DIAGNOSIS — N281 Cyst of kidney, acquired: Secondary | ICD-10-CM | POA: Diagnosis not present

## 2023-07-27 DIAGNOSIS — S2242XA Multiple fractures of ribs, left side, initial encounter for closed fracture: Secondary | ICD-10-CM | POA: Diagnosis not present

## 2023-07-27 DIAGNOSIS — R361 Hematospermia: Secondary | ICD-10-CM | POA: Diagnosis not present

## 2023-07-27 DIAGNOSIS — I10 Essential (primary) hypertension: Secondary | ICD-10-CM

## 2023-07-27 DIAGNOSIS — N189 Chronic kidney disease, unspecified: Secondary | ICD-10-CM | POA: Diagnosis not present

## 2023-07-27 DIAGNOSIS — R7989 Other specified abnormal findings of blood chemistry: Secondary | ICD-10-CM | POA: Insufficient documentation

## 2023-07-27 DIAGNOSIS — Z85528 Personal history of other malignant neoplasm of kidney: Secondary | ICD-10-CM | POA: Diagnosis not present

## 2023-07-27 DIAGNOSIS — Z8673 Personal history of transient ischemic attack (TIA), and cerebral infarction without residual deficits: Secondary | ICD-10-CM | POA: Insufficient documentation

## 2023-07-27 DIAGNOSIS — R778 Other specified abnormalities of plasma proteins: Secondary | ICD-10-CM | POA: Diagnosis not present

## 2023-07-27 DIAGNOSIS — Q600 Renal agenesis, unilateral: Secondary | ICD-10-CM | POA: Diagnosis not present

## 2023-07-27 DIAGNOSIS — R310 Gross hematuria: Secondary | ICD-10-CM | POA: Diagnosis not present

## 2023-07-27 LAB — T4, FREE: Free T4: 0.83 ng/dL (ref 0.61–1.12)

## 2023-07-27 LAB — BASIC METABOLIC PANEL
Anion gap: 15 (ref 5–15)
BUN: 25 mg/dL — ABNORMAL HIGH (ref 6–20)
CO2: 23 mmol/L (ref 22–32)
Calcium: 9.3 mg/dL (ref 8.9–10.3)
Chloride: 102 mmol/L (ref 98–111)
Creatinine, Ser: 1.43 mg/dL — ABNORMAL HIGH (ref 0.61–1.24)
GFR, Estimated: 56 mL/min — ABNORMAL LOW (ref >60)
Glucose, Bld: 122 mg/dL — ABNORMAL HIGH (ref 70–99)
Potassium: 4 mmol/L (ref 3.5–5.1)
Sodium: 140 mmol/L (ref 135–145)

## 2023-07-27 LAB — CBC
HCT: 50.2 % (ref 39.0–52.0)
Hemoglobin: 16.7 g/dL (ref 13.0–17.0)
MCH: 30.3 pg (ref 26.0–34.0)
MCHC: 33.3 g/dL (ref 30.0–36.0)
MCV: 90.9 fL (ref 80.0–100.0)
Platelets: 260 10*3/uL (ref 150–400)
RBC: 5.52 MIL/uL (ref 4.22–5.81)
RDW: 14 % (ref 11.5–15.5)
WBC: 12.4 10*3/uL — ABNORMAL HIGH (ref 4.0–10.5)
nRBC: 0 % (ref 0.0–0.2)

## 2023-07-27 LAB — TROPONIN I (HIGH SENSITIVITY)
Troponin I (High Sensitivity): 43 ng/L — ABNORMAL HIGH (ref <18)
Troponin I (High Sensitivity): 59 ng/L — ABNORMAL HIGH (ref <18)

## 2023-07-27 LAB — TSH: TSH: 1.225 u[IU]/mL (ref 0.350–4.500)

## 2023-07-27 MED ORDER — SODIUM CHLORIDE 0.9 % IV BOLUS
1000.0000 mL | Freq: Once | INTRAVENOUS | Status: AC
  Administered 2023-07-27: 1000 mL via INTRAVENOUS

## 2023-07-27 MED ORDER — IOHEXOL 350 MG/ML SOLN
100.0000 mL | Freq: Once | INTRAVENOUS | Status: AC | PRN
Start: 2023-07-27 — End: 2023-07-27
  Administered 2023-07-27: 100 mL via INTRAVENOUS

## 2023-07-27 MED ORDER — HYDRALAZINE HCL 100 MG PO TABS: 100.0000 mg | ORAL_TABLET | Freq: Three times a day (TID) | ORAL | 0 refills | Status: DC

## 2023-07-27 MED ORDER — LABETALOL HCL 5 MG/ML IV SOLN
20.0000 mg | Freq: Once | INTRAVENOUS | Status: AC
  Administered 2023-07-27: 20 mg via INTRAVENOUS
  Filled 2023-07-27: qty 4

## 2023-07-27 MED ORDER — GABAPENTIN 300 MG PO CAPS: 300.0000 mg | ORAL_CAPSULE | Freq: Three times a day (TID) | ORAL | 0 refills | Status: DC

## 2023-07-27 NOTE — Discharge Instructions (Addendum)
 Be sure to take all of your blood pressure medications every day, including nebivolol  20mg  daily, losartan  100mg  daily, and hydralazine  100mg  three times a day.  Follow up with your doctor in 1 week for blood pressure recheck.  Thank you for the opportunity to take care of you in our Emergency Department. You have been diagnosed with high blood pressure, also known as hypertension. This means that the force of blood against the walls of your blood vessels called is too strong. It also means that your heart has to work harder to move the blood. High blood pressure usually has no symptoms, but over time, it can cause serious health problems such as Heart attack and heart failure Stroke Kidney disease and failure Vision loss With the help from your healthcare provider and some important life style changes, you can manage your blood pressure and protect your health. Please read the instructions provided on hypertension, how to manage it and how to check your blood pressure. Additionally, use the blood pressure log provided to record your blood pressures. Take the blood pressure log with you to your primary care doctor so that they can adjust your blood pressure medications if needed. Please read the instructions on follow-up appointment. Return to the ER or Call 911 right away if you have any of these symptoms: Chest pain or shortness of breath Severe headache Weakness, tingling, or numbness of your face, arms, or legs (especially on 1 side of the body) Sudden change in vision Confusion, trouble speaking, or trouble understanding speech

## 2023-07-27 NOTE — ED Notes (Signed)
 Patient discharged from ED by provider. Discharge instructions reviewed with patient and all questions answered. Patient ambulatory from ED in NAD.

## 2023-07-27 NOTE — ED Provider Notes (Signed)
 [facility] Provider Note    Event Date/Time   First MD Initiated Contact with Patient 07/27/23 2043     (approximate)   History   Chief Complaint: Hypertension   HPI  Jake Brown is a 58 y.o. male  with h/o chronic pain, prior strokes, CKD, solitary kidney, hypertension who is sent to the ED from his [facility] urology clinic due to elevated blood pressure.  Patient reports he was in his usual state of health, went to urology clinic for follow-up, and was found to have markedly elevated blood pressure of about 220/180.  He denies chest pain or shortness of breath but does report some shoulder pain in the right posterior shoulder that radiates into the right upper arm that has been ongoing for the past few weeks.  He assumes it is musculoskeletal, but denies any injury or any aggravating or alleviating factors.  No abdominal pain, back pain, paresthesias or motor weakness.  Denies thunderclap headaches.  He does report a history of renal cell carcinoma status post nephrectomy.          Physical Exam   Triage Vital Signs: ED Triage Vitals  Encounter Vitals Group     BP 07/27/23 1707 (!) 225/207     Systolic BP Percentile --      Diastolic BP Percentile --      Pulse Rate 07/27/23 1707 (!) 131     Resp 07/27/23 1707 20     Temp 07/27/23 1707 98.6 F (37 C)     Temp Source 07/27/23 1707 Oral     SpO2 07/27/23 1707 96 %     Weight 07/27/23 1712 262 lb (118.8 kg)     Height 07/27/23 1712 5' 8 (1.727 m)     Head Circumference --      Peak Flow --      Pain Score 07/27/23 1712 0     Pain Loc --      Pain Education --      Exclude from Growth Chart --     Most recent vital signs: Vitals:   07/27/23 2200 07/27/23 2226  BP: (!) 174/96 (!) 169/103  Pulse: 83 84  Resp: 16 18  Temp:    SpO2: 97% 97%    General: Awake, no distress.  CV:  Good peripheral perfusion.  Regular rate rhythm.  Symmetric distal pulses Resp:  Normal effort.   Clear to auscultation bilaterally Abd:  No distention.  Other:  No lower extremity edema.   ED Results / Procedures / Treatments   Labs (all labs ordered are listed, but only abnormal results are displayed) Labs Reviewed  BASIC METABOLIC PANEL - Abnormal; Notable for the following components:      Result Value   Glucose, Bld 122 (*)    BUN 25 (*)    Creatinine, Ser 1.43 (*)    GFR, Estimated 56 (*)    All other components within normal limits  CBC - Abnormal; Notable for the following components:   WBC 12.4 (*)    All other components within normal limits  TROPONIN I (HIGH SENSITIVITY) - Abnormal; Notable for the following components:   Troponin I (High Sensitivity) 43 (*)    All other components within normal limits  TROPONIN I (HIGH SENSITIVITY) - Abnormal; Notable for the following components:   Troponin I (High Sensitivity) 59 (*)    All other components within normal limits  T4, FREE  TSH     EKG Interpreted by  me Sinus tachycardia rate 111.  Right axis, normal intervals.  Poor R wave progression.  Partial left bundle branch block.  No acute ischemic changes.   RADIOLOGY Chest x-ray interpreted by me, appears unremarkable.  Radiology report reviewed   PROCEDURES:  Procedures   MEDICATIONS ORDERED IN ED: Medications  sodium chloride  0.9 % bolus 1,000 mL (0 mLs Intravenous Stopped 07/27/23 2212)  labetalol  (NORMODYNE ) injection 20 mg (20 mg Intravenous Given 07/27/23 2119)  iohexol  (OMNIPAQUE ) 350 MG/ML injection 100 mL (100 mLs Intravenous Contrast Given 07/27/23 2142)     IMPRESSION / MDM / ASSESSMENT AND PLAN / ED COURSE  I reviewed the triage vital signs and the nursing notes.  DDx: NSTEMI, aortic dissection, hyperthyroidism, electrolyte derangement, AKI  Patient's presentation is most consistent with acute presentation with potential threat to life or bodily function.  Patient presents with severe uncontrolled hypertension, blood pressure of about  220/150.  Does not have any acute severe symptoms, but his posterior right shoulder pain that is radiating into the arm for the last few weeks is worrisome for possible dissection.  Despite his solitary kidney, will need to obtain an angiogram to rule out a potential life-threatening condition.    ----------------------------------------- 10:45 PM on 07/27/2023 ----------------------------------------- Blood pressure and heart rate improved after IV labetalol  and fluids.  Suspect his tachycardia is due to skipping his Bystolic  today and rebound effect.  First troponin is slightly elevated which was attributable to the marked hypertension, repeat demonstrates a flat trend.  TSH is normal.  CT angiogram is unremarkable.  Counseled patient on taking his medications every day as prescribed.  There is also discrepancy between hydralazine  prescription of 100 3 times daily shown in the computer and the patient's printed prescription bottle which shows 100 twice daily.  I have modified his prescription again to the 100 3 times daily for better blood pressure control.       FINAL CLINICAL IMPRESSION(S) / ED DIAGNOSES   Final diagnoses:  Uncontrolled hypertension  Elevated troponin     Rx / DC Orders   ED Discharge Orders          Ordered    gabapentin  (NEURONTIN ) 300 MG capsule  3 times daily,   Status:  Discontinued        07/27/23 2234    hydrALAZINE  (APRESOLINE ) 100 MG tablet  3 times daily        07/27/23 2234    Ambulatory referral to Cardiology       Comments: If you have not heard from the Cardiology office within the next 72 hours please call [phone].   07/27/23 2235    gabapentin  (NEURONTIN ) 300 MG capsule  3 times daily        07/27/23 2242             Note:  This document was prepared using Dragon voice recognition software and may include unintentional dictation errors.   Viviann Pastor, MD 07/27/23 2248

## 2023-07-27 NOTE — ED Triage Notes (Signed)
 Pt sts that he was at his urologist in [place] and has high bp. Pt sts that he takes meds for it daily.

## 2023-07-27 NOTE — ED Provider Triage Note (Signed)
 Emergency Medicine Provider Triage Evaluation Note  TARANCE BALAN , a 58 y.o. male  was evaluated in triage.  Pt complains of elevated BP today at his urologist. Hx of HTN. Complaint with medications. Pt is anxious and tearful in triage. Asymptomatic at this time.   Review of Systems  Positive:  Negative:   Physical Exam  BP (!) 225/207 (BP Location: Left Arm)   Pulse (!) 131   Temp 98.6 F (37 C) (Oral)   Resp 20   Ht 5' 8 (1.727 m)   Wt 118.8 kg   SpO2 96%   BMI 39.84 kg/m  Gen:   Awake, no distress   Resp:  Normal effort LCTAB MSK:   Moves extremities without difficulty  Other:    Medical Decision Making  Medically screening exam initiated at 5:16 PM.  Appropriate orders placed.  Dewain Platz Martensen was informed that the remainder of the evaluation will be completed by another provider, this initial triage assessment does not replace that evaluation, and the importance of remaining in the ED until their evaluation is complete.    Margrette, Harlo Jaso A, PA-C 07/27/23 1723

## 2023-07-28 ENCOUNTER — Telehealth: Payer: Self-pay

## 2023-07-28 ENCOUNTER — Emergency Department: Payer: Medicare Other

## 2023-07-28 ENCOUNTER — Other Ambulatory Visit: Payer: Self-pay

## 2023-07-28 ENCOUNTER — Emergency Department
Admission: EM | Admit: 2023-07-28 | Discharge: 2023-07-29 | Disposition: A | Discharge status: Other Acute Inpatient Hospital | Payer: Medicare Other | Attending: Student in an Organized Health Care Education/Training Program | Admitting: Student in an Organized Health Care Education/Training Program

## 2023-07-28 ENCOUNTER — Encounter: Payer: Self-pay | Admitting: *Deleted

## 2023-07-28 ENCOUNTER — Encounter (HOSPITAL_COMMUNITY): Payer: Self-pay

## 2023-07-28 DIAGNOSIS — I618 Other nontraumatic intracerebral hemorrhage: Secondary | ICD-10-CM | POA: Insufficient documentation

## 2023-07-28 DIAGNOSIS — R519 Headache, unspecified: Secondary | ICD-10-CM | POA: Diagnosis not present

## 2023-07-28 DIAGNOSIS — I10 Essential (primary) hypertension: Secondary | ICD-10-CM | POA: Insufficient documentation

## 2023-07-28 DIAGNOSIS — I614 Nontraumatic intracerebral hemorrhage in cerebellum: Secondary | ICD-10-CM | POA: Diagnosis not present

## 2023-07-28 DIAGNOSIS — I619 Nontraumatic intracerebral hemorrhage, unspecified: Secondary | ICD-10-CM | POA: Diagnosis not present

## 2023-07-28 DIAGNOSIS — Z79899 Other long term (current) drug therapy: Secondary | ICD-10-CM | POA: Insufficient documentation

## 2023-07-28 DIAGNOSIS — Z85528 Personal history of other malignant neoplasm of kidney: Secondary | ICD-10-CM | POA: Diagnosis not present

## 2023-07-28 LAB — BASIC METABOLIC PANEL
Anion gap: 12 (ref 5–15)
BUN: 21 mg/dL — ABNORMAL HIGH (ref 6–20)
CO2: 24 mmol/L (ref 22–32)
Calcium: 9 mg/dL (ref 8.9–10.3)
Chloride: 102 mmol/L (ref 98–111)
Creatinine, Ser: 1.32 mg/dL — ABNORMAL HIGH (ref 0.61–1.24)
GFR, Estimated: >60 mL/min (ref >60)
Glucose, Bld: 121 mg/dL — ABNORMAL HIGH (ref 70–99)
Potassium: 3.8 mmol/L (ref 3.5–5.1)
Sodium: 138 mmol/L (ref 135–145)

## 2023-07-28 LAB — CBC
HCT: 48.1 % (ref 39.0–52.0)
Hemoglobin: 15.9 g/dL (ref 13.0–17.0)
MCH: 30.4 pg (ref 26.0–34.0)
MCHC: 33.1 g/dL (ref 30.0–36.0)
MCV: 92 fL (ref 80.0–100.0)
Platelets: 249 10*3/uL (ref 150–400)
RBC: 5.23 MIL/uL (ref 4.22–5.81)
RDW: 14.3 % (ref 11.5–15.5)
WBC: 10 10*3/uL (ref 4.0–10.5)
nRBC: 0 % (ref 0.0–0.2)

## 2023-07-28 LAB — CBC WITH DIFFERENTIAL/PLATELET
Abs Immature Granulocytes: 0.04 10*3/uL (ref 0.00–0.07)
Basophils Absolute: 0.1 10*3/uL (ref 0.0–0.1)
Basophils Relative: 1 %
Eosinophils Absolute: 0.2 10*3/uL (ref 0.0–0.5)
Eosinophils Relative: 2 %
HCT: 50.1 % (ref 39.0–52.0)
Hemoglobin: 16.4 g/dL (ref 13.0–17.0)
Immature Granulocytes: 0 %
Lymphocytes Relative: 23 %
Lymphs Abs: 2.2 10*3/uL (ref 0.7–4.0)
MCH: 30.1 pg (ref 26.0–34.0)
MCHC: 32.7 g/dL (ref 30.0–36.0)
MCV: 91.9 fL (ref 80.0–100.0)
Monocytes Absolute: 0.7 10*3/uL (ref 0.1–1.0)
Monocytes Relative: 7 %
Neutro Abs: 6.5 10*3/uL (ref 1.7–7.7)
Neutrophils Relative %: 67 %
Platelets: 270 10*3/uL (ref 150–400)
RBC: 5.45 MIL/uL (ref 4.22–5.81)
RDW: 14.5 % (ref 11.5–15.5)
WBC: 9.6 10*3/uL (ref 4.0–10.5)
nRBC: 0 % (ref 0.0–0.2)

## 2023-07-28 LAB — TROPONIN I (HIGH SENSITIVITY): Troponin I (High Sensitivity): 29 ng/L — ABNORMAL HIGH (ref <18)

## 2023-07-28 LAB — PROTIME-INR
INR: 1 (ref 0.8–1.2)
Prothrombin Time: 13.3 s (ref 11.4–15.2)

## 2023-07-28 LAB — APTT: aPTT: 28 s (ref 24–36)

## 2023-07-28 MED ORDER — LOSARTAN POTASSIUM 50 MG PO TABS
50.0000 mg | ORAL_TABLET | Freq: Every day | ORAL | Status: DC
  Administered 2023-07-28: 50 mg via ORAL
  Filled 2023-07-28: qty 1

## 2023-07-28 MED ORDER — NICARDIPINE HCL IN NACL 20-0.86 MG/200ML-% IV SOLN
0.0000 mg/h | INTRAVENOUS | Status: DC
  Administered 2023-07-28: 5 mg/h via INTRAVENOUS
  Filled 2023-07-28: qty 200

## 2023-07-28 MED ORDER — LABETALOL HCL 5 MG/ML IV SOLN
5.0000 mg | Freq: Once | INTRAVENOUS | Status: AC
  Administered 2023-07-28: 5 mg via INTRAVENOUS
  Filled 2023-07-28: qty 4

## 2023-07-28 MED ORDER — CLEVIDIPINE BUTYRATE 0.5 MG/ML IV EMUL
0.0000 mg/h | INTRAVENOUS | Status: DC
  Filled 2023-07-28: qty 50

## 2023-07-28 NOTE — ED Triage Notes (Signed)
 Pt ambulatory to triage.  Pt reports elevated blood pressure.  Pt was seen here yesterday for similar sx.  Pt has a headache.  Pt alert  speech clear.

## 2023-07-28 NOTE — Transitions of Care (Post Inpatient/ED Visit) (Signed)
 07/28/2023  Name: Jake Brown MRN: [id number] DOB: 05/01/1966  Today's TOC FU Call Status: Today's TOC FU Call Status:: Successful TOC FU Call Completed TOC FU Call Complete Date: 07/28/23 Patient's Name and Date of Birth confirmed.  Transition Care Management Follow-up Telephone Call Date of Discharge: 07/27/23 Discharge Facility: [facility]) Type of Discharge: Emergency Department Reason for ED Visit: Other: (hypertension) How have you been since you were released from the hospital?: Better Any questions or concerns?: No  Items Reviewed: Did you receive and understand the discharge instructions provided?: Yes Medications obtained,verified, and reconciled?: Yes (Medications Reviewed) Any new allergies since your discharge?: No Dietary orders reviewed?: Yes Do you have support at home?: Yes People in Home: friend(s)  Medications Reviewed Today: Medications Reviewed Today     Reviewed by Emmitt Pan, LPN (Licensed Practical Nurse) on 07/28/23 at 1142  Med List Status: <None>   Medication Order Taking? Sig Documenting Provider Last Dose Status Informant  aspirin  EC 81 MG tablet [id number] No Take 1 tablet (81 mg total) by mouth daily. McLean-Scocuzza, Randine SAILOR, MD Past Week Active Self  atorvastatin  (LIPITOR ) 40 MG tablet [id number]  TAKE ONE TABLET BY MOUTH DAILY AT 6 P.M. Gretel App, NP  Active   azelastine  (ASTELIN ) 0.1 % nasal spray [id number]  Place 1-2 sprays into both nostrils 2 (two) times daily. Gretel App, NP  Active   baclofen  (LIORESAL ) 10 MG tablet [id number] No Take 10 mg by mouth 3 (three) times daily as needed for muscle spasms. [provider] 11/26/2022 Active Self  buPROPion  (WELLBUTRIN  XL) 300 MG 24 hr tablet [id number]  Take 1 tablet (300 mg total) by mouth daily. Gretel App, NP  Active   cetirizine  (ZYRTEC ) 10 MG tablet [id number]  Take 1 tablet (10 mg total) by mouth daily. Gretel App, NP  Active    Cholecalciferol  (VITAMIN D3) 125 MCG (5000 UT) CAPS [id number]  Take 1 capsule (5,000 Units total) by mouth daily. Gretel App, NP  Active   fentaNYL  (DURAGESIC ) 12 MCG/HR [id number] No Place 1 patch onto the skin every 3 (three) days. [provider] Taking Active Self  gabapentin  (NEURONTIN ) 300 MG capsule [id number]  Take 1 capsule (300 mg total) by mouth 3 (three) times daily. Viviann Pastor, MD  Active   hydrALAZINE  (APRESOLINE ) 100 MG tablet [id number]  Take 1 tablet (100 mg total) by mouth 3 (three) times daily. Viviann Pastor, MD  Active   losartan  (COZAAR ) 100 MG tablet [id number] No Take 1 tablet (100 mg total) by mouth daily. Gretel App, NP 11/26/2022 Active   Nebivolol  HCl 20 MG TABS [id number]  TAKE 1 TABLET BY MOUTH DAILY ** DISCONTINUE CARVEDILOL  25MG THORA Gretel, App, NP  Active   oxyCODONE -acetaminophen  (PERCOCET/ROXICET) 5-325 MG tablet [id number]  Take 1-2 tablets by mouth every 4 (four) hours as needed for moderate pain. Mavis Purchase, MD  Active             Home Care and Equipment/Supplies: Were Home Health Services Ordered?: NA Any new equipment or medical supplies ordered?: NA  Functional Questionnaire: Do you need assistance with bathing/showering or dressing?: No Do you need assistance with meal preparation?: No Do you need assistance with eating?: No Do you have difficulty maintaining continence: No Do you need assistance with getting out of bed/getting out of a chair/moving?: No Do you have difficulty managing or taking your medications?: No  Follow up appointments reviewed: PCP Follow-up appointment confirmed?: No (no avail appts, sent message to staff to  schedule) MD Provider Line [phone] Given: No Specialist Hospital Follow-up appointment confirmed?: No Reason Specialist Follow-Up Not Confirmed: Patient has Specialist Provider Number and will Call for Appointment Do you need transportation to your follow-up appointment?: No Do you  understand care options if your condition(s) worsen?: Yes-patient verbalized understanding    SIGNATURE Julian Lemmings, LPN [facility] Nurse Health Advisor Direct Dial [phone]

## 2023-07-28 NOTE — ED Notes (Signed)
 Pt admitted to [facility]

## 2023-07-28 NOTE — ED Provider Notes (Signed)
 [facility] Provider Note    Event Date/Time   First MD Initiated Contact with Patient 07/28/23 2128     (approximate)   History   Hypertension   HPI  Jake Brown is a 58 y.o. male with extensive history hypertension chronic back pain, history of CVA, solitary kidney status post renal cell carcinoma presents to the ER over concern for elevated blood pressure as well as headache today.  Patient was seen yesterday for elevated blood pressure.  Had extensive workup mildly elevated troponins but patient denied any chest pain.  He denies any chest pain today.  No shortness of breath.  No lower extremity swelling.  Further questioning patient states that he did miss his doses of Cozaar  yesterday and today.  He has taken his hydralazine .  He denies any numbness or tingling.     Physical Exam   Triage Vital Signs: ED Triage Vitals  Encounter Vitals Group     BP 07/28/23 1828 (!) 184/120     Systolic BP Percentile --      Diastolic BP Percentile --      Pulse Rate 07/28/23 1828 (!) 104     Resp 07/28/23 1828 18     Temp 07/28/23 1828 98.9 F (37.2 C)     Temp Source 07/28/23 1828 Oral     SpO2 07/28/23 1828 96 %     Weight 07/28/23 1825 262 lb (118.8 kg)     Height 07/28/23 1825 5' 8 (1.727 m)     Head Circumference --      Peak Flow --      Pain Score 07/28/23 1825 7     Pain Loc --      Pain Education --      Exclude from Growth Chart --     Most recent vital signs: Vitals:   07/28/23 2320 07/28/23 2329  BP:  (!) 169/101  Pulse: (!) 109 (!) 110  Resp:  18  Temp:    SpO2:  99%     Constitutional: Alert  Eyes: Conjunctivae are normal.  Head: Atraumatic. Nose: No congestion/rhinnorhea. Mouth/Throat: Mucous membranes are moist.   Neck: Painless ROM.  Cardiovascular:   Good peripheral circulation. Respiratory: Normal respiratory effort.  No retractions.  Gastrointestinal: Soft and nontender.  Musculoskeletal:  no  deformity Neurologic:  MAE spontaneously. No gross focal neurologic deficits are appreciated.  Skin:  Skin is warm, dry and intact. No rash noted. Psychiatric: Mood and affect are normal. Speech and behavior are normal.    ED Results / Procedures / Treatments   Labs (all labs ordered are listed, but only abnormal results are displayed) Labs Reviewed  BASIC METABOLIC PANEL - Abnormal; Notable for the following components:      Result Value   Glucose, Bld 121 (*)    BUN 21 (*)    Creatinine, Ser 1.32 (*)    All other components within normal limits  TROPONIN I (HIGH SENSITIVITY) - Abnormal; Notable for the following components:   Troponin I (High Sensitivity) 29 (*)    All other components within normal limits  CBC  ETHANOL  PROTIME-INR  APTT  DIFFERENTIAL  URINE DRUG SCREEN, QUALITATIVE ([facility] ONLY)  TROPONIN I (HIGH SENSITIVITY)     EKG   RADIOLOGY Please see ED Course for my review and interpretation.  I personally reviewed all radiographic images ordered to evaluate for the above acute complaints and reviewed radiology reports and findings.  These findings were personally discussed with  the patient.  Please see medical record for radiology report.    PROCEDURES:  Critical Care performed: Yes, see critical care procedure note(s)  .Critical Care  Performed by: Lang Dover, MD Authorized by: Lang Dover, MD   Critical care provider statement:    Critical care time (minutes):  40   Critical care was necessary to treat or prevent imminent or life-threatening deterioration of the following conditions:  CNS failure or compromise   Critical care was time spent personally by me on the following activities:  Ordering and performing treatments and interventions, ordering and review of laboratory studies, ordering and review of radiographic studies, pulse oximetry, re-evaluation of patient's condition, review of old charts, obtaining history from patient or  surrogate, examination of patient, evaluation of patient's response to treatment, discussions with primary provider, discussions with consultants and development of treatment plan with patient or surrogate    MEDICATIONS ORDERED IN ED: Medications  losartan  (COZAAR ) tablet 50 mg (50 mg Oral Given 07/28/23 2301)  clevidipine  (CLEVIPREX ) infusion 0.5 mg/mL (has no administration in time range)  labetalol  (NORMODYNE ) injection 5 mg (has no administration in time range)     IMPRESSION / MDM / ASSESSMENT AND PLAN / ED COURSE  I reviewed the triage vital signs and the nursing notes.                              Differential diagnosis includes, but is not limited to, hypertensive urgency, noncompliance, CVA, SAH, IPH, ACS  Patient presenting to the ER for evaluation of symptoms as described above.  Based on symptoms, risk factors and considered above differential, this presenting complaint could reflect a potentially life-threatening illness therefore the patient will be placed on continuous pulse oximetry and telemetry for monitoring.  Laboratory evaluation will be sent to evaluate for the above complaints.  Based on patient's hypertension and headache will order CT imaging to further evaluate.   Clinical Course as of 07/28/23 2331  Tue Jul 28, 2023  2326 Patient ct head my review and interpretation with small cerebellar ICH.  Remains clinically well-appearing but significantly hypertensive tachycardic.  Discussed case in consultation with Dr. Jerrie of neurology at [facility].  Has recommended initiation of Cleviprex  for blood pressure control.  Also will give labetalol  for mild tachycardia.  She has kindly accepted patient to neuro ICU at [facility].  They do have capacity.  Patient is agreeable to transfer.  Patient reiterates that he is not on any anticoagulation or antiplatelet therapy.  Platelets are normal. [PR]    Clinical Course User Index [PR] Lang Dover, MD     FINAL CLINICAL  IMPRESSION(S) / ED DIAGNOSES   Final diagnoses:  Intraparenchymal hemorrhage of brain (HCC)  Uncontrolled hypertension     Rx / DC Orders   ED Discharge Orders     None        Note:  This document was prepared using Dragon voice recognition software and may include unintentional dictation errors.    Lang Dover, MD 07/28/23 [phone]

## 2023-07-28 NOTE — ED Notes (Signed)
 Verbal report given to RN 4 N bed 29 at [phone].

## 2023-07-28 NOTE — ED Notes (Signed)
 Verbal report given to [facility]. Pt transported by this RN to room 1

## 2023-07-28 NOTE — Telephone Encounter (Signed)
 Pt scheduled

## 2023-07-29 ENCOUNTER — Inpatient Hospital Stay (HOSPITAL_COMMUNITY): Payer: Medicare Other

## 2023-07-29 ENCOUNTER — Inpatient Hospital Stay (HOSPITAL_COMMUNITY)
Admission: EM | Admit: 2023-07-29 | Discharge: 2023-07-31 | DRG: 065 | Disposition: A | Discharge status: Home / Self Care | Payer: Medicare Other | Source: Other Acute Inpatient Hospital | Attending: Neurology | Admitting: Neurology

## 2023-07-29 DIAGNOSIS — R519 Headache, unspecified: Secondary | ICD-10-CM | POA: Diagnosis not present

## 2023-07-29 DIAGNOSIS — Z85528 Personal history of other malignant neoplasm of kidney: Secondary | ICD-10-CM | POA: Diagnosis not present

## 2023-07-29 DIAGNOSIS — Z6839 Body mass index (BMI) 39.0-39.9, adult: Secondary | ICD-10-CM

## 2023-07-29 DIAGNOSIS — I952 Hypotension due to drugs: Secondary | ICD-10-CM | POA: Diagnosis not present

## 2023-07-29 DIAGNOSIS — G8929 Other chronic pain: Secondary | ICD-10-CM | POA: Diagnosis present

## 2023-07-29 DIAGNOSIS — F419 Anxiety disorder, unspecified: Secondary | ICD-10-CM | POA: Diagnosis not present

## 2023-07-29 DIAGNOSIS — I129 Hypertensive chronic kidney disease with stage 1 through stage 4 chronic kidney disease, or unspecified chronic kidney disease: Secondary | ICD-10-CM | POA: Diagnosis present

## 2023-07-29 DIAGNOSIS — Z79891 Long term (current) use of opiate analgesic: Secondary | ICD-10-CM

## 2023-07-29 DIAGNOSIS — I161 Hypertensive emergency: Secondary | ICD-10-CM

## 2023-07-29 DIAGNOSIS — F4310 Post-traumatic stress disorder, unspecified: Secondary | ICD-10-CM | POA: Diagnosis present

## 2023-07-29 DIAGNOSIS — Z79899 Other long term (current) drug therapy: Secondary | ICD-10-CM

## 2023-07-29 DIAGNOSIS — M1990 Unspecified osteoarthritis, unspecified site: Secondary | ICD-10-CM | POA: Diagnosis not present

## 2023-07-29 DIAGNOSIS — Z8052 Family history of malignant neoplasm of bladder: Secondary | ICD-10-CM

## 2023-07-29 DIAGNOSIS — I69354 Hemiplegia and hemiparesis following cerebral infarction affecting left non-dominant side: Secondary | ICD-10-CM | POA: Diagnosis not present

## 2023-07-29 DIAGNOSIS — E854 Organ-limited amyloidosis: Secondary | ICD-10-CM | POA: Diagnosis not present

## 2023-07-29 DIAGNOSIS — I6381 Other cerebral infarction due to occlusion or stenosis of small artery: Secondary | ICD-10-CM | POA: Diagnosis not present

## 2023-07-29 DIAGNOSIS — I6521 Occlusion and stenosis of right carotid artery: Secondary | ICD-10-CM | POA: Diagnosis not present

## 2023-07-29 DIAGNOSIS — F0393 Unspecified dementia, unspecified severity, with mood disturbance: Secondary | ICD-10-CM | POA: Diagnosis present

## 2023-07-29 DIAGNOSIS — I672 Cerebral atherosclerosis: Secondary | ICD-10-CM | POA: Diagnosis not present

## 2023-07-29 DIAGNOSIS — T465X5A Adverse effect of other antihypertensive drugs, initial encounter: Secondary | ICD-10-CM | POA: Diagnosis not present

## 2023-07-29 DIAGNOSIS — Z83438 Family history of other disorder of lipoprotein metabolism and other lipidemia: Secondary | ICD-10-CM

## 2023-07-29 DIAGNOSIS — Z905 Acquired absence of kidney: Secondary | ICD-10-CM

## 2023-07-29 DIAGNOSIS — Z87442 Personal history of urinary calculi: Secondary | ICD-10-CM

## 2023-07-29 DIAGNOSIS — E669 Obesity, unspecified: Secondary | ICD-10-CM | POA: Diagnosis present

## 2023-07-29 DIAGNOSIS — F319 Bipolar disorder, unspecified: Secondary | ICD-10-CM | POA: Diagnosis present

## 2023-07-29 DIAGNOSIS — Z8261 Family history of arthritis: Secondary | ICD-10-CM | POA: Diagnosis not present

## 2023-07-29 DIAGNOSIS — I614 Nontraumatic intracerebral hemorrhage in cerebellum: Principal | ICD-10-CM | POA: Diagnosis present

## 2023-07-29 DIAGNOSIS — F1210 Cannabis abuse, uncomplicated: Secondary | ICD-10-CM | POA: Diagnosis present

## 2023-07-29 DIAGNOSIS — Z820 Family history of epilepsy and other diseases of the nervous system: Secondary | ICD-10-CM

## 2023-07-29 DIAGNOSIS — Z87891 Personal history of nicotine dependence: Secondary | ICD-10-CM | POA: Diagnosis not present

## 2023-07-29 DIAGNOSIS — G4733 Obstructive sleep apnea (adult) (pediatric): Secondary | ICD-10-CM | POA: Diagnosis present

## 2023-07-29 DIAGNOSIS — Z8249 Family history of ischemic heart disease and other diseases of the circulatory system: Secondary | ICD-10-CM

## 2023-07-29 DIAGNOSIS — R29701 NIHSS score 1: Secondary | ICD-10-CM | POA: Diagnosis present

## 2023-07-29 DIAGNOSIS — E785 Hyperlipidemia, unspecified: Secondary | ICD-10-CM | POA: Diagnosis present

## 2023-07-29 DIAGNOSIS — Z818 Family history of other mental and behavioral disorders: Secondary | ICD-10-CM

## 2023-07-29 DIAGNOSIS — R278 Other lack of coordination: Secondary | ICD-10-CM | POA: Diagnosis present

## 2023-07-29 DIAGNOSIS — Z23 Encounter for immunization: Secondary | ICD-10-CM

## 2023-07-29 DIAGNOSIS — N1831 Chronic kidney disease, stage 3a: Secondary | ICD-10-CM | POA: Diagnosis present

## 2023-07-29 DIAGNOSIS — R7989 Other specified abnormal findings of blood chemistry: Secondary | ICD-10-CM | POA: Diagnosis present

## 2023-07-29 LAB — CBC WITH DIFFERENTIAL/PLATELET
Abs Immature Granulocytes: 0.03 10*3/uL (ref 0.00–0.07)
Basophils Absolute: 0.1 10*3/uL (ref 0.0–0.1)
Basophils Relative: 1 %
Eosinophils Absolute: 0.2 10*3/uL (ref 0.0–0.5)
Eosinophils Relative: 3 %
HCT: 43.5 % (ref 39.0–52.0)
Hemoglobin: 14.4 g/dL (ref 13.0–17.0)
Immature Granulocytes: 0 %
Lymphocytes Relative: 25 %
Lymphs Abs: 2 10*3/uL (ref 0.7–4.0)
MCH: 29.9 pg (ref 26.0–34.0)
MCHC: 33.1 g/dL (ref 30.0–36.0)
MCV: 90.2 fL (ref 80.0–100.0)
Monocytes Absolute: 0.5 10*3/uL (ref 0.1–1.0)
Monocytes Relative: 7 %
Neutro Abs: 5.3 10*3/uL (ref 1.7–7.7)
Neutrophils Relative %: 64 %
Platelets: 232 10*3/uL (ref 150–400)
RBC: 4.82 MIL/uL (ref 4.22–5.81)
RDW: 14.5 % (ref 11.5–15.5)
WBC: 8.2 10*3/uL (ref 4.0–10.5)
nRBC: 0 % (ref 0.0–0.2)

## 2023-07-29 LAB — RAPID URINE DRUG SCREEN, HOSP PERFORMED
Amphetamines: NOT DETECTED
Barbiturates: NOT DETECTED
Benzodiazepines: NOT DETECTED
Cocaine: NOT DETECTED
Opiates: POSITIVE — AB
Tetrahydrocannabinol: POSITIVE — AB

## 2023-07-29 LAB — COMPREHENSIVE METABOLIC PANEL
ALT: 19 U/L (ref 0–44)
AST: 20 U/L (ref 15–41)
Albumin: 3.3 g/dL — ABNORMAL LOW (ref 3.5–5.0)
Alkaline Phosphatase: 69 U/L (ref 38–126)
Anion gap: 11 (ref 5–15)
BUN: 18 mg/dL (ref 6–20)
CO2: 24 mmol/L (ref 22–32)
Calcium: 8.7 mg/dL — ABNORMAL LOW (ref 8.9–10.3)
Chloride: 101 mmol/L (ref 98–111)
Creatinine, Ser: 1.31 mg/dL — ABNORMAL HIGH (ref 0.61–1.24)
GFR, Estimated: >60 mL/min (ref >60)
Glucose, Bld: 97 mg/dL (ref 70–99)
Potassium: 3.5 mmol/L (ref 3.5–5.1)
Sodium: 136 mmol/L (ref 135–145)
Total Bilirubin: 1.2 mg/dL (ref 0.0–1.2)
Total Protein: 6.1 g/dL — ABNORMAL LOW (ref 6.5–8.1)

## 2023-07-29 LAB — ECHOCARDIOGRAM COMPLETE
AR max vel: 3.48 cm2
AV Peak grad: 6.4 mm[Hg]
Ao pk vel: 1.26 m/s
Area-P 1/2: 2.95 cm2
S' Lateral: 2.3 cm

## 2023-07-29 LAB — TROPONIN I (HIGH SENSITIVITY): Troponin I (High Sensitivity): 35 ng/L — ABNORMAL HIGH (ref <18)

## 2023-07-29 LAB — LIPID PANEL
Cholesterol: 159 mg/dL (ref 0–200)
HDL: 50 mg/dL (ref >40)
LDL Cholesterol: 89 mg/dL (ref 0–99)
Total CHOL/HDL Ratio: 3.2 {ratio}
Triglycerides: 100 mg/dL (ref <150)
VLDL: 20 mg/dL (ref 0–40)

## 2023-07-29 LAB — MRSA NEXT GEN BY PCR, NASAL: MRSA by PCR Next Gen: NOT DETECTED

## 2023-07-29 LAB — MAGNESIUM: Magnesium: 2.1 mg/dL (ref 1.7–2.4)

## 2023-07-29 LAB — ETHANOL: Alcohol, Ethyl (B): <10 mg/dL (ref <10)

## 2023-07-29 LAB — HIV ANTIBODY (ROUTINE TESTING W REFLEX): HIV Screen 4th Generation wRfx: NONREACTIVE

## 2023-07-29 MED ORDER — STROKE: EARLY STAGES OF RECOVERY BOOK
Freq: Once | Status: AC
  Filled 2023-07-29: qty 1

## 2023-07-29 MED ORDER — CHLORHEXIDINE GLUCONATE CLOTH 2 % EX PADS
6.0000 | MEDICATED_PAD | Freq: Every day | CUTANEOUS | Status: DC
  Administered 2023-07-29 – 2023-07-30 (×2): 6 via TOPICAL

## 2023-07-29 MED ORDER — NEBIVOLOL HCL 10 MG PO TABS
20.0000 mg | ORAL_TABLET | Freq: Every day | ORAL | Status: DC
  Administered 2023-07-29 – 2023-07-31 (×3): 20 mg via ORAL
  Filled 2023-07-29 (×3): qty 2

## 2023-07-29 MED ORDER — OXYCODONE HCL 5 MG PO TABS
5.0000 mg | ORAL_TABLET | ORAL | Status: DC | PRN
  Administered 2023-07-29 – 2023-07-31 (×6): 5 mg via ORAL
  Filled 2023-07-29 (×7): qty 1

## 2023-07-29 MED ORDER — ACETAMINOPHEN 160 MG/5ML PO SOLN
650.0000 mg | ORAL | Status: DC | PRN
Start: 2023-07-29 — End: 2023-07-31

## 2023-07-29 MED ORDER — HYDRALAZINE HCL 50 MG PO TABS
100.0000 mg | ORAL_TABLET | Freq: Three times a day (TID) | ORAL | Status: DC
  Administered 2023-07-29: 100 mg via ORAL
  Filled 2023-07-29: qty 2

## 2023-07-29 MED ORDER — HYDRALAZINE HCL 20 MG/ML IJ SOLN
10.0000 mg | INTRAMUSCULAR | Status: DC | PRN
  Administered 2023-07-30 – 2023-07-31 (×2): 10 mg via INTRAVENOUS
  Filled 2023-07-29 (×3): qty 1

## 2023-07-29 MED ORDER — ACETAMINOPHEN 325 MG PO TABS: 650.0000 mg | ORAL_TABLET | ORAL | Status: DC | PRN

## 2023-07-29 MED ORDER — LABETALOL HCL 5 MG/ML IV SOLN
10.0000 mg | INTRAVENOUS | Status: DC | PRN
  Administered 2023-07-29 – 2023-07-30 (×3): 10 mg via INTRAVENOUS
  Filled 2023-07-29 (×3): qty 4

## 2023-07-29 MED ORDER — PANTOPRAZOLE SODIUM 40 MG IV SOLR: 40.0000 mg | Freq: Every day | INTRAVENOUS | Status: DC

## 2023-07-29 MED ORDER — CLEVIDIPINE BUTYRATE 0.5 MG/ML IV EMUL
0.0000 mg/h | INTRAVENOUS | Status: DC
  Administered 2023-07-29: 4 mg/h via INTRAVENOUS
  Administered 2023-07-29: 2 mg/h via INTRAVENOUS
  Filled 2023-07-29: qty 50

## 2023-07-29 MED ORDER — IOHEXOL 350 MG/ML SOLN
75.0000 mL | Freq: Once | INTRAVENOUS | Status: AC | PRN
  Administered 2023-07-29: 75 mL via INTRAVENOUS

## 2023-07-29 MED ORDER — ACETAMINOPHEN 650 MG RE SUPP
650.0000 mg | RECTAL | Status: DC | PRN
Start: 2023-07-29 — End: 2023-07-31

## 2023-07-29 MED ORDER — SENNOSIDES-DOCUSATE SODIUM 8.6-50 MG PO TABS
1.0000 | ORAL_TABLET | Freq: Two times a day (BID) | ORAL | Status: DC
  Administered 2023-07-29 – 2023-07-31 (×6): 1 via ORAL
  Filled 2023-07-29 (×6): qty 1

## 2023-07-29 MED ORDER — HEPARIN SODIUM (PORCINE) 5000 UNIT/ML IJ SOLN
5000.0000 [IU] | Freq: Three times a day (TID) | INTRAMUSCULAR | Status: DC
  Administered 2023-07-30 – 2023-07-31 (×4): 5000 [IU] via SUBCUTANEOUS
  Filled 2023-07-29 (×4): qty 1

## 2023-07-29 NOTE — Progress Notes (Signed)
 Echocardiogram 2D Echocardiogram has been performed.  [name] 07/29/2023, 12:28 PM

## 2023-07-29 NOTE — Progress Notes (Signed)
 eLink Physician-Brief Progress Note Patient Name: Jake Brown DOB: 07/16/1966 MRN: [id number]   Date of Service  07/29/2023  HPI/Events of Note  58 year old male with a extensive history of hypertension, CVA, solitary kidney in the setting of previous renal cell carcinoma who presented to the emergency department with elevated blood pressures and headaches found to have an intraparenchymal hemorrhage in the left cerebellum.  Vital signs show hypertension but otherwise normal vitals.  Laboratory studies are fairly unremarkable aside from elevated creatinine.  Chest radiograph unremarkable.  CT head with evidence of IPH.  eICU Interventions  Tight SBP control, SBP 130-150  Interval CT scan per neurology, angiography pending.  MRI brain with without  Standard stroke workup with echo and labs for restratification  GI prophylaxis with pantoprazole  DVT prophylaxis with SCDs     Intervention Category Evaluation Type: New Patient Evaluation  Stephone Gum 07/29/2023, 2:11 AM

## 2023-07-29 NOTE — ED Notes (Signed)
 ..  EMTALA: REQUIRED DOCUMENTATION COMPLETED AND REVIEWED BY WRITER PRIOR TO PT TRANSFER MD REASSESSMENT EMTALA RN SECTION TRANSFER E-SIGN VS WITHIN REQUIRED TIME

## 2023-07-29 NOTE — Progress Notes (Addendum)
 STROKE TEAM PROGRESS NOTE   BRIEF HPI [name] is a 58 y.o. male with history of arthritis, bipolar disorder, renal cancer, chronic kidney disease, dementia, depression, hiatal hernia, kidney stones, hyperlipidemia, hypertension, PTSD, sleep apnea and stroke presenting with hypertension and headache.  Head CT revealed small cerebellar ICH.  Patient has been transferred here to the ICU, and blood pressure has been under good control with Cleviprex  able to be discontinued today.  NIH on Admission 1   SIGNIFICANT HOSPITAL EVENTS 1/8-patient admitted to the ICU  INTERIM HISTORY/SUBJECTIVE  Patient's blood pressure has remained within parameters without Cleviprex , as needed labetalol  and hydralazine  were ordered and home blood pressure medications started.  His neurological exam remains unchanged.  OBJECTIVE  CBC    Component Value Date/Time   WBC 8.2 07/29/2023 0540   RBC 4.82 07/29/2023 0540   HGB 14.4 07/29/2023 0540   HCT 43.5 07/29/2023 0540   PLT 232 07/29/2023 0540   MCV 90.2 07/29/2023 0540   MCV 89.2 12/21/2014 1722   MCH 29.9 07/29/2023 0540   MCHC 33.1 07/29/2023 0540   RDW 14.5 07/29/2023 0540   LYMPHSABS 2.0 07/29/2023 0540   MONOABS 0.5 07/29/2023 0540   EOSABS 0.2 07/29/2023 0540   BASOSABS 0.1 07/29/2023 0540    BMET    Component Value Date/Time   NA 136 07/29/2023 0540   NA 141 04/01/2022 1013   K 3.5 07/29/2023 0540   CL 101 07/29/2023 0540   CO2 24 07/29/2023 0540   GLUCOSE 97 07/29/2023 0540   BUN 18 07/29/2023 0540   BUN 19 04/01/2022 1013   CREATININE 1.31 (H) 07/29/2023 0540   CREATININE 1.45 (H) 08/13/2018 0802   CREATININE 1.45 (H) 08/13/2018 0802   CALCIUM  8.7 (L) 07/29/2023 0540   EGFR 52 (L) 04/01/2022 1013   GFRNONAA >60 07/29/2023 0540   GFRNONAA 54 (L) 08/13/2018 0802    IMAGING past 24 hours ECHOCARDIOGRAM COMPLETE Result Date: 07/29/2023    ECHOCARDIOGRAM REPORT   Patient Name:   Jake Brown Date of Exam:  07/29/2023 Medical Rec #:  [id number]           Height:       68.0 in Accession #:    [id number]          Weight:       262.0 lb Date of Birth:  [birth year]           BSA:          2.292 m Patient Age:    57 years            BP:           138/83 mmHg Patient Gender: M                   HR:           72 bpm. Exam Location:  Inpatient Procedure: 2D Echo, Cardiac Doppler and Color Doppler Indications:    Stroke I63.9  History:        Patient has prior history of Echocardiogram examinations, most                 recent 10/16/2017. CHF, Stroke and CKD, stage 3,                 Signs/Symptoms:Murmur and Shortness of Breath; Risk                 Factors:Hypertension, Sleep Apnea and Dyslipidemia.  Sonographer:  Thea Norlander RCS Referring Phys: [id number] SRISHTI L BHAGAT IMPRESSIONS  1. Left ventricular ejection fraction, by estimation, is 60 to 65%. The left ventricle has normal function. The left ventricle has no regional wall motion abnormalities. There is moderate concentric left ventricular hypertrophy. Left ventricular diastolic parameters are consistent with Grade I diastolic dysfunction (impaired relaxation).  2. Right ventricular systolic function is normal. The right ventricular size is normal. Tricuspid regurgitation signal is inadequate for assessing PA pressure.  3. The mitral valve is normal in structure. No evidence of mitral valve regurgitation.  4. The aortic valve was not well visualized. Aortic valve regurgitation is not visualized.  5. The inferior vena cava is normal in size with greater than 50% respiratory variability, suggesting right atrial pressure of 3 mmHg. Conclusion(s)/Recommendation(s): No intracardiac source of embolism detected on this transthoracic study. Consider a transesophageal echocardiogram to exclude cardiac source of embolism if clinically indicated. FINDINGS  Left Ventricle: Left ventricular ejection fraction, by estimation, is 60 to 65%. The left ventricle has normal function. The  left ventricle has no regional wall motion abnormalities. The left ventricular internal cavity size was normal in size. There is  moderate concentric left ventricular hypertrophy. Left ventricular diastolic parameters are consistent with Grade I diastolic dysfunction (impaired relaxation). Right Ventricle: The right ventricular size is normal. Right ventricular systolic function is normal. Tricuspid regurgitation signal is inadequate for assessing PA pressure. Left Atrium: Left atrial size was normal in size. Right Atrium: Right atrial size was normal in size. Pericardium: There is no evidence of pericardial effusion. Mitral Valve: The mitral valve is normal in structure. No evidence of mitral valve regurgitation. Tricuspid Valve: Tricuspid valve regurgitation is not demonstrated. Aortic Valve: The aortic valve was not well visualized. Aortic valve regurgitation is not visualized. Aortic valve peak gradient measures 6.4 mmHg. Pulmonic Valve: Pulmonic valve regurgitation is not visualized. Aorta: The aortic root and ascending aorta are structurally normal, with no evidence of dilitation. Venous: The inferior vena cava is normal in size with greater than 50% respiratory variability, suggesting right atrial pressure of 3 mmHg. IAS/Shunts: No atrial level shunt detected by color flow Doppler.  LEFT VENTRICLE PLAX 2D LVIDd:         3.90 cm   Diastology LVIDs:         2.30 cm   LV e' medial:    4.90 cm/s LV PW:         1.40 cm   LV E/e' medial:  13.1 LV IVS:        1.40 cm   LV e' lateral:   5.33 cm/s LVOT diam:     2.30 cm   LV E/e' lateral: 12.0 LV SV:         71 LV SV Index:   31 LVOT Area:     4.15 cm  RIGHT VENTRICLE             IVC RV S prime:     12.70 cm/s  IVC diam: 2.20 cm TAPSE (M-mode): 1.7 cm LEFT ATRIUM             Index        RIGHT ATRIUM           Index LA diam:        3.80 cm 1.66 cm/m   RA Area:     18.60 cm LA Vol (A2C):   48.7 ml 21.25 ml/m  RA Volume:   50.00 ml  21.81 ml/m LA Vol (A4C):  38.5  ml 16.80 ml/m LA Biplane Vol: 38.6 ml 16.84 ml/m  AORTIC VALVE AV Area (Vmax): 3.48 cm AV Vmax:        126.00 cm/s AV Peak Grad:   6.4 mmHg LVOT Vmax:      105.67 cm/s LVOT Vmean:     63.900 cm/s LVOT VTI:       0.171 m  AORTA Ao Root diam: 3.30 cm Ao Asc diam:  3.50 cm MITRAL VALVE MV Area (PHT): 2.95 cm     SHUNTS MV Decel Time: 257 msec     Systemic VTI:  0.17 m MV E velocity: 64.00 cm/s   Systemic Diam: 2.30 cm MV A velocity: 108.00 cm/s MV E/A ratio:  0.59 [place] signed by Ronal Ross Signature Date/Time: 07/29/2023/1:07:06 PM    Final    CT ANGIO HEAD NECK W WO CM Result Date: 07/29/2023 CLINICAL DATA:  58 year old male with headache and small left cerebellar hyperdense hemorrhage. EXAM: CT ANGIOGRAPHY HEAD AND NECK WITH CONTRAST TECHNIQUE: Multidetector CT imaging of the head and neck was performed using the standard protocol during bolus administration of intravenous contrast. Multiplanar CT image reconstructions and MIPs were obtained to evaluate the vascular anatomy. Carotid stenosis measurements (when applicable) are obtained utilizing NASCET criteria, using the distal internal carotid diameter as the denominator. RADIATION DOSE REDUCTION: This exam was performed according to the departmental dose-optimization program which includes automated exposure control, adjustment of the mA and/or kV according to patient size and/or use of iterative reconstruction technique. CONTRAST:  75mL OMNIPAQUE  IOHEXOL  350 MG/ML SOLN COMPARISON:  Head CT 0512 hours today reported separately. Neck CT 10/22/2020. Intracranial MRA 01/08/2021. FINDINGS: CTA NECK Skeleton: Cervical spine degeneration. No acute osseous abnormality identified. Upper chest: Trace retained secretions along the right lateral wall of the trachea at the thoracic inlet. Upper lungs well aerated. Negative visible mediastinum. Other neck: Negative. Aortic arch: 3 vessel arch. Minimal arch atherosclerosis. Mild tortuosity. Right  carotid system: Brachiocephalic artery and right CCA origin are patent with no significant plaque. Mildly tortuous right CCA. Minimal mostly calcified plaque at the right carotid bifurcation. Mildly tortuous right ICA without stenosis. Left carotid system: Similar left CCA and ICA tortuosity with no significant plaque or stenosis. Vertebral arteries: Mild calcified plaque at the right subclavian artery origin. Right vertebral artery origin is within normal limits. Suboptimal right vertebral artery detail in the lower neck related to streak artifact, but the right vertebral remains patent to the skull base and no significant atherosclerosis or stenosis is identified. No significant proximal left subclavian plaque and the left vertebral artery origin is normal. The left vertebral artery is mildly dominant and dolichoectatic. Mild obscuration of the left V1 segment from adjacent paravertebral venous contrast reflux. But otherwise the left vertebral artery is patent and within normal limits to the skull base. CTA HEAD Posterior circulation: Mildly ectatic distal vertebral arteries, left V4 dominant. Stable distal vertebral, vertebrobasilar junction, PICA origins since the 2022 MRA with no stenosis. There is mild left V4 calcified plaque. Mildly dolichoectatic basilar artery remains patent without stenosis. Patent SCA and PCA origins. Posterior communicating arteries are diminutive or absent. Bilateral PCA branches are patent with mild tortuosity, no stenosis. No CTA spot sign or abnormal vascularity in association with the deep left cerebellar hemorrhage on series 5, image 123. Left PICA branches appear within normal limits. Anterior circulation: Both ICA siphons are patent. Bilateral mild siphon tortuosity. Minimal siphon plaque. No siphon stenosis. Normal ophthalmic artery origins. Mildly ectatic carotid termini.  Normal MCA and ACA origins otherwise. Anterior communicating artery and bilateral ACA branches are within  normal limits. The left A1 is mildly dominant. Left MCA M1 segment and trifurcation are patent without stenosis. Right MCA M1 segment and bifurcation are patent without stenosis. Bilateral MCA branches are within normal limits. Venous sinuses: Patent. Anatomic variants: Mildly dominant left vertebral artery, left ACA A1. Review of the MIP images confirms the above findings IMPRESSION: 1. No CTA spot sign, aneurysm, or vascular malformation in association with the small Left Cerebellar Hemorrhage. 2. Positive for generalized arterial ectasia, tortuosity in the Head and Neck. But minimal atherosclerosis. No arterial stenosis. Electronically Signed   By: VEAR Hurst M.D.   On: 07/29/2023 06:34   CT HEAD WO CONTRAST Result Date: 07/29/2023 CLINICAL DATA:  58 year old male with headache and small left cerebellar hyperdense hemorrhage. EXAM: CT HEAD WITHOUT CONTRAST TECHNIQUE: Contiguous axial images were obtained from the base of the skull through the vertex without intravenous contrast. RADIATION DOSE REDUCTION: This exam was performed according to the departmental dose-optimization program which includes automated exposure control, adjustment of the mA and/or kV according to patient size and/or use of iterative reconstruction technique. COMPARISON:  Head CT 07/28/2023. FINDINGS: Brain: Small, oval 7 mm hyperdense hemorrhage in the deep left cerebellar nuclei is stable since yesterday (coronal image 50). No significant associated edema or mass effect. Stable gray-white differentiation elsewhere. Chronic small vessel disease with multiple chronic microhemorrhages in the brain on a 2021 MRI. Patchy and confluent hypodensity in the bilateral cerebral white matter. No midline shift, mass effect, or evidence of intracranial mass lesion. No ventriculomegaly. No cortically based acute infarct identified. Vascular: Calcified atherosclerosis at the skull base. No suspicious intracranial vascular hyperdensity. Skull: Stable,  intact. Sinuses/Orbits: Visualized paranasal sinuses and mastoids are stable and well aerated. Other: No acute orbit or scalp soft tissue finding. IMPRESSION: 1. No extension of the subcentimeter left deep cerebellar hemorrhage since yesterday. No significant edema or mass effect. 2. Underlying advanced chronic small vessel disease. No new intracranial abnormality. Electronically Signed   By: VEAR Hurst M.D.   On: 07/29/2023 05:42   CT HEAD WO CONTRAST ( ) Result Date: 07/28/2023 CLINICAL DATA:  New onset headaches EXAM: CT HEAD WITHOUT CONTRAST TECHNIQUE: Contiguous axial images were obtained from the base of the skull through the vertex without intravenous contrast. RADIATION DOSE REDUCTION: This exam was performed according to the departmental dose-optimization program which includes automated exposure control, adjustment of the mA and/or kV according to patient size and/or use of iterative reconstruction technique. COMPARISON:  10/15/2017 FINDINGS: Brain: A small focus of hemorrhage is noted in the left cerebellar hemisphere measuring up to 9 mm in greatest dimension. Minimal surrounding edema is noted. Scattered chronic white matter ischemic changes are seen. No other focal area of hemorrhage is seen. Vascular: No hyperdense vessel or unexpected calcification. Skull: Normal. Negative for fracture or focal lesion. Sinuses/Orbits: No acute finding. Other: None. IMPRESSION: Single focus of parenchymal hemorrhage in the left cerebellar hemisphere measuring 9 mm as described. Critical Value/emergent results were called by telephone at the time of interpretation on 07/28/2023 at 10:57 pm to Dr. BELVIE ESSEX , who verbally acknowledged these results. Electronically Signed   By: Oneil Devonshire M.D.   On: 07/28/2023 22:59   DG Chest Portable 1 View Result Date: 07/28/2023 CLINICAL DATA:  Hypertension headache EXAM: PORTABLE CHEST 1 VIEW COMPARISON:  07/27/2023 FINDINGS: The heart size and mediastinal contours are  within normal limits. Both lungs are clear. The  visualized skeletal structures are unremarkable. IMPRESSION: No active disease. Electronically Signed   By: Luke Bun M.D.   On: 07/28/2023 22:13    Vitals:   07/29/23 1100 07/29/23 1130 07/29/23 1200 07/29/23 1300  BP: (!) 147/113 (!) 162/93 (!) 134/90 (!) 141/100  Pulse: 78 87 65 62  Resp: 18 15 16 15   Temp:   98 F (36.7 C)   TempSrc:   Oral   SpO2: 94% 93% 90% 92%     PHYSICAL EXAM General:  Alert, well-nourished, well-developed patient in no acute distress Psych:  Mood and affect appropriate for situation CV: Regular rate and rhythm on monitor Respiratory:  Regular, unlabored respirations on room air GI: Abdomen soft and nontender   NEURO:  Mental Status: AA&Ox3, patient is able to give clear and coherent history Speech/Language: speech is without dysarthria or aphasia.    Cranial Nerves:  II: PERRL. Visual fields full.  III, IV, VI: EOMI. Eyelids elevate symmetrically.  V: Sensation is intact to light touch and symmetrical to face.  VII: Face is symmetrical resting and smiling VIII: hearing intact to voice. IX, X:Phonation is normal.  XII: tongue is midline without fasciculations. Motor: Able to move all 4 extremities with good antigravity strength, left leg slightly weaker than right Tone: is normal and bulk is normal Sensation- Intact to light touch bilaterally.  Coordination: FTN with slight ataxia on the left Gait- deferred  Most Recent NIH  1a Level of Conscious.: 0 1b LOC Questions: 0 1c LOC Commands: 0 2 Best Gaze: 0 3 Visual: 0 4 Facial Palsy:0  5a Motor Arm - left: 0 5b Motor Arm - Right: 0 6a Motor Leg - Left: 1 6b Motor Leg - Right: 0 7 Limb Ataxia: 1 8 Sensory: 0 9 Best Language: 0 10 Dysarthria: 0 11 Extinct. and Inatten.: 0 TOTAL: 2   ASSESSMENT/PLAN  ICH: Subcentimeter left cerebellar hemorrhage, etiology: Likely due to hypertensive emergency CT head 9 mm hemorrhage in left  cerebellum Repeat CT head 1/8 no extension of subcentimeter left deep cerebellar hemorrhage, chronic small vessel disease CTA head & neck no spot sign aneurysm or AVM at site of left cerebellar hemorrhage, generalized arterial ectasia and tortuosity in head and neck, no LVO MRI pending 2D Echo EF 60 to 65%  LDL 89 HgbA1c 5.8 UDS + for THC VTE prophylaxis -heparin  subq No antithrombotic prior to admission, now on No antithrombotic secondary to cerebellar IPH Therapy recommendations:  Pending Disposition: pending  Hx of stroke 09/2017 right BG infarct due to left sided weakness and numbness. MRI also showed old right thalamic, pontine and cerebellar infarcts. MRA, CUS unremarkable. EF 60-65%, LDL 126 and A1c 5.3, d/c on ASA and statin  Hypertension Home meds: Hydralazine  100 mg 3 times daily, losartan  100 mg daily, nebivolol  20 mg daily Stable  Resume nebivolol  Off Cleviprex  BP goal < 160 Long term BP goal normotensive  Hyperlipidemia Home meds: Atorvastatin  40 mg daily LDL 89, goal < 70 Consider increase to 80 mg of atorvastatin  at discharge  Substance Abuse Patient uses marijuana UDS positive for  THC  Will discuss marijuana cessation with patient  Other Stroke Risk Factors OSA  Other Active Problems PTSD Bipolar  CKD IIIa Cre 1.31 Hx of renal benign tumor s/p removal  DJD  Hospital day # 0  Patient seen by NP with MD, MD to edit note as needed. Cortney E Everitt Clint Kill , MSN, AGACNP-BC [facility] See Amion for schedule and pager information 07/29/2023 1:27  PM  ATTENDING NOTE: I reviewed above note and agree with the assessment and plan. Pt was seen and examined.   Echo tech at the bedside. Pt doing well, denies HA. Still has left FTN mild dysmetria, but HTS intact bilaterally. Moving all extremities. BP better now, off cleviprex . Resume home beta blocker. OK to work with PT and OT. On diet.   For detailed assessment and plan, please refer to  above/below as I have made changes wherever appropriate.   Ary Cummins, MD PhD Stroke Neurology 07/29/2023 7:16 PM  This patient is critically ill due to cerebellar ICH and at significant risk of neurological worsening, death form hematoma expansion and brain herniation. This patient's care requires constant monitoring of vital signs, hemodynamics, respiratory and cardiac monitoring, review of multiple databases, neurological assessment, discussion with family, other specialists and medical decision making of high complexity. I spent 30 minutes of neurocritical care time in the care of this patient.    To contact Stroke Continuity provider, please refer to [email or web address]. After hours, contact General Neurology
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
## 2023-07-29 NOTE — Progress Notes (Signed)
 SLP Cancellation Note  Patient Details Name: AKEEM HEPPLER MRN: [id number] DOB: 12/22/1965   Cancelled treatment:       Reason Eval/Treat Not Completed: Patient at procedure or test/unavailable   Chrisma Hurlock, Riley Nearing 07/29/2023, 10:22 AM

## 2023-07-30 ENCOUNTER — Inpatient Hospital Stay (HOSPITAL_COMMUNITY): Payer: Medicare Other

## 2023-07-30 DIAGNOSIS — I614 Nontraumatic intracerebral hemorrhage in cerebellum: Secondary | ICD-10-CM

## 2023-07-30 LAB — HEMOGLOBIN A1C
Hgb A1c MFr Bld: 5.6 % (ref 4.8–5.6)
Mean Plasma Glucose: 114 mg/dL

## 2023-07-30 MED ORDER — LOSARTAN POTASSIUM 50 MG PO TABS
50.0000 mg | ORAL_TABLET | Freq: Every day | ORAL | Status: DC
  Administered 2023-07-30 – 2023-07-31 (×2): 50 mg via ORAL
  Filled 2023-07-30 (×2): qty 1

## 2023-07-30 MED ORDER — ATORVASTATIN CALCIUM 80 MG PO TABS
80.0000 mg | ORAL_TABLET | Freq: Every day | ORAL | Status: DC
  Administered 2023-07-31: 80 mg via ORAL
  Filled 2023-07-30: qty 1

## 2023-07-30 MED ORDER — GADOBUTROL 1 MMOL/ML IV SOLN
10.0000 mL | Freq: Once | INTRAVENOUS | Status: AC | PRN
  Administered 2023-07-30: 10 mL via INTRAVENOUS

## 2023-07-30 NOTE — Evaluation (Signed)
 Occupational Therapy Evaluation Patient Details Name: Jake Brown MRN: [id number] DOB: [birth year] Today's Date: 07/30/2023   History of Present Illness [name] is a 58 y.o. male who presented with HTN and headache. Head CT revealed small cerebellar ICH. PMHx: arthritis, bipolar disorder, renal cancer, chronic kidney disease, dementia, depression, hiatal hernia, kidney stones, hyperlipidemia, hypertension, PTSD, sleep apnea and stroke   Clinical Impression   Jake Brown was evaluated s/p the above admission list. He lives alone, drives, does not work and is mod I with use of SPC at baseline. Upon evaluation, pt demonstrated mod I ability to complete mobility and ADLs with SPC. Pt does have chronic L foot drop and was noted to drag foot intermittently, he states this is normal for him and he has not had a fall in the past 6 months. BP slightly elevated after mobility 166/96. SpO2 >90% on RA. HR 70s during activity. Pt does not require further acute, or follow up OT services. Recommend discharge back to pt's environment with assist as needed. OT to sign off with appreciation of order, please re-consult if needed.         If plan is discharge home, recommend the following: Assist for transportation;Assistance with cooking/housework    Functional Status Assessment  Patient has had a recent decline in their functional status and demonstrates the ability to make significant improvements in function in a reasonable and predictable amount of time.  Equipment Recommendations  None recommended by OT       Precautions / Restrictions Precautions Precautions: Fall Precaution Comments: chronic L hemiplegia Restrictions Weight Bearing Restrictions Per Provider Order: No      Mobility Bed Mobility Overal bed mobility: Modified Independent                  Transfers Overall transfer level: Modified independent Equipment used: Straight cane                       Balance Overall balance assessment: Mild deficits observed, not formally tested               ADL either performed or assessed with clinical judgement   ADL Overall ADL's : Modified independent;At baseline                 General ADL Comments: Mod I for all ADLs with SPC, L foot drag noted but no overt LOB. Pt with complaints of stiffness and back pain however he is still able to complete functional tasks with mod I     Vision Baseline Vision/History: 1 Wears glasses Vision Assessment?: No apparent visual deficits     Perception Perception: Within Functional Limits       Praxis Praxis: WFL       Pertinent Vitals/Pain Pain Assessment Pain Assessment: Faces Faces Pain Scale: Hurts a little bit Pain Location: back Pain Descriptors / Indicators: Discomfort Pain Intervention(s): Limited activity within patient's tolerance, Monitored during session     Extremity/Trunk Assessment Upper Extremity Assessment Upper Extremity Assessment: LUE deficits/detail LUE Deficits / Details: 4-/5, slowed coordination. slightly ataxic. pt reports this is chronic from CVA in 2019 LUE Sensation: decreased light touch LUE Coordination: decreased fine motor   Lower Extremity Assessment Lower Extremity Assessment: Defer to PT evaluation   Cervical / Trunk Assessment Cervical / Trunk Assessment: Normal   Communication Communication Communication: No apparent difficulties   Cognition Arousal: Alert Behavior During Therapy: WFL for tasks assessed/performed, Flat affect Overall Cognitive Status: Within Functional  Limits for tasks assessed           General Comments  BP slightly elevated after mobility 166/96. SpO2 >90% on RA. HR 70s with activity            Home Living Family/patient expects to be discharged to:: Private residence Living Arrangements: Alone Available Help at Discharge: Friend(s);Available PRN/intermittently Type of Home: Apartment Home Access: Stairs to  enter Entrance Stairs-Number of Steps: 1 Entrance Stairs-Rails: None Home Layout: One level     Bathroom Shower/Tub: Chief Strategy Officer: Standard     Home Equipment: Agricultural Consultant (2 wheels);Cane - single point   Additional Comments: has a dog name pinto bean      Prior Functioning/Environment Prior Level of Function : Independent/Modified Independent;Driving;History of Falls (last six months)             Mobility Comments: ambulates with SPC, chronic L foot drop. does not wear brace. denies recent falls. walks dog several times a day ADLs Comments: mod I ADLs, has a house cleaner otherwise indep IADLs. Drives. does not work.        OT Problem List: Decreased range of motion;Decreased activity tolerance;Impaired balance (sitting and/or standing)         OT Goals(Current goals can be found in the care plan section) Acute Rehab OT Goals Patient Stated Goal: home today OT Goal Formulation: With patient Time For Goal Achievement: 07/30/23 Potential to Achieve Goals: Good   AM-PAC OT 6 Clicks Daily Activity     Outcome Measure Help from another person eating meals?: None Help from another person taking care of personal grooming?: None Help from another person toileting, which includes using toliet, bedpan, or urinal?: None Help from another person bathing (including washing, rinsing, drying)?: None Help from another person to put on and taking off regular upper body clothing?: None Help from another person to put on and taking off regular lower body clothing?: None 6 Click Score: 24   End of Session Equipment Utilized During Treatment: Other (comment) [facility]) Nurse Communication: Mobility status  Activity Tolerance: Patient tolerated treatment well Patient left: in chair;with call bell/phone within reach  OT Visit Diagnosis: Other abnormalities of gait and mobility (R26.89)                Time: 9159-9097 OT Time Calculation (min): 22 min Charges:   OT General Charges $OT Visit: 1 Visit OT Evaluation $OT Eval Moderate Complexity: 1 Mod  Jake Brown, OTR/L Acute Rehabilitation Services Office [phone] Secure Chat Communication Preferred   Jake Brown 07/30/2023, 9:27 AM

## 2023-07-30 NOTE — Discharge Summary (Addendum)
 Stroke Discharge Summary  Patient ID: Jake Brown   MRN: [id number]      DOB: 1965/11/23  Date of Admission: 07/29/2023 Date of Discharge: 07/31/2023  Attending Physician:  Stroke, Md, MD Consultant(s):    None  Patient's PCP:  Gretel App, NP  DISCHARGE PRIMARY DIAGNOSIS:   ICH: Subcentimeter left cerebellar hemorrhage, etiology: Likely due to hypertensive emergency   Secondary Diagnoses: Hypertension Hyperlipidemia History of stroke THC abuse OSA PTSD Bipolar disorder CKD 3a History of benign renal tumor status post removal DJD   Allergies as of 07/31/2023   No Known Allergies      Medication List     TAKE these medications    atorvastatin  40 MG tablet Commonly known as: LIPITOR  TAKE ONE TABLET BY MOUTH DAILY AT 6 P.M.   B-12 5000 MCG Caps Take 1 capsule by mouth daily. Patient takes in a gummy form.   baclofen  10 MG tablet Commonly known as: LIORESAL  Take 10 mg by mouth 3 (three) times daily as needed for muscle spasms.   buPROPion  300 MG 24 hr tablet Commonly known as: Wellbutrin  XL Take 1 tablet (300 mg total) by mouth daily.   fentaNYL  12 MCG/HR Commonly known as: DURAGESIC  Place 1 patch onto the skin every 3 (three) days. Patch was applied on 07/27/2023.   gabapentin  300 MG capsule Commonly known as: NEURONTIN  Take 1 capsule (300 mg total) by mouth 3 (three) times daily.   hydrALAZINE  50 MG tablet Commonly known as: APRESOLINE  Take 1 tablet (50 mg total) by mouth 2 (two) times daily. What changed:  medication strength how much to take when to take this   losartan  50 MG tablet Commonly known as: COZAAR  Take 1 tablet (50 mg total) by mouth 2 (two) times daily. What changed:  medication strength how much to take when to take this   Nebivolol  HCl 20 MG Tabs Take 1 tablet (20 mg total) by mouth daily. Start taking on: August 01, 2023 What changed:  how much to take how to take this when to take this additional  instructions   oxyCODONE -acetaminophen  5-325 MG tablet Commonly known as: PERCOCET/ROXICET Take 1-2 tablets by mouth every 4 (four) hours as needed for moderate pain.   Vitamin D3 125 MCG (5000 UT) Caps Take 1 capsule (5,000 Units total) by mouth daily.        LABORATORY STUDIES CBC    Component Value Date/Time   WBC 8.2 07/29/2023 0540   RBC 4.82 07/29/2023 0540   HGB 14.4 07/29/2023 0540   HCT 43.5 07/29/2023 0540   PLT 232 07/29/2023 0540   MCV 90.2 07/29/2023 0540   MCV 89.2 12/21/2014 1722   MCH 29.9 07/29/2023 0540   MCHC 33.1 07/29/2023 0540   RDW 14.5 07/29/2023 0540   LYMPHSABS 2.0 07/29/2023 0540   MONOABS 0.5 07/29/2023 0540   EOSABS 0.2 07/29/2023 0540   BASOSABS 0.1 07/29/2023 0540   CMP    Component Value Date/Time   NA 136 07/29/2023 0540   NA 141 04/01/2022 1013   K 3.5 07/29/2023 0540   CL 101 07/29/2023 0540   CO2 24 07/29/2023 0540   GLUCOSE 97 07/29/2023 0540   BUN 18 07/29/2023 0540   BUN 19 04/01/2022 1013   CREATININE 1.31 (H) 07/29/2023 0540   CREATININE 1.45 (H) 08/13/2018 0802   CREATININE 1.45 (H) 08/13/2018 0802   CALCIUM  8.7 (L) 07/29/2023 0540   PROT 6.1 (L) 07/29/2023 0540   PROT 7.0 04/01/2022 1013  ALBUMIN 3.3 (L) 07/29/2023 0540   ALBUMIN 4.6 04/01/2022 1013   AST 20 07/29/2023 0540   ALT 19 07/29/2023 0540   ALKPHOS 69 07/29/2023 0540   BILITOT 1.2 07/29/2023 0540   BILITOT 0.7 04/01/2022 1013   GFRNONAA >60 07/29/2023 0540   GFRNONAA 54 (L) 08/13/2018 0802   GFRAA 63 08/13/2018 0802   COAGS Lab Results  Component Value Date   INR 1.0 07/28/2023   INR 0.92 10/15/2017   Lipid Panel    Component Value Date/Time   CHOL 159 07/29/2023 0540   TRIG 100 07/29/2023 0540   HDL 50 07/29/2023 0540   CHOLHDL 3.2 07/29/2023 0540   VLDL 20 07/29/2023 0540   LDLCALC 89 07/29/2023 0540   LDLCALC 54 08/13/2018 0802   HgbA1C  Lab Results  Component Value Date   HGBA1C 5.6 07/29/2023   Urine Drug Screen positive for  prescribed opiates and THC Alcohol Level    Component Value Date/Time   ETH <10 07/28/2023 2304     SIGNIFICANT DIAGNOSTIC STUDIES MR BRAIN W WO CONTRAST Result Date: 07/30/2023 CLINICAL DATA:  58 year old male with small left cerebellar hemorrhage, presenting with headache. EXAM: MRI HEAD WITHOUT AND WITH CONTRAST TECHNIQUE: Multiplanar, multiecho pulse sequences of the brain and surrounding structures were obtained without and with intravenous contrast. CONTRAST:  10mL GADAVIST  GADOBUTROL  1 MMOL/ML IV SOLN COMPARISON:  CT head, CTA head and neck yesterday. Prior brain MRI 09/26/2019. FINDINGS: Brain: Hemosiderin associated susceptibility artifact on diffusion imaging, including corresponding to the recent cerebellar hemorrhage on series 2, image 13. No convincing restricted diffusion, evidence of acute infarction. On SWI numerous areas of chronic hemorrhage in the brain are noted, with progression since 2021 not only in the bilateral cerebellum, but also in the brainstem (series 7, image 35), the bilateral thalami (image 53) and and the left anterior deep white matter (image 51). Chronic hemorrhagic lacunar infarct of the posterior right external capsule is chronic and stable. The pattern more resembles advanced small vessel disease than amyloid angiopathy. Mild T2 and FLAIR hyperintense edema surrounding the small left cerebellar hemorrhage which appears stable or smaller (series 6, images 10 and 11). No posterior fossa mass effect. Other Patchy and confluent, widespread bilateral white matter T2 and FLAIR hyperintensity does not appear significantly changed from 2021. T2 heterogeneity in the deep gray matter and the brainstem has progressed, including chronic but new linear lacunar infarct in the pons series 4, image 11. No abnormal enhancement identified. No dural thickening. No midline shift, mass effect, evidence of mass lesion, ventriculomegaly, extra-axial fluid. Cervicomedullary junction and  pituitary are within normal limits. Vascular: Major intracranial vascular flow voids are stable. Generalized intracranial artery dolichoectasia again noted. Following contrast the major dural venous sinuses are enhancing and appear to be patent. Skull and upper cervical spine: Negative for age visible cervical spine. Visualized bone marrow signal is within normal limits. Sinuses/Orbits: Negative. Other: Mastoids are clear. Visible internal auditory structures appear normal. Negative visible scalp and face. IMPRESSION: 1. Stable or smaller subcentimeter left cerebellar hemorrhage with minor associated cerebellar edema and no mass effect. 2. Progressed since 2021 and severe chronic small vessel disease, with numerous chronic microhemorrhages throughout the brain. Doubt amyloid angiopathy in this age group, and there are chronic lacunar infarcts in the cerebral white matter, the bilateral deep gray nuclei, and the pons. Electronically Signed   By: VEAR Hurst M.D.   On: 07/30/2023 06:39   ECHOCARDIOGRAM COMPLETE Result Date: 07/29/2023    ECHOCARDIOGRAM REPORT  Patient Name:   Jake Brown Date of Exam: 07/29/2023 Medical Rec #:  [id number]           Height:       68.0 in Accession #:    [id number]          Weight:       262.0 lb Date of Birth:  [birth year]           BSA:          2.292 m Patient Age:    57 years            BP:           138/83 mmHg Patient Gender: M                   HR:           72 bpm. Exam Location:  Inpatient Procedure: 2D Echo, Cardiac Doppler and Color Doppler Indications:    Stroke I63.9  History:        Patient has prior history of Echocardiogram examinations, most                 recent 10/16/2017. CHF, Stroke and CKD, stage 3,                 Signs/Symptoms:Murmur and Shortness of Breath; Risk                 Factors:Hypertension, Sleep Apnea and Dyslipidemia.  Sonographer:    Thea Norlander RCS Referring Phys: [id number] SRISHTI L BHAGAT IMPRESSIONS  1. Left ventricular ejection fraction,  by estimation, is 60 to 65%. The left ventricle has normal function. The left ventricle has no regional wall motion abnormalities. There is moderate concentric left ventricular hypertrophy. Left ventricular diastolic parameters are consistent with Grade I diastolic dysfunction (impaired relaxation).  2. Right ventricular systolic function is normal. The right ventricular size is normal. Tricuspid regurgitation signal is inadequate for assessing PA pressure.  3. The mitral valve is normal in structure. No evidence of mitral valve regurgitation.  4. The aortic valve was not well visualized. Aortic valve regurgitation is not visualized.  5. The inferior vena cava is normal in size with greater than 50% respiratory variability, suggesting right atrial pressure of 3 mmHg. Conclusion(s)/Recommendation(s): No intracardiac source of embolism detected on this transthoracic study. Consider a transesophageal echocardiogram to exclude cardiac source of embolism if clinically indicated. FINDINGS  Left Ventricle: Left ventricular ejection fraction, by estimation, is 60 to 65%. The left ventricle has normal function. The left ventricle has no regional wall motion abnormalities. The left ventricular internal cavity size was normal in size. There is  moderate concentric left ventricular hypertrophy. Left ventricular diastolic parameters are consistent with Grade I diastolic dysfunction (impaired relaxation). Right Ventricle: The right ventricular size is normal. Right ventricular systolic function is normal. Tricuspid regurgitation signal is inadequate for assessing PA pressure. Left Atrium: Left atrial size was normal in size. Right Atrium: Right atrial size was normal in size. Pericardium: There is no evidence of pericardial effusion. Mitral Valve: The mitral valve is normal in structure. No evidence of mitral valve regurgitation. Tricuspid Valve: Tricuspid valve regurgitation is not demonstrated. Aortic Valve: The aortic valve  was not well visualized. Aortic valve regurgitation is not visualized. Aortic valve peak gradient measures 6.4 mmHg. Pulmonic Valve: Pulmonic valve regurgitation is not visualized. Aorta: The aortic root and ascending aorta are structurally normal, with no evidence of dilitation. Venous: The inferior vena cava is  normal in size with greater than 50% respiratory variability, suggesting right atrial pressure of 3 mmHg. IAS/Shunts: No atrial level shunt detected by color flow Doppler.  LEFT VENTRICLE PLAX 2D LVIDd:         3.90 cm   Diastology LVIDs:         2.30 cm   LV e' medial:    4.90 cm/s LV PW:         1.40 cm   LV E/e' medial:  13.1 LV IVS:        1.40 cm   LV e' lateral:   5.33 cm/s LVOT diam:     2.30 cm   LV E/e' lateral: 12.0 LV SV:         71 LV SV Index:   31 LVOT Area:     4.15 cm  RIGHT VENTRICLE             IVC RV S prime:     12.70 cm/s  IVC diam: 2.20 cm TAPSE (M-mode): 1.7 cm LEFT ATRIUM             Index        RIGHT ATRIUM           Index LA diam:        3.80 cm 1.66 cm/m   RA Area:     18.60 cm LA Vol (A2C):   48.7 ml 21.25 ml/m  RA Volume:   50.00 ml  21.81 ml/m LA Vol (A4C):   38.5 ml 16.80 ml/m LA Biplane Vol: 38.6 ml 16.84 ml/m  AORTIC VALVE AV Area (Vmax): 3.48 cm AV Vmax:        126.00 cm/s AV Peak Grad:   6.4 mmHg LVOT Vmax:      105.67 cm/s LVOT Vmean:     63.900 cm/s LVOT VTI:       0.171 m  AORTA Ao Root diam: 3.30 cm Ao Asc diam:  3.50 cm MITRAL VALVE MV Area (PHT): 2.95 cm     SHUNTS MV Decel Time: 257 msec     Systemic VTI:  0.17 m MV E velocity: 64.00 cm/s   Systemic Diam: 2.30 cm MV A velocity: 108.00 cm/s MV E/A ratio:  0.59 [place] signed by Ronal Ross Signature Date/Time: 07/29/2023/1:07:06 PM    Final    CT ANGIO HEAD NECK W WO CM Result Date: 07/29/2023 CLINICAL DATA:  58 year old male with headache and small left cerebellar hyperdense hemorrhage. EXAM: CT ANGIOGRAPHY HEAD AND NECK WITH CONTRAST TECHNIQUE: Multidetector CT imaging of the head and  neck was performed using the standard protocol during bolus administration of intravenous contrast. Multiplanar CT image reconstructions and MIPs were obtained to evaluate the vascular anatomy. Carotid stenosis measurements (when applicable) are obtained utilizing NASCET criteria, using the distal internal carotid diameter as the denominator. RADIATION DOSE REDUCTION: This exam was performed according to the departmental dose-optimization program which includes automated exposure control, adjustment of the mA and/or kV according to patient size and/or use of iterative reconstruction technique. CONTRAST:  75mL OMNIPAQUE  IOHEXOL  350 MG/ML SOLN COMPARISON:  Head CT 0512 hours today reported separately. Neck CT 10/22/2020. Intracranial MRA 01/08/2021. FINDINGS: CTA NECK Skeleton: Cervical spine degeneration. No acute osseous abnormality identified. Upper chest: Trace retained secretions along the right lateral wall of the trachea at the thoracic inlet. Upper lungs well aerated. Negative visible mediastinum. Other neck: Negative. Aortic arch: 3 vessel arch. Minimal arch atherosclerosis. Mild tortuosity. Right carotid system: Brachiocephalic artery and right  CCA origin are patent with no significant plaque. Mildly tortuous right CCA. Minimal mostly calcified plaque at the right carotid bifurcation. Mildly tortuous right ICA without stenosis. Left carotid system: Similar left CCA and ICA tortuosity with no significant plaque or stenosis. Vertebral arteries: Mild calcified plaque at the right subclavian artery origin. Right vertebral artery origin is within normal limits. Suboptimal right vertebral artery detail in the lower neck related to streak artifact, but the right vertebral remains patent to the skull base and no significant atherosclerosis or stenosis is identified. No significant proximal left subclavian plaque and the left vertebral artery origin is normal. The left vertebral artery is mildly dominant and  dolichoectatic. Mild obscuration of the left V1 segment from adjacent paravertebral venous contrast reflux. But otherwise the left vertebral artery is patent and within normal limits to the skull base. CTA HEAD Posterior circulation: Mildly ectatic distal vertebral arteries, left V4 dominant. Stable distal vertebral, vertebrobasilar junction, PICA origins since the 2022 MRA with no stenosis. There is mild left V4 calcified plaque. Mildly dolichoectatic basilar artery remains patent without stenosis. Patent SCA and PCA origins. Posterior communicating arteries are diminutive or absent. Bilateral PCA branches are patent with mild tortuosity, no stenosis. No CTA spot sign or abnormal vascularity in association with the deep left cerebellar hemorrhage on series 5, image 123. Left PICA branches appear within normal limits. Anterior circulation: Both ICA siphons are patent. Bilateral mild siphon tortuosity. Minimal siphon plaque. No siphon stenosis. Normal ophthalmic artery origins. Mildly ectatic carotid termini. Normal MCA and ACA origins otherwise. Anterior communicating artery and bilateral ACA branches are within normal limits. The left A1 is mildly dominant. Left MCA M1 segment and trifurcation are patent without stenosis. Right MCA M1 segment and bifurcation are patent without stenosis. Bilateral MCA branches are within normal limits. Venous sinuses: Patent. Anatomic variants: Mildly dominant left vertebral artery, left ACA A1. Review of the MIP images confirms the above findings IMPRESSION: 1. No CTA spot sign, aneurysm, or vascular malformation in association with the small Left Cerebellar Hemorrhage. 2. Positive for generalized arterial ectasia, tortuosity in the Head and Neck. But minimal atherosclerosis. No arterial stenosis. Electronically Signed   By: VEAR Hurst M.D.   On: 07/29/2023 06:34   CT HEAD WO CONTRAST Result Date: 07/29/2023 CLINICAL DATA:  58 year old male with headache and small left cerebellar  hyperdense hemorrhage. EXAM: CT HEAD WITHOUT CONTRAST TECHNIQUE: Contiguous axial images were obtained from the base of the skull through the vertex without intravenous contrast. RADIATION DOSE REDUCTION: This exam was performed according to the departmental dose-optimization program which includes automated exposure control, adjustment of the mA and/or kV according to patient size and/or use of iterative reconstruction technique. COMPARISON:  Head CT 07/28/2023. FINDINGS: Brain: Small, oval 7 mm hyperdense hemorrhage in the deep left cerebellar nuclei is stable since yesterday (coronal image 50). No significant associated edema or mass effect. Stable gray-white differentiation elsewhere. Chronic small vessel disease with multiple chronic microhemorrhages in the brain on a 2021 MRI. Patchy and confluent hypodensity in the bilateral cerebral white matter. No midline shift, mass effect, or evidence of intracranial mass lesion. No ventriculomegaly. No cortically based acute infarct identified. Vascular: Calcified atherosclerosis at the skull base. No suspicious intracranial vascular hyperdensity. Skull: Stable, intact. Sinuses/Orbits: Visualized paranasal sinuses and mastoids are stable and well aerated. Other: No acute orbit or scalp soft tissue finding. IMPRESSION: 1. No extension of the subcentimeter left deep cerebellar hemorrhage since yesterday. No significant edema or mass effect. 2. Underlying advanced  chronic small vessel disease. No new intracranial abnormality. Electronically Signed   By: VEAR Hurst M.D.   On: 07/29/2023 05:42   CT HEAD WO CONTRAST ( ) Result Date: 07/28/2023 CLINICAL DATA:  New onset headaches EXAM: CT HEAD WITHOUT CONTRAST TECHNIQUE: Contiguous axial images were obtained from the base of the skull through the vertex without intravenous contrast. RADIATION DOSE REDUCTION: This exam was performed according to the departmental dose-optimization program which includes automated exposure  control, adjustment of the mA and/or kV according to patient size and/or use of iterative reconstruction technique. COMPARISON:  10/15/2017 FINDINGS: Brain: A small focus of hemorrhage is noted in the left cerebellar hemisphere measuring up to 9 mm in greatest dimension. Minimal surrounding edema is noted. Scattered chronic white matter ischemic changes are seen. No other focal area of hemorrhage is seen. Vascular: No hyperdense vessel or unexpected calcification. Skull: Normal. Negative for fracture or focal lesion. Sinuses/Orbits: No acute finding. Other: None. IMPRESSION: Single focus of parenchymal hemorrhage in the left cerebellar hemisphere measuring 9 mm as described. Critical Value/emergent results were called by telephone at the time of interpretation on 07/28/2023 at 10:57 pm to Dr. BELVIE ESSEX , who verbally acknowledged these results. Electronically Signed   By: Oneil Devonshire M.D.   On: 07/28/2023 22:59   DG Chest Portable 1 View Result Date: 07/28/2023 CLINICAL DATA:  Hypertension headache EXAM: PORTABLE CHEST 1 VIEW COMPARISON:  07/27/2023 FINDINGS: The heart size and mediastinal contours are within normal limits. Both lungs are clear. The visualized skeletal structures are unremarkable. IMPRESSION: No active disease. Electronically Signed   By: Luke Bun M.D.   On: 07/28/2023 22:13   CT Angio Chest/Abd/Pel for Dissection W and/or Wo Contrast Result Date: 07/27/2023 CLINICAL DATA:  Elevated blood pressure, acute aortic syndrome suspected history of renal cell carcinoma status post right nephrectomy EXAM: CT ANGIOGRAPHY CHEST, ABDOMEN AND PELVIS TECHNIQUE: Non-contrast CT of the chest was initially obtained. Multidetector CT imaging through the chest, abdomen and pelvis was performed using the standard protocol during bolus administration of intravenous contrast. Multiplanar reconstructed images and MIPs were obtained and reviewed to evaluate the vascular anatomy. RADIATION DOSE REDUCTION:  This exam was performed according to the departmental dose-optimization program which includes automated exposure control, adjustment of the mA and/or kV according to patient size and/or use of iterative reconstruction technique. CONTRAST:  OMNIPAQUE  IOHEXOL  350 MG/ML SOLN COMPARISON:  None Available. FINDINGS: CTA CHEST FINDINGS VASCULAR Aorta: Satisfactory opacification of the aorta. Normal contour and caliber of the thoracic aorta. No evidence of aneurysm, dissection, or other acute aortic pathology. Scattered aortic atherosclerosis. Cardiovascular: No evidence of pulmonary embolism on limited non-tailored examination. Normal heart size. No pericardial effusion. Review of the MIP images confirms the above findings. NON VASCULAR Mediastinum/Nodes: No enlarged mediastinal, hilar, or axillary lymph nodes. Thyroid  gland, trachea, and esophagus demonstrate no significant findings. Lungs/Pleura: Minimal paraseptal emphysema. No pleural effusion or pneumothorax. Musculoskeletal: No chest wall abnormality. No acute osseous findings. Chronic fracture deformities of the posterior left ribs. Review of the MIP images confirms the above findings. CTA ABDOMEN AND PELVIS FINDINGS VASCULAR Normal contour and caliber of the abdominal aorta. No evidence of aneurysm, dissection, or other acute aortic pathology. Standard branching pattern of the abdominal aorta with solitary bilateral renal arteries. Scattered aortic atherosclerosis. Review of the MIP images confirms the above findings. NON-VASCULAR Hepatobiliary: No solid liver abnormality is seen. Hepatic steatosis. No gallstones, gallbladder wall thickening, or biliary dilatation. Pancreas: Unremarkable. No pancreatic ductal dilatation or surrounding inflammatory  changes. Spleen: Normal in size without significant abnormality. Adrenals/Urinary Tract: Adrenal glands are unremarkable. Status post right nephrectomy. Simple, benign left renal cortical cysts, for which no  further follow-up or characterization is required. The left kidney is otherwise normal, without renal calculi, solid lesion, or hydronephrosis. Bladder is unremarkable. Stomach/Bowel: Stomach is within normal limits. Appendix appears normal. No evidence of bowel wall thickening, distention, or inflammatory changes. Lymphatic: No enlarged abdominal or pelvic lymph nodes. Reproductive: No mass or other significant abnormality. Other: Hernia mesh repair of the right ventral hemiabdomen. No ascites. Musculoskeletal: No acute osseous findings. IMPRESSION: 1. Normal contour and caliber of the thoracic and abdominal aorta. No evidence of aneurysm, dissection, or other acute aortic pathology. Scattered aortic atherosclerosis. 2. Status post right nephrectomy. No evidence of lymphadenopathy or metastatic disease in the chest, abdomen, or pelvis. 3. Hepatic steatosis. Aortic Atherosclerosis (ICD10-I70.0) and Emphysema (ICD10-J43.9). Electronically Signed   By: Marolyn JONETTA Jaksch M.D.   On: 07/27/2023 21:55   DG Chest 2 View Result Date: 07/27/2023 CLINICAL DATA:  Hypertension. EXAM: CHEST - 2 VIEW COMPARISON:  Chest x-ray 04/10/2023. FINDINGS: The thoracic aorta is markedly ectatic similar to prior. Heart is normal in size. There is no focal lung infiltrate, pleural effusion or pneumothorax. No acute fractures are seen. There are degenerative changes of the right shoulder. IMPRESSION: 1. No active cardiopulmonary disease. 2. Markedly ectatic thoracic aorta similar to prior. Electronically Signed   By: Greig Pique M.D.   On: 07/27/2023 18:31       HISTORY OF PRESENT ILLNESS 58 y.o. patient with history of arthritis, bipolar disorder, renal cancer, chronic kidney disease, dementia, depression, hiatal hernia, kidney stones, hypertension, hyperlipidemia, PTSD, sleep apnea and stroke was admitted with hypertension and headache  HOSPITAL COURSE Patient was found to have a small left cerebellar IPH and was admitted to the ICU.   Blood pressure was controlled with Cleviprex  which was able to be discontinued on 1/8.  Blood pressure was noted to be stable on the low end later in the day, and hydralazine  was discontinued.  On 1/9, blood pressure remains somewhat elevated and losartan  was added to blood pressure regimen  ICH: Subcentimeter left cerebellar hemorrhage, etiology: Likely due to hypertensive emergency CT head 9 mm hemorrhage in left cerebellum Repeat CT head 1/8 no extension of subcentimeter left deep cerebellar hemorrhage, chronic small vessel disease CTA head & neck no spot sign aneurysm or AVM at site of left cerebellar hemorrhage, generalized arterial ectasia and tortuosity in head and neck, no LVO MRI stable left cerebellar hemorrhage, chronic small vessel disease with numerous microhemorrhages, likely hypertensive in nature 2D Echo EF 60 to 65%  LDL 89 HgbA1c 5.8 UDS + for THC VTE prophylaxis -heparin  subq No antithrombotic prior to admission, now on No antithrombotic secondary to cerebellar IPH Therapy recommendations:  No follow up needed  Disposition: pending   Hx of stroke 09/2017 right BG infarct due to left sided weakness and numbness. MRI also showed old right thalamic, pontine and cerebellar infarcts. MRA, CUS unremarkable. EF 60-65%, LDL 126 and A1c 5.3, d/c on ASA and statin   Hypertension Home meds: Hydralazine  100 mg 3 times daily, losartan  100 mg daily, nebivolol  20 mg daily Largely stable with some hypertensive episodes requiring as needed IV medications Resume nebivolol  20mg  daily, Hydralazine  50mg  TID Will hold losartan  as kidney function is still elevated Off Cleviprex  BP goal < 160 Long term BP goal normotensive   Hyperlipidemia Home meds: Atorvastatin  40 mg daily LDL 89, goal <  70 Consider increase to 80 mg of atorvastatin  at discharge   Substance Abuse Patient uses marijuana UDS positive for  THC  Will discuss marijuana cessation with patient   Other Stroke Risk  Factors OSA   Other Active Problems PTSD Bipolar  CKD IIIa Cre 1.31 Hx of renal benign tumor s/p removal  DJD   DISCHARGE EXAM  PHYSICAL EXAM General:  Alert, well-nourished, well-developed patient in no acute distress Psych:  Mood and affect appropriate for situation CV: Regular rate and rhythm on monitor Respiratory:  Regular, unlabored respirations on room air GI: Abdomen soft and nontender     NEURO:  Mental Status: AA&Ox3, patient is able to give clear and coherent history Speech/Language: speech is without dysarthria or aphasia.     Cranial Nerves:  II: PERRL. Visual fields full.  III, IV, VI: EOMI. Eyelids elevate symmetrically.  V: Sensation is intact to light touch and symmetrical to face.  VII: Face is symmetrical resting and smiling VIII: hearing intact to voice. IX, X:Phonation is normal.  XII: tongue is midline without fasciculations. Motor: Able to move all 4 extremities with good antigravity strength, left leg slightly weaker than right Tone: is normal and bulk is normal Sensation- Intact to light touch bilaterally but diminished on the left Coordination: FTN intact bilaterally Gait- deferred   Most Recent NIH  1a Level of Conscious.: 0 1b LOC Questions: 0 1c LOC Commands: 0 2 Best Gaze: 0 3 Visual: 0 4 Facial Palsy:0  5a Motor Arm - left: 0 5b Motor Arm - Right: 0 6a Motor Leg - Left: 0 6b Motor Leg - Right: 0 7 Limb Ataxia: 0 8 Sensory: 1 9 Best Language: 0 10 Dysarthria: 0 11 Extinct. and Inatten.: 0 TOTAL: 1     Discharge Diet       Diet   Diet Heart Room service appropriate? Yes; Fluid consistency: Thin   liquids  DISCHARGE PLAN Disposition: Home No antithrombotic for secondary stroke prevention  Ongoing stroke risk factor control by Primary Care Physician at time of discharge Follow-up PCP Gretel App, NP in 2 weeks. Follow up appt scheduled for 1/21 - Needs to have labs redrawn at appointment Follow-up [facility] with  Dr. Annalee Casa  Follow-up in [facility] in 8 weeks, office to schedule an appointment.   35 minutes were spent preparing discharge.  Patient seen and examined by NP/APP with MD. MD to update note as needed.   Jorene Last, DNP, FNP-BC [facility] Pager: [phone]  ATTENDING NOTE: I reviewed above note and agree with the assessment and plan. Pt was seen and examined.   Speech therapist at bedside.  Patient neuro stable, unchanged.  BP still intermittently elevated needing overnight IV meds.  However this morning largely stable, continue Bystolic , increase hydralazine  to 50 mg every 8 hours, off losartan  given elevated creatinine but still around baseline.  She will be discharged with outpatient follow-up with PCP for BP monitoring, BP meds adjustment and creatinine monitoring. Follow up appt scheduled for 1/21 - Needs to have labs redrawn at appointment  For detailed assessment and plan, please refer to above/below as I have made changes wherever appropriate.   Ary Cummins, MD PhD Stroke Neurology 07/31/2023 7:14 PM

## 2023-07-30 NOTE — Progress Notes (Addendum)
 STROKE TEAM PROGRESS NOTE   BRIEF HPI [name] is a 58 y.o. male with history of arthritis, bipolar disorder, renal cancer, chronic kidney disease, dementia, depression, hiatal hernia, kidney stones, hyperlipidemia, hypertension, PTSD, sleep apnea and stroke presenting with hypertension and headache.  Head CT revealed small cerebellar ICH.  Patient has been transferred here to the ICU, and blood pressure has been under good control with Cleviprex  able to be discontinued.  NIH on Admission 1   SIGNIFICANT HOSPITAL EVENTS 1/8-patient admitted to the ICU  INTERIM HISTORY/SUBJECTIVE  Patient remains hemodynamically stable with occasional increases in blood pressure requiring as needed IV medication.  His neurological exam remains stable.  Will adjust p.o. blood pressure medications today.  OBJECTIVE  CBC    Component Value Date/Time   WBC 8.2 07/29/2023 0540   RBC 4.82 07/29/2023 0540   HGB 14.4 07/29/2023 0540   HCT 43.5 07/29/2023 0540   PLT 232 07/29/2023 0540   MCV 90.2 07/29/2023 0540   MCV 89.2 12/21/2014 1722   MCH 29.9 07/29/2023 0540   MCHC 33.1 07/29/2023 0540   RDW 14.5 07/29/2023 0540   LYMPHSABS 2.0 07/29/2023 0540   MONOABS 0.5 07/29/2023 0540   EOSABS 0.2 07/29/2023 0540   BASOSABS 0.1 07/29/2023 0540    BMET    Component Value Date/Time   NA 136 07/29/2023 0540   NA 141 04/01/2022 1013   K 3.5 07/29/2023 0540   CL 101 07/29/2023 0540   CO2 24 07/29/2023 0540   GLUCOSE 97 07/29/2023 0540   BUN 18 07/29/2023 0540   BUN 19 04/01/2022 1013   CREATININE 1.31 (H) 07/29/2023 0540   CREATININE 1.45 (H) 08/13/2018 0802   CREATININE 1.45 (H) 08/13/2018 0802   CALCIUM  8.7 (L) 07/29/2023 0540   EGFR 52 (L) 04/01/2022 1013   GFRNONAA >60 07/29/2023 0540   GFRNONAA 54 (L) 08/13/2018 0802    IMAGING past 24 hours MR BRAIN W WO CONTRAST Result Date: 07/30/2023 CLINICAL DATA:  58 year old male with small left cerebellar hemorrhage, presenting with  headache. EXAM: MRI HEAD WITHOUT AND WITH CONTRAST TECHNIQUE: Multiplanar, multiecho pulse sequences of the brain and surrounding structures were obtained without and with intravenous contrast. CONTRAST:  10mL GADAVIST  GADOBUTROL  1 MMOL/ML IV SOLN COMPARISON:  CT head, CTA head and neck yesterday. Prior brain MRI 09/26/2019. FINDINGS: Brain: Hemosiderin associated susceptibility artifact on diffusion imaging, including corresponding to the recent cerebellar hemorrhage on series 2, image 13. No convincing restricted diffusion, evidence of acute infarction. On SWI numerous areas of chronic hemorrhage in the brain are noted, with progression since 2021 not only in the bilateral cerebellum, but also in the brainstem (series 7, image 35), the bilateral thalami (image 53) and and the left anterior deep white matter (image 51). Chronic hemorrhagic lacunar infarct of the posterior right external capsule is chronic and stable. The pattern more resembles advanced small vessel disease than amyloid angiopathy. Mild T2 and FLAIR hyperintense edema surrounding the small left cerebellar hemorrhage which appears stable or smaller (series 6, images 10 and 11). No posterior fossa mass effect. Other Patchy and confluent, widespread bilateral white matter T2 and FLAIR hyperintensity does not appear significantly changed from 2021. T2 heterogeneity in the deep gray matter and the brainstem has progressed, including chronic but new linear lacunar infarct in the pons series 4, image 11. No abnormal enhancement identified. No dural thickening. No midline shift, mass effect, evidence of mass lesion, ventriculomegaly, extra-axial fluid. Cervicomedullary junction and pituitary are within normal limits.  Vascular: Major intracranial vascular flow voids are stable. Generalized intracranial artery dolichoectasia again noted. Following contrast the major dural venous sinuses are enhancing and appear to be patent. Skull and upper cervical spine:  Negative for age visible cervical spine. Visualized bone marrow signal is within normal limits. Sinuses/Orbits: Negative. Other: Mastoids are clear. Visible internal auditory structures appear normal. Negative visible scalp and face. IMPRESSION: 1. Stable or smaller subcentimeter left cerebellar hemorrhage with minor associated cerebellar edema and no mass effect. 2. Progressed since 2021 and severe chronic small vessel disease, with numerous chronic microhemorrhages throughout the brain. Doubt amyloid angiopathy in this age group, and there are chronic lacunar infarcts in the cerebral white matter, the bilateral deep gray nuclei, and the pons. Electronically Signed   By: VEAR Hurst M.D.   On: 07/30/2023 06:39    Vitals:   07/30/23 1000 07/30/23 1100 07/30/23 1200 07/30/23 1300  BP: (!) 151/89 (!) 148/96 (!) 149/48 (!) 145/94  Pulse: 72 64 64   Resp: 17 (!) 21  12  Temp:   98.2 F (36.8 C)   TempSrc:   Oral   SpO2: 92% 94%       PHYSICAL EXAM General:  Alert, well-nourished, well-developed patient in no acute distress Psych:  Mood and affect appropriate for situation CV: Regular rate and rhythm on monitor Respiratory:  Regular, unlabored respirations on room air GI: Abdomen soft and nontender   NEURO:  Mental Status: AA&Ox3, patient is able to give clear and coherent history Speech/Language: speech is without dysarthria or aphasia.    Cranial Nerves:  II: PERRL. Visual fields full.  III, IV, VI: EOMI. Eyelids elevate symmetrically.  V: Sensation is intact to light touch and symmetrical to face.  VII: Face is symmetrical resting and smiling VIII: hearing intact to voice. IX, X:Phonation is normal.  XII: tongue is midline without fasciculations. Motor: Able to move all 4 extremities with good antigravity strength, left leg slightly weaker than right Tone: is normal and bulk is normal Sensation- Intact to light touch bilaterally but diminished on the left Coordination: FTN intact  bilaterally Gait- deferred  Most Recent NIH  1a Level of Conscious.: 0 1b LOC Questions: 0 1c LOC Commands: 0 2 Best Gaze: 0 3 Visual: 0 4 Facial Palsy:0  5a Motor Arm - left: 0 5b Motor Arm - Right: 0 6a Motor Leg - Left: 0 6b Motor Leg - Right: 0 7 Limb Ataxia: 0 8 Sensory: 1 9 Best Language: 0 10 Dysarthria: 0 11 Extinct. and Inatten.: 0 TOTAL: 1   ASSESSMENT/PLAN  ICH: Subcentimeter left cerebellar hemorrhage, etiology: Likely due to hypertensive emergency CT head 9 mm hemorrhage in left cerebellum Repeat CT head 1/8 no extension of subcentimeter left deep cerebellar hemorrhage, chronic small vessel disease CTA head & neck no spot sign aneurysm or AVM at site of left cerebellar hemorrhage, generalized arterial ectasia and tortuosity in head and neck, no LVO MRI stable left cerebellar hemorrhage, chronic small vessel disease with numerous microhemorrhages, likely hypertensive in nature 2D Echo EF 60 to 65%  LDL 89 HgbA1c 5.8 UDS + for THC VTE prophylaxis -heparin  subq No antithrombotic prior to admission, now on No antithrombotic secondary to cerebellar IPH Therapy recommendations:  No follow up needed  Disposition: pending  Hx of stroke 09/2017 right BG infarct due to left sided weakness and numbness. MRI also showed old right thalamic, pontine and cerebellar infarcts. MRA, CUS unremarkable. EF 60-65%, LDL 126 and A1c 5.3, d/c on ASA and statin  Hypertension Home meds: Hydralazine  100 mg 3 times daily, losartan  100 mg daily, nebivolol  20 mg daily Largely stable with some hypertensive episodes requiring as needed IV medications Was hypotensive with hydralazine  100mg  on 1/8 Resume nebivolol  and losartan  Off Cleviprex  BP goal < 160 Long term BP goal normotensive  Hyperlipidemia Home meds: Atorvastatin  40 mg daily LDL 89, goal < 70 Increase Lipitor  to 80 mg at discharge Continue statin on discharge  Substance Abuse Patient uses marijuana UDS positive for  THC   Marijuana cessation provided  Other Stroke Risk Factors OSA  Other Active Problems PTSD Bipolar  CKD IIIa Cre 1.31 Hx of renal benign tumor s/p removal  DJD  Hospital day # 1  Patient seen by NP with MD, MD to edit note as needed. Cortney E Everitt Clint Kill , MSN, AGACNP-BC [facility] See Amion for schedule and pager information 07/30/2023 2:22 PM  ATTENDING NOTE: I reviewed above note and agree with the assessment and plan. Pt was seen and examined.   No family at bedside.  Patient lying bed, neuro intact, left finger-to-nose mild dysmetria has resolved.  BP still fluctuate, yesterday hypotensive after hydralazine  100 mg.  Overnight needed hydralazine  IV for hypertension.  Now continue Bystolic , add home losartan  50.  BP goal less than 160.  Resume home statin but increase to 80 mg daily on discharge, likely tomorrow.  PT and OT no recommendation.  For detailed assessment and plan, please refer to above/below as I have made changes wherever appropriate.   Ary Cummins, MD PhD Stroke Neurology 07/30/2023 5:15 PM  This patient is critically ill due to cerebellar ICH and at significant risk of neurological worsening, death form hematoma expansion and brain herniation. This patient's care requires constant monitoring of vital signs, hemodynamics, respiratory and cardiac monitoring, review of multiple databases, neurological assessment, discussion with family, other specialists and medical decision making of high complexity. I spent 30 minutes of neurocritical care time in the care of this patient.   To contact Stroke Continuity provider, please refer to [email or web address]. After hours, contact General Neurology

## 2023-07-30 NOTE — Evaluation (Signed)
 Physical Therapy Evaluation Patient Details Name: Jake Brown MRN: [id number] DOB: Jul 26, 1965 Today's Date: 07/30/2023  History of Present Illness  [name] is a 58 y.o. male who presented with HTN and headache. Head CT revealed small cerebellar ICH. PMHx: arthritis, bipolar disorder, renal cancer, chronic kidney disease, dementia, depression, hiatal hernia, kidney stones, hyperlipidemia, hypertension, PTSD, sleep apnea and stroke  Clinical Impression  Pt is at or close to baseline functioning and should be safe at home without assist. There are no further acute PT needs.  Will sign off at this time.         If plan is discharge home, recommend the following:  (PRN assist)   Can travel by private vehicle        Equipment Recommendations None recommended by PT  Recommendations for Other Services       Functional Status Assessment Patient has had a recent decline in their functional status and demonstrates the ability to make significant improvements in function in a reasonable and predictable amount of time.     Precautions / Restrictions Precautions Precautions: Fall Precaution Comments: chronic L hemiplegia Restrictions Weight Bearing Restrictions Per Provider Order: No      Mobility  Bed Mobility Overal bed mobility: Modified Independent                  Transfers Overall transfer level: Modified independent Equipment used: Straight cane                    Ambulation/Gait Ambulation/Gait assistance: Modified independent (Device/Increase time) Gait Distance (Feet): 400 Feet Assistive device: Straight cane Gait Pattern/deviations: Step-through pattern   Gait velocity interpretation: 1.31 - 2.62 ft/sec, indicative of limited community ambulator   General Gait Details: Steady with cane.  Weak foot drop with lower steppage gait on the L.  Discussed external AFO for higher level activities  Stairs            Wheelchair  Mobility     Tilt Bed    Modified Rankin (Stroke Patients Only)       Balance Overall balance assessment: Mild deficits observed, not formally tested                                           Pertinent Vitals/Pain Pain Assessment Pain Assessment: Faces Faces Pain Scale: Hurts a little bit Pain Location: back Pain Descriptors / Indicators: Discomfort Pain Intervention(s): Monitored during session    Home Living Family/patient expects to be discharged to:: Private residence Living Arrangements: Alone Available Help at Discharge: Friend(s);Available PRN/intermittently Type of Home: Apartment Home Access: Stairs to enter Entrance Stairs-Rails: None Entrance Stairs-Number of Steps: 1   Home Layout: One level Home Equipment: Agricultural Consultant (2 wheels);Cane - single point Additional Comments: has a dog name pinto bean    Prior Function Prior Level of Function : Independent/Modified Independent;Driving;History of Falls (last six months)             Mobility Comments: ambulates with SPC, chronic L foot drop. does not wear brace. denies recent falls. walks dog several times a day ADLs Comments: mod I ADLs, has a house cleaner otherwise indep IADLs. Drives. does not work.     Extremity/Trunk Assessment   Upper Extremity Assessment Upper Extremity Assessment: Defer to OT evaluation (clumbsy, incoordination L hand)    Lower Extremity Assessment Lower Extremity Assessment: LLE  deficits/detail LLE Deficits / Details: weak df /foot drop, general weakness, but generally functional    Cervical / Trunk Assessment Cervical / Trunk Assessment: Normal  Communication   Communication Communication: No apparent difficulties  Cognition Arousal: Alert Behavior During Therapy: WFL for tasks assessed/performed, Flat affect Overall Cognitive Status: Within Functional Limits for tasks assessed                                          General  Comments      Exercises     Assessment/Plan    PT Assessment Patient does not need any further PT services  PT Problem List         PT Treatment Interventions      PT Goals (Current goals can be found in the Care Plan section)  Acute Rehab PT Goals PT Goal Formulation: All assessment and education complete, DC therapy    Frequency       Co-evaluation               AM-PAC PT 6 Clicks Mobility  Outcome Measure Help needed turning from your back to your side while in a flat bed without using bedrails?: None Help needed moving from lying on your back to sitting on the side of a flat bed without using bedrails?: None Help needed moving to and from a bed to a chair (including a wheelchair)?: None Help needed standing up from a chair using your arms (e.g., wheelchair or bedside chair)?: None Help needed to walk in hospital room?: None Help needed climbing 3-5 steps with a railing? : A Little 6 Click Score: 23    End of Session   Activity Tolerance: Patient tolerated treatment well Patient left: in bed;with call bell/phone within reach Nurse Communication: Mobility status PT Visit Diagnosis: Other abnormalities of gait and mobility (R26.89)    Time: 1128-1150 PT Time Calculation (min) (ACUTE ONLY): 22 min   Charges:   PT Evaluation $PT Eval Low Complexity: 1 Low   PT General Charges $$ ACUTE PT VISIT: 1 Visit         07/30/2023  India HERO., PT Acute Rehabilitation Services [phone]  (office)  Vinie GAILS Alejah Aristizabal 07/30/2023, 12:11 PM

## 2023-07-31 ENCOUNTER — Encounter (HOSPITAL_COMMUNITY): Payer: Self-pay | Admitting: Neurology

## 2023-07-31 ENCOUNTER — Other Ambulatory Visit (HOSPITAL_COMMUNITY): Payer: Self-pay

## 2023-07-31 ENCOUNTER — Encounter: Payer: Self-pay | Admitting: Neurology

## 2023-07-31 DIAGNOSIS — I614 Nontraumatic intracerebral hemorrhage in cerebellum: Secondary | ICD-10-CM | POA: Diagnosis not present

## 2023-07-31 LAB — BASIC METABOLIC PANEL
Anion gap: 9 (ref 5–15)
BUN: 22 mg/dL — ABNORMAL HIGH (ref 6–20)
CO2: 23 mmol/L (ref 22–32)
Calcium: 9.3 mg/dL (ref 8.9–10.3)
Chloride: 106 mmol/L (ref 98–111)
Creatinine, Ser: 1.48 mg/dL — ABNORMAL HIGH (ref 0.61–1.24)
GFR, Estimated: 54 mL/min — ABNORMAL LOW (ref >60)
Glucose, Bld: 117 mg/dL — ABNORMAL HIGH (ref 70–99)
Potassium: 3.8 mmol/L (ref 3.5–5.1)
Sodium: 138 mmol/L (ref 135–145)

## 2023-07-31 MED ORDER — NEBIVOLOL HCL 20 MG PO TABS
20.0000 mg | ORAL_TABLET | Freq: Every day | ORAL | 0 refills | Status: DC
  Filled 2023-07-31: qty 30, 30d supply, fill #0

## 2023-07-31 MED ORDER — HYDRALAZINE HCL 50 MG PO TABS
50.0000 mg | ORAL_TABLET | Freq: Two times a day (BID) | ORAL | 0 refills | Status: DC
  Filled 2023-07-31: qty 60, 30d supply, fill #0

## 2023-07-31 MED ORDER — FENTANYL 12 MCG/HR TD PT72: 1.0000 | MEDICATED_PATCH | TRANSDERMAL | Status: DC

## 2023-07-31 MED ORDER — LOSARTAN POTASSIUM 50 MG PO TABS
50.0000 mg | ORAL_TABLET | Freq: Two times a day (BID) | ORAL | 0 refills | Status: DC
  Filled 2023-07-31: qty 60, 30d supply, fill #0

## 2023-07-31 MED ORDER — HYDRALAZINE HCL 50 MG PO TABS
50.0000 mg | ORAL_TABLET | Freq: Two times a day (BID) | ORAL | Status: DC
Start: 2023-07-31 — End: 2023-07-31

## 2023-07-31 MED ORDER — FENTANYL 12 MCG/HR TD PT72
1.0000 | MEDICATED_PATCH | Freq: Once | TRANSDERMAL | Status: DC
  Administered 2023-07-31: 1 via TRANSDERMAL
  Filled 2023-07-31: qty 1

## 2023-07-31 MED ORDER — HYDRALAZINE HCL 25 MG PO TABS
25.0000 mg | ORAL_TABLET | Freq: Three times a day (TID) | ORAL | Status: DC
Start: 2023-07-31 — End: 2023-07-31
  Administered 2023-07-31: 25 mg via ORAL
  Filled 2023-07-31: qty 1

## 2023-07-31 MED ORDER — PNEUMOCOCCAL 20-VAL CONJ VACC 0.5 ML IM SUSY
0.5000 mL | PREFILLED_SYRINGE | INTRAMUSCULAR | Status: AC
  Administered 2023-07-31: 0.5 mL via INTRAMUSCULAR
  Filled 2023-07-31: qty 0.5

## 2023-07-31 MED ORDER — LOSARTAN POTASSIUM 50 MG PO TABS: 50.0000 mg | ORAL_TABLET | Freq: Two times a day (BID) | ORAL | Status: DC

## 2023-07-31 NOTE — Progress Notes (Signed)
 Pt to discharge home driven by neighbor. AVS printed and reviewed with patient. Medications and next dose education given. BEFAST/Stroke education provided to patient. Heart failure education provided to patient. Education given to call 911 with any stroke, chest pain, heart failure symptoms. PIV removed tolerated well.  Idell Pouch, RN

## 2023-07-31 NOTE — Plan of Care (Signed)
  Problem: Clinical Measurements: Goal: Diagnostic test results will improve Outcome: Progressing Goal: Cardiovascular complication will be avoided Outcome: Progressing   Problem: Activity: Goal: Risk for activity intolerance will decrease Outcome: Progressing   Problem: Nutrition: Goal: Adequate nutrition will be maintained Outcome: Progressing   Problem: Coping: Goal: Level of anxiety will decrease Outcome: Progressing   Problem: Pain Management: Goal: General experience of comfort will improve Outcome: Progressing
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
## 2023-07-31 NOTE — Plan of Care (Signed)
 Notified by nursing of high BP; HR too low for labetalol , PRN hydral already given Give next due dose of PRN oxycodone  Restart home fentanyl  patch Restart home hydralazine  at reduced dose of 25 mg TID, increase to 100 mg TID gradually as tolerated  Will try to avoid restarting clevi but order still in place if needed.   Lola Jernigan MD-PhD [facility] [phone] Available 7 PM to 7 AM, outside of these hours please call Neurologist on call as listed on Amion.

## 2023-08-03 ENCOUNTER — Telehealth: Payer: Self-pay | Admitting: *Deleted

## 2023-08-03 NOTE — Transitions of Care (Post Inpatient/ED Visit) (Signed)
 08/03/2023  Name: Jake Brown MRN: [id number] DOB: August 16, 1965  Today's TOC FU Call Status: Today's TOC FU Call Status:: Successful TOC FU Call Completed TOC FU Call Complete Date: 08/03/23 Patient's Name and Date of Birth confirmed.  Transition Care Management Follow-up Telephone Call Date of Discharge: 07/31/23 Discharge Facility: [facility]) Type of Discharge: Inpatient Admission Primary Inpatient Discharge Diagnosis:: Nontraumatic cerebellar hemorrhage [facility]) How have you been since you were released from the hospital?: Better (just feel tired/today BP 149/107) Any questions or concerns?: No  Items Reviewed: Did you receive and understand the discharge instructions provided?: Yes Medications obtained,verified, and reconciled?: Yes (Medications Reviewed) Any new allergies since your discharge?: No Dietary orders reviewed?: No Do you have support at home?: Yes People in Home: friend(s) Name of Support/Comfort Primary Source: Reena sister  Medications Reviewed Today: Medications Reviewed Today     Reviewed by Kennieth Cathlean DEL, RN (Case Manager) on 08/03/23 at 1523  Med List Status: <None>   Medication Order Taking? Sig Documenting Provider Last Dose Status Informant  atorvastatin  (LIPITOR ) 40 MG tablet [id number] Yes TAKE ONE TABLET BY MOUTH DAILY AT 6 P.M. Gretel App, NP Taking Active Self, Pharmacy Records  baclofen  (LIORESAL ) 10 MG tablet [id number] Yes Take 10 mg by mouth 3 (three) times daily as needed for muscle spasms. [provider] Taking Active Self, Pharmacy Records  buPROPion  (WELLBUTRIN  XL) 300 MG 24 hr tablet [id number] No Take 1 tablet (300 mg total) by mouth daily.  Patient not taking: Reported on 08/03/2023   Gretel App, NP Not Taking Active Self, Pharmacy Records  Cholecalciferol  (VITAMIN D3) 125 MCG (5000 UT) CAPS [id number] Yes Take 1 capsule (5,000 Units total) by mouth daily. Gretel App, NP Taking Active  Self, Pharmacy Records  Cyanocobalamin  (B-12) 5000 MCG CAPS [id number] Yes Take 1 capsule by mouth daily. Patient takes in a gummy form. [provider] Taking Active Self, Pharmacy Records  fentaNYL  (DURAGESIC ) 12 MCG/HR [id number] Yes Place 1 patch onto the skin every 3 (three) days. Patch was applied on 07/27/2023. [provider] Taking Active Self, Pharmacy Records  gabapentin  (NEURONTIN ) 300 MG capsule [id number] Yes Take 1 capsule (300 mg total) by mouth 3 (three) times daily. Viviann Pastor, MD Taking Active Self, Pharmacy Records  hydrALAZINE  (APRESOLINE ) 50 MG tablet [id number] Yes Take 1 tablet (50 mg total) by mouth 2 (two) times daily. Remi Pippin, NP Taking Active   losartan  (COZAAR ) 50 MG tablet [id number] Yes Take 1 tablet (50 mg total) by mouth 2 (two) times daily. Remi Pippin, NP Taking Active   Nebivolol  HCl 20 MG TABS [id number] Yes Take 1 tablet (20 mg total) by mouth daily. Remi Pippin, NP Taking Active   oxyCODONE -acetaminophen  (PERCOCET/ROXICET) 5-325 MG tablet [id number] Yes Take 1-2 tablets by mouth every 4 (four) hours as needed for moderate pain. Mavis Purchase, MD Taking Active Self, Pharmacy Records            Home Care and Equipment/Supplies: Were Home Health Services Ordered?: NA Any new equipment or medical supplies ordered?: NA  Functional Questionnaire: Do you need assistance with bathing/showering or dressing?: No Do you need assistance with meal preparation?: No Do you need assistance with eating?: No Do you have difficulty maintaining continence: No Do you need assistance with getting out of bed/getting out of a chair/moving?: No Do you have difficulty managing or taking your medications?: No  Follow up appointments reviewed: PCP Follow-up appointment confirmed?: Yes Date of PCP follow-up appointment?: 08/11/23 Follow-up Provider:  Leron Glance NP Specialist Hospital Follow-up appointment confirmed?: No Reason Specialist  Follow-Up Not Confirmed: Patient has Specialist Provider Number and will Call for Appointment (Patient uinderstands to make F/u appt with Cardiology in 1 month and Neurology in 2 months) Do you need transportation to your follow-up appointment?: No Do you understand care options if your condition(s) worsen?: Yes-patient verbalized understanding  SDOH Interventions Today    Flowsheet Row Most Recent Value  SDOH Interventions   Food Insecurity Interventions Intervention Not Indicated  Housing Interventions Intervention Not Indicated  Transportation Interventions Intervention Not Indicated, Patient Resources (Friends/Family)  Utilities Interventions Intervention Not Indicated      Interventions Today    Flowsheet Row Most Recent Value  Chronic Disease   Chronic disease during today's visit Other  [Nontraumatic cerebellar hemorrhage]  General Interventions   General Interventions Discussed/Reviewed General Interventions Discussed, General Interventions Reviewed, Doctor Visits, Referral to Nurse  [facility] to Care Coordination nurse for follow up outreach calls]  Doctor Visits Discussed/Reviewed Doctor Visits Discussed, Doctor Visits Reviewed  Pharmacy Interventions   Pharmacy Dicussed/Reviewed Pharmacy Topics Discussed, Pharmacy Topics Reviewed       Goals Addressed             This Visit's Progress    30 Day TOC Program       Current Barriers:  Knowledge Deficits related to plan of care for management of   Nontraumatic cerebellar hemorrhage   RNCM Clinical Goal(s):  Patient will work with the Care Management team over the next 30 days to address Transition of Care Barriers: Medication Management take all medications exactly as prescribed and will call provider for medication related questions as evidenced by EMR attend all scheduled medical appointments: PCP and Specialist as evidenced by EMR  through collaboration with RN Care manager, provider, and care team.    Interventions: Evaluation of current treatment plan related to  self management and patient's adherence to plan as established by provider  Transitions of Care:  New goal. Doctor Visits  - discussed the importance of doctor visits Referral to Longitudinal Nurse Case Manager for Ongoing follow-up  Patient Goals/Self-Care Activities: Participate in Transition of Care Program/Attend [facility] scheduled calls Notify RN Care Manager of [facility] call rescheduling needs Take all medications as prescribed Attend all scheduled provider appointments Call pharmacy for medication refills 3-7 days in advance of running out of medications  Follow Up Plan:  Telephone follow up appointment with care management team member scheduled for:  Wanda Balboa 08/12/2023 11:00 Next PCP appointment scheduled for: [id number] 11:00       Patient has consented to follow up outreach calls with [facility]  Cathlean Headland BSN RN Population Health- Transition of Care Team.  [facility] [phone]

## 2023-08-04 ENCOUNTER — Other Ambulatory Visit (HOSPITAL_COMMUNITY): Payer: Self-pay | Admitting: Urology

## 2023-08-04 DIAGNOSIS — R319 Hematuria, unspecified: Secondary | ICD-10-CM

## 2023-08-11 ENCOUNTER — Encounter: Payer: Self-pay | Admitting: Nurse Practitioner

## 2023-08-11 ENCOUNTER — Ambulatory Visit (INDEPENDENT_AMBULATORY_CARE_PROVIDER_SITE_OTHER): Payer: Medicare Other | Admitting: Nurse Practitioner

## 2023-08-11 VITALS — BP 138/84 | HR 63 | Temp 98.2°F | Ht 68.0 in | Wt 277.8 lb

## 2023-08-11 DIAGNOSIS — I10 Essential (primary) hypertension: Secondary | ICD-10-CM

## 2023-08-11 DIAGNOSIS — N1831 Chronic kidney disease, stage 3a: Secondary | ICD-10-CM | POA: Diagnosis not present

## 2023-08-11 DIAGNOSIS — I614 Nontraumatic intracerebral hemorrhage in cerebellum: Secondary | ICD-10-CM | POA: Diagnosis not present

## 2023-08-11 DIAGNOSIS — F339 Major depressive disorder, recurrent, unspecified: Secondary | ICD-10-CM | POA: Diagnosis not present

## 2023-08-11 DIAGNOSIS — Z85528 Personal history of other malignant neoplasm of kidney: Secondary | ICD-10-CM

## 2023-08-11 DIAGNOSIS — G4733 Obstructive sleep apnea (adult) (pediatric): Secondary | ICD-10-CM

## 2023-08-11 LAB — BASIC METABOLIC PANEL
BUN: 21 mg/dL (ref 6–23)
CO2: 28 meq/L (ref 19–32)
Calcium: 8.7 mg/dL (ref 8.4–10.5)
Chloride: 106 meq/L (ref 96–112)
Creatinine, Ser: 1.57 mg/dL — ABNORMAL HIGH (ref 0.40–1.50)
GFR: 47.84 mL/min — ABNORMAL LOW (ref >60.00)
Glucose, Bld: 96 mg/dL (ref 70–99)
Potassium: 4.1 meq/L (ref 3.5–5.1)
Sodium: 141 meq/L (ref 135–145)

## 2023-08-11 MED ORDER — HYDRALAZINE HCL 50 MG PO TABS: 50.0000 mg | ORAL_TABLET | Freq: Three times a day (TID) | ORAL | 5 refills | Status: DC

## 2023-08-11 MED ORDER — BUPROPION HCL ER (XL) 150 MG PO TB24: 150.0000 mg | ORAL_TABLET | Freq: Every day | ORAL | 0 refills | Status: DC

## 2023-08-11 MED ORDER — NEBIVOLOL HCL 20 MG PO TABS: 20.0000 mg | ORAL_TABLET | Freq: Every day | ORAL | 5 refills | Status: AC

## 2023-08-11 NOTE — Progress Notes (Signed)
Bethanie Dicker, NP-C Phone: [phone]  ENRRIQUE PURI is a 58 y.o. male who presents today for hospital follow up.   Discussed the use of AI scribe software for clinical note transcription with the patient, who gave verbal consent to proceed.  History of Present Illness   The patient, with a history of hypertension, intracerebral hemorrhage, and sleep apnea, presents for a hospital follow-up. He reports feeling "a little depressed" after experiencing another stroke on 07/29/2023. The patient notes some difficulty with recall and finding words mid-sentence, which he attributes to the stroke.  The patient acknowledges inconsistent adherence to his blood pressure medication regimen, which may have contributed to his elevated blood pressure readings. He has been monitoring his blood pressure at home, with readings ranging from 124/83 to 163/114.  The patient also has a history of sleep apnea but has struggled with consistent use of his CPAP machine. He expresses a willingness to explore different mask options to improve compliance.  The patient has been taking three different blood pressure medications, but there seems to be some confusion about the exact regimen. He has been documenting his medication intake but is unsure of the names of the medications.  The patient also reports a history of chronic back pain and is currently under the care of a pain management specialist. He underwent back surgery nine months ago and continues to experience daily pain. The surgeon has advised that the healing process is ongoing and that the body is still adjusting to the hardware.  The patient also takes Wellbutrin for mood management but admits to inconsistent use. He expresses a willingness to restart the medication at a lower dose. He has previously sought counseling and is open to exploring behavioral health options.      Social History   Tobacco Use  Smoking Status Former   Current packs/day: 0.00    Average packs/day: 1 pack/day for 20.0 years (20.0 ttl pk-yrs)   Types: Cigarettes   Start date: 07/21/1973   Quit date: 07/21/1993   Years since quitting: 30.0  Smokeless Tobacco Former    Current Outpatient Medications on File Prior to Visit  Medication Sig Dispense Refill   atorvastatin (LIPITOR) 40 MG tablet TAKE ONE TABLET BY MOUTH DAILY AT 6 P.M. 90 tablet 3   baclofen (LIORESAL) 10 MG tablet Take 10 mg by mouth 3 (three) times daily as needed for muscle spasms.     Cholecalciferol (VITAMIN D3) 125 MCG (5000 UT) CAPS Take 1 capsule (5,000 Units total) by mouth daily. 90 capsule 3   Cyanocobalamin (B-12) 5000 MCG CAPS Take 1 capsule by mouth daily. Patient takes in a gummy form.     fentaNYL (DURAGESIC) 12 MCG/HR Place 1 patch onto the skin every 3 (three) days. Patch was applied on 07/27/2023.     gabapentin (NEURONTIN) 300 MG capsule Take 1 capsule (300 mg total) by mouth 3 (three) times daily. 90 capsule 0   oxyCODONE-acetaminophen (PERCOCET/ROXICET) 5-325 MG tablet Take 1-2 tablets by mouth every 4 (four) hours as needed for moderate pain. 30 tablet 0   No current facility-administered medications on file prior to visit.    ROS see history of present illness  Objective  Physical Exam Vitals:   08/11/23 1058 08/11/23 1136  BP: (!) 146/80 138/84  Pulse: 63   Temp: 98.2 F (36.8 C)   SpO2: 100%     BP Readings from Last 3 Encounters:  08/11/23 138/84  07/31/23 118/69  07/29/23 134/87   Wt Readings from  Last 3 Encounters:  08/11/23 277 lb 12.8 oz (126 kg)  07/31/23 257 lb 15 oz (117 kg)  07/28/23 262 lb (118.8 kg)    Physical Exam Constitutional:      General: He is not in acute distress.    Appearance: Normal appearance.  HENT:     Head: Normocephalic.  Cardiovascular:     Rate and Rhythm: Normal rate and regular rhythm.     Heart sounds: Normal heart sounds.  Pulmonary:     Effort: Pulmonary effort is normal.     Breath sounds: Normal breath sounds.   Skin:    General: Skin is warm and dry.  Neurological:     General: No focal deficit present.     Mental Status: He is alert.  Psychiatric:        Mood and Affect: Mood normal.        Behavior: Behavior normal.     Assessment/Plan: Please see individual problem list.  Left-sided nontraumatic intracerebral hemorrhage of cerebellum (HCC) Assessment & Plan: A recent left-sided intracerebral hemorrhage, on 07/29/23, has left no residual deficits, though there is some difficulty with word recall. Continue current management and follow up with neurology as scheduled.   Primary hypertension Assessment & Plan: Blood pressure readings are variable and elevated, likely contributing to the intracerebral hemorrhage. Medication use has been inconsistent, and there is a history of nephrectomy. Increase Hydralazine to 50mg  three times daily, as he has only been taking twice daily, and discontinue Losartan. He will continue Nebivolol 20mg  daily. Check kidney function today and refer to nephrology for further management of hypertension and kidney function. Arrange a meeting with a pharmacist to discuss pill packing for better medication adherence. He will follow up with Cardiology as scheduled. Encouraged to continue checking blood pressure daily at home and contact if continuing to remain consistently elevated.   Orders: -     Basic metabolic panel -     hydrALAZINE HCl; Take 1 tablet (50 mg total) by mouth 3 (three) times daily.  Dispense: 90 tablet; Refill: 5 -     Nebivolol HCl; Take 1 tablet (20 mg total) by mouth daily.  Dispense: 30 tablet; Refill: 5 -     Ambulatory referral to Nephrology -     AMB Referral VBCI Care Management  Stage 3a chronic kidney disease (HCC) Assessment & Plan: Lab work as outlined. We will refer to Nephrology for further evaluation. Hx renal cell cancer, right nephrectomy in 2001.   Orders: -     Basic metabolic panel -     Ambulatory referral to Nephrology -      AMB Referral VBCI Care Management  Depression, recurrent (HCC) Assessment & Plan: Currently on Wellbutrin XL 300 mg but use has been inconsistent, with reported mood difficulties and day sleeping. Reduce Wellbutrin XL to 150mg  daily and refer to behavioral health for counseling. Counseled on importance of medication adherence and taking consistently. Counseled patient on common side effects. Encouraged to contact if worsening symptoms, unusual behavior changes or suicidal thoughts occur.   Orders: -     buPROPion HCl ER (XL); Take 1 tablet (150 mg total) by mouth daily.  Dispense: 90 tablet; Refill: 0  OSA on CPAP Assessment & Plan: Diagnosed with severe sleep apnea in 2021 but has experiencing difficulty using CPAP. Encourage exploring different mask options at a medical supply store. Follow up with Pulmonology.    History of renal cell cancer -     Ambulatory referral to Nephrology  Return in 30 days (on 09/10/2023) for Follow up as previously scheduled.   Bethanie Dicker, NP-C [facility]

## 2023-08-11 NOTE — Patient Instructions (Addendum)
Please take your medications as prescribed- see attached medication list.   Hydralazine 50 mg three times daily (orange circle pill)  Nebivolol 20 mg once daily (blue circle pill)  I have placed referrals to Nephrology and our Pharmacist.  Please follow up with Cardiology and Neurology as scheduled.   Continue checking your blood pressure daily at home and contact if remaining consistently elevated over 160/90.

## 2023-08-11 NOTE — Assessment & Plan Note (Signed)
A recent left-sided intracerebral hemorrhage, on 07/29/23, has left no residual deficits, though there is some difficulty with word recall. Continue current management and follow up with neurology as scheduled.

## 2023-08-11 NOTE — Assessment & Plan Note (Signed)
Currently on Wellbutrin XL 300 mg but use has been inconsistent, with reported mood difficulties and day sleeping. Reduce Wellbutrin XL to 150mg  daily and refer to behavioral health for counseling. Counseled on importance of medication adherence and taking consistently. Counseled patient on common side effects. Encouraged to contact if worsening symptoms, unusual behavior changes or suicidal thoughts occur.

## 2023-08-11 NOTE — Assessment & Plan Note (Signed)
Lab work as outlined. We will refer to Nephrology for further evaluation. Hx renal cell cancer, right nephrectomy in 2001.

## 2023-08-11 NOTE — Assessment & Plan Note (Signed)
Blood pressure readings are variable and elevated, likely contributing to the intracerebral hemorrhage. Medication use has been inconsistent, and there is a history of nephrectomy. Increase Hydralazine to 50mg  three times daily, as he has only been taking twice daily, and discontinue Losartan. He will continue Nebivolol 20mg  daily. Check kidney function today and refer to nephrology for further management of hypertension and kidney function. Arrange a meeting with a pharmacist to discuss pill packing for better medication adherence. He will follow up with Cardiology as scheduled. Encouraged to continue checking blood pressure daily at home and contact if continuing to remain consistently elevated.

## 2023-08-11 NOTE — Assessment & Plan Note (Signed)
Diagnosed with severe sleep apnea in 2021 but has experiencing difficulty using CPAP. Encourage exploring different mask options at a medical supply store. Follow up with Pulmonology.

## 2023-08-12 ENCOUNTER — Other Ambulatory Visit: Payer: Self-pay

## 2023-08-12 DIAGNOSIS — N183 Chronic kidney disease, stage 3 unspecified: Secondary | ICD-10-CM | POA: Diagnosis not present

## 2023-08-12 DIAGNOSIS — I69354 Hemiplegia and hemiparesis following cerebral infarction affecting left non-dominant side: Secondary | ICD-10-CM | POA: Diagnosis not present

## 2023-08-12 DIAGNOSIS — I619 Nontraumatic intracerebral hemorrhage, unspecified: Secondary | ICD-10-CM | POA: Diagnosis not present

## 2023-08-12 DIAGNOSIS — I10 Essential (primary) hypertension: Secondary | ICD-10-CM | POA: Diagnosis not present

## 2023-08-12 NOTE — Patient Outreach (Signed)
Care Management  Transitions of Care Program Transitions of Care Post-discharge week 2   08/12/2023 Name: Jake Brown MRN: [id number] DOB: May 04, 1966  Subjective: Jake Brown is a 58 y.o. year old male who is a primary care patient of Bethanie Dicker, NP. The Care Management team Engaged with patient Engaged with patient by telephone to assess and address transitions of care needs.   Consent to Services:  Patient was given information about care management services, agreed to services, and gave verbal consent to participate.   Assessment: Outreach completed to the patient today. He states he has made a grid for his BP measurements and for taking his medication on time. He is open to packaged medication so he is more compliant. Contacted his pharmacy who does not provide this service. Recommendation to [facility]. [employer] Pack may be an option. The patient is having some minor problems with word recall and CM suggested Speech Therapy. The patient does drive and could go outpatient. He will think about it. Also discussed depression related to current medical condition. This is his second stroke in the last four weeks. Provided recommendations and resources for behavioral health. He states he will think about that. Chronic pain reviewed. The patient had back surgery in May of @024  and states he is still in pain. Reviewed medication regime. The patient has reminders on his phone to change his Duragesic patch every three days. He has an appointment with Urology today to review kidney function. RNCM will continue with weekly phone triage and review.          SDOH Interventions    Flowsheet Row Patient Outreach from 08/12/2023 in [place] POPULATION HEALTH DEPARTMENT Telephone from 08/03/2023 in [place] POPULATION HEALTH DEPARTMENT Office Visit from 06/10/2023 in [facility] Clinical Support from 08/28/2022 in [facility]  at [employer] Visit from 04/29/2022 in [facility] [place] [facility] [employer] Visit from 10/03/2020 in [facility] at [employer]  SDOH Interventions        Food Insecurity Interventions Intervention Not Indicated Intervention Not Indicated -- Intervention Not Indicated -- --  Housing Interventions Intervention Not Indicated Intervention Not Indicated -- Intervention Not Indicated -- --  Transportation Interventions Intervention Not Indicated Intervention Not Indicated, Patient Resources (Friends/Family) -- Intervention Not Indicated -- --  Utilities Interventions Intervention Not Indicated Intervention Not Indicated -- Other (Comment)  [Community referral placed] -- --  Alcohol Usage Interventions -- -- -- Intervention Not Indicated (Score <7) -- --  Depression Interventions/Treatment  -- -- Counseling, Medication Currently on Treatment PHQ2-9 Score <4 Follow-up Not Indicated Medication, Counseling  Financial Strain Interventions -- -- -- Other (Comment)  [Disabled ,Social Work Referal placed] -- --  Physical Activity Interventions -- -- -- Intervention Not Indicated -- --  Stress Interventions -- -- -- Other (Comment)  [Pt interested in therapy. Community referral placed.] -- --  Social Connections Interventions Patient Declined -- -- Intervention Not Indicated -- --        Goals Addressed             This Visit's Progress    30 Day TOC Program       Current Barriers: (Reviewed 08/12/23) Knowledge Deficits related to plan of care for management of   Nontraumatic cerebellar hemorrhage   RNCM Clinical Goal(s):  (Reviewed 08/12/23) Patient will work with the Care Management team over the next 30 days to address Transition of Care Barriers:  Medication access Medication Management Provider appointments take all medications exactly as prescribed and will call provider for medication related questions as evidenced by EMR attend all  scheduled medical appointments: PCP and Specialist as evidenced by EMR  through collaboration with RN Care manager, provider, and care team.   Interventions:  (Reviewed 08/12/23) Evaluation of current treatment plan related to  self management and patient's adherence to plan as established by provider  Transitions of Care:  New goal.   (Reviewed 08/12/23) Doctor Visits  - discussed the importance of doctor visits Referral to Longitudinal Nurse Case Manager for Ongoing follow-up  Patient Goals/Self-Care Activities:  (Reviewed 08/12/23) Participate in Transition of Care Program/Attend [facility] scheduled calls Notify RN Care Manager of [facility] call rescheduling needs Take all medications as prescribed Attend all scheduled provider appointments Call pharmacy for medication refills 3-7 days in advance of running out of medications Call provider office for new concerns or questions   Follow Up Plan:  [place] planned for 08/19/23 at 12 noon        Please refer to Care Plan for goals and interventions.  Patient educated on red flags signs/symptoms to watch for and was encouraged to report any of these identified, any new symptoms, changes in baseline or medication regimen, change in health status / well-being, or safety concerns to PCP and / or the [facility] Case Management team.  The patient has been provided with contact information for the care management team and has been advised to call with any health related questions or concerns.    Deidre Ala, RN Medical illustrator [facility]-Population [facility] [phone]

## 2023-08-12 NOTE — Patient Instructions (Signed)
Visit Information  Thank you for taking time to visit with me today. Please don't hesitate to contact me if I can be of assistance to you before our next scheduled telephone appointment.   Following is a copy of your care plan:   Goals Addressed             This Visit's Progress    30 Day TOC Program       Current Barriers: (Reviewed 08/12/23) Knowledge Deficits related to plan of care for management of   Nontraumatic cerebellar hemorrhage   RNCM Clinical Goal(s):  (Reviewed 08/12/23) Patient will work with the Care Management team over the next 30 days to address Transition of Care Barriers: Medication access Medication Management Provider appointments take all medications exactly as prescribed and will call provider for medication related questions as evidenced by EMR attend all scheduled medical appointments: PCP and Specialist as evidenced by EMR  through collaboration with RN Care manager, provider, and care team.   Interventions:  (Reviewed 08/12/23) Evaluation of current treatment plan related to  self management and patient's adherence to plan as established by provider  Transitions of Care:  New goal.   (Reviewed 08/12/23) Doctor Visits  - discussed the importance of doctor visits Referral to Longitudinal Nurse Case Manager for Ongoing follow-up  Patient Goals/Self-Care Activities:  (Reviewed 08/12/23) Participate in Transition of Care Program/Attend [facility] scheduled calls Notify RN Care Manager of [facility] call rescheduling needs Take all medications as prescribed Attend all scheduled provider appointments Call pharmacy for medication refills 3-7 days in advance of running out of medications Call provider office for new concerns or questions   Follow Up Plan:  [place] planned for 08/19/23 at 12 noon         Patient verbalizes understanding of instructions and care plan provided today and agrees to view in MyChart. Active MyChart status and patient understanding of how to  access instructions and care plan via MyChart confirmed with patient.     The patient has been provided with contact information for the care management team and has been advised to call with any health related questions or concerns.   Please call the care guide team at [phone] if you need to cancel or reschedule your appointment.   Please call the Suicide and Crisis Lifeline: 988 call the [place] National Suicide Prevention Lifeline: 831 569 8270 or TTY: (732)435-1728 TTY (573)868-6542) to talk to a trained counselor if you are experiencing a Mental Health or Behavioral Health Crisis or need someone to talk to.  Deidre Ala, RN Medical illustrator [facility]Population [facility] [phone]

## 2023-08-14 ENCOUNTER — Telehealth: Payer: Self-pay

## 2023-08-14 ENCOUNTER — Encounter: Payer: Self-pay | Admitting: Student

## 2023-08-14 DIAGNOSIS — I69354 Hemiplegia and hemiparesis following cerebral infarction affecting left non-dominant side: Secondary | ICD-10-CM

## 2023-08-14 NOTE — Transitions of Care (Post Inpatient/ED Visit) (Signed)
   08/14/2023  Name: Jake Brown MRN: [id number] DOB: 03-09-1966  Today's TOC FU Call Status:    Attempted to reach the patient regarding information regarding a pharmacy that participated in Compliance Packaging. The patient had expressed interest since he has not always be compliant and felt the pre-packing may be a benefit to him.   Follow Up Plan: Additional outreach attempts will be made to reach the patient to complete the Transitions of Care (Post Inpatient/ED visit) call.   Deidre Ala, BSN, RN [place]  [facility] - [employer] [facility] RN Care Manager [phone]

## 2023-08-17 ENCOUNTER — Other Ambulatory Visit: Payer: Self-pay | Admitting: Student

## 2023-08-17 DIAGNOSIS — I619 Nontraumatic intracerebral hemorrhage, unspecified: Secondary | ICD-10-CM

## 2023-08-17 DIAGNOSIS — I69354 Hemiplegia and hemiparesis following cerebral infarction affecting left non-dominant side: Secondary | ICD-10-CM

## 2023-08-18 ENCOUNTER — Telehealth: Payer: Self-pay

## 2023-08-18 NOTE — Progress Notes (Unsigned)
Care Guide Pharmacy Note  08/18/2023 Name: Jake Brown MRN: [id number] DOB: [birth year]  Referred By: Bethanie Dicker, NP Reason for referral: Care Coordination (Outreach to schedule with Pharm d )   Jake Brown is a 58 y.o. year old male who is a primary care patient of Bethanie Dicker, NP.  Jake Brown was referred to the pharmacist for assistance related to: HTN  An unsuccessful telephone outreach was attempted today to contact the patient who was referred to the pharmacy team for assistance with medication management. Additional attempts will be made to contact the patient.  Penne Lash , RMA     [facility]  [facility], [facility] Guide  Direct Dial: [phone]  Website: [email or web address]

## 2023-08-19 ENCOUNTER — Other Ambulatory Visit: Payer: Self-pay

## 2023-08-19 NOTE — Patient Instructions (Signed)
Visit Information  Thank you for taking time to visit with me today. Please don't hesitate to contact me if I can be of assistance to you before our next scheduled telephone appointment.  Our next appointment is by telephone on February 5th at 12noon  Following is a copy of your care plan:   Goals Addressed             This Visit's Progress    30 Day TOC Program       Current Barriers: (Reviewed 08/19/23) Knowledge Deficits related to plan of care for management of   Nontraumatic cerebellar hemorrhage   RNCM Clinical Goal(s):  (Reviewed 08/19/23) Patient will work with the Care Management team over the next 30 days to address Transition of Care Barriers: Medication access Medication Management Provider appointments take all medications exactly as prescribed and will call provider for medication related questions as evidenced by EMR attend all scheduled medical appointments: PCP and Specialist as evidenced by EMR  through collaboration with RN Care manager, provider, and care team.   Interventions:  (Reviewed 08/19/23) Evaluation of current treatment plan related to  self management and patient's adherence to plan as established by provider  Transitions of Care:  New goal.   (Reviewed 08/19/23) Doctor Visits  - discussed the importance of doctor visits Referral to Longitudinal Nurse Case Manager for Ongoing follow-up  Patient Goals/Self-Care Activities:  (Reviewed 08/19/23) Participate in Transition of Care Program/Attend [facility] scheduled calls Notify RN Care Manager of [facility] call rescheduling needs Take all medications as prescribed Attend all scheduled provider appointments Call pharmacy for medication refills 3-7 days in advance of running out of medications Call provider office for new concerns or questions   Follow Up Plan:  [place] planned for Wednesday February 5th at 12noon         Patient verbalizes understanding of instructions and care plan provided today and agrees to  view in [place]. Active MyChart status and patient understanding of how to access instructions and care plan via MyChart confirmed with patient.     The patient has been provided with contact information for the care management team and has been advised to call with any health related questions or concerns.   Please call the care guide team at [phone] if you need to cancel or reschedule your appointment.   Please call the Suicide and Crisis Lifeline: 988 call the [place] National Suicide Prevention Lifeline: 337-229-5640 or TTY: 857-349-0444 TTY 772-087-1435) to talk to a trained counselor if you are experiencing a Mental Health or Behavioral Health Crisis or need someone to talk to.  Deidre Ala, BSN, RN [place]  [facility] - [employer] [facility] RN Care Manager [phone]

## 2023-08-19 NOTE — Patient Outreach (Signed)
Care Management  Transitions of Care Program Transitions of Care Post-discharge week 3   08/19/2023 Name: Jake Brown MRN: [id number] DOB: 05/09/1966  Subjective: Jake Brown is a 58 y.o. year old male who is a primary care patient of Bethanie Dicker, NP. The Care Management team Engaged with patient Engaged with patient by telephone to assess and address transitions of care needs.   Consent to Services:  Patient was given information about care management services, agreed to services, and gave verbal consent to participate.   Assessment:  TOC Outreach completed to the patient today. Reviewed medications and BP readings which have been 145/83, 138/84, 146/80. He has established a routine of when he takes his medications and BP. A referral has been made to the [facility] pharmacy team for assistance. He reports not sleeping well and feels tired. Labs were taken last week and are WNL. He has follow up with a [facility]ologist in two weeks. RNCM to continue to follow for support and status.  SDOH Interventions    Flowsheet Row Patient Outreach from 08/12/2023 in [place] POPULATION HEALTH DEPARTMENT Telephone from 08/03/2023 in [place] POPULATION HEALTH DEPARTMENT Office Visit from 06/10/2023 in [facility] [place] [facility] at [facility] Clinical Support from 08/28/2022 in [facility] [place] [facility] at [employer] Visit from 04/29/2022 in [facility] [place] at [employer] Visit from 10/03/2020 in [facility] at [employer]  SDOH Interventions        Food Insecurity Interventions Intervention Not Indicated Intervention Not Indicated -- Intervention Not Indicated -- --  Housing Interventions Intervention Not Indicated Intervention Not Indicated -- Intervention Not Indicated -- --  Transportation Interventions Intervention Not Indicated Intervention Not Indicated, Patient Resources (Friends/Family) -- Intervention  Not Indicated -- --  Utilities Interventions Intervention Not Indicated Intervention Not Indicated -- Other (Comment)  [Community referral placed] -- --  Alcohol Usage Interventions -- -- -- Intervention Not Indicated (Score <7) -- --  Depression Interventions/Treatment  -- -- Counseling, Medication Currently on Treatment PHQ2-9 Score <4 Follow-up Not Indicated Medication, Counseling  Financial Strain Interventions -- -- -- Other (Comment)  [Disabled ,Social Work Referal placed] -- --  Physical Activity Interventions -- -- -- Intervention Not Indicated -- --  Stress Interventions -- -- -- Other (Comment)  [Pt interested in therapy. Community referral placed.] -- --  Social Connections Interventions Patient Declined -- -- Intervention Not Indicated -- --        Goals Addressed             This Visit's Progress    30 Day TOC Program       Current Barriers: (Reviewed 08/19/23) Knowledge Deficits related to plan of care for management of   Nontraumatic cerebellar hemorrhage   RNCM Clinical Goal(s):  (Reviewed 08/19/23) Patient will work with the Care Management team over the next 30 days to address Transition of Care Barriers: Medication access Medication Management Provider appointments take all medications exactly as prescribed and will call provider for medication related questions as evidenced by EMR attend all scheduled medical appointments: PCP and Specialist as evidenced by EMR  through collaboration with RN Care manager, provider, and care team.   Interventions:  (Reviewed 08/19/23) Evaluation of current treatment plan related to  self management and patient's adherence to plan as established by provider  Transitions of Care:  New goal.   (Reviewed 08/19/23) Doctor Visits  - discussed the importance of doctor visits Referral to Longitudinal Nurse Case Manager for  Ongoing follow-up  Patient Goals/Self-Care Activities:  (Reviewed 08/19/23) Participate in Transition of Care  Program/Attend [facility] scheduled calls Notify RN Care Manager of [facility] call rescheduling needs Take all medications as prescribed Attend all scheduled provider appointments Call pharmacy for medication refills 3-7 days in advance of running out of medications Call provider office for new concerns or questions   Follow Up Plan:  Out[place] planned for Wednesday February 5th at 12noon        Please refer to Care Plan for goals and interventions.  Patient educated on red flags signs/symptoms to watch for and was encouraged to report any of these identified, any new symptoms, changes in baseline or medication regimen, change in health status / well-being, or safety concerns to PCP and / or the [facility] Case Management team.   Patient is at high risk for readmission and/or has history of high utilization of ED.  Discussed [facility] TOC program and weekly calls to patient to assess condition/status, medication management and provide support/education as indicated. Patient/ Caregiver voiced understanding and is agreeable to 30 day program    Deidre Ala, BSN, RN [place]  [facility] - [facility] Health RN Care Manager [phone]

## 2023-08-20 NOTE — Progress Notes (Signed)
Care Guide Pharmacy Note  08/20/2023 Name: MUSTAF ANTONACCI MRN: [id number] DOB: 01-02-1966  Referred By: Bethanie Dicker, NP Reason for referral: Care Coordination (Outreach to schedule with Pharm d )   ELYJAH HAZAN is a 58 y.o. year old male who is a primary care patient of Bethanie Dicker, NP.  Erenest Rasher [place]ley was referred to the pharmacist for assistance related to: HTN  Successful contact was made with the patient to discuss pharmacy services including being ready for the pharmacist to call at least 5 minutes before the scheduled appointment time and to have medication bottles and any blood pressure readings ready for review. The patient agreed to meet with the pharmacist via telephone visit on (date/time).08/24/2023  Penne Lash , RMA     [place]  [facility], [facility] Guide  Direct Dial: [phone]  Website: [email or web address]

## 2023-08-24 ENCOUNTER — Other Ambulatory Visit (HOSPITAL_COMMUNITY): Payer: Self-pay

## 2023-08-24 ENCOUNTER — Other Ambulatory Visit: Payer: Medicare Other

## 2023-08-24 DIAGNOSIS — I618 Other nontraumatic intracerebral hemorrhage: Secondary | ICD-10-CM

## 2023-08-24 DIAGNOSIS — I10 Essential (primary) hypertension: Secondary | ICD-10-CM

## 2023-08-24 DIAGNOSIS — I639 Cerebral infarction, unspecified: Secondary | ICD-10-CM

## 2023-08-24 NOTE — Progress Notes (Unsigned)
08/24/2023 Name: Jake Brown MRN: [id number] DOB: [birth year]  Subjective  Chief Complaint  Patient presents with   Hypertension    Reason for visit: Jake Brown is a 58 y.o. year old male who presented for a telephone visit. They were referred to the pharmacist by their PCP for assistance in managing hypertension.  Pertinent PMH: Hx CVA (2019), silent micro-hemorrhage of the brain (2025), HTN, OSA in CPAP, CHF (EF 60 to 65%), CKD3, FLD, HLD, obesity, pre-diabetes  Care Team: Primary Care Provider: Bethanie Dicker, NP Cardiologist: Lawerance Sabal  Nephrologist: Referral placed, has not been contacted yet     Date of Last Hypertension Related Visit: 08/21/23 with PCP Summary of recent change: 1/21: The patient has been taking three different blood pressure medications, but there seems to be some confusion about the exact regimen. He has been documenting his medication intake but is unsure of the names of the medications. Stop losartan, increase hydralazine to TID dosing. 1/10: Hospitalization: [facility] - Subcentimeter left cerebellar hemorrhage, etiology: Likely due to hypertensive emergency  ?    Medication Adherence and Access: Prescription drug coverage: Payor: Payor: BLUE CROSS BLUE SHIELD MEDICARE / Plan: BCBS MEDICARE / Product Type: *No Product type* /  Denies missed doses of oral medications.  Since Last visit / History of Present Illness: ?  Patient reports implementing plan from hospital discharge. Since visit with PCP, has confirmed adjustment of HTN regimen as instructed (no longer taking losartan, has increased hydralazine from BID to TID dosing).   Reports taking medications around 6-7am. Second dose around noon. Evening dose around 6-7 pm.    Reported HTN/CHF Regimen: ?  Hydralazine 50 mg three times daily Nebivolol 20 mg daily (Taking in the morning)  HTN medications tried in the past:?  Nebivolol 20 mg daily (Taking in the morning)  Lifestyle Factors:   Diet/Sodium Intake: Reports cutting down/back on salt the last 20 years since nephrectomy. No salt shaker. Minimal canned/frozen meals. (reports 35 lb weight loss last year, though s/p spine surgery noted weight re-gain).  Exercise: Walks dog daily outside (2-3 times daily) Alcohol: No (quit drinking 32 years year) Caffeine: Drinks coffee (1-2 cups per day) Contributing Medications: N/A   SMBP: BP cuff at home: yes ([employer]) - His cuff is set to run 3 tests 30 seconds apart (has been trying to change) Regularly checking BP at home: yes    Recent home BP readings (Per BP meter):    This morning: Forgot am dose of BP medication ? 145/83 mmHg    1/22  138/84 mmHg    1/21  146/80 mmHg   BP medications taken today: yes (takes BP meds in the morning)    Cardiovascular Risk Reduction History of clinical ASCVD? yes History of heart failure? yes; Sees cardiology History of hyperlipidemia? yes History of chronic kidney disease? yes (s/p nephrectomy 2001) Current BMI: 42.1 kg/m2 (Ht 68 in, Wt 126 kg) Taking statin? yes; high intensity (atorvastatin 40 mg) Taking aspirin?  No. Stopped 2/2 [facility]    Taking SGLT-2i? no Taking GLP- 1 RA? no      _______________________________________________  Objective    Review of Systems:?  Constitutional:? No fever, chills or unintentional weight loss  Cardiovascular:? No chest pain or pressure, shortness of breath, dyspnea on exertion, orthopnea or LE edema  Pulmonary:? No cough or shortness of breath  GI:? No nausea, vomiting, constipation, diarrhea, abdominal pain, dyspepsia, change in bowel habits  Endocrine:? No polyuria, polyphagia or blurred vision  Psych:?  No depression, anxiety, insomnia    Physical Examination:  Vitals:  Wt Readings from Last 3 Encounters:  08/11/23 277 lb 12.8 oz (126 kg)  07/31/23 257 lb 15 oz (117 kg)  07/28/23 262 lb (118.8 kg)   BP Readings from Last 3 Encounters:  08/11/23 138/84  07/31/23 118/69  07/29/23 134/87    Pulse Readings from Last 3 Encounters:  08/11/23 63  07/31/23 (!) 53  07/29/23 93    Labs:?     Chemistry   Metabolic Risk Considerations  Lab Results  Component Value Date   NA 141 08/11/2023   K 4.1 08/11/2023   CL 106 08/11/2023   CO2 28 08/11/2023   BUN 21 08/11/2023   CREATININE 1.57 (H) 08/11/2023   CALCIUM 8.7 08/11/2023   MG 2.1 07/29/2023   Lab Results  Component Value Date   HGBA1C 5.6 07/29/2023   GLUCOSE 96 08/11/2023   MICRALBCREAT 1.3 10/26/2017   CREATININE 1.57 (H) 08/11/2023   CREATININE 1.48 (H) 07/31/2023   CREATININE 1.31 (H) 07/29/2023   GFRNONAA 54 (L) 07/31/2023   GFRNONAA >60 07/29/2023   GFRNONAA >60 07/28/2023    Lab Results  Component Value Date   CHOL 159 07/29/2023   LDLCALC 89 07/29/2023   LDLCALC 88 11/10/2022   LDLCALC 85 10/01/2021   HDL 50 07/29/2023   HDL 42.10 11/10/2022   HDL 46.30 10/01/2021   AST 20 07/29/2023   AST 14 11/10/2022   ALT 19 07/29/2023   ALT 12 11/10/2022     The ASCVD Risk score (Arnett DK, et al., 2019) failed to calculate for the following reasons:   Risk score cannot be calculated because patient has a medical history suggesting prior/existing ASCVD  Assessment and Plan:   1. Hypertension: Improving control per last last clinic reading of 138/84 mmHg (08/11/23) with goal <130/80 mmHg. SMBP readings consistent w clinic reading, usually 130s-140s. Denies s/sx hypo/hypertension.  Current Regimen: hydralazine 50 mg TID, nebivolol 50 mg daily  Diet/Sodium/Caffeine: Discussed continuing to adhere to sodium restriction in the setting of HTN and CHF. Feels he is doing well with this.  Exercise: Set a goal of increasing daily steps. Currently takes dog out a few times each day. Encouraged continuation of this, slowly increase distance, etc.   Adherence packaging: Feels he has been much better with his adherence and feels he has a good control on his compliance. We discussed adherence packaging as an option at  any point in the future if he feels if could be useful.  Future Consideration Nephrology referral placed 1/21. Advised pt to reach out if he has not heard from them in a couple more weeks Would possibly benefit from addition of more renal-protective therapies such as RAAS or SGLT2i, supervised by nephrology per hx nephrectomy Could consider trying to get coverage for GLP1-RA in the setting of elevated BMI and clinical ASCVD for CV risk reduction.    Follow Up Follow up with clinical pharmacist via *** in *** weeks.  ?   Future Appointments  Date Time Provider Department Center  08/26/2023 12:00 PM Homsher, Wynona Canes, RN [facility] None  08/31/2023  1:00 PM [facility] ANNUAL WELLNESS VISIT [facility]  09/04/2023  3:30 PM [facility]  09/10/2023  1:40 PM Bethanie Dicker, NP [facility]     Loree Fee, PharmD Clinical Pharmacist [facility] [phone]

## 2023-08-26 ENCOUNTER — Other Ambulatory Visit: Payer: Self-pay

## 2023-08-26 NOTE — Patient Instructions (Signed)
 Visit Information  Thank you for taking time to visit with me today. Please don't hesitate to contact me if I can be of assistance to you before our next scheduled telephone appointment.  Our next appointment is by telephone on Wednesday February 12th at 12noon  Following is a copy of your care plan:   Goals Addressed             This Visit's Progress    30 Day TOC Program       Current Barriers: (Reviewed 08/26/23) Knowledge Deficits related to plan of care for management of   Nontraumatic cerebellar hemorrhage   RNCM Clinical Goal(s):   (Reviewed 08/26/23) Patient will work with the Care Management team over the next 30 days to address Transition of Care Barriers: Medication access Medication Management Provider appointments take all medications exactly as prescribed and will call provider for medication related questions as evidenced by EMR attend all scheduled medical appointments: PCP and Specialist as evidenced by EMR  through collaboration with RN Care manager, provider, and care team.   Interventions:   (Reviewed 08/26/23) Evaluation of current treatment plan related to  self management and patient's adherence to plan as established by provider  Transitions of Care:  New goal.    (Reviewed 08/26/23) Doctor Visits  - discussed the importance of doctor visits Referral to Longitudinal Nurse Case Manager for Ongoing follow-up  Patient Goals/Self-Care Activities:   (Reviewed 08/26/23) Participate in Transition of Care Program/Attend [facility] scheduled calls Notify RN Care Manager of [facility] call rescheduling needs Take all medications as prescribed Attend all scheduled provider appointments Call pharmacy for medication refills 3-7 days in advance of running out of medications Call provider office for new concerns or questions   Follow Up Plan:  Outreach planned for Wednesday February 12th at 12noon         Patient verbalizes understanding of instructions and care plan provided today  and agrees to view in [place]. Active MyChart status and patient understanding of how to access instructions and care plan via MyChart confirmed with patient.     The patient has been provided with contact information for the care management team and has been advised to call with any health related questions or concerns.   Please call the care guide team at [phone] if you need to cancel or reschedule your appointment.   Please call the Suicide and Crisis Lifeline: 988 call the [place]  National Suicide Prevention Lifeline: 902-154-7388 or TTY: 607-103-2309 TTY 617-203-2836) to talk to a trained counselor if you are experiencing a Mental Health or Behavioral Health Crisis or need someone to talk to.  Medford Balboa, BSN, RN [place]  VBCI - [employer] [facility] RN Care Manager [phone]

## 2023-08-26 NOTE — Patient Outreach (Signed)
 Care Management  Transitions of Care Program Transitions of Care Post-discharge week 3   08/26/2023 Name: Jake Brown MRN: [id number] DOB: 18-Mar-1966  Subjective: Jake Brown is a 58 y.o. year old male who is a primary care patient of Gretel App, NP. The Care Management team Engaged with patient Engaged with patient by telephone to assess and address transitions of care needs.   Consent to Services:  Patient was given information about care management services, agreed to services, and gave verbal consent to participate.   Assessment: TOC Outreach completed to the patient today. He states he has not been sleeping well lately and has been awake since 4am. He has re-started his Wellbutrin  and wonders if this is why he doesn't sleep. Pharmacy was able to connect with the patient and review his meds and discussed compliance packaging. He declined the packaging and feels that he is able to take his own medications. BP has been around 138-146 systolic. He follows with [facility] and states he has an appointment in the next week or two. He has a CT of the brain 09/14/23. RNCM to follow up next week.    SDOH Interventions    Flowsheet Row Patient Outreach from 08/12/2023 in [place] [facility]PARTMENT Telephone from 08/03/2023 in [place] [facility] DEPARTMENT Office Visit from 06/10/2023 in [facility] Clinical Support from 08/28/2022 in [facility] [employer] Visit from 04/29/2022 in [facility] [employer] Visit from 10/03/2020 in [facility] at [employer]  SDOH Interventions        Food Insecurity Interventions Intervention Not Indicated Intervention Not Indicated -- Intervention Not Indicated -- --  Housing Interventions Intervention Not Indicated Intervention Not Indicated -- Intervention Not Indicated -- --   Transportation Interventions Intervention Not Indicated Intervention Not Indicated, Patient Resources (Friends/Family) -- Intervention Not Indicated -- --  Utilities Interventions Intervention Not Indicated Intervention Not Indicated -- Other (Comment)  [Community referral placed] -- --  Alcohol Usage Interventions -- -- -- Intervention Not Indicated (Score <7) -- --  Depression Interventions/Treatment  -- -- Counseling, Medication Currently on Treatment PHQ2-9 Score <4 Follow-up Not Indicated Medication, Counseling  Financial Strain Interventions -- -- -- Other (Comment)  [Disabled ,Social Work Referal placed] -- --  Physical Activity Interventions -- -- -- Intervention Not Indicated -- --  Stress Interventions -- -- -- Other (Comment)  [Pt interested in therapy. Community referral placed.] -- --  Social Connections Interventions Patient Declined -- -- Intervention Not Indicated -- --        Goals Addressed             This Visit's Progress    30 Day TOC Program       Current Barriers: (Reviewed 08/26/23) Knowledge Deficits related to plan of care for management of   Nontraumatic cerebellar hemorrhage   RNCM Clinical Goal(s):   (Reviewed 08/26/23) Patient will work with the Care Management team over the next 30 days to address Transition of Care Barriers: Medication access Medication Management Provider appointments take all medications exactly as prescribed and will call provider for medication related questions as evidenced by EMR attend all scheduled medical appointments: PCP and Specialist as evidenced by EMR  through collaboration with RN Care manager, provider, and care team.   Interventions:   (Reviewed 08/26/23) Evaluation of current treatment plan related to  self management and patient's adherence to plan as established by provider  Transitions of Care:  New goal.    (Reviewed 08/26/23) Doctor Visits  - discussed the importance of doctor visits Referral to Longitudinal Nurse  Case Manager for Ongoing follow-up  Patient Goals/Self-Care Activities:   (Reviewed 08/26/23) Participate in Transition of Care Program/Attend [facility] scheduled calls Notify RN Care Manager of [facility] call rescheduling needs Take all medications as prescribed Attend all scheduled provider appointments Call pharmacy for medication refills 3-7 days in advance of running out of medications Call provider office for new concerns or questions   Follow Up Plan:  Outreach planned for Wednesday February 12th at 12noon        Please refer to Care Plan for goals and interventions.  Patient educated on red flags signs/symptoms to watch for and was encouraged to report any of these identified, any new symptoms, changes in baseline or medication regimen, change in health status / well-being, or safety concerns to PCP and / or the VBCI Case Management team.   The patient has been provided with contact information for the care management team and has been advised to call with any health-related questions or concerns. The patient verbalized understanding with current POC. The patient is directed to their insurance card regarding availability of benefits coverage   Medford Balboa, BSN, RN [place]  VBCI - [facility] RN Care Manager [phone]

## 2023-08-31 ENCOUNTER — Ambulatory Visit: Payer: Medicare Other | Admitting: *Deleted

## 2023-08-31 VITALS — BP 138/86 | HR 61 | Ht 68.0 in | Wt 281.0 lb

## 2023-08-31 DIAGNOSIS — Z0000 Encounter for general adult medical examination without abnormal findings: Secondary | ICD-10-CM | POA: Diagnosis not present

## 2023-08-31 NOTE — Patient Instructions (Addendum)
 [name] , Thank you for taking time to come for your Medicare Wellness Visit. I appreciate your ongoing commitment to your health goals. Please review the following plan we discussed and let me know if I can assist you in the future.   Referrals/Orders/Follow-Ups/Clinician Recommendations: Remember to get your shingles vaccine and covid. Call and schedule your eye exam.  This is a list of the screening recommended for you and due dates:  Health Maintenance  Topic Date Due   COVID-19 Vaccine (7 - 2024-25 season) 03/22/2023   Yearly kidney health urinalysis for diabetes  04/30/2027*   Hemoglobin A1C  01/26/2024   Yearly kidney function blood test for diabetes  08/10/2024   Medicare Annual Wellness Visit  08/30/2024   DTaP/Tdap/Td vaccine (2 - Td or Tdap) 08/20/2028   Colon Cancer Screening  03/06/2032   Pneumococcal Vaccination  Completed   Flu Shot  Completed   Hepatitis C Screening  Completed   HIV Screening  Completed   HPV Vaccine  Aged Out   Complete foot exam   Discontinued   Eye exam for diabetics  Discontinued   Zoster (Shingles) Vaccine  Discontinued  *Topic was postponed. The date shown is not the original due date.    Advanced directives: (Provided) Advance directive discussed with you today. I have provided a copy for you to complete at home and have notarized. Once this is complete, please bring a copy in to our office so we can scan it into your chart.   Next Medicare Annual Wellness Visit scheduled for next year: Yes 09/05/24 @ 10:50  Managing Pain Without Opioids Opioids are strong medicines used to treat moderate to severe pain. For some people, especially those who have long-term (chronic) pain, opioids may not be the best choice for pain management due to: Side effects like nausea, constipation, and sleepiness. The risk of addiction (opioid use disorder). The longer you take opioids, the greater your risk of addiction. Pain that lasts for more than 3 months  is called chronic pain. Managing chronic pain usually requires more than one approach and is often provided by a team of health care providers working together (multidisciplinary approach). Pain management may be done at a pain management center or pain clinic. How to manage pain without the use of opioids Use non-opioid medicines Non-opioid medicines for pain may include: Over-the-counter or prescription non-steroidal anti-inflammatory drugs (NSAIDs). These may be the first medicines used for pain. They work well for muscle and bone pain, and they reduce swelling. Acetaminophen . This over-the-counter medicine may work well for milder pain but not swelling. Antidepressants. These may be used to treat chronic pain. A certain type of antidepressant (tricyclics) is often used. These medicines are given in lower doses for pain than when used for depression. Anticonvulsants. These are usually used to treat seizures but may also reduce nerve (neuropathic) pain. Muscle relaxants. These relieve pain caused by sudden muscle tightening (spasms). You may also use a pain medicine that is applied to the skin as a patch, cream, or gel (topical analgesic), such as a numbing medicine. These may cause fewer side effects than medicines taken by mouth. Do certain therapies as directed Some therapies can help with pain management. They include: Physical therapy. You will do exercises to gain strength and flexibility. A physical therapist may teach you exercises to move and stretch parts of your body that are weak, stiff, or painful. You can learn these exercises at physical therapy visits and practice them at home. Physical  therapy may also involve: Massage. Heat wraps or applying heat or cold to affected areas. Electrical signals that interrupt pain signals (transcutaneous electrical nerve stimulation, TENS). Weak lasers that reduce pain and swelling (low-level laser therapy). Signals from your body that help you  learn to regulate pain (biofeedback). Occupational therapy. This helps you to learn ways to function at home and work with less pain. Recreational therapy. This involves trying new activities or hobbies, such as a physical activity or drawing. Mental health therapy, including: Cognitive behavioral therapy (CBT). This helps you learn coping skills for dealing with pain. Acceptance and commitment therapy (ACT) to change the way you think and react to pain. Relaxation therapies, including muscle relaxation exercises and mindfulness-based stress reduction. Pain management counseling. This may be individual, family, or group counseling.  Receive medical treatments Medical treatments for pain management include: Nerve block injections. These may include a pain blocker and anti-inflammatory medicines. You may have injections: Near the spine to relieve chronic back or neck pain. Into joints to relieve back or joint pain. Into nerve areas that supply a painful area to relieve body pain. Into muscles (trigger point injections) to relieve some painful muscle conditions. A medical device placed near your spine to help block pain signals and relieve nerve pain or chronic back pain (spinal cord stimulation device). Acupuncture. Follow these instructions at home Medicines Take over-the-counter and prescription medicines only as told by your health care provider. If you are taking pain medicine, ask your health care providers about possible side effects to watch out for. Do not drive or use heavy machinery while taking prescription opioid pain medicine. Lifestyle  Do not use drugs or alcohol to reduce pain. If you drink alcohol, limit how much you have to: 0-1 drink a day for women who are not pregnant. 0-2 drinks a day for men. Know how much alcohol is in a drink. In the [place] one drink equals one 12 oz bottle of beer (355 mL), one 5 oz glass of wine (148 mL), or one 1 oz glass of hard liquor (44  mL). Do not use any products that contain nicotine or tobacco. These products include cigarettes, chewing tobacco, and vaping devices, such as e-cigarettes. If you need help quitting, ask your health care provider. Eat a healthy diet and maintain a healthy weight. Poor diet and excess weight may make pain worse. Eat foods that are high in fiber. These include fresh fruits and vegetables, whole grains, and beans. Limit foods that are high in fat and processed sugars, such as fried and sweet foods. Exercise regularly. Exercise lowers stress and may help relieve pain. Ask your health care provider what activities and exercises are safe for you. If your health care provider approves, join an exercise class that combines movement and stress reduction. Examples include yoga and tai chi. Get enough sleep. Lack of sleep may make pain worse. Lower stress as much as possible. Practice stress reduction techniques as told by your therapist. General instructions Work with all your pain management providers to find the treatments that work best for you. You are an important member of your pain management team. There are many things you can do to reduce pain on your own. Consider joining an online or in-person support group for people who have chronic pain. Keep all follow-up visits. This is important. Where to find more information You can find more information about managing pain without opioids from: American Academy of Pain Medicine: painmed.org Institute for Chronic Pain: instituteforchronicpain.org American  Chronic Pain Association: theacpa.org Contact a health care provider if: You have side effects from pain medicine. Your pain gets worse or does not get better with treatments or home therapy. You are struggling with anxiety or depression. Summary Many types of pain can be managed without opioids. Chronic pain may respond better to pain management without opioids. Pain is best managed when you and  a team of health care providers work together. Pain management without opioids may include non-opioid medicines, medical treatments, physical therapy, mental health therapy, and lifestyle changes. Tell your health care providers if your pain gets worse or is not being managed well enough. This information is not intended to replace advice given to you by your health care provider. Make sure you discuss any questions you have with your health care provider. Document Revised: 10/17/2020 Document Reviewed:  10/17/2020 Elsevier Patient Education  2024 [employer].
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
## 2023-09-02 ENCOUNTER — Other Ambulatory Visit: Payer: Self-pay

## 2023-09-02 NOTE — Patient Instructions (Signed)
Visit Information  Thank you for taking time to visit with me today. Please don't hesitate to contact me if I can be of assistance to you before our next scheduled telephone appointment.  Our next appointment is by telephone on February 21 at 12pm  Following is a copy of your care plan:   Goals Addressed             This Visit's Progress    30 Day TOC Program       Current Barriers: (Reviewed 09/02/23) Knowledge Deficits related to plan of care for management of   Nontraumatic cerebellar hemorrhage   RNCM Clinical Goal(s):   (Reviewed 09/02/23) Patient will work with the Care Management team over the next 30 days to address Transition of Care Barriers: Medication access Medication Management Provider appointments take all medications exactly as prescribed and will call provider for medication related questions as evidenced by EMR attend all scheduled medical appointments: PCP and Specialist as evidenced by EMR  through collaboration with RN Care manager, provider, and care team.   Interventions:   (Reviewed 09/02/23) Evaluation of current treatment plan related to  self management and patient's adherence to plan as established by provider  Transitions of Care:  New goal.    (Reviewed 09/02/23) Doctor Visits  - discussed the importance of doctor visits Referral to Longitudinal Nurse Case Manager for Ongoing follow-up  Patient Goals/Self-Care Activities:   (Reviewed 09/02/23) Participate in Transition of Care Program/Attend [facility] scheduled calls Notify RN Care Manager of [facility] call rescheduling needs Take all medications as prescribed Attend all scheduled provider appointments Call pharmacy for medication refills 3-7 days in advance of running out of medications Call provider office for new concerns or questions   Follow Up Plan:  Outreach planned for Wednesday February 21st at 12noon         Patient verbalizes understanding of instructions and care plan provided today and agrees  to view in MyChart. Active MyChart status and patient understanding of how to access instructions and care plan via MyChart confirmed with patient.     The patient has been provided with contact information for the care management team and has been advised to call with any health related questions or concerns.   Please call the care guide team at [phone] if you need to cancel or reschedule your appointment.   Please call the Suicide and Crisis Lifeline: 988 call the [place] National Suicide Prevention Lifeline: 579 336 8994 or TTY: 253-451-3959 TTY (831) 678-6501) to talk to a trained counselor if you are experiencing a Mental Health or Behavioral Health Crisis or need someone to talk to.  Deidre Ala, BSN, RN [place]  VBCI - [employer] [facility] RN Care Manager [phone]

## 2023-09-02 NOTE — Patient Outreach (Signed)
Care Management  Transitions of Care Program Transitions of Care Post-discharge week 4   09/02/2023 Name: Jake Brown MRN: [id number] DOB: 06-08-1966  Subjective: Jake Brown is a 58 y.o. year old male who is a primary care patient of Bethanie Dicker, NP. The Care Management team Engaged with patient Engaged with patient by telephone to assess and address transitions of care needs.   Consent to Services:  Patient was given information about care management services, agreed to services, and gave verbal consent to participate.   Assessment: TOC Outreach completed today. The patient states he didn't have long to talk because his dog, who has some medical issues, needed to go outside. He states he has been awake since 12AM and has trouble sleeping because he thinks about things. Recommended talking to his provider for something for anxiety that he can have to help him sleep. He reports compliance with taking BP and medications. Last two readings on BP are 134/86 and 130/82. The patient is pleased with his progress. Plan next week to contact patient after PCP appointment and then transfer to [facility] Longitudinal team.        SDOH Interventions    Flowsheet Row Clinical Support from 08/31/2023 in [facility] at [facility] Patient Outreach from 08/12/2023 in [place] HEALTH POPULATION HEALTH DEPARTMENT Telephone from 08/03/2023 in [place] POPULATION HEALTH DEPARTMENT Office Visit from 06/10/2023 in [facility] [place] [facility] at [employer] Clinical Support from 08/28/2022 in [facility] at [employer] Visit from 04/29/2022 in [facility] [place] [facility]Care at [employer]  SDOH Interventions        Food Insecurity Interventions Intervention Not Indicated Intervention Not Indicated Intervention Not Indicated -- Intervention Not Indicated --  Housing Interventions Intervention Not Indicated Intervention Not Indicated  Intervention Not Indicated -- Intervention Not Indicated --  Transportation Interventions Intervention Not Indicated Intervention Not Indicated Intervention Not Indicated, Patient Resources (Friends/Family) -- Intervention Not Indicated --  Utilities Interventions Intervention Not Indicated Intervention Not Indicated Intervention Not Indicated -- Other (Comment)  [Community referral placed] --  Alcohol Usage Interventions Intervention Not Indicated (Score <7) -- -- -- Intervention Not Indicated (Score <7) --  Depression Interventions/Treatment  -- -- -- Counseling, Medication Currently on Treatment PHQ2-9 Score <4 Follow-up Not Indicated  Financial Strain Interventions Intervention Not Indicated -- -- -- Other (Comment)  [Disabled ,Social Work Referal placed] --  Physical Activity Interventions Intervention Not Indicated -- -- -- Intervention Not Indicated --  Stress Interventions Intervention Not Indicated -- -- -- Other (Comment)  [Pt interested in therapy. Community referral placed.] --  Social Connections Interventions Intervention Not Indicated Patient Declined -- -- Intervention Not Indicated --  Health Literacy Interventions Intervention Not Indicated -- -- -- -- --        Goals Addressed             This Visit's Progress    30 Day TOC Program       Current Barriers: (Reviewed 09/02/23) Knowledge Deficits related to plan of care for management of   Nontraumatic cerebellar hemorrhage   RNCM Clinical Goal(s):   (Reviewed 09/02/23) Patient will work with the Care Management team over the next 30 days to address Transition of Care Barriers: Medication access Medication Management Provider appointments take all medications exactly as prescribed and will call provider for medication related questions as evidenced by EMR attend all scheduled medical appointments: PCP and Specialist as evidenced by EMR  through collaboration with RN Care manager,  provider, and care team.   Interventions:    (Reviewed 09/02/23) Evaluation of current treatment plan related to  self management and patient's adherence to plan as established by provider  Transitions of Care:  New goal.    (Reviewed 09/02/23) Doctor Visits  - discussed the importance of doctor visits Referral to Longitudinal Nurse Case Manager for Ongoing follow-up  Patient Goals/Self-Care Activities:   (Reviewed 09/02/23) Participate in Transition of Care Program/Attend [facility] scheduled calls Notify RN Care Manager of [facility] call rescheduling needs Take all medications as prescribed Attend all scheduled provider appointments Call pharmacy for medication refills 3-7 days in advance of running out of medications Call provider office for new concerns or questions   Follow Up Plan:  Outreach planned for Wednesday February 21st at 12noon         Plan: We have been unable to make contact with the patient for follow up. The care management team is available to follow up with the patient after provider conversation with the patient regarding recommendation for care management engagement and subsequent re-referral to the care management team.   Please refer to Care Plan for goals and interventions.  Patient educated on red flags signs/symptoms to watch for and was encouraged to report any of these identified, any new symptoms, changes in baseline or medication regimen, change in health status / well-being, or safety concerns to PCP and / or the [facility] Case Management team.   The patient has been provided with contact information for the care management team and has been advised to call with any health-related questions or concerns. The patient verbalized understanding with current POC. The patient is directed to their insurance card regarding availability of benefits coverage.  Deidre Ala, BSN, RN [place]  [facility] - [employer] [facility] RN Care Manager [phone]

## 2023-09-04 ENCOUNTER — Ambulatory Visit (HOSPITAL_COMMUNITY)
Admission: RE | Admit: 2023-09-04 | Discharge: 2023-09-04 | Disposition: A | Discharge status: Home / Self Care | Payer: Medicare Other | Source: Ambulatory Visit | Attending: Urology | Admitting: Urology

## 2023-09-04 DIAGNOSIS — M48062 Spinal stenosis, lumbar region with neurogenic claudication: Secondary | ICD-10-CM | POA: Diagnosis not present

## 2023-09-04 DIAGNOSIS — I619 Nontraumatic intracerebral hemorrhage, unspecified: Secondary | ICD-10-CM | POA: Diagnosis not present

## 2023-09-04 DIAGNOSIS — R319 Hematuria, unspecified: Secondary | ICD-10-CM | POA: Insufficient documentation

## 2023-09-04 DIAGNOSIS — M5412 Radiculopathy, cervical region: Secondary | ICD-10-CM | POA: Diagnosis not present

## 2023-09-04 DIAGNOSIS — Z905 Acquired absence of kidney: Secondary | ICD-10-CM | POA: Diagnosis not present

## 2023-09-04 DIAGNOSIS — I69354 Hemiplegia and hemiparesis following cerebral infarction affecting left non-dominant side: Secondary | ICD-10-CM | POA: Insufficient documentation

## 2023-09-04 DIAGNOSIS — I614 Nontraumatic intracerebral hemorrhage in cerebellum: Secondary | ICD-10-CM | POA: Diagnosis not present

## 2023-09-04 DIAGNOSIS — I6782 Cerebral ischemia: Secondary | ICD-10-CM | POA: Diagnosis not present

## 2023-09-04 DIAGNOSIS — M25561 Pain in right knee: Secondary | ICD-10-CM | POA: Diagnosis not present

## 2023-09-04 DIAGNOSIS — R935 Abnormal findings on diagnostic imaging of other abdominal regions, including retroperitoneum: Secondary | ICD-10-CM | POA: Diagnosis not present

## 2023-09-04 DIAGNOSIS — Z85528 Personal history of other malignant neoplasm of kidney: Secondary | ICD-10-CM | POA: Diagnosis not present

## 2023-09-04 DIAGNOSIS — G9389 Other specified disorders of brain: Secondary | ICD-10-CM | POA: Diagnosis not present

## 2023-09-04 MED ORDER — SODIUM CHLORIDE (PF) 0.9 % IJ SOLN
INTRAMUSCULAR | Status: AC
  Filled 2023-09-04: qty 200

## 2023-09-04 MED ORDER — IOHEXOL 300 MG/ML  SOLN
100.0000 mL | Freq: Once | INTRAMUSCULAR | Status: AC | PRN
  Administered 2023-09-04: 100 mL via INTRAVENOUS

## 2023-09-10 ENCOUNTER — Ambulatory Visit: Payer: Medicare Other | Admitting: Nurse Practitioner

## 2023-09-10 ENCOUNTER — Encounter: Payer: Self-pay | Admitting: Nurse Practitioner

## 2023-09-10 VITALS — BP 148/90 | HR 56 | Temp 97.6°F | Ht 68.0 in | Wt 284.2 lb

## 2023-09-10 DIAGNOSIS — N1831 Chronic kidney disease, stage 3a: Secondary | ICD-10-CM | POA: Diagnosis not present

## 2023-09-10 DIAGNOSIS — I10 Essential (primary) hypertension: Secondary | ICD-10-CM | POA: Diagnosis not present

## 2023-09-10 DIAGNOSIS — Z6841 Body Mass Index (BMI) 40.0 and over, adult: Secondary | ICD-10-CM

## 2023-09-10 MED ORDER — WEGOVY 0.5 MG/0.5ML ~~LOC~~ SOAJ: 0.5000 mg | SUBCUTANEOUS | 0 refills | Status: DC

## 2023-09-10 MED ORDER — WEGOVY 0.25 MG/0.5ML ~~LOC~~ SOAJ: 0.2500 mg | SUBCUTANEOUS | 0 refills | Status: DC

## 2023-09-10 NOTE — Progress Notes (Signed)
Jake Dicker, NP-C Phone: [phone]  Jake Brown is a 58 y.o. male who presents today for follow up.   Discussed the use of AI scribe software for clinical note transcription with the patient, who gave verbal consent to proceed.  History of Present Illness   Jake Brown is a 58 year old male with hypertension and a history of stroke who presents for follow-up.  He is here for a follow-up regarding his hypertension management. Blood pressure readings at home over the past two weeks have ranged from 124/83 to 163/114, with most readings between 124/80 and 146/80. No chest pain, shortness of breath, dizziness, or swelling. He is currently taking hydralazine 50 mg three times a day and Bystolic 20 mg once a day.  He reports a weight gain of approximately 10 pounds since December, following a stroke in early January. His current weight is 284 pounds, up from 272 pounds in November. He describes his diet as 'not good' and notes a sedentary lifestyle. He typically has a Location manager frozen biscuit and coffee for breakfast, often skips lunch, and enjoys cooking dinner, with a preference for hamburgers. He has reduced his intake of potatoes, except for occasional [place] fries, and does not consume much bread. He finds the smell of chicken, [place], and eggs off-putting at times.  He underwent back surgery in May of the previous year and reports ongoing back pain. He uses a cane for mobility and expresses uncertainty about the surgery's success, noting that the surgeon mentioned it could take a year to fully recover.      Social History   Tobacco Use  Smoking Status Former   Current packs/day: 0.00   Average packs/day: 1 pack/day for 20.0 years (20.0 ttl pk-yrs)   Types: Cigarettes   Start date: 07/21/1973   Quit date: 07/21/1993   Years since quitting: 30.1  Smokeless Tobacco Former    Current Outpatient Medications on File Prior to Visit  Medication Sig Dispense Refill    atorvastatin (LIPITOR) 40 MG tablet TAKE ONE TABLET BY MOUTH DAILY AT 6 P.M. 90 tablet 3   baclofen (LIORESAL) 10 MG tablet Take 10 mg by mouth 3 (three) times daily as needed for muscle spasms.     buPROPion (WELLBUTRIN XL) 150 MG 24 hr tablet Take 1 tablet (150 mg total) by mouth daily. 90 tablet 0   Cholecalciferol (VITAMIN D3) 125 MCG (5000 UT) CAPS Take 1 capsule (5,000 Units total) by mouth daily. 90 capsule 3   Cyanocobalamin (B-12) 5000 MCG CAPS Take 1 capsule by mouth daily. Patient takes in a gummy form.     fentaNYL (DURAGESIC) 12 MCG/HR Place 1 patch onto the skin every 3 (three) days. Patch was applied on 07/27/2023.     gabapentin (NEURONTIN) 300 MG capsule Take 1 capsule (300 mg total) by mouth 3 (three) times daily. 90 capsule 0   hydrALAZINE (APRESOLINE) 50 MG tablet Take 1 tablet (50 mg total) by mouth 3 (three) times daily. 90 tablet 5   Nebivolol HCl 20 MG TABS Take 1 tablet (20 mg total) by mouth daily. 30 tablet 5   oxyCODONE-acetaminophen (PERCOCET/ROXICET) 5-325 MG tablet Take 1-2 tablets by mouth every 4 (four) hours as needed for moderate pain. 30 tablet 0   No current facility-administered medications on file prior to visit.    ROS see history of present illness  Objective  Physical Exam Vitals:   09/10/23 1352 09/10/23 1403  BP: (!) 158/100 (!) 148/90  Pulse: (!) 56  Temp: 97.6 F (36.4 C)   SpO2: 96%     BP Readings from Last 3 Encounters:  09/10/23 (!) 148/90  08/31/23 138/86  08/11/23 138/84   Wt Readings from Last 3 Encounters:  09/10/23 284 lb 3.2 oz (128.9 kg)  08/31/23 281 lb (127.5 kg)  08/11/23 277 lb 12.8 oz (126 kg)    Physical Exam Constitutional:      General: He is not in acute distress.    Appearance: Normal appearance. He is obese.  HENT:     Head: Normocephalic.  Cardiovascular:     Rate and Rhythm: Normal rate and regular rhythm.     Heart sounds: Normal heart sounds.  Pulmonary:     Effort: Pulmonary effort is normal.      Breath sounds: Normal breath sounds.  Skin:    General: Skin is warm and dry.  Neurological:     General: No focal deficit present.     Mental Status: He is alert.  Psychiatric:        Mood and Affect: Mood normal.        Behavior: Behavior normal.     Assessment/Plan: Please see individual problem list.  Morbid obesity with BMI of 40.0-44.9, adult [facility]) Assessment & Plan: There has been at least a 10-pound weight gain since November. Diet is poor and activity level is low. Will start the patient on Wegovy. Counseled on the risk of pancreatitis and gallbladder disease. Discussed the risk of nausea. Advised to discontinue the [facility] and contact us immediately if they develop abdominal pain. If they develop excessive nausea they will contact us right away. I discussed that medullary thyroid cancer has been seen in rats studies. The patient confirmed no personal or family history of thyroid cancer, parathyroid cancer, or adrenal gland cancer. Discussed that we thus far have not seen medullary thyroid cancer result from use of this type of medication in humans. Advised to monitor the thyroid area and contact us for any lumps, swelling, trouble swallowing, or any other changes in this area. Discussed goal weight loss of 1 to 2 pounds a week while on this medication. Discussed the importance of healthy diet, exercise and lifestyle modifications even with this medication. Encourage a high protein, low carb diet and increased activity as tolerated. He will follow up in 6 weeks to assess tolerance and medication effectiveness.   Orders: -     Wegovy; Inject 0.25 mg into the skin once a week.  Dispense: 2 mL; Refill: 0 -     Wegovy; Inject 0.5 mg into the skin once a week.  Dispense: 2 mL; Refill: 0  Primary hypertension Assessment & Plan: Blood pressure readings at home range from 124/83 to 163/114, average 140s. Elevated reading in office x 2 today. Currently taking Hydralazine 50mg  TID and  Bystolic 20mg  daily. Continue these medications and monitoring blood pressure daily at home. Plan to discuss potential adjustments with nephrology at the upcoming appointment as he would benefit from additional medication with their supervision due to Hx of nephrectomy. Will continue to monitor.   Stage 3a chronic kidney disease [facility]) Assessment & Plan: Appointment with Nephrology scheduled for September 23, 2023.     Return in about 6 weeks (around 10/22/2023) for Follow up.   Jake Dicker, NP-C [place] [facility]

## 2023-09-10 NOTE — Assessment & Plan Note (Signed)
There has been at least a 10-pound weight gain since November. Diet is poor and activity level is low. Will start the patient on Wegovy. Counseled on the risk of pancreatitis and gallbladder disease. Discussed the risk of nausea. Advised to discontinue the [facility] and contact us immediately if they develop abdominal pain. If they develop excessive nausea they will contact us right away. I discussed that medullary thyroid cancer has been seen in rats studies. The patient confirmed no personal or family history of thyroid cancer, parathyroid cancer, or adrenal gland cancer. Discussed that we thus far have not seen medullary thyroid cancer result from use of this type of medication in humans. Advised to monitor the thyroid area and contact us for any lumps, swelling, trouble swallowing, or any other changes in this area. Discussed goal weight loss of 1 to 2 pounds a week while on this medication. Discussed the importance of healthy diet, exercise and lifestyle modifications even with this medication. Encourage a high protein, low carb diet and increased activity as tolerated. He will follow up in 6 weeks to assess tolerance and medication effectiveness.

## 2023-09-10 NOTE — Assessment & Plan Note (Addendum)
Blood pressure readings at home range from 124/83 to 163/114, average 140s. Elevated reading in office x 2 today. Currently taking Hydralazine 50mg  TID and Bystolic 20mg  daily. Continue these medications and monitoring blood pressure daily at home. Plan to discuss potential adjustments with nephrology at the upcoming appointment as he would benefit from additional medication with their supervision due to Hx of nephrectomy. Will continue to monitor.

## 2023-09-10 NOTE — Assessment & Plan Note (Signed)
Appointment with Nephrology scheduled for September 23, 2023.

## 2023-09-11 ENCOUNTER — Other Ambulatory Visit: Payer: Self-pay

## 2023-09-11 NOTE — Patient Outreach (Signed)
Care Management  Transitions of Care Program Transitions of Care Post-discharge week 5   09/11/2023 Name: Jake Brown MRN: [id number] DOB: 1966/05/01  Subjective: Jake Brown is a 58 y.o. year old male who is a primary care patient of Bethanie Dicker, NP. The Care Management team Engaged with patient Engaged with patient by telephone to assess and address transitions of care needs.   Consent to Services:  Patient was given information about care management services, agreed to services, and gave verbal consent to participate.   Assessment: TOC Outreach to the patient. He went to see his PCP yesterday for ongoing management of his Blood Pressure. His BP at the provider's office was 158/100 and then 148/90. No medication changes. He has been started on Wegovy to help with weight management. Discussed the benefits of losing weight and how it will help lower his BP and improve his back pain. Reviewed increasing exercise and reducing salt in his diet. Offered the [facility] Longitudinal Program which he declined.    SDOH Interventions    Flowsheet Row Clinical Support from 08/31/2023 in [facility] at [facility] Patient Outreach from 08/12/2023 in [place] HEALTH POPULATION HEALTH DEPARTMENT Telephone from 08/03/2023 in [place] POPULATION HEALTH DEPARTMENT Office Visit from 06/10/2023 in [facility] at [facility] Clinical Support from 08/28/2022 in [facility] [place] [facility] at [employer] Visit from 04/29/2022 in [facility] [place] [facility]Care at [employer]  SDOH Interventions        Food Insecurity Interventions Intervention Not Indicated Intervention Not Indicated Intervention Not Indicated -- Intervention Not Indicated --  Housing Interventions Intervention Not Indicated Intervention Not Indicated Intervention Not Indicated -- Intervention Not Indicated --  Transportation Interventions Intervention Not Indicated  Intervention Not Indicated Intervention Not Indicated, Patient Resources (Friends/Family) -- Intervention Not Indicated --  Utilities Interventions Intervention Not Indicated Intervention Not Indicated Intervention Not Indicated -- Other (Comment)  [Community referral placed] --  Alcohol Usage Interventions Intervention Not Indicated (Score <7) -- -- -- Intervention Not Indicated (Score <7) --  Depression Interventions/Treatment  -- -- -- Counseling, Medication Currently on Treatment PHQ2-9 Score <4 Follow-up Not Indicated  Financial Strain Interventions Intervention Not Indicated -- -- -- Other (Comment)  [Disabled ,Social Work Referal placed] --  Physical Activity Interventions Intervention Not Indicated -- -- -- Intervention Not Indicated --  Stress Interventions Intervention Not Indicated -- -- -- Other (Comment)  [Pt interested in therapy. Community referral placed.] --  Social Connections Interventions Intervention Not Indicated Patient Declined -- -- Intervention Not Indicated --  Health Literacy Interventions Intervention Not Indicated -- -- -- -- --        Goals Addressed             This Visit's Progress    COMPLETED: 30 Day TOC Program   On track    Current Barriers: (Reviewed 09/11/23) Knowledge Deficits related to plan of care for management of   Nontraumatic cerebellar hemorrhage   RNCM Clinical Goal(s):   (Reviewed 09/11/23) Patient will work with the Care Management team over the next 30 days to address Transition of Care Barriers: Medication access Medication Management Provider appointments take all medications exactly as prescribed and will call provider for medication related questions as evidenced by EMR  through collaboration with RN Care manager, provider, and care team.   Interventions:   (Reviewed 09/11/23) Evaluation of current treatment plan related to  self management and patient's adherence to plan as established by provider  Transitions of  Care:  Goal Met.     (Reviewed 09/11/23) Doctor Visits  - discussed the importance of doctor visits Referral to Longitudinal Nurse Case Manager for Ongoing follow-up  Patient Goals/Self-Care Activities:   (Reviewed 09/11/23) Participate in Transition of Care Program/Attend [facility] scheduled calls Notify RN Care Manager of TOC call rescheduling needs Take all medications as prescribed Attend all scheduled provider appointments Call pharmacy for medication refills 3-7 days in advance of running out of medications Call provider office for new concerns or questions   Follow Up Plan: Goals completed. Discharge from [facility] program         Plan: The patient has been provided with contact information for the care management team and has been advised to call with any health related questions or concerns.   Deidre Ala, BSN, RN [place]  VBCI - [employer] [facility] RN Care Manager [phone]

## 2023-09-11 NOTE — Patient Instructions (Signed)
Visit Information  Thank you for taking time to visit with me today. Please don't hesitate to contact me if I can be of assistance to you before our next scheduled telephone appointment.   Following is a copy of your care plan:   Goals Addressed             This Visit's Progress    COMPLETED: 30 Day TOC Program   On track    Current Barriers: (Reviewed 09/11/23) Knowledge Deficits related to plan of care for management of   Nontraumatic cerebellar hemorrhage   RNCM Clinical Goal(s):   (Reviewed 09/11/23) Patient will work with the Care Management team over the next 30 days to address Transition of Care Barriers: Medication access Medication Management Provider appointments take all medications exactly as prescribed and will call provider for medication related questions as evidenced by EMR  through collaboration with RN Care manager, provider, and care team.   Interventions:   (Reviewed 09/11/23) Evaluation of current treatment plan related to  self management and patient's adherence to plan as established by provider  Transitions of Care:  Goal Met.    (Reviewed 09/11/23) Doctor Visits  - discussed the importance of doctor visits Referral to Longitudinal Nurse Case Manager for Ongoing follow-up  Patient Goals/Self-Care Activities:   (Reviewed 09/11/23) Participate in Transition of Care Program/Attend [facility] scheduled calls Notify RN Care Manager of TOC call rescheduling needs Take all medications as prescribed Attend all scheduled provider appointments Call pharmacy for medication refills 3-7 days in advance of running out of medications Call provider office for new concerns or questions   Follow Up Plan: Goals completed. Discharge from [facility] program         Patient verbalizes understanding of instructions and care plan provided today and agrees to view in MyChart. Active MyChart status and patient understanding of how to access instructions and care plan via MyChart confirmed  with patient.     The patient has been provided with contact information for the care management team and has been advised to call with any health related questions or concerns.   Please call the care guide team at [phone] if you need to cancel or reschedule your appointment.   Please call the Suicide and Crisis Lifeline: 988 call the [place] National Suicide Prevention Lifeline: (214) 122-6215 or TTY: 248-541-4575 TTY (250)312-9058) to talk to a trained counselor if you are experiencing a Mental Health or Behavioral Health Crisis or need someone to talk to.  Deidre Ala, BSN, RN [place]  VBCI - [employer] [facility] RN Care Manager [phone]

## 2023-09-15 ENCOUNTER — Other Ambulatory Visit (HOSPITAL_COMMUNITY): Payer: Self-pay

## 2023-09-15 ENCOUNTER — Telehealth: Payer: Self-pay

## 2023-09-15 NOTE — Telephone Encounter (Signed)
 Pharmacy Patient Advocate Encounter  Received notification from [facility] that Prior Authorization for Wegovy 0.25mg /0.40ml has been APPROVED from 09/15/23 to 09/14/24

## 2023-09-15 NOTE — Telephone Encounter (Signed)
 Pharmacy Patient Advocate Encounter   Received notification from  [facility] Portal that prior authorization for [facility] 0.25MG /0.5ML auto-injectors is required/requested.   Insurance verification completed.   The patient is insured through [facility] .   Per test claim: PA required; PA submitted to above mentioned insurance via CoverMyMeds Key/confirmation #/EOC [id number] Status is pending

## 2023-09-15 NOTE — Telephone Encounter (Signed)
 Copied from CRM [id number]. Topic: Clinical - Medical Advice >> Sep 15, 2023  9:07 AM Almira Coaster wrote: Reason for CRM: [employer] is calling with an approval for Semaglutide-Weight Management [facility]) 0.25 MG/0.5ML SOAJ , Auth is good starting today 09/15/2023 - 09/14/2024. Authorization form will be faxed to the office. Best call back number is [phone] option 5.

## 2023-09-23 DIAGNOSIS — I509 Heart failure, unspecified: Secondary | ICD-10-CM | POA: Diagnosis not present

## 2023-09-23 DIAGNOSIS — N1831 Chronic kidney disease, stage 3a: Secondary | ICD-10-CM | POA: Diagnosis not present

## 2023-09-23 DIAGNOSIS — I10 Essential (primary) hypertension: Secondary | ICD-10-CM | POA: Diagnosis not present

## 2023-09-23 DIAGNOSIS — R8290 Unspecified abnormal findings in urine: Secondary | ICD-10-CM | POA: Diagnosis not present

## 2023-09-23 DIAGNOSIS — Z1159 Encounter for screening for other viral diseases: Secondary | ICD-10-CM | POA: Diagnosis not present

## 2023-09-24 ENCOUNTER — Other Ambulatory Visit (HOSPITAL_COMMUNITY): Payer: Self-pay

## 2023-09-24 DIAGNOSIS — I619 Nontraumatic intracerebral hemorrhage, unspecified: Secondary | ICD-10-CM | POA: Diagnosis not present

## 2023-09-24 DIAGNOSIS — I69354 Hemiplegia and hemiparesis following cerebral infarction affecting left non-dominant side: Secondary | ICD-10-CM | POA: Diagnosis not present

## 2023-09-24 DIAGNOSIS — G4733 Obstructive sleep apnea (adult) (pediatric): Secondary | ICD-10-CM | POA: Diagnosis not present

## 2023-09-24 DIAGNOSIS — I10 Essential (primary) hypertension: Secondary | ICD-10-CM | POA: Diagnosis not present

## 2023-09-24 NOTE — Telephone Encounter (Signed)
 Pharmacy Patient Advocate Encounter  Received notification from [facility] that Prior Authorization for [facility] 0.25MG /0.5ML auto-injectors has been APPROVED   Key: [facility] Personalized support and financial assistance may be available through the [employer] program.

## 2023-09-25 DIAGNOSIS — I341 Nonrheumatic mitral (valve) prolapse: Secondary | ICD-10-CM | POA: Diagnosis not present

## 2023-09-25 DIAGNOSIS — E782 Mixed hyperlipidemia: Secondary | ICD-10-CM | POA: Diagnosis not present

## 2023-09-25 DIAGNOSIS — G4733 Obstructive sleep apnea (adult) (pediatric): Secondary | ICD-10-CM | POA: Diagnosis not present

## 2023-09-25 DIAGNOSIS — I10 Essential (primary) hypertension: Secondary | ICD-10-CM | POA: Diagnosis not present

## 2023-10-05 DIAGNOSIS — R3915 Urgency of urination: Secondary | ICD-10-CM | POA: Diagnosis not present

## 2023-10-05 DIAGNOSIS — R310 Gross hematuria: Secondary | ICD-10-CM | POA: Diagnosis not present

## 2023-10-05 DIAGNOSIS — N401 Enlarged prostate with lower urinary tract symptoms: Secondary | ICD-10-CM | POA: Diagnosis not present

## 2023-10-05 DIAGNOSIS — R350 Frequency of micturition: Secondary | ICD-10-CM | POA: Diagnosis not present

## 2023-10-05 DIAGNOSIS — Z85528 Personal history of other malignant neoplasm of kidney: Secondary | ICD-10-CM | POA: Diagnosis not present

## 2023-10-09 ENCOUNTER — Ambulatory Visit: Payer: Medicare Other | Admitting: Cardiology

## 2023-10-13 DIAGNOSIS — M47816 Spondylosis without myelopathy or radiculopathy, lumbar region: Secondary | ICD-10-CM | POA: Diagnosis not present

## 2023-10-13 DIAGNOSIS — M542 Cervicalgia: Secondary | ICD-10-CM | POA: Diagnosis not present

## 2023-10-13 DIAGNOSIS — M4317 Spondylolisthesis, lumbosacral region: Secondary | ICD-10-CM | POA: Diagnosis not present

## 2023-10-22 DIAGNOSIS — I509 Heart failure, unspecified: Secondary | ICD-10-CM | POA: Diagnosis not present

## 2023-10-22 DIAGNOSIS — E785 Hyperlipidemia, unspecified: Secondary | ICD-10-CM | POA: Diagnosis not present

## 2023-10-22 DIAGNOSIS — I10 Essential (primary) hypertension: Secondary | ICD-10-CM | POA: Diagnosis not present

## 2023-10-22 DIAGNOSIS — N1831 Chronic kidney disease, stage 3a: Secondary | ICD-10-CM | POA: Diagnosis not present

## 2023-11-02 DIAGNOSIS — M5412 Radiculopathy, cervical region: Secondary | ICD-10-CM | POA: Diagnosis not present

## 2023-11-02 DIAGNOSIS — M48062 Spinal stenosis, lumbar region with neurogenic claudication: Secondary | ICD-10-CM | POA: Diagnosis not present

## 2023-11-02 DIAGNOSIS — M25561 Pain in right knee: Secondary | ICD-10-CM | POA: Diagnosis not present

## 2023-11-04 DIAGNOSIS — Z79899 Other long term (current) drug therapy: Secondary | ICD-10-CM | POA: Diagnosis not present

## 2023-11-04 DIAGNOSIS — G894 Chronic pain syndrome: Secondary | ICD-10-CM | POA: Diagnosis not present

## 2023-11-17 ENCOUNTER — Other Ambulatory Visit: Payer: Self-pay | Admitting: Nurse Practitioner

## 2023-11-17 DIAGNOSIS — F339 Major depressive disorder, recurrent, unspecified: Secondary | ICD-10-CM

## 2023-11-23 DIAGNOSIS — I509 Heart failure, unspecified: Secondary | ICD-10-CM | POA: Diagnosis not present

## 2023-11-23 DIAGNOSIS — I10 Essential (primary) hypertension: Secondary | ICD-10-CM | POA: Diagnosis not present

## 2023-11-23 DIAGNOSIS — N1831 Chronic kidney disease, stage 3a: Secondary | ICD-10-CM | POA: Diagnosis not present

## 2023-11-23 DIAGNOSIS — E785 Hyperlipidemia, unspecified: Secondary | ICD-10-CM | POA: Diagnosis not present

## 2023-11-24 ENCOUNTER — Encounter: Payer: Self-pay | Admitting: Nurse Practitioner

## 2023-11-24 ENCOUNTER — Ambulatory Visit: Admitting: Nurse Practitioner

## 2023-11-24 VITALS — BP 140/92 | HR 60 | Temp 98.3°F | Ht 68.0 in | Wt 279.6 lb

## 2023-11-24 DIAGNOSIS — I10 Essential (primary) hypertension: Secondary | ICD-10-CM

## 2023-11-24 DIAGNOSIS — F339 Major depressive disorder, recurrent, unspecified: Secondary | ICD-10-CM

## 2023-11-24 DIAGNOSIS — Z6841 Body Mass Index (BMI) 40.0 and over, adult: Secondary | ICD-10-CM

## 2023-11-24 NOTE — Progress Notes (Signed)
 Bluford Burkitt, NP-C Phone: [phone]  Jake Brown is a 58 y.o. male who presents today for follow up.   Discussed the use of AI scribe software for clinical note transcription with the patient, who gave verbal consent to proceed.  History of Present Illness   Jake Brown is a 58 year old male with hypertension and weight management issues who presents for follow-up on his weight loss and blood pressure management.  He has not started the weight loss medication, Wegovy , due to its high cost. Despite this, he has lost a few pounds but acknowledges that his diet is not optimal. He is attempting to reduce processed foods but finds it challenging due to his reliance on frozen foods. He plans to increase his physical activity by going to the pool four times a week once it opens and currently walks his dog.  He is currently taking Wellbutrin  at a dose of 150 mg and has been more consistent with his medication intake. He feels that if he stopped taking it, he would notice a difference, but he does not feel that it has significantly changed his mood. He has decided to continue with the current dose for a little longer.  He has been seen by nephrology several times and is on five blood pressure medications: amlodipine , hydralazine , losartan , Bystolic , and spironolactone. His blood pressure was recorded as 118/79 yesterday at nephrology and 138/74 today. He attributes some stress to remodeling work at his home, which has temporarily disrupted his living space.  He has also seen a urologist and is taking tamsulosin  (Flomax ), which has helped with urinary issues. He no longer has problems starting and stopping urination, but now experiences urgency when he needs to go.  No chest pain, shortness of breath, or dizziness.      Social History   Tobacco Use  Smoking Status Former   Current packs/day: 0.00   Average packs/day: 1 pack/day for 20.0 years (20.0 ttl pk-yrs)   Types: Cigarettes    Start date: 07/21/1973   Quit date: 07/21/1993   Years since quitting: 30.3  Smokeless Tobacco Former    Current Outpatient Medications on File Prior to Visit  Medication Sig Dispense Refill   amLODipine  (NORVASC ) 5 MG tablet Take 5 mg by mouth daily.     atorvastatin  (LIPITOR ) 40 MG tablet TAKE ONE TABLET BY MOUTH DAILY AT 6 P.M. 90 tablet 3   baclofen  (LIORESAL ) 10 MG tablet Take 10 mg by mouth 3 (three) times daily as needed for muscle spasms.     buPROPion  (WELLBUTRIN  XL) 150 MG 24 hr tablet TAKE 1 TABLET BY MOUTH DAILY 90 tablet 1   Cholecalciferol  (VITAMIN D3) 125 MCG (5000 UT) CAPS Take 1 capsule (5,000 Units total) by mouth daily. 90 capsule 3   clopidogrel  (PLAVIX ) 75 MG tablet Take 75 mg by mouth daily.     Cyanocobalamin  (B-12) 5000 MCG CAPS Take 1 capsule by mouth daily. Patient takes in a gummy form.     fentaNYL  (DURAGESIC ) 12 MCG/HR Place 1 patch onto the skin every 3 (three) days. Patch was applied on 07/27/2023.     gabapentin  (NEURONTIN ) 300 MG capsule Take 1 capsule (300 mg total) by mouth 3 (three) times daily. 90 capsule 0   hydrALAZINE  (APRESOLINE ) 50 MG tablet Take 1 tablet (50 mg total) by mouth 3 (three) times daily. 90 tablet 5   losartan  (COZAAR ) 25 MG tablet Take 25 mg by mouth daily.     Nebivolol  HCl 20 MG  TABS Take 1 tablet (20 mg total) by mouth daily. 30 tablet 5   oxyCODONE -acetaminophen  (PERCOCET/ROXICET) 5-325 MG tablet Take 1-2 tablets by mouth every 4 (four) hours as needed for moderate pain. 30 tablet 0   tamsulosin  (FLOMAX ) 0.4 MG CAPS capsule Take 0.4 mg by mouth daily.     spironolactone (ALDACTONE) 25 MG tablet Take 25 mg by mouth daily.     No current facility-administered medications on file prior to visit.     ROS see history of present illness  Objective  Physical Exam Vitals:   11/24/23 1100 11/24/23 1123  BP: 138/74 (!) 140/92  Pulse: 60   Temp: 98.3 F (36.8 C)   SpO2: 97%     BP Readings from Last 3 Encounters:  11/24/23 (!)  140/92  09/10/23 (!) 148/90  08/31/23 138/86   Wt Readings from Last 3 Encounters:  11/24/23 279 lb 9.6 oz (126.8 kg)  09/10/23 284 lb 3.2 oz (128.9 kg)  08/31/23 281 lb (127.5 kg)    Physical Exam Constitutional:      General: He is not in acute distress.    Appearance: Normal appearance. He is obese.  HENT:     Head: Normocephalic.  Cardiovascular:     Rate and Rhythm: Normal rate and regular rhythm.     Heart sounds: Normal heart sounds.  Pulmonary:     Effort: Pulmonary effort is normal.     Breath sounds: Normal breath sounds.  Skin:    General: Skin is warm and dry.  Neurological:     General: No focal deficit present.     Mental Status: He is alert.  Psychiatric:        Mood and Affect: Mood normal.        Behavior: Behavior normal.      Assessment/Plan: Please see individual problem list.  Morbid obesity with BMI of 40.0-44.9, adult [facility]) Assessment & Plan: He is actively working on weight loss, which is important for his overall health. Despite challenges, he has lost a few pounds. Discussed dietary changes and increasing physical activity. Encourage activities like swimming and walking, and advise reducing processed foods while increasing fresh vegetables and proteins.    Primary hypertension Assessment & Plan: His hypertension is well-controlled with five medications, though there is a slight systolic increase likely due to stress. Coordination with nephrology remains effective. Recheck blood pressure during the visit and continue the current antihypertensive regimen: amlodipine , hydralazine , losartan , Bystolic , and spironolactone.   Depression, recurrent (HCC) Assessment & Plan: His depression is managed with Wellbutrin  150 mg. His mood is stable but could improve, though he prefers the current dose. Continue Wellbutrin  at 150 mg and monitor mood, considering dose adjustment if needed.     Return in about 3 months (around 02/24/2024) for Follow  up.   Bluford Burkitt, NP-C [facility] - [facility]

## 2023-11-24 NOTE — Assessment & Plan Note (Signed)
 His hypertension is well-controlled with five medications, though there is a slight systolic increase likely due to stress. Coordination with nephrology remains effective. Recheck blood pressure during the visit and continue the current antihypertensive regimen: amlodipine , hydralazine , losartan , Bystolic , and spironolactone.

## 2023-11-24 NOTE — Assessment & Plan Note (Signed)
 His depression is managed with Wellbutrin  150 mg. His mood is stable but could improve, though he prefers the current dose. Continue Wellbutrin  at 150 mg and monitor mood, considering dose adjustment if needed.

## 2023-11-24 NOTE — Assessment & Plan Note (Signed)
 He is actively working on weight loss, which is important for his overall health. Despite challenges, he has lost a few pounds. Discussed dietary changes and increasing physical activity. Encourage activities like swimming and walking, and advise reducing processed foods while increasing fresh vegetables and proteins.

## 2023-12-22 ENCOUNTER — Encounter: Payer: Self-pay | Admitting: Pharmacist

## 2023-12-22 NOTE — Progress Notes (Signed)
 Pharmacy Quality Measure Review  This patient is appearing on a report for being at risk of failing the adherence measure for hypertension (ACEi/ARB) medications this calendar year.   Medication: LOSARTAN  25 MG TABLET Last fill date: 10/22/2023 for 30 day supply  Insurance report was not up to date. No action needed at this time.  Prescription has been refilled as of 12/03/23 x30 day supply.

## 2023-12-28 DIAGNOSIS — M25561 Pain in right knee: Secondary | ICD-10-CM | POA: Diagnosis not present

## 2023-12-28 DIAGNOSIS — G2581 Restless legs syndrome: Secondary | ICD-10-CM | POA: Diagnosis not present

## 2023-12-28 DIAGNOSIS — M5412 Radiculopathy, cervical region: Secondary | ICD-10-CM | POA: Diagnosis not present

## 2023-12-28 DIAGNOSIS — M62831 Muscle spasm of calf: Secondary | ICD-10-CM | POA: Diagnosis not present

## 2023-12-28 DIAGNOSIS — G894 Chronic pain syndrome: Secondary | ICD-10-CM | POA: Diagnosis not present

## 2023-12-28 DIAGNOSIS — M4726 Other spondylosis with radiculopathy, lumbar region: Secondary | ICD-10-CM | POA: Diagnosis not present

## 2023-12-28 DIAGNOSIS — M48062 Spinal stenosis, lumbar region with neurogenic claudication: Secondary | ICD-10-CM | POA: Diagnosis not present

## 2023-12-30 DIAGNOSIS — B351 Tinea unguium: Secondary | ICD-10-CM | POA: Diagnosis not present

## 2023-12-30 DIAGNOSIS — M79675 Pain in left toe(s): Secondary | ICD-10-CM | POA: Diagnosis not present

## 2023-12-30 DIAGNOSIS — M79674 Pain in right toe(s): Secondary | ICD-10-CM | POA: Diagnosis not present

## 2024-01-15 DIAGNOSIS — M542 Cervicalgia: Secondary | ICD-10-CM | POA: Diagnosis not present

## 2024-01-15 DIAGNOSIS — M4317 Spondylolisthesis, lumbosacral region: Secondary | ICD-10-CM | POA: Diagnosis not present

## 2024-01-15 DIAGNOSIS — M47816 Spondylosis without myelopathy or radiculopathy, lumbar region: Secondary | ICD-10-CM | POA: Diagnosis not present

## 2024-01-21 ENCOUNTER — Other Ambulatory Visit: Payer: Self-pay | Admitting: Neurosurgery

## 2024-01-21 DIAGNOSIS — M542 Cervicalgia: Secondary | ICD-10-CM

## 2024-01-21 DIAGNOSIS — M4317 Spondylolisthesis, lumbosacral region: Secondary | ICD-10-CM

## 2024-02-01 DIAGNOSIS — M5412 Radiculopathy, cervical region: Secondary | ICD-10-CM | POA: Diagnosis not present

## 2024-02-01 DIAGNOSIS — M25561 Pain in right knee: Secondary | ICD-10-CM | POA: Diagnosis not present

## 2024-02-01 DIAGNOSIS — M4726 Other spondylosis with radiculopathy, lumbar region: Secondary | ICD-10-CM | POA: Diagnosis not present

## 2024-02-01 DIAGNOSIS — M48062 Spinal stenosis, lumbar region with neurogenic claudication: Secondary | ICD-10-CM | POA: Diagnosis not present

## 2024-02-03 ENCOUNTER — Inpatient Hospital Stay: Admission: RE | Admit: 2024-02-03 | Source: Ambulatory Visit

## 2024-02-03 ENCOUNTER — Other Ambulatory Visit

## 2024-02-10 ENCOUNTER — Other Ambulatory Visit

## 2024-02-15 ENCOUNTER — Other Ambulatory Visit: Payer: Self-pay | Admitting: Nurse Practitioner

## 2024-02-15 MED ORDER — LOSARTAN POTASSIUM 25 MG PO TABS
25.0000 mg | ORAL_TABLET | Freq: Every day | ORAL | 0 refills | Status: DC
Start: 2024-02-15 — End: 2024-03-08

## 2024-02-15 NOTE — Telephone Encounter (Signed)
 Copied from CRM [id number]. Topic: Clinical - Medication Refill >> Feb 15, 2024  1:09 PM Suzen Jake wrote: Medication: losartan  (COZAAR ) 25 MG tablet-90 day supply   Has the patient contacted their pharmacy? Yes   This is the patient's preferred pharmacy:  [facility] [id number] [place], [place] - [address] [name] Phone: [phone] Fax: [phone]   Is this the correct pharmacy for this prescription? Yes If no, delete pharmacy and type the correct one.   Has the prescription been filled recently? No  Is the patient out of the medication? No  Has the patient been seen for an appointment in the last year OR does the patient have an upcoming appointment? Yes  Can we respond through MyChart? Yes  Agent: Please be advised that Rx refills may take up to 3 business days. We ask that you follow-up with your pharmacy.

## 2024-02-16 NOTE — Discharge Instructions (Signed)

## 2024-02-17 ENCOUNTER — Inpatient Hospital Stay
Admission: RE | Admit: 2024-02-17 | Discharge: 2024-02-17 | Disposition: A | Discharge status: Home / Self Care | Source: Ambulatory Visit | Attending: Neurosurgery | Admitting: Neurosurgery

## 2024-02-17 ENCOUNTER — Other Ambulatory Visit

## 2024-02-22 NOTE — Telephone Encounter (Unsigned)
 Copied from CRM [id number]. Topic: Clinical - Prescription Issue >> Feb 22, 2024  3:11 PM Henretta I wrote: Reason for CRM: Patients prescription for losartan  (COZAAR ) 25 MG tablet was sent in for 30 day supply but patient is wanting to do a 90 day supply. Can prescription be resent in for 90 instead of 30 to  [facility] [id number] [place], [place] - [address] [place] [address] [place] [address] Phone: [phone] Fax: [phone] Hours: Not open 24 hours

## 2024-02-24 ENCOUNTER — Ambulatory Visit: Admitting: Nurse Practitioner

## 2024-02-24 VITALS — BP 152/100 | HR 64 | Temp 98.0°F | Ht 68.0 in | Wt 277.6 lb

## 2024-02-24 DIAGNOSIS — I10 Essential (primary) hypertension: Secondary | ICD-10-CM

## 2024-02-24 DIAGNOSIS — N1831 Chronic kidney disease, stage 3a: Secondary | ICD-10-CM

## 2024-02-24 DIAGNOSIS — F339 Major depressive disorder, recurrent, unspecified: Secondary | ICD-10-CM | POA: Diagnosis not present

## 2024-02-24 DIAGNOSIS — M545 Low back pain, unspecified: Secondary | ICD-10-CM

## 2024-02-24 DIAGNOSIS — G8929 Other chronic pain: Secondary | ICD-10-CM

## 2024-02-24 DIAGNOSIS — J309 Allergic rhinitis, unspecified: Secondary | ICD-10-CM

## 2024-02-24 MED ORDER — CETIRIZINE HCL 10 MG PO TABS
10.0000 mg | ORAL_TABLET | Freq: Every day | ORAL | 11 refills | Status: DC
Start: 2024-02-24 — End: 2024-03-18

## 2024-02-24 NOTE — Progress Notes (Unsigned)
 Jake Glance, NP-C Phone: [phone]  Jake Brown is a 58 y.o. male who presents today for follow up.   Discussed the use of AI scribe software for clinical note transcription with the patient, who gave verbal consent to proceed.  History of Present Illness   Jake Brown is a 58 year old male with hypertension who presents with a runny nose and elevated blood pressure.  He has a persistent runny nose. Imaging in January showed fluid in his sinuses, although he was asymptomatic at the time. He previously used a nasal spray, possibly Flonase , but discontinued it due to increased blood pressure. He occasionally takes Benadryl  but does not use any daily antihistamines.  His blood pressure is often elevated during medical visits but is typically around 140/80 mmHg when checked at home. He is currently on amlodipine , hydralazine , losartan , Bystolic , and spironolactone. No chest pain, shortness of breath, dizziness, or swelling.  He describes ongoing back pain. He consulted with a neurosurgeon a month ago who recommended a CT scan with contrast, which he canceled. He is currently under pain management and has discussed adjusting his pain medication regimen, specifically increasing fentanyl  and decreasing Percocet.  His social history includes significant stress related to his parents' health. His father requires weekly trips to the [facility] hospital, and his mother, who lives in [place], is experiencing worsening dementia and Alzheimer's, necessitating consideration of in-home care or placement in a care facility. He and his sister are actively involved in her care.  He is currently on Wellbutrin  for mood management and reports his mood, anxiety, and depression are stable. He has not changed his diet or exercise routine recently and is uncertain about any weight changes.      Social History   Tobacco Use  Smoking Status Former   Current packs/day: 0.00   Average packs/day: 1  pack/day for 20.0 years (20.0 ttl pk-yrs)   Types: Cigarettes   Start date: 07/21/1973   Quit date: 07/21/1993   Years since quitting: 30.6  Smokeless Tobacco Former    Current Outpatient Medications on File Prior to Visit  Medication Sig Dispense Refill   amLODipine  (NORVASC ) 5 MG tablet Take 5 mg by mouth daily.     atorvastatin  (LIPITOR ) 40 MG tablet TAKE ONE TABLET BY MOUTH DAILY AT 6 P.M. 90 tablet 3   baclofen  (LIORESAL ) 10 MG tablet Take 10 mg by mouth 3 (three) times daily as needed for muscle spasms.     buPROPion  (WELLBUTRIN  XL) 150 MG 24 hr tablet TAKE 1 TABLET BY MOUTH DAILY 90 tablet 1   Cholecalciferol  (VITAMIN D3) 125 MCG (5000 UT) CAPS Take 1 capsule (5,000 Units total) by mouth daily. 90 capsule 3   clopidogrel  (PLAVIX ) 75 MG tablet Take 75 mg by mouth daily.     Cyanocobalamin  (B-12) 5000 MCG CAPS Take 1 capsule by mouth daily. Patient takes in a gummy form.     fentaNYL  (DURAGESIC ) 12 MCG/HR Place 1 patch onto the skin every 3 (three) days. Patch was applied on 07/27/2023.     gabapentin  (NEURONTIN ) 300 MG capsule Take 1 capsule (300 mg total) by mouth 3 (three) times daily. 90 capsule 0   hydrALAZINE  (APRESOLINE ) 50 MG tablet Take 1 tablet (50 mg total) by mouth 3 (three) times daily. 90 tablet 5   Nebivolol  HCl 20 MG TABS Take 1 tablet (20 mg total) by mouth daily. 30 tablet 5   oxyCODONE -acetaminophen  (PERCOCET/ROXICET) 5-325 MG tablet Take 1-2 tablets by mouth every 4 (four)  hours as needed for moderate pain. 30 tablet 0   spironolactone (ALDACTONE) 25 MG tablet Take 25 mg by mouth daily.     tamsulosin  (FLOMAX ) 0.4 MG CAPS capsule Take 0.4 mg by mouth daily.     No current facility-administered medications on file prior to visit.     ROS see history of present illness  Objective  Physical Exam Vitals:   02/24/24 1349  BP: (!) 152/100  Pulse: 64  Temp: 98 F (36.7 C)  SpO2: 95%    BP Readings from Last 3 Encounters:  02/24/24 (!) 152/100  11/24/23 (!)  140/92  09/10/23 (!) 148/90   Wt Readings from Last 3 Encounters:  02/24/24 277 lb 9.6 oz (125.9 kg)  11/24/23 279 lb 9.6 oz (126.8 kg)  09/10/23 284 lb 3.2 oz (128.9 kg)    Physical Exam Constitutional:      General: He is not in acute distress.    Appearance: Normal appearance.  HENT:     Head: Normocephalic.  Cardiovascular:     Rate and Rhythm: Normal rate and regular rhythm.     Heart sounds: Normal heart sounds.  Pulmonary:     Effort: Pulmonary effort is normal.     Breath sounds: Normal breath sounds.  Skin:    General: Skin is warm and dry.  Neurological:     General: No focal deficit present.     Mental Status: He is alert.  Psychiatric:        Mood and Affect: Mood normal.        Behavior: Behavior normal.      Assessment/Plan: Please see individual problem list.  Primary hypertension Assessment & Plan: Blood pressure elevated in office today, possibly elevated due to pain. He is on amlodipine , hydralazine , losartan , Bystolic , and spironolactone. Adequately controlled per nephrology. The plan is to recheck blood pressure before leaving the office, continue the current antihypertensive regimen, and discuss blood pressure monitoring at home. Follow up with nephrology tomorrow as scheduled.    Depression, recurrent (HCC) Assessment & Plan: His depression is well-managed on Wellbutrin  with no changes in mood, anxiety, or depression reported. Continue Wellbutrin  XL 150 mg daily. Encouraged to contact if worsening symptoms, unusual behavior changes or suicidal thoughts occur.    Allergic rhinitis, unspecified seasonality, unspecified trigger Assessment & Plan: He has chronic rhinitis with persistent rhinorrhea. Previous use of fluticasone  nasal spray was not tolerated due to increased blood pressure. Trial Zyrtec  daily and Azelastine  nasal spray.   Orders: -     Cetirizine  HCl; Take 1 tablet (10 mg total) by mouth daily.  Dispense: 30 tablet; Refill: 11  Stage  3a chronic kidney disease (HCC) Assessment & Plan: He has chronic kidney disease with a nephrology follow-up scheduled for tomorrow. Lab tests will be deferred to nephrology. Continue current medication regimen and avoid NSAIDs.    Chronic midline low back pain without sciatica Assessment & Plan: s/p PLIF L5-S1 on 11/24/2022. He experiences chronic back pain and is considering further surgical intervention, though he is hesitant due to past experiences and family history. Current pain management includes fentanyl  and Percocet. The plan is to continue the current pain management regimen, discuss potential surgical intervention with neurosurgeon, and consider adjusting pain medication based on the surgical decision. Follow up with specialists as scheduled.      Return in about 3 months (around 05/26/2024) for Follow up.   Jake Glance, NP-C [facility] - [facility]

## 2024-02-25 DIAGNOSIS — I509 Heart failure, unspecified: Secondary | ICD-10-CM | POA: Diagnosis not present

## 2024-02-25 DIAGNOSIS — E785 Hyperlipidemia, unspecified: Secondary | ICD-10-CM | POA: Diagnosis not present

## 2024-02-25 DIAGNOSIS — I10 Essential (primary) hypertension: Secondary | ICD-10-CM | POA: Diagnosis not present

## 2024-02-25 DIAGNOSIS — N1831 Chronic kidney disease, stage 3a: Secondary | ICD-10-CM | POA: Diagnosis not present

## 2024-03-02 DIAGNOSIS — M4726 Other spondylosis with radiculopathy, lumbar region: Secondary | ICD-10-CM | POA: Diagnosis not present

## 2024-03-02 DIAGNOSIS — M5412 Radiculopathy, cervical region: Secondary | ICD-10-CM | POA: Diagnosis not present

## 2024-03-02 DIAGNOSIS — M48062 Spinal stenosis, lumbar region with neurogenic claudication: Secondary | ICD-10-CM | POA: Diagnosis not present

## 2024-03-02 DIAGNOSIS — M25561 Pain in right knee: Secondary | ICD-10-CM | POA: Diagnosis not present

## 2024-03-07 ENCOUNTER — Telehealth: Payer: Self-pay | Admitting: Nurse Practitioner

## 2024-03-07 NOTE — Telephone Encounter (Unsigned)
 Copied from CRM #[id number]. Topic: General - Other >> Mar 07, 2024  2:10 PM Gennette ORN wrote: Reason for CRM: [employer] [phone] is calling to see if the 90 day supply of losartan  (COZAAR ) 25 MG tablet has been received by fax.

## 2024-03-08 ENCOUNTER — Other Ambulatory Visit: Payer: Self-pay | Admitting: Nurse Practitioner

## 2024-03-08 DIAGNOSIS — I10 Essential (primary) hypertension: Secondary | ICD-10-CM

## 2024-03-08 MED ORDER — LOSARTAN POTASSIUM 25 MG PO TABS
25.0000 mg | ORAL_TABLET | Freq: Every day | ORAL | 3 refills | Status: DC
Start: 2024-03-08 — End: 2024-03-18

## 2024-03-09 DIAGNOSIS — Z1331 Encounter for screening for depression: Secondary | ICD-10-CM | POA: Diagnosis not present

## 2024-03-09 DIAGNOSIS — G253 Myoclonus: Secondary | ICD-10-CM | POA: Diagnosis not present

## 2024-03-09 DIAGNOSIS — G4733 Obstructive sleep apnea (adult) (pediatric): Secondary | ICD-10-CM | POA: Diagnosis not present

## 2024-03-09 DIAGNOSIS — M5481 Occipital neuralgia: Secondary | ICD-10-CM | POA: Diagnosis not present

## 2024-03-09 DIAGNOSIS — Z8679 Personal history of other diseases of the circulatory system: Secondary | ICD-10-CM | POA: Diagnosis not present

## 2024-03-11 ENCOUNTER — Encounter: Payer: Self-pay | Admitting: Nurse Practitioner

## 2024-03-11 NOTE — Assessment & Plan Note (Signed)
 He has chronic kidney disease with a nephrology follow-up scheduled for tomorrow. Lab tests will be deferred to nephrology. Continue current medication regimen and avoid NSAIDs.

## 2024-03-11 NOTE — Assessment & Plan Note (Signed)
 s/p PLIF L5-S1 on 11/24/2022. He experiences chronic back pain and is considering further surgical intervention, though he is hesitant due to past experiences and family history. Current pain management includes fentanyl  and Percocet. The plan is to continue the current pain management regimen, discuss potential surgical intervention with neurosurgeon, and consider adjusting pain medication based on the surgical decision. Follow up with specialists as scheduled.

## 2024-03-11 NOTE — Assessment & Plan Note (Signed)
 He has chronic rhinitis with persistent rhinorrhea. Previous use of fluticasone  nasal spray was not tolerated due to increased blood pressure. Trial Zyrtec  daily and Azelastine  nasal spray.

## 2024-03-11 NOTE — Assessment & Plan Note (Signed)
 His depression is well-managed on Wellbutrin  with no changes in mood, anxiety, or depression reported. Continue Wellbutrin  XL 150 mg daily. Encouraged to contact if worsening symptoms, unusual behavior changes or suicidal thoughts occur.

## 2024-03-11 NOTE — Assessment & Plan Note (Addendum)
 Blood pressure elevated in office today, possibly elevated due to pain. He is on amlodipine , hydralazine , losartan , Bystolic , and spironolactone. Adequately controlled per nephrology. The plan is to recheck blood pressure before leaving the office, continue the current antihypertensive regimen, and discuss blood pressure monitoring at home. Follow up with nephrology tomorrow as scheduled.

## 2024-03-15 ENCOUNTER — Inpatient Hospital Stay (HOSPITAL_COMMUNITY)
Admission: EM | Admit: 2024-03-15 | Discharge: 2024-03-18 | DRG: 682 | Disposition: A | Discharge status: Home / Self Care | Attending: Internal Medicine | Admitting: Internal Medicine

## 2024-03-15 ENCOUNTER — Other Ambulatory Visit: Payer: Self-pay

## 2024-03-15 ENCOUNTER — Encounter (HOSPITAL_COMMUNITY): Payer: Self-pay

## 2024-03-15 ENCOUNTER — Emergency Department (HOSPITAL_COMMUNITY)

## 2024-03-15 ENCOUNTER — Ambulatory Visit: Payer: Self-pay

## 2024-03-15 DIAGNOSIS — F4310 Post-traumatic stress disorder, unspecified: Secondary | ICD-10-CM | POA: Diagnosis not present

## 2024-03-15 DIAGNOSIS — Z79891 Long term (current) use of opiate analgesic: Secondary | ICD-10-CM

## 2024-03-15 DIAGNOSIS — Z8249 Family history of ischemic heart disease and other diseases of the circulatory system: Secondary | ICD-10-CM

## 2024-03-15 DIAGNOSIS — N400 Enlarged prostate without lower urinary tract symptoms: Secondary | ICD-10-CM | POA: Diagnosis not present

## 2024-03-15 DIAGNOSIS — Z818 Family history of other mental and behavioral disorders: Secondary | ICD-10-CM

## 2024-03-15 DIAGNOSIS — Z87891 Personal history of nicotine dependence: Secondary | ICD-10-CM | POA: Diagnosis not present

## 2024-03-15 DIAGNOSIS — E66813 Obesity, class 3: Secondary | ICD-10-CM | POA: Diagnosis present

## 2024-03-15 DIAGNOSIS — I10 Essential (primary) hypertension: Secondary | ICD-10-CM | POA: Diagnosis present

## 2024-03-15 DIAGNOSIS — Z7902 Long term (current) use of antithrombotics/antiplatelets: Secondary | ICD-10-CM | POA: Diagnosis not present

## 2024-03-15 DIAGNOSIS — E785 Hyperlipidemia, unspecified: Secondary | ICD-10-CM | POA: Diagnosis not present

## 2024-03-15 DIAGNOSIS — N183 Chronic kidney disease, stage 3 unspecified: Secondary | ICD-10-CM | POA: Diagnosis present

## 2024-03-15 DIAGNOSIS — Z79899 Other long term (current) drug therapy: Secondary | ICD-10-CM

## 2024-03-15 DIAGNOSIS — E860 Dehydration: Secondary | ICD-10-CM | POA: Diagnosis not present

## 2024-03-15 DIAGNOSIS — G894 Chronic pain syndrome: Secondary | ICD-10-CM | POA: Diagnosis not present

## 2024-03-15 DIAGNOSIS — R0602 Shortness of breath: Secondary | ICD-10-CM | POA: Diagnosis not present

## 2024-03-15 DIAGNOSIS — F3181 Bipolar II disorder: Secondary | ICD-10-CM | POA: Diagnosis not present

## 2024-03-15 DIAGNOSIS — Z85528 Personal history of other malignant neoplasm of kidney: Secondary | ICD-10-CM

## 2024-03-15 DIAGNOSIS — U071 COVID-19: Principal | ICD-10-CM | POA: Diagnosis present

## 2024-03-15 DIAGNOSIS — R0989 Other specified symptoms and signs involving the circulatory and respiratory systems: Secondary | ICD-10-CM | POA: Diagnosis not present

## 2024-03-15 DIAGNOSIS — I129 Hypertensive chronic kidney disease with stage 1 through stage 4 chronic kidney disease, or unspecified chronic kidney disease: Secondary | ICD-10-CM | POA: Diagnosis present

## 2024-03-15 DIAGNOSIS — Z905 Acquired absence of kidney: Secondary | ICD-10-CM | POA: Diagnosis not present

## 2024-03-15 DIAGNOSIS — Z6841 Body Mass Index (BMI) 40.0 and over, adult: Secondary | ICD-10-CM

## 2024-03-15 DIAGNOSIS — F0393 Unspecified dementia, unspecified severity, with mood disturbance: Secondary | ICD-10-CM | POA: Diagnosis present

## 2024-03-15 DIAGNOSIS — R531 Weakness: Secondary | ICD-10-CM | POA: Diagnosis not present

## 2024-03-15 DIAGNOSIS — G4733 Obstructive sleep apnea (adult) (pediatric): Secondary | ICD-10-CM | POA: Diagnosis present

## 2024-03-15 DIAGNOSIS — Z8052 Family history of malignant neoplasm of bladder: Secondary | ICD-10-CM | POA: Diagnosis not present

## 2024-03-15 DIAGNOSIS — I69354 Hemiplegia and hemiparesis following cerebral infarction affecting left non-dominant side: Secondary | ICD-10-CM

## 2024-03-15 DIAGNOSIS — R059 Cough, unspecified: Secondary | ICD-10-CM | POA: Diagnosis not present

## 2024-03-15 DIAGNOSIS — F339 Major depressive disorder, recurrent, unspecified: Secondary | ICD-10-CM | POA: Diagnosis present

## 2024-03-15 DIAGNOSIS — N179 Acute kidney failure, unspecified: Secondary | ICD-10-CM | POA: Diagnosis not present

## 2024-03-15 DIAGNOSIS — Z888 Allergy status to other drugs, medicaments and biological substances status: Secondary | ICD-10-CM

## 2024-03-15 DIAGNOSIS — Z87442 Personal history of urinary calculi: Secondary | ICD-10-CM | POA: Diagnosis not present

## 2024-03-15 DIAGNOSIS — G9389 Other specified disorders of brain: Secondary | ICD-10-CM | POA: Diagnosis not present

## 2024-03-15 DIAGNOSIS — Z83438 Family history of other disorder of lipoprotein metabolism and other lipidemia: Secondary | ICD-10-CM

## 2024-03-15 DIAGNOSIS — R29818 Other symptoms and signs involving the nervous system: Secondary | ICD-10-CM | POA: Diagnosis not present

## 2024-03-15 DIAGNOSIS — Z8261 Family history of arthritis: Secondary | ICD-10-CM

## 2024-03-15 DIAGNOSIS — Z820 Family history of epilepsy and other diseases of the nervous system: Secondary | ICD-10-CM

## 2024-03-15 LAB — I-STAT CHEM 8, ED
BUN: 44 mg/dL — ABNORMAL HIGH (ref 6–20)
Calcium, Ion: 1.11 mmol/L — ABNORMAL LOW (ref 1.15–1.40)
Chloride: 103 mmol/L (ref 98–111)
Creatinine, Ser: 4.3 mg/dL — ABNORMAL HIGH (ref 0.61–1.24)
Glucose, Bld: 94 mg/dL (ref 70–99)
HCT: 42 % (ref 39.0–52.0)
Hemoglobin: 14.3 g/dL (ref 13.0–17.0)
Potassium: 3.8 mmol/L (ref 3.5–5.1)
Sodium: 137 mmol/L (ref 135–145)
TCO2: 22 mmol/L (ref 22–32)

## 2024-03-15 LAB — COMPREHENSIVE METABOLIC PANEL WITH GFR
ALT: 28 U/L (ref 0–44)
AST: 50 U/L — ABNORMAL HIGH (ref 15–41)
Albumin: 3.4 g/dL — ABNORMAL LOW (ref 3.5–5.0)
Alkaline Phosphatase: 63 U/L (ref 38–126)
Anion gap: 15 (ref 5–15)
BUN: 47 mg/dL — ABNORMAL HIGH (ref 6–20)
CO2: 19 mmol/L — ABNORMAL LOW (ref 22–32)
Calcium: 8.3 mg/dL — ABNORMAL LOW (ref 8.9–10.3)
Chloride: 101 mmol/L (ref 98–111)
Creatinine, Ser: 4.35 mg/dL — ABNORMAL HIGH (ref 0.61–1.24)
GFR, Estimated: 15 mL/min — ABNORMAL LOW (ref >60)
Glucose, Bld: 97 mg/dL (ref 70–99)
Potassium: 3.8 mmol/L (ref 3.5–5.1)
Sodium: 135 mmol/L (ref 135–145)
Total Bilirubin: 0.6 mg/dL (ref 0.0–1.2)
Total Protein: 6.6 g/dL (ref 6.5–8.1)

## 2024-03-15 LAB — CBC WITH DIFFERENTIAL/PLATELET
Abs Immature Granulocytes: 0.02 K/uL (ref 0.00–0.07)
Basophils Absolute: 0 K/uL (ref 0.0–0.1)
Basophils Relative: 1 %
Eosinophils Absolute: 0 K/uL (ref 0.0–0.5)
Eosinophils Relative: 0 %
HCT: 42.4 % (ref 39.0–52.0)
Hemoglobin: 14 g/dL (ref 13.0–17.0)
Immature Granulocytes: 0 %
Lymphocytes Relative: 22 %
Lymphs Abs: 1.7 K/uL (ref 0.7–4.0)
MCH: 30.5 pg (ref 26.0–34.0)
MCHC: 33 g/dL (ref 30.0–36.0)
MCV: 92.4 fL (ref 80.0–100.0)
Monocytes Absolute: 0.9 K/uL (ref 0.1–1.0)
Monocytes Relative: 12 %
Neutro Abs: 5.1 K/uL (ref 1.7–7.7)
Neutrophils Relative %: 65 %
Platelets: 193 K/uL (ref 150–400)
RBC: 4.59 MIL/uL (ref 4.22–5.81)
RDW: 13.6 % (ref 11.5–15.5)
WBC: 7.8 K/uL (ref 4.0–10.5)
nRBC: 0 % (ref 0.0–0.2)

## 2024-03-15 LAB — ETHANOL: Alcohol, Ethyl (B): <15 mg/dL (ref <15)

## 2024-03-15 LAB — RESP PANEL BY RT-PCR (RSV, FLU A&B, COVID)  RVPGX2
Influenza A by PCR: NEGATIVE
Influenza B by PCR: NEGATIVE
Resp Syncytial Virus by PCR: NEGATIVE
SARS Coronavirus 2 by RT PCR: POSITIVE — AB

## 2024-03-15 LAB — I-STAT CG4 LACTIC ACID, ED
Lactic Acid, Venous: 2.1 mmol/L (ref 0.5–1.9)
Lactic Acid, Venous: 1.1 mmol/L (ref 0.5–1.9)

## 2024-03-15 LAB — LIPASE, BLOOD: Lipase: 35 U/L (ref 11–51)

## 2024-03-15 MED ORDER — LACTATED RINGERS IV BOLUS
1000.0000 mL | Freq: Once | INTRAVENOUS | Status: AC
  Administered 2024-03-15: 1000 mL via INTRAVENOUS

## 2024-03-15 MED ORDER — LACTATED RINGERS IV SOLN: INTRAVENOUS | Status: AC

## 2024-03-15 NOTE — Telephone Encounter (Signed)
 FYI Only or Action Required?: FYI only for provider.  Patient was last seen in primary care on 02/24/2024 by Gretel App, NP.  Called Nurse Triage reporting Neurologic Problem.  Symptoms began several days ago.  Interventions attempted: Rest, hydration, or home remedies.  Symptoms are: rapidly worsening.  Triage Disposition: Call EMS 911 Now  Patient/caregiver understands and will follow disposition?: NoCopied from CRM #[id number]. Topic: Clinical - Red Word Triage >> Mar 15, 2024  3:37 PM Frederich PARAS wrote: [facility] that prompted transfer to Nurse Triage: confusion  pt calling to schedule an apt, pt loss balance, balance messedup, cant focus, drainage with nostril, loose stools 2 nights ago, dehydration. water is not helping. Reason for Disposition  [1] SEVERE weakness (e.g., unable to walk or barely able to walk, requires support) AND [2] new-onset or getting worse  Answer Assessment - Initial Assessment Questions Pt has chronic numbness/weakness and has discussed with neurologist. Pt has had 2 strokes. Speech was a little slurred. RN advised ED. Pt denied RN calling 911.  I have 2 choices on who can take me. I'll go within hour. I have to get my dog taken care of first. CAL notified and sending to supervisor.       1. SYMPTOM: What is the main symptom you are concerned about? (e.g., weakness, numbness)     Loss of balance 2. ONSET: When did this start? (e.g., minutes, hours, days; while sleeping)     2 days 3. LAST NORMAL: When was the last time you (the patient) were normal (no symptoms)?     3 days 4. PATTERN Does this come and go, or has it been constant since it started?  Is it present now?     Constant  5. CARDIAC SYMPTOMS: Have you had any of the following symptoms: chest pain, difficulty breathing, palpitations?     denies 6. NEUROLOGIC SYMPTOMS: Have you had any of the following symptoms: headache, dizziness, vision loss, double vision, changes in speech,  unsteady on your feet?     Dizzy, confusion, visual changes 7. OTHER SYMPTOMS: Do you have any other symptoms?      loose stool, can't focus, dehydration, runny nose  Protocols used: Neurologic Deficit-A-AH

## 2024-03-15 NOTE — Telephone Encounter (Signed)
2nd attempt to contact pt.

## 2024-03-15 NOTE — ED Provider Notes (Signed)
 [place] [facility] AT [facility] Provider Note   CSN: [id number] Arrival date & time: 03/15/24  8176     Patient presents with: Weakness   Jake Brown is a 58 y.o. male.   Patient is a 58 year old male who presents with weakness.  He has a history of prior intracranial hemorrhage, hypertension, chronic kidney disease, obstructive sleep apnea, bipolar disorder, myoclonic jerks.  He was admitted in January with a cerebellar hemorrhage thought to be related to hypertensive emergency.  He states that he started having diarrhea 2 days ago.  He said it woke him up from sleep.  He only had diarrhea for the 1 night.  He denies any ongoing diarrhea but he does have general weakness.  He says he has not really been eating or drinking well.  He denies any nausea or vomiting.  No fevers.  He does have some runny nose and congestion as well as a minor cough.  No shortness of breath.  He feels dizzy and lightheaded and overall weak.  He says he has been off balance.  No numbness or weakness to his extremities.  No numbness to his face.  No slurred speech or difficulty getting his words out.  The triage note said that he had difficulty forming sentences but the patient and the family member who is at bedside state that he just been weaker and at times she has a little more trouble understanding him but it seems more due to general fatigue rather than aphasia.  He denies any associated abdominal pain.  No urinary symptoms.       Prior to Admission medications   Medication Sig Start Date End Date Taking? Authorizing Provider  amLODipine  (NORVASC ) 5 MG tablet Take 5 mg by mouth daily. 04/06/15   [provider]  atorvastatin  (LIPITOR ) 40 MG tablet TAKE ONE TABLET BY MOUTH DAILY AT 6 P.M. 06/02/23   Gretel App, NP  baclofen  (LIORESAL ) 10 MG tablet Take 10 mg by mouth 3 (three) times daily as needed for muscle spasms. 04/30/20   [provider]  buPROPion   (WELLBUTRIN  XL) 150 MG 24 hr tablet TAKE 1 TABLET BY MOUTH DAILY 11/18/23   Gretel App, NP  cetirizine  (ZYRTEC ) 10 MG tablet Take 1 tablet (10 mg total) by mouth daily. 02/24/24   Gretel App, NP  Cholecalciferol  (VITAMIN D3) 125 MCG (5000 UT) CAPS Take 1 capsule (5,000 Units total) by mouth daily. 01/14/23   Gretel App, NP  clopidogrel  (PLAVIX ) 75 MG tablet Take 75 mg by mouth daily. 01/25/18   [provider]  Cyanocobalamin  (B-12) 5000 MCG CAPS Take 1 capsule by mouth daily. Patient takes in a gummy form.    [provider]  fentaNYL  (DURAGESIC ) 12 MCG/HR Place 1 patch onto the skin every 3 (three) days. Patch was applied on 07/27/2023.    [provider]  gabapentin  (NEURONTIN ) 300 MG capsule Take 1 capsule (300 mg total) by mouth 3 (three) times daily. 07/27/23   Viviann Pastor, MD  hydrALAZINE  (APRESOLINE ) 50 MG tablet Take 1 tablet (50 mg total) by mouth 3 (three) times daily. 08/11/23   Gretel App, NP  losartan  (COZAAR ) 25 MG tablet Take 1 tablet (25 mg total) by mouth daily. 03/08/24   Gretel App, NP  Nebivolol  HCl 20 MG TABS Take 1 tablet (20 mg total) by mouth daily. 08/11/23   Gretel App, NP  oxyCODONE -acetaminophen  (PERCOCET/ROXICET) 5-325 MG tablet Take 1-2 tablets by mouth every 4 (four) hours as needed  for moderate pain. 11/28/22   Mavis Purchase, MD  spironolactone  (ALDACTONE ) 25 MG tablet Take 25 mg by mouth daily.    [provider]  tamsulosin  (FLOMAX ) 0.4 MG CAPS capsule Take 0.4 mg by mouth daily. 10/05/23   [provider]    Allergies: Patient has no known allergies.    Review of Systems  Constitutional:  Positive for fatigue. Negative for chills, diaphoresis and fever.  HENT:  Positive for congestion and rhinorrhea. Negative for sneezing.   Eyes: Negative.   Respiratory:  Positive for cough. Negative for chest tightness and shortness of breath.   Cardiovascular:  Negative for chest pain and leg swelling.   Gastrointestinal:  Positive for diarrhea. Negative for abdominal pain, nausea and vomiting.  Genitourinary:  Negative for difficulty urinating, flank pain, frequency and hematuria.  Musculoskeletal:  Negative for arthralgias and back pain.  Skin:  Negative for rash.  Neurological:  Positive for weakness (Generalized) and light-headedness. Negative for speech difficulty, numbness and headaches.    Updated Vital Signs BP 115/69   Pulse (!) 52   Temp 98.1 F (36.7 C) (Rectal)   Resp 11   Ht 5' 8 (1.727 m)   Wt 117.9 kg   SpO2 100%   BMI 39.53 kg/m   Physical Exam Constitutional:      Appearance: He is well-developed.     Comments: Appears disheveled.  Has what appears to be some dried stool on his legs.  HENT:     Head: Normocephalic and atraumatic.     Mouth/Throat:     Mouth: Mucous membranes are moist.  Eyes:     Pupils: Pupils are equal, round, and reactive to light.  Cardiovascular:     Rate and Rhythm: Normal rate and regular rhythm.     Heart sounds: Normal heart sounds.  Pulmonary:     Effort: Pulmonary effort is normal. No respiratory distress.     Breath sounds: Normal breath sounds. No wheezing or rales.  Chest:     Chest wall: No tenderness.  Abdominal:     General: Bowel sounds are normal.     Palpations: Abdomen is soft.     Tenderness: There is no abdominal tenderness. There is no guarding or rebound.  Musculoskeletal:        General: Normal range of motion.     Cervical back: Normal range of motion and neck supple.  Lymphadenopathy:     Cervical: No cervical adenopathy.  Skin:    General: Skin is warm and dry.     Findings: No rash.  Neurological:     Mental Status: He is alert and oriented to person, place, and time.     Comments: Motor 5/5 all extremities Sensation grossly intact to LT all extremities Finger to Nose intact, no pronator drift CN II-XII grossly intact Visual fields full to confrontation      (all labs ordered are listed, but  only abnormal results are displayed) Labs Reviewed  RESP PANEL BY RT-PCR (RSV, FLU A&B, COVID)  RVPGX2 - Abnormal; Notable for the following components:      Result Value   SARS Coronavirus 2 by RT PCR POSITIVE (*)    All other components within normal limits  COMPREHENSIVE METABOLIC PANEL WITH GFR - Abnormal; Notable for the following components:   CO2 19 (*)    BUN 47 (*)    Creatinine, Ser 4.35 (*)    Calcium  8.3 (*)    Albumin 3.4 (*)    AST 50 (*)  GFR, Estimated 15 (*)    All other components within normal limits  I-STAT CG4 LACTIC ACID, ED - Abnormal; Notable for the following components:   Lactic Acid, Venous 2.1 (*)    All other components within normal limits  I-STAT CHEM 8, ED - Abnormal; Notable for the following components:   BUN 44 (*)    Creatinine, Ser 4.30 (*)    Calcium , Ion 1.11 (*)    All other components within normal limits  ETHANOL  LIPASE, BLOOD  CBC WITH DIFFERENTIAL/PLATELET  I-STAT CG4 LACTIC ACID, ED    EKG: EKG Interpretation Date/Time:  Tuesday March 15 2024 18:39:49 EDT Ventricular Rate:  63 PR Interval:  162 QRS Duration:  107 QT Interval:  430 QTC Calculation: 441 R Axis:   84  Text Interpretation: Sinus rhythm Borderline right axis deviation since last tracing no significant change Confirmed by Lenor Hollering [phone]) on 03/15/2024 7:48:24 PM  Radiology: CT Head Wo Contrast Result Date: 03/15/2024 CLINICAL DATA:  Acute neuro deficit. EXAM: CT HEAD WITHOUT CONTRAST TECHNIQUE: Contiguous axial images were obtained from the base of the skull through the vertex without intravenous contrast. RADIATION DOSE REDUCTION: This exam was performed according to the departmental dose-optimization program which includes automated exposure control, adjustment of the mA and/or kV according to patient size and/or use of iterative reconstruction technique. COMPARISON:  September 04, 2023 FINDINGS: Brain: There is generalized cerebral atrophy with widening of  the extra-axial spaces and ventricular dilatation. There are areas of decreased attenuation within the white matter tracts of the supratentorial brain, consistent with microvascular disease changes. Small, chronic right basal ganglia lacunar infarcts are noted. Vascular: No hyperdense vessel or unexpected calcification. Skull: Normal. Negative for fracture or focal lesion. Sinuses/Orbits: No acute finding. Other: None. IMPRESSION: 1. Generalized cerebral atrophy and microvascular disease changes of the supratentorial brain. 2. Small, chronic right basal ganglia lacunar infarcts. 3. No acute intracranial abnormality. Electronically Signed   By: Suzen Dials M.D.   On: 03/15/2024 20:23   DG Chest Port 1 View Result Date: 03/15/2024 CLINICAL DATA:  Cough and congestion. Sudden onset of balance problems, weakness, dropping things. EXAM: PORTABLE CHEST 1 VIEW COMPARISON:  07/28/2023 FINDINGS: Heart size and pulmonary vascularity are normal. Lungs are clear. No pleural effusion or pneumothorax. Degenerative changes in the spine and shoulders. IMPRESSION: No active disease. Electronically Signed   By: Elsie Gravely M.D.   On: 03/15/2024 19:47     Procedures   Medications Ordered in the ED  lactated ringers  infusion (has no administration in time range)  lactated ringers  bolus 1,000 mL (0 mLs Intravenous Stopped 03/15/24 2129)  lactated ringers  bolus 1,000 mL (1,000 mLs Intravenous New Bag/Given 03/15/24 2130)                                    Medical Decision Making Amount and/or Complexity of Data Reviewed Labs: ordered. Radiology: ordered.  Risk Decision regarding hospitalization.   This patient presents to the ED for concern of weakness, this involves an extensive number of treatment options, and is a complaint that carries with it a high risk of complications and morbidity.  I considered the following differential and admission for this acute, potentially life threatening condition.   The differential diagnosis includes dehydration, electrolyte abnormality, sepsis, pneumonia  MDM:    Patient is a 58 year old who presents with weakness.  He is noted to be markedly hypotensive on arrival  with systolic blood pressures in the 70s and 80s.  He was started on IV fluids.  His blood pressure improved after 2 L of IV fluids.  It is now in the 100s systolic.  He has had a little nasal congestion.  His COVID test is positive.  Chest x-ray does not show any evidence of pneumonia.  His lactate was mildly elevated but he does not have a fever or other concerns for sepsis.  He likely has a viral infection with resulting dehydration.  His creatinine is markedly elevated.  His potassium is normal.  Will check a bladder scan to make sure he is not retaining but he does not have any associated abdominal pain.  No clinical symptoms that would be more concerning for meningitis.  Discussed with Dr. Franky who admit the patient for further treatment.  CRITICAL CARE Performed by: Andrea Ness Total critical care time: 70 minutes Critical care time was exclusive of separately billable procedures and treating other patients. Critical care was necessary to treat or prevent imminent or life-threatening deterioration. Critical care was time spent personally by me on the following activities: development of treatment plan with patient and/or surrogate as well as nursing, discussions with consultants, evaluation of patient's response to treatment, examination of patient, obtaining history from patient or surrogate, ordering and performing treatments and interventions, ordering and review of laboratory studies, ordering and review of radiographic studies, pulse oximetry and re-evaluation of patient's condition.   (Labs, imaging, consults)  Labs: I Ordered, and personally interpreted labs.  The pertinent results include: COVID-positive, elevated creatinine, normal potassium  Imaging Studies ordered: I  ordered imaging studies including chest x-ray I independently visualized and interpreted imaging. I agree with the radiologist interpretation  Additional history obtained from family member.  External records from outside source obtained and reviewed including history  Cardiac Monitoring: The patient was maintained on a cardiac monitor.  If on the cardiac monitor, I personally viewed and interpreted the cardiac monitored which showed an underlying rhythm of: Sinus rhythm  Reevaluation: After the interventions noted above, I reevaluated the patient and found that they have :improved  Social Determinants of Health:    Disposition: Admit to hospital  Co morbidities that complicate the patient evaluation  Past Medical History:  Diagnosis Date   Anxiety    Arthritis    Bipolar disorder (HCC)    Cancer of kidney (HCC) 2001   right renal carcinoma (nephrectomy )   Chronic kidney disease    stage 3    Chronic low back pain with sciatica 05/04/2019   Degenerative disc disease, cervical    Degenerative disc disease, lumbar    Dementia (HCC)    Depression    Difficult intubation    no problems 01/17/12-stated small esophagus   Dysplastic nevus 05/21/2022   left back on [employer]apers leg of tattoo, moderate atypia   Heart murmur 1983 noted   no symptoms   History of hiatal hernia    History of kidney stones    Hyperlipidemia    Hypertension    Nephrolithiasis    PONV (postoperative nausea and vomiting)    PTSD (post-traumatic stress disorder)    Sleep apnea    pt states he uses CPAP some days   Stroke (HCC)    09/2017      Medicines Meds ordered this encounter  Medications   lactated ringers  bolus 1,000 mL   lactated ringers  bolus 1,000 mL   lactated ringers  infusion    I have reviewed  the patients home medicines and have made adjustments as needed  Problem List / ED Course: Problem List Items Addressed This Visit       Genitourinary   * (Principal) ARF (acute  renal failure) (HCC)   Other Visit Diagnoses       COVID-19 virus infection    -  Primary                Final diagnoses:  COVID-19 virus infection  Acute renal failure, unspecified acute renal failure type [facility])    ED Discharge Orders     None          Lenor Hollering, MD 03/15/24 2217

## 2024-03-15 NOTE — ED Notes (Signed)
 Patient transported to CT

## 2024-03-15 NOTE — ED Triage Notes (Signed)
 PT reports he started experiencing confusion around Friday with some unsteady balance and weakness. Typically walk with a cane. Pt reports he has has 2 strokes in the past. Reports having loose stools Saturday night while he was asleep.   Vitals BP 85/56 P59 R17   O2 97 RA

## 2024-03-15 NOTE — ED Triage Notes (Addendum)
 The patient reports sudden onset of balance problems, trouble forming sentences, has been dropping things, weakness, has a feeling of being dehydrated, dry mouth; onset about 3 or 4 days ago. He was sent by his PCP today with concerns of stroke. Hx of stroke.

## 2024-03-16 ENCOUNTER — Encounter (HOSPITAL_COMMUNITY): Payer: Self-pay | Admitting: Internal Medicine

## 2024-03-16 DIAGNOSIS — Z8249 Family history of ischemic heart disease and other diseases of the circulatory system: Secondary | ICD-10-CM | POA: Diagnosis not present

## 2024-03-16 DIAGNOSIS — Z85528 Personal history of other malignant neoplasm of kidney: Secondary | ICD-10-CM | POA: Diagnosis not present

## 2024-03-16 DIAGNOSIS — Z79891 Long term (current) use of opiate analgesic: Secondary | ICD-10-CM | POA: Diagnosis not present

## 2024-03-16 DIAGNOSIS — Z7902 Long term (current) use of antithrombotics/antiplatelets: Secondary | ICD-10-CM | POA: Diagnosis not present

## 2024-03-16 DIAGNOSIS — F0393 Unspecified dementia, unspecified severity, with mood disturbance: Secondary | ICD-10-CM | POA: Diagnosis present

## 2024-03-16 DIAGNOSIS — U071 COVID-19: Principal | ICD-10-CM | POA: Insufficient documentation

## 2024-03-16 DIAGNOSIS — N400 Enlarged prostate without lower urinary tract symptoms: Secondary | ICD-10-CM | POA: Diagnosis present

## 2024-03-16 DIAGNOSIS — I129 Hypertensive chronic kidney disease with stage 1 through stage 4 chronic kidney disease, or unspecified chronic kidney disease: Secondary | ICD-10-CM | POA: Diagnosis present

## 2024-03-16 DIAGNOSIS — N183 Chronic kidney disease, stage 3 unspecified: Secondary | ICD-10-CM | POA: Diagnosis present

## 2024-03-16 DIAGNOSIS — G894 Chronic pain syndrome: Secondary | ICD-10-CM | POA: Diagnosis present

## 2024-03-16 DIAGNOSIS — Z87891 Personal history of nicotine dependence: Secondary | ICD-10-CM | POA: Diagnosis not present

## 2024-03-16 DIAGNOSIS — I10 Essential (primary) hypertension: Secondary | ICD-10-CM

## 2024-03-16 DIAGNOSIS — Z905 Acquired absence of kidney: Secondary | ICD-10-CM | POA: Diagnosis not present

## 2024-03-16 DIAGNOSIS — Z6841 Body Mass Index (BMI) 40.0 and over, adult: Secondary | ICD-10-CM | POA: Diagnosis not present

## 2024-03-16 DIAGNOSIS — N179 Acute kidney failure, unspecified: Secondary | ICD-10-CM

## 2024-03-16 DIAGNOSIS — E860 Dehydration: Secondary | ICD-10-CM | POA: Diagnosis present

## 2024-03-16 DIAGNOSIS — E785 Hyperlipidemia, unspecified: Secondary | ICD-10-CM | POA: Diagnosis present

## 2024-03-16 DIAGNOSIS — F4310 Post-traumatic stress disorder, unspecified: Secondary | ICD-10-CM | POA: Diagnosis present

## 2024-03-16 DIAGNOSIS — Z87442 Personal history of urinary calculi: Secondary | ICD-10-CM | POA: Diagnosis not present

## 2024-03-16 DIAGNOSIS — G4733 Obstructive sleep apnea (adult) (pediatric): Secondary | ICD-10-CM | POA: Diagnosis present

## 2024-03-16 DIAGNOSIS — E66813 Obesity, class 3: Secondary | ICD-10-CM | POA: Diagnosis present

## 2024-03-16 DIAGNOSIS — I69354 Hemiplegia and hemiparesis following cerebral infarction affecting left non-dominant side: Secondary | ICD-10-CM | POA: Diagnosis not present

## 2024-03-16 DIAGNOSIS — Z79899 Other long term (current) drug therapy: Secondary | ICD-10-CM | POA: Diagnosis not present

## 2024-03-16 DIAGNOSIS — Z8052 Family history of malignant neoplasm of bladder: Secondary | ICD-10-CM | POA: Diagnosis not present

## 2024-03-16 DIAGNOSIS — F3181 Bipolar II disorder: Secondary | ICD-10-CM | POA: Diagnosis present

## 2024-03-16 LAB — CBC WITH DIFFERENTIAL/PLATELET
Abs Immature Granulocytes: 0.02 K/uL (ref 0.00–0.07)
Basophils Absolute: 0 K/uL (ref 0.0–0.1)
Basophils Relative: 1 %
Eosinophils Absolute: 0 K/uL (ref 0.0–0.5)
Eosinophils Relative: 0 %
HCT: 39.3 % (ref 39.0–52.0)
Hemoglobin: 13.1 g/dL (ref 13.0–17.0)
Immature Granulocytes: 0 %
Lymphocytes Relative: 33 %
Lymphs Abs: 1.7 K/uL (ref 0.7–4.0)
MCH: 30.9 pg (ref 26.0–34.0)
MCHC: 33.3 g/dL (ref 30.0–36.0)
MCV: 92.7 fL (ref 80.0–100.0)
Monocytes Absolute: 0.5 K/uL (ref 0.1–1.0)
Monocytes Relative: 10 %
Neutro Abs: 3 K/uL (ref 1.7–7.7)
Neutrophils Relative %: 56 %
Platelets: 133 K/uL — ABNORMAL LOW (ref 150–400)
RBC: 4.24 MIL/uL (ref 4.22–5.81)
RDW: 13.7 % (ref 11.5–15.5)
WBC: 5.3 K/uL (ref 4.0–10.5)
nRBC: 0 % (ref 0.0–0.2)

## 2024-03-16 LAB — BASIC METABOLIC PANEL WITH GFR
Anion gap: 12 (ref 5–15)
BUN: 40 mg/dL — ABNORMAL HIGH (ref 6–20)
CO2: 20 mmol/L — ABNORMAL LOW (ref 22–32)
Calcium: 8.3 mg/dL — ABNORMAL LOW (ref 8.9–10.3)
Chloride: 102 mmol/L (ref 98–111)
Creatinine, Ser: 3.09 mg/dL — ABNORMAL HIGH (ref 0.61–1.24)
GFR, Estimated: 22 mL/min — ABNORMAL LOW (ref >60)
Glucose, Bld: 87 mg/dL (ref 70–99)
Potassium: 3.8 mmol/L (ref 3.5–5.1)
Sodium: 134 mmol/L — ABNORMAL LOW (ref 135–145)

## 2024-03-16 LAB — HEPATIC FUNCTION PANEL
ALT: 27 U/L (ref 0–44)
AST: 49 U/L — ABNORMAL HIGH (ref 15–41)
Albumin: 3 g/dL — ABNORMAL LOW (ref 3.5–5.0)
Alkaline Phosphatase: 58 U/L (ref 38–126)
Bilirubin, Direct: 0.1 mg/dL (ref 0.0–0.2)
Indirect Bilirubin: 0.5 mg/dL (ref 0.3–0.9)
Total Bilirubin: 0.6 mg/dL (ref 0.0–1.2)
Total Protein: 5.8 g/dL — ABNORMAL LOW (ref 6.5–8.1)

## 2024-03-16 LAB — C-REACTIVE PROTEIN: CRP: 0.6 mg/dL (ref <1.0)

## 2024-03-16 LAB — D-DIMER, QUANTITATIVE: D-Dimer, Quant: 0.38 ug{FEU}/mL (ref 0.00–0.50)

## 2024-03-16 LAB — TSH: TSH: 0.431 u[IU]/mL (ref 0.350–4.500)

## 2024-03-16 MED ORDER — OXYCODONE-ACETAMINOPHEN 10-325 MG PO TABS: 1.0000 | ORAL_TABLET | Freq: Three times a day (TID) | ORAL | Status: DC | PRN

## 2024-03-16 MED ORDER — ATORVASTATIN CALCIUM 40 MG PO TABS
40.0000 mg | ORAL_TABLET | Freq: Every day | ORAL | Status: DC
  Administered 2024-03-16 – 2024-03-17 (×2): 40 mg via ORAL
  Filled 2024-03-16 (×2): qty 1

## 2024-03-16 MED ORDER — CLOPIDOGREL BISULFATE 75 MG PO TABS
75.0000 mg | ORAL_TABLET | Freq: Every day | ORAL | Status: DC
  Administered 2024-03-16 – 2024-03-18 (×3): 75 mg via ORAL
  Filled 2024-03-16 (×3): qty 1

## 2024-03-16 MED ORDER — OXYCODONE HCL 5 MG PO TABS
5.0000 mg | ORAL_TABLET | Freq: Three times a day (TID) | ORAL | Status: DC | PRN
  Administered 2024-03-17 – 2024-03-18 (×3): 5 mg via ORAL
  Filled 2024-03-16 (×3): qty 1

## 2024-03-16 MED ORDER — VITAMIN B-12 1000 MCG PO TABS
5000.0000 ug | ORAL_TABLET | Freq: Every day | ORAL | Status: DC
  Administered 2024-03-16 – 2024-03-18 (×3): 5000 ug via ORAL
  Filled 2024-03-16 (×3): qty 5

## 2024-03-16 MED ORDER — BUPROPION HCL ER (XL) 150 MG PO TB24
150.0000 mg | ORAL_TABLET | Freq: Every day | ORAL | Status: DC
  Administered 2024-03-16 – 2024-03-17 (×2): 150 mg via ORAL
  Filled 2024-03-16 (×2): qty 1

## 2024-03-16 MED ORDER — ONDANSETRON HCL 4 MG/2ML IJ SOLN
4.0000 mg | Freq: Four times a day (QID) | INTRAMUSCULAR | Status: DC | PRN
  Administered 2024-03-16: 4 mg via INTRAVENOUS
  Filled 2024-03-16: qty 2

## 2024-03-16 MED ORDER — OXYCODONE-ACETAMINOPHEN 5-325 MG PO TABS
1.0000 | ORAL_TABLET | Freq: Three times a day (TID) | ORAL | Status: DC | PRN
  Administered 2024-03-16 – 2024-03-17 (×3): 1 via ORAL
  Filled 2024-03-16 (×3): qty 1

## 2024-03-16 MED ORDER — ACETAMINOPHEN 650 MG RE SUPP: 650.0000 mg | Freq: Four times a day (QID) | RECTAL | Status: DC | PRN

## 2024-03-16 MED ORDER — ENOXAPARIN SODIUM 30 MG/0.3ML IJ SOSY
30.0000 mg | PREFILLED_SYRINGE | INTRAMUSCULAR | Status: DC
  Administered 2024-03-16 – 2024-03-17 (×2): 30 mg via SUBCUTANEOUS
  Filled 2024-03-16 (×2): qty 0.3

## 2024-03-16 MED ORDER — ACETAMINOPHEN 325 MG PO TABS: 650.0000 mg | ORAL_TABLET | Freq: Four times a day (QID) | ORAL | Status: DC | PRN

## 2024-03-16 MED ORDER — TAMSULOSIN HCL 0.4 MG PO CAPS
0.4000 mg | ORAL_CAPSULE | Freq: Every day | ORAL | Status: DC
  Administered 2024-03-16 – 2024-03-17 (×2): 0.4 mg via ORAL
  Filled 2024-03-16 (×2): qty 1

## 2024-03-16 MED ORDER — HYDRALAZINE HCL 20 MG/ML IJ SOLN
5.0000 mg | INTRAMUSCULAR | Status: DC | PRN
  Administered 2024-03-18: 5 mg via INTRAVENOUS
  Filled 2024-03-16: qty 1

## 2024-03-16 MED ORDER — FENTANYL 12 MCG/HR TD PT72
1.0000 | MEDICATED_PATCH | TRANSDERMAL | Status: DC
  Administered 2024-03-16: 1 via TRANSDERMAL
  Filled 2024-03-16: qty 1
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
## 2024-03-16 NOTE — Care Plan (Signed)
 This 58 yrs old Male with history of prior stroke and left cerebellar hemorrhage likely from hypertensive emergency in January 2025 with resultant left-sided hemiparesis, hypertension, Chronic kidney disease stage III ( baseline creatinine around 1.8 ), BPH, depression, sleep apnea presents to the ED with c/o:  Increasing weakness, Confusion for last 2 to 3 days.  Patient also reports nasal congestion and cough.  Denies any nausea, vomiting or diarrhea.  Patient reports presyncopal episodes.  He was hypotensive on arrival in the ED, systolic BP 80, Serum creatinine 4.3.  COVID test +.  Lactic acid 2.1.  Patient was given IV fluid resuscitation. Lactic acid improved.  Patient was admitted for acute kidney injury in the setting of COVID infection.  Chest x-ray unremarkable.  Patient remains on room air, CT head did not show any acute abnormality.  Patient was admitted for further evaluation.   Assessment and plan: Acute on chronic kidney disease stage IIIa: Baseline creatinine around 1.8 about 3 weeks ago.   Creatinine at admission is around 4.3 at this time.   Likely from dehydration in the setting of COVID-19 infection with hypotension. Continue IV fluid resuscitation. Hold antihypertensive medications. Follow-up metabolic panel. Serum creatinine improving.  4.35 > 4.30 > 3.09  COVID-19 infection : Patient does not have hypoxia.  Remains on room air.   Chest x-ray does not show infiltrates.  Continue to monitor, check inflammatory markers.  History of stroke, prior cerebellar hemorrhage: Continue Plavix  and statin. Patient has baseline left-sided weakness.  Hypertension: Patient was hypotensive at presentation with acute renal failure . Hold antihypertensive medications ( ARB,  spironolactone  and beta-blockers ) . Start As needed IV hydralazine .  History of chronic pain:   Continue fentanyl  patch and  Percocet as needed.    Depression:   Continue Wellbutrin  and BuSpar.  Sleep apnea:    Continue CPAP at bedtime.  BPH : Continue Flomax .

## 2024-03-16 NOTE — ED Notes (Signed)
 Patient is resting with eyes closed. RR are even and unlabored.

## 2024-03-16 NOTE — TOC CM/SW Note (Signed)
 Transition of Care [facility]) - Inpatient Brief Assessment   Patient Details  Name: Jake Brown MRN: [id number] Date of Birth: [birth year]  Transition of Care [facility]) CM/SW Contact:    Inocente GORMAN Kindle, LCSW Phone Number: 03/16/2024, 4:54 PM   Clinical Narrative: Patient admitted from home and has PCP. SDOH resources provided for follow up.    Transition of Care Asessment: Insurance and Status: Insurance coverage has been reviewed Patient has primary care physician: Yes Home environment has been reviewed: From home Prior level of function:: Independent Prior/Current Home Services: No current home services Social Drivers of Health Review: SDOH reviewed interventions complete Readmission risk has been reviewed: Yes Transition of care needs: no transition of care needs at this time

## 2024-03-16 NOTE — Plan of Care (Signed)
  Problem: Education: Goal: Knowledge of risk factors and measures for prevention of condition will improve Outcome: Progressing   Problem: Coping: Goal: Psychosocial and spiritual needs will be supported Outcome: Progressing   Problem: Respiratory: Goal: Will maintain a patent airway Outcome: Progressing   Problem: Education: Goal: Knowledge of General Education information will improve Description: Including pain rating scale, medication(s)/side effects and non-pharmacologic comfort measures Outcome: Progressing   Problem: Health Behavior/Discharge Planning: Goal: Ability to manage health-related needs will improve Outcome: Progressing   Problem: Clinical Measurements: Goal: Ability to maintain clinical measurements within normal limits will improve Outcome: Progressing Goal: Respiratory complications will improve Outcome: Progressing   Problem: Nutrition: Goal: Adequate nutrition will be maintained Outcome: Progressing   Problem: Coping: Goal: Level of anxiety will decrease Outcome: Progressing   Problem: Elimination: Goal: Will not experience complications related to bowel motility Outcome: Progressing   Problem: Pain Managment: Goal: General experience of comfort will improve and/or be controlled Outcome: Progressing   Problem: Safety: Goal: Ability to remain free from injury will improve Outcome: Progressing   Problem: Skin Integrity: Goal: Risk for impaired skin integrity will decrease Outcome: Progressing

## 2024-03-16 NOTE — Progress Notes (Signed)
   03/16/24 2053  BiPAP/CPAP/SIPAP  Reason BIPAP/CPAP not in use Non-compliant (Pt states he does not wear CPAP at home.)  BiPAP/CPAP /SiPAP Vitals  Pulse Rate 69  Resp 18  SpO2 93 %  MEWS Score/Color  MEWS Score 0  MEWS Score Color Green

## 2024-03-16 NOTE — ED Notes (Signed)
 Patient incontinent with loose bowel movement. Cleaned up and linen was changed at this time. Pt has no other needs at this moment.

## 2024-03-16 NOTE — Discharge Instructions (Signed)

## 2024-03-17 ENCOUNTER — Inpatient Hospital Stay (HOSPITAL_COMMUNITY)

## 2024-03-17 DIAGNOSIS — R0602 Shortness of breath: Secondary | ICD-10-CM | POA: Diagnosis not present

## 2024-03-17 DIAGNOSIS — R0989 Other specified symptoms and signs involving the circulatory and respiratory systems: Secondary | ICD-10-CM | POA: Diagnosis not present

## 2024-03-17 DIAGNOSIS — N179 Acute kidney failure, unspecified: Secondary | ICD-10-CM | POA: Diagnosis not present

## 2024-03-17 LAB — PROCALCITONIN: Procalcitonin: <0.1 ng/mL

## 2024-03-17 LAB — PHOSPHORUS: Phosphorus: 3.1 mg/dL (ref 2.5–4.6)

## 2024-03-17 LAB — URINALYSIS, ROUTINE W REFLEX MICROSCOPIC
Bacteria, UA: NONE SEEN
Bilirubin Urine: NEGATIVE
Glucose, UA: NEGATIVE mg/dL
Ketones, ur: NEGATIVE mg/dL
Leukocytes,Ua: NEGATIVE
Nitrite: NEGATIVE
Protein, ur: NEGATIVE mg/dL
Specific Gravity, Urine: 1.012 (ref 1.005–1.030)
pH: 5 (ref 5.0–8.0)

## 2024-03-17 LAB — BASIC METABOLIC PANEL WITH GFR
Anion gap: 10 (ref 5–15)
BUN: 31 mg/dL — ABNORMAL HIGH (ref 6–20)
CO2: 22 mmol/L (ref 22–32)
Calcium: 8.4 mg/dL — ABNORMAL LOW (ref 8.9–10.3)
Chloride: 105 mmol/L (ref 98–111)
Creatinine, Ser: 1.83 mg/dL — ABNORMAL HIGH (ref 0.61–1.24)
GFR, Estimated: 42 mL/min — ABNORMAL LOW (ref >60)
Glucose, Bld: 92 mg/dL (ref 70–99)
Potassium: 3.7 mmol/L (ref 3.5–5.1)
Sodium: 137 mmol/L (ref 135–145)

## 2024-03-17 LAB — CREATININE, URINE, RANDOM: Creatinine, Urine: 115 mg/dL

## 2024-03-17 LAB — URIC ACID: Uric Acid, Serum: 9.1 mg/dL — ABNORMAL HIGH (ref 3.7–8.6)

## 2024-03-17 LAB — TSH: TSH: 0.415 u[IU]/mL (ref 0.350–4.500)

## 2024-03-17 LAB — T4, FREE: Free T4: 1.12 ng/dL (ref 0.61–1.12)

## 2024-03-17 LAB — OSMOLALITY: Osmolality: 299 mosm/kg — ABNORMAL HIGH (ref 275–295)

## 2024-03-17 LAB — OSMOLALITY, URINE: Osmolality, Ur: 451 mosm/kg (ref 300–900)

## 2024-03-17 LAB — MAGNESIUM: Magnesium: 1.9 mg/dL (ref 1.7–2.4)

## 2024-03-17 LAB — GLUCOSE, CAPILLARY: Glucose-Capillary: 123 mg/dL — ABNORMAL HIGH (ref 70–99)

## 2024-03-17 LAB — SODIUM, URINE, RANDOM: Sodium, Ur: 69 mmol/L

## 2024-03-17 LAB — C-REACTIVE PROTEIN: CRP: 0.6 mg/dL (ref <1.0)

## 2024-03-17 MED ORDER — POTASSIUM CHLORIDE CRYS ER 20 MEQ PO TBCR
40.0000 meq | EXTENDED_RELEASE_TABLET | Freq: Once | ORAL | Status: AC
  Administered 2024-03-17: 40 meq via ORAL
  Filled 2024-03-17: qty 2

## 2024-03-17 MED ORDER — ENOXAPARIN SODIUM 40 MG/0.4ML IJ SOSY
40.0000 mg | PREFILLED_SYRINGE | INTRAMUSCULAR | Status: DC
  Filled 2024-03-17: qty 0.4

## 2024-03-17 MED ORDER — ALLOPURINOL 100 MG PO TABS
100.0000 mg | ORAL_TABLET | Freq: Every day | ORAL | Status: DC
  Administered 2024-03-17 – 2024-03-18 (×2): 100 mg via ORAL
  Filled 2024-03-17 (×2): qty 1

## 2024-03-17 MED ORDER — MAGNESIUM SULFATE IN D5W 1-5 GM/100ML-% IV SOLN
1.0000 g | Freq: Once | INTRAVENOUS | Status: AC
  Administered 2024-03-17: 1 g via INTRAVENOUS
  Filled 2024-03-17: qty 100

## 2024-03-17 MED ORDER — AMLODIPINE BESYLATE 5 MG PO TABS
5.0000 mg | ORAL_TABLET | Freq: Every day | ORAL | Status: DC
  Administered 2024-03-17 – 2024-03-18 (×2): 5 mg via ORAL
  Filled 2024-03-17 (×2): qty 1

## 2024-03-17 MED ORDER — LACTATED RINGERS IV SOLN: INTRAVENOUS | Status: DC

## 2024-03-17 MED ORDER — SALINE SPRAY 0.65 % NA SOLN
1.0000 | NASAL | Status: DC | PRN
  Administered 2024-03-17: 1 via NASAL
  Filled 2024-03-17: qty 44

## 2024-03-17 NOTE — Progress Notes (Addendum)
 PROGRESS NOTE                                                                                                                                                                                                             Patient Demographics:    Jake Brown, is a 58 y.o. male, DOB - 1966-07-21, FMW:[id number]  Outpatient Primary MD for the patient is Gretel App, NP    LOS - 1  Admit date - 03/15/2024    Chief Complaint  Patient presents with   Weakness       Brief Narrative (HPI from H&P)    58 y.o. male with history of prior stroke and left cerebellar hemorrhage likely from hypertensive emergency in January of 2025 with resultant left-sided hemiparesis, hypertension, chronic kidney disease stage III baseline creatinine around 1.8, BPH, depression, sleep apnea presents to the ER because of increasing weakness confusion over the last 2 to 3 days.  Patient also has been having nasal congestion and cough.  Denies any nausea vomiting or diarrhea.  Patient states that he almost passed out.  In the ER he was found to be dehydrated hypotensive, COVID-19 test was positive and he was admitted for further care.   Subjective:    Jake Brown today has, No headache, No chest pain, No abdominal pain - No Nausea, No new weakness tingling or numbness, no SOB   Assessment  & Plan :   Acute on chronic kidney disease stage III baseline creatinine around 1.8 about 3 weeks ago, he likely contracted COVID-19 infection, thereafter his oral intake was significantly poor, he was also taking an ARB along with diuretic for his blood pressure, he got severely dehydrated and subsequently developed AKI, continue hydration with IV fluids, blood pressure now improving.  Renal function is improving will monitor.  Of note patient had right kidney removal due to history of renal cancer in the past. COVID-19 infection.  No clinical signs of pneumonia,  inflammatory markers stable monitor. History of stroke with left-sided weakness and prior cerebellar hemorrhage takes Plavix  statins. Hypertension due to #1 above hold diuretics and ARB, will resume low-dose Norvasc .  As needed IV hydralazine . History of chronic pain on fentanyl  patch and also Percocet as needed.  I am increasing the intervals for receiving Percocet due to worsening renal function. Depression on Wellbutrin  and BuSpar. Sleep  apnea on CPAP at bedtime. BPH on Flomax . High uric acid.  Placed on allopurinol . History of sinus bradycardia.  Blood pressure stable, stable TSH free T4, minimize narcotic use.         Condition - Fair  Family Communication  : None present  Code Status :   Full  Consults  :  None  PUD Prophylaxis :    Procedures  :            Disposition Plan  :    Status is: Inpatient  DVT Prophylaxis  :    enoxaparin  (LOVENOX ) injection 30 mg Start: 03/16/24 1000    Lab Results  Component Value Date   PLT 133 (L) 03/16/2024    Diet :  Diet Order             Diet Heart Room service appropriate? Yes; Fluid consistency: Thin  Diet effective now                    Inpatient Medications  Scheduled Meds:  allopurinol   100 mg Oral Daily   atorvastatin   40 mg Oral Daily   buPROPion   150 mg Oral Daily   clopidogrel   75 mg Oral Daily   cyanocobalamin   5,000 mcg Oral Daily   enoxaparin  (LOVENOX ) injection  30 mg Subcutaneous Q24H   fentaNYL   1 patch Transdermal Q3 days   tamsulosin   0.4 mg Oral Daily   Continuous Infusions:  lactated ringers  75 mL/hr at 03/17/24 0905   PRN Meds:.acetaminophen  **OR** acetaminophen , hydrALAZINE , ondansetron  (ZOFRAN ) IV, [DISCONTINUED] oxyCODONE -acetaminophen  **AND** oxyCODONE , sodium chloride   Antibiotics  :    Anti-infectives (From admission, onward)    None         Objective:   Vitals:   03/17/24 0248 03/17/24 0400 03/17/24 0600 03/17/24 0829  BP: 134/72 (!) 140/73  (!) 142/87   Pulse: (!) 55   (!) 53  Resp: 17   18  Temp: 98.2 F (36.8 C)   98.1 F (36.7 C)  TempSrc: Oral   Oral  SpO2: 93% 96% 97% 94%  Weight:      Height:        Wt Readings from Last 3 Encounters:  03/16/24 124.1 kg  02/24/24 125.9 kg  11/24/23 126.8 kg     Intake/Output Summary (Last 24 hours) at 03/17/2024 0949 Last data filed at 03/17/2024 9357 Gross per 24 hour  Intake --  Output 400 ml  Net -400 ml     Physical Exam  Awake Alert, No new F.N deficits, Normal affect [place].AT,PERRAL Supple Neck, No JVD,   Symmetrical Chest wall movement, Good air movement bilaterally, CTAB RRR,No Gallops,Rubs or new Murmurs,  +ve B.Sounds, Abd Soft, No tenderness,   No Cyanosis, Clubbing or edema       Data Review:    Recent Labs  Lab 03/15/24 1908 03/15/24 2010 03/16/24 0554  WBC 7.8  --  5.3  HGB 14.0 14.3 13.1  HCT 42.4 42.0 39.3  PLT 193  --  133*  MCV 92.4  --  92.7  MCH 30.5  --  30.9  MCHC 33.0  --  33.3  RDW 13.6  --  13.7  LYMPHSABS 1.7  --  1.7  MONOABS 0.9  --  0.5  EOSABS 0.0  --  0.0  BASOSABS 0.0  --  0.0    Recent Labs  Lab 03/15/24 1908 03/15/24 2010 03/15/24 2130 03/16/24 0554 03/17/24 0617  NA 135 137  --  134*  137  K 3.8 3.8  --  3.8 3.7  CL 101 103  --  102 105  CO2 19*  --   --  20* 22  ANIONGAP 15  --   --  12 10  GLUCOSE 97 94  --  87 92  BUN 47* 44*  --  40* 31*  CREATININE 4.35* 4.30*  --  3.09* 1.83*  AST 50*  --   --  49*  --   ALT 28  --   --  27  --   ALKPHOS 63  --   --  58  --   BILITOT 0.6  --   --  0.6  --   ALBUMIN 3.4*  --   --  3.0*  --   CRP  --   --   --  0.6 0.6  DDIMER  --   --   --  0.38  --   PROCALCITON  --   --   --   --  <0.10  LATICACIDVEN  --  2.1* 1.1  --   --   TSH  --   --   --  0.431 0.415  MG  --   --   --   --  1.9  PHOS  --   --   --   --  3.1  CALCIUM  8.3*  --   --  8.3* 8.4*      Recent Labs  Lab 03/15/24 1908 03/15/24 2010 03/15/24 2130 03/16/24 0554 03/17/24 0617  CRP  --   --   --  0.6  0.6  DDIMER  --   --   --  0.38  --   PROCALCITON  --   --   --   --  <0.10  LATICACIDVEN  --  2.1* 1.1  --   --   TSH  --   --   --  0.431 0.415  MG  --   --   --   --  1.9  CALCIUM  8.3*  --   --  8.3* 8.4*    --------------------------------------------------------------------------------------------------------------- Lab Results  Component Value Date   CHOL 159 07/29/2023   HDL 50 07/29/2023   LDLCALC 89 07/29/2023   TRIG 100 07/29/2023   CHOLHDL 3.2 07/29/2023    Lab Results  Component Value Date   HGBA1C 5.6 07/29/2023   Recent Labs    03/17/24 0617  TSH 0.415  FREET4 1.12       Radiology Report DG Chest Port 1 View Result Date: 03/17/2024 EXAM: 1 VIEW XRAY OF THE CHEST 03/17/2024 06:12:00 AM COMPARISON: 03/15/2024 CLINICAL HISTORY: SOB (shortness of breath) 141880. Reason for exam: sob FINDINGS: LUNGS AND PLEURA: Low lung volumes. No focal pulmonary opacity. No pulmonary edema. No pleural effusion. No pneumothorax. HEART AND MEDIASTINUM: No acute abnormality of the cardiac and mediastinal silhouettes. BONES AND SOFT TISSUES: Old left rib fractures. No acute osseous abnormality. IMPRESSION: 1. No acute process. 2. Low lung volumes. Electronically signed by: Waddell Calk MD 03/17/2024 06:50 AM EDT RP Workstation: [id number]   CT Head Wo Contrast Result Date: 03/15/2024 CLINICAL DATA:  Acute neuro deficit. EXAM: CT HEAD WITHOUT CONTRAST TECHNIQUE: Contiguous axial images were obtained from the base of the skull through the vertex without intravenous contrast. RADIATION DOSE REDUCTION: This exam was performed according to the departmental dose-optimization program which includes automated exposure control, adjustment of the mA and/or kV according to patient size and/or use of iterative reconstruction technique. COMPARISON:  September 04, 2023 FINDINGS: Brain: There is generalized cerebral atrophy with widening of the extra-axial spaces and ventricular dilatation. There are  areas of decreased attenuation within the white matter tracts of the supratentorial brain, consistent with microvascular disease changes. Small, chronic right basal ganglia lacunar infarcts are noted. Vascular: No hyperdense vessel or unexpected calcification. Skull: Normal. Negative for fracture or focal lesion. Sinuses/Orbits: No acute finding. Other: None. IMPRESSION: 1. Generalized cerebral atrophy and microvascular disease changes of the supratentorial brain. 2. Small, chronic right basal ganglia lacunar infarcts. 3. No acute intracranial abnormality. Electronically Signed   By: Suzen Dials M.D.   On: 03/15/2024 20:23   DG Chest Port 1 View Result Date: 03/15/2024 CLINICAL DATA:  Cough and congestion. Sudden onset of balance problems, weakness, dropping things. EXAM: PORTABLE CHEST 1 VIEW COMPARISON:  07/28/2023 FINDINGS: Heart size and pulmonary vascularity are normal. Lungs are clear. No pleural effusion or pneumothorax. Degenerative changes in the spine and shoulders. IMPRESSION: No active disease. Electronically Signed   By: Elsie Gravely M.D.   On: 03/15/2024 19:47     Signature  -   Lavada Stank M.D on 03/17/2024 at 9:49 AM   -  To page go to [email or web address]

## 2024-03-17 NOTE — Plan of Care (Signed)
  Problem: Health Behavior/Discharge Planning: ?Goal: Ability to manage health-related needs will improve ?Outcome: Progressing ?  ?Problem: Clinical Measurements: ?Goal: Will remain free from infection ?Outcome: Progressing ?  ?Problem: Activity: ?Goal: Risk for activity intolerance will decrease ?Outcome: Progressing ?  ?Problem: Elimination: ?Goal: Will not experience complications related to bowel motility ?Outcome: Progressing ?  ?Problem: Skin Integrity: ?Goal: Risk for impaired skin integrity will decrease ?Outcome: Progressing ?  ?

## 2024-03-17 NOTE — Progress Notes (Signed)
 Patient sats sustaining in between 87-90% in RA while sleeping and has also complaining of nasal congestion. Informed MD Rathore, gave prescribed nasal spray and placed on 2LNC.

## 2024-03-18 ENCOUNTER — Other Ambulatory Visit (HOSPITAL_COMMUNITY): Payer: Self-pay

## 2024-03-18 DIAGNOSIS — N179 Acute kidney failure, unspecified: Secondary | ICD-10-CM | POA: Diagnosis not present

## 2024-03-18 LAB — CBC WITH DIFFERENTIAL/PLATELET
Abs Immature Granulocytes: 0.02 K/uL (ref 0.00–0.07)
Basophils Absolute: 0 K/uL (ref 0.0–0.1)
Basophils Relative: 1 %
Eosinophils Absolute: 0.1 K/uL (ref 0.0–0.5)
Eosinophils Relative: 2 %
HCT: 39.8 % (ref 39.0–52.0)
Hemoglobin: 13.4 g/dL (ref 13.0–17.0)
Immature Granulocytes: 0 %
Lymphocytes Relative: 35 %
Lymphs Abs: 1.6 K/uL (ref 0.7–4.0)
MCH: 30.7 pg (ref 26.0–34.0)
MCHC: 33.7 g/dL (ref 30.0–36.0)
MCV: 91.1 fL (ref 80.0–100.0)
Monocytes Absolute: 0.4 K/uL (ref 0.1–1.0)
Monocytes Relative: 9 %
Neutro Abs: 2.5 K/uL (ref 1.7–7.7)
Neutrophils Relative %: 53 %
Platelets: 138 K/uL — ABNORMAL LOW (ref 150–400)
RBC: 4.37 MIL/uL (ref 4.22–5.81)
RDW: 13.4 % (ref 11.5–15.5)
WBC: 4.7 K/uL (ref 4.0–10.5)
nRBC: 0 % (ref 0.0–0.2)

## 2024-03-18 LAB — BASIC METABOLIC PANEL WITH GFR
Anion gap: 12 (ref 5–15)
BUN: 20 mg/dL (ref 6–20)
CO2: 21 mmol/L — ABNORMAL LOW (ref 22–32)
Calcium: 8.8 mg/dL — ABNORMAL LOW (ref 8.9–10.3)
Chloride: 108 mmol/L (ref 98–111)
Creatinine, Ser: 1.46 mg/dL — ABNORMAL HIGH (ref 0.61–1.24)
GFR, Estimated: 55 mL/min — ABNORMAL LOW (ref >60)
Glucose, Bld: 89 mg/dL (ref 70–99)
Potassium: 4.2 mmol/L (ref 3.5–5.1)
Sodium: 141 mmol/L (ref 135–145)

## 2024-03-18 LAB — MAGNESIUM: Magnesium: 1.9 mg/dL (ref 1.7–2.4)

## 2024-03-18 MED ORDER — ALLOPURINOL 100 MG PO TABS
100.0000 mg | ORAL_TABLET | Freq: Every day | ORAL | 0 refills | Status: AC
  Filled 2024-03-18: qty 30, 30d supply, fill #0

## 2024-03-18 MED ORDER — SPIRONOLACTONE 25 MG PO TABS: 25.0000 mg | ORAL_TABLET | Freq: Every day | ORAL | Status: AC

## 2024-03-18 MED ORDER — AMLODIPINE BESYLATE 10 MG PO TABS
10.0000 mg | ORAL_TABLET | Freq: Every day | ORAL | 0 refills | Status: AC
  Filled 2024-03-18: qty 30, 30d supply, fill #0

## 2024-03-18 MED ORDER — AMLODIPINE BESYLATE 10 MG PO TABS: 10.0000 mg | ORAL_TABLET | Freq: Every day | ORAL | Status: DC

## 2024-03-18 NOTE — Discharge Summary (Signed)
 SHAH INSLEY FMW:[id number] DOB: 09-01-1965 DOA: 03/15/2024  PCP: Gretel App, NP  Admit date: 03/15/2024  Discharge date: 03/18/2024  Admitted From: Home   Disposition:  Home   Recommendations for Outpatient Follow-up:   Follow up with PCP in 1-2 weeks  PCP Please obtain BMP/CBC, 2 view CXR in 1week,  (see Discharge instructions)   PCP Please follow up on the following pending results:    Home Health: None   Equipment/Devices: None  Consultations: None  Discharge Condition: Stable    CODE STATUS: Full    Diet Recommendation: Heart Healthy     Chief Complaint  Patient presents with   Weakness     Brief history of present illness from the day of admission and additional interim summary    58 y.o. male with history of prior stroke and left cerebellar hemorrhage likely from hypertensive emergency in January of 2025 with resultant left-sided hemiparesis, hypertension, chronic kidney disease stage III baseline creatinine around 1.8, BPH, depression, sleep apnea presents to the ER because of increasing weakness confusion over the last 2 to 3 days.  Patient also has been having nasal congestion and cough.  Denies any nausea vomiting or diarrhea.  Patient states that he almost passed out.  In the ER he was found to be dehydrated hypotensive, COVID-19 test was positive and he was admitted for further care.                                                                  Hospital Course   Acute on chronic kidney disease stage III baseline creatinine around 1.8 about 3 weeks ago, he likely contracted COVID-19 infection, thereafter his oral intake was significantly poor, he was also taking an ARB along with diuretic for his blood pressure, he got severely dehydrated and subsequently developed AKI, adequately  hydrated with IV fluids, blood pressure has stabilized renal function close to baseline, overall feeling much better and symptom-free will be discharged home on adjusted blood pressure medications with close outpatient follow-up with PCP preferably within a week. COVID-19 infection.  No clinical signs of pneumonia, inflammatory markers stable monitor. History of stroke with left-sided weakness and prior cerebellar hemorrhage takes type platelet and statins. Hypertension due to #1 above hold diuretics and ARB, pressure has improved will be placed on 10 mg of Norvasc  and discharged on it, requested to follow-up with PCP in a week for further monitoring and adjustment for now avoiding ARB and diuretic as he is still recovering from his AKI. History of chronic pain on fentanyl  patch and also Percocet as needed.  I am increasing the intervals for receiving Percocet due to worsening renal function. Depression on Wellbutrin  and BuSpar. Sleep apnea on CPAP at bedtime. BPH on Flomax . High uric acid.  Placed on allopurinol . History of  sinus bradycardia.  Blood pressure stable, stable TSH free T4, minimize narcotic use.      Discharge diagnosis     Principal Problem:   ARF (acute renal failure) (HCC) Active Problems:   Chronic pain syndrome   CKD (chronic kidney disease) stage 3, GFR 30-59 ml/min (HCC)   HLD (hyperlipidemia)   Depression, recurrent (HCC)   Hemiparesis affecting left side as late effect of stroke (HCC)   OSA on CPAP   Primary hypertension   COVID-19 virus infection    Discharge instructions    Discharge Instructions     Diet - low sodium heart healthy   Complete by: As directed    Discharge instructions   Complete by: As directed    Follow with Primary MD Gretel App, NP in 7 days   Get CBC, CMP, Magnesium , 2 view Chest X ray -  checked next visit with your primary MD    Activity: As tolerated with Full fall precautions use walker/cane & assistance as  needed  Disposition Home    Diet: Heart Healthy    Special Instructions: If you have smoked or chewed Tobacco  in the last 2 yrs please stop smoking, stop any regular Alcohol  and or any Recreational drug use.  On your next visit with your primary care physician please Get Medicines reviewed and adjusted.  Please request your Prim.MD to go over all Hospital Tests and Procedure/Radiological results at the follow up, please get all Hospital records sent to your Prim MD by signing hospital release before you go home.  If you experience worsening of your admission symptoms, develop shortness of breath, life threatening emergency, suicidal or homicidal thoughts you must seek medical attention immediately by calling 911 or calling your MD immediately  if symptoms less severe.  You Must read complete instructions/literature along with all the possible adverse reactions/side effects for all the Medicines you take and that have been prescribed to you. Take any new Medicines after you have completely understood and accpet all the possible adverse reactions/side effects.   Do not drive when taking Pain medications.  Do not take more than prescribed Pain, Sleep and Anxiety Medications  Wear Seat belts while driving.   Increase activity slowly   Complete by: As directed    No wound care   Complete by: As directed        Discharge Medications   Allergies as of 03/18/2024       Reactions   Gabapentin     Dizziness, almost fell groggy        Medication List     STOP taking these medications    busPIRone 5 MG tablet Commonly known as: BUSPAR   cetirizine  10 MG tablet Commonly known as: ZYRTEC    losartan  25 MG tablet Commonly known as: COZAAR        TAKE these medications    allopurinol  100 MG tablet Commonly known as: ZYLOPRIM  Take 1 tablet (100 mg total) by mouth daily. Start taking on: March 19, 2024   amLODipine  10 MG tablet Commonly known as: NORVASC  Take 1 tablet  (10 mg total) by mouth daily. What changed:  medication strength how much to take   atorvastatin  40 MG tablet Commonly known as: LIPITOR  TAKE ONE TABLET BY MOUTH DAILY AT 6 P.M.   B-12 5000 MCG Caps Take 1 capsule by mouth daily. Patient takes in a gummy form.   baclofen  10 MG tablet Commonly known as: LIORESAL  Take 10 mg by mouth 3 (three) times daily  as needed for muscle spasms.   buPROPion  150 MG 24 hr tablet Commonly known as: WELLBUTRIN  XL TAKE 1 TABLET BY MOUTH DAILY   clopidogrel  75 MG tablet Commonly known as: PLAVIX  Take 75 mg by mouth daily.   fentaNYL  12 MCG/HR Commonly known as: DURAGESIC  Place 1 patch onto the skin every 3 (three) days. Patch was applied on 07/27/2023.   Nebivolol  HCl 20 MG Tabs Take 1 tablet (20 mg total) by mouth daily.   Percocet 10-325 MG tablet Generic drug: oxyCODONE -acetaminophen  Take 1 tablet by mouth every 4 (four) hours as needed.   spironolactone  25 MG tablet Commonly known as: ALDACTONE  Take 1 tablet (25 mg total) by mouth daily. Start taking on: March 21, 2024 What changed: These instructions start on March 21, 2024. If you are unsure what to do until then, ask your doctor or other care provider.   tamsulosin  0.4 MG Caps capsule Commonly known as: FLOMAX  Take 0.4 mg by mouth daily.   UNABLE TO FIND CBD gummy: Take 1 gummy by mouth daily as needed for breakthrough pain   Vitamin D3 125 MCG (5000 UT) Caps Take 1 capsule (5,000 Units total) by mouth daily.         Follow-up Information     Gretel App, NP. Schedule an appointment as soon as possible for a visit in 1 week(s).   Specialty: Nurse Practitioner Contact information: [address] [place] [address] [phone]                 Major procedures and Radiology Reports - PLEASE review detailed and final reports thoroughly  -       DG Chest Port 1 View Result Date: 03/17/2024 EXAM: 1 VIEW XRAY OF THE CHEST 03/17/2024  06:12:00 AM COMPARISON: 03/15/2024 CLINICAL HISTORY: SOB (shortness of breath) 141880. Reason for exam: sob FINDINGS: LUNGS AND PLEURA: Low lung volumes. No focal pulmonary opacity. No pulmonary edema. No pleural effusion. No pneumothorax. HEART AND MEDIASTINUM: No acute abnormality of the cardiac and mediastinal silhouettes. BONES AND SOFT TISSUES: Old left rib fractures. No acute osseous abnormality. IMPRESSION: 1. No acute process. 2. Low lung volumes. Electronically signed by: Waddell Calk MD 03/17/2024 06:50 AM EDT RP Workstation: [id number]   CT Head Wo Contrast Result Date: 03/15/2024 CLINICAL DATA:  Acute neuro deficit. EXAM: CT HEAD WITHOUT CONTRAST TECHNIQUE: Contiguous axial images were obtained from the base of the skull through the vertex without intravenous contrast. RADIATION DOSE REDUCTION: This exam was performed according to the departmental dose-optimization program which includes automated exposure control, adjustment of the mA and/or kV according to patient size and/or use of iterative reconstruction technique. COMPARISON:  September 04, 2023 FINDINGS: Brain: There is generalized cerebral atrophy with widening of the extra-axial spaces and ventricular dilatation. There are areas of decreased attenuation within the white matter tracts of the supratentorial brain, consistent with microvascular disease changes. Small, chronic right basal ganglia lacunar infarcts are noted. Vascular: No hyperdense vessel or unexpected calcification. Skull: Normal. Negative for fracture or focal lesion. Sinuses/Orbits: No acute finding. Other: None. IMPRESSION: 1. Generalized cerebral atrophy and microvascular disease changes of the supratentorial brain. 2. Small, chronic right basal ganglia lacunar infarcts. 3. No acute intracranial abnormality. Electronically Signed   By: Suzen Dials M.D.   On: 03/15/2024 20:23   DG Chest Port 1 View Result Date: 03/15/2024 CLINICAL DATA:  Cough and congestion. Sudden  onset of balance problems, weakness, dropping things. EXAM: PORTABLE CHEST 1 VIEW COMPARISON:  07/28/2023 FINDINGS:  Heart size and pulmonary vascularity are normal. Lungs are clear. No pleural effusion or pneumothorax. Degenerative changes in the spine and shoulders. IMPRESSION: No active disease. Electronically Signed   By: Elsie Gravely M.D.   On: 03/15/2024 19:47    Micro Results     Recent Results (from the past 240 hours)  Resp panel by RT-PCR (RSV, Flu A&B, Covid) Anterior Nasal Swab     Status: Abnormal   Collection Time: 03/15/24  7:57 PM   Specimen: Anterior Nasal Swab  Result Value Ref Range Status   SARS Coronavirus 2 by RT PCR POSITIVE (A) NEGATIVE Final   Influenza A by PCR NEGATIVE NEGATIVE Final   Influenza B by PCR NEGATIVE NEGATIVE Final    Comment: (NOTE) The Xpert Xpress SARS-CoV-2/FLU/RSV plus assay is intended as an aid in the diagnosis of influenza from Nasopharyngeal swab specimens and should not be used as a sole basis for treatment. Nasal washings and aspirates are unacceptable for Xpert Xpress SARS-CoV-2/FLU/RSV testing.  Fact Sheet for Patients: BloggerCourse.com  Fact Sheet for Healthcare Providers: SeriousBroker.it  This test is not yet approved or cleared by the [place]  FDA and has been authorized for detection and/or diagnosis of SARS-CoV-2 by FDA under an Emergency Use Authorization (EUA). This EUA will remain in effect (meaning this test can be used) for the duration of the COVID-19 declaration under Section 564(b)(1) of the Act, 21 U.S.C. section 360bbb-3(b)(1), unless the authorization is terminated or revoked.     Resp Syncytial Virus by PCR NEGATIVE NEGATIVE Final    Comment: (NOTE) Fact Sheet for Patients: BloggerCourse.com  Fact Sheet for Healthcare Providers: SeriousBroker.it  This test is not yet approved or cleared by the  [place]  FDA and has been authorized for detection and/or diagnosis of SARS-CoV-2 by FDA under an Emergency Use Authorization (EUA). This EUA will remain in effect (meaning this test can be used) for the duration of the COVID-19 declaration under Section 564(b)(1) of the Act, 21 U.S.C. section 360bbb-3(b)(1), unless the authorization is terminated or revoked.  Performed at [facility], [address], [place], [place] [address]     Today   Subjective    Corday Wyka today has no headache,no chest abdominal pain,no new weakness tingling or numbness, feels much better wants to go home today.     Objective   Blood pressure (!) 149/98, pulse 79, temperature 98.6 F (37 C), temperature source Oral, resp. rate 17, height 5' 7 (1.702 m), weight 124.1 kg, SpO2 95%.   Intake/Output Summary (Last 24 hours) at 03/18/2024 0908 Last data filed at 03/18/2024 0456 Gross per 24 hour  Intake 1440.4 ml  Output --  Net 1440.4 ml    Exam  Awake Alert, No new F.N deficits,    [place].AT,PERRAL Supple Neck,   Symmetrical Chest wall movement, Good air movement bilaterally, CTAB RRR,No Gallops,   +ve B.Sounds, Abd Soft, Non tender,  No Cyanosis, Clubbing or edema    Data Review   Recent Labs  Lab 03/15/24 1908 03/15/24 2010 03/16/24 0554 03/18/24 0524  WBC 7.8  --  5.3 4.7  HGB 14.0 14.3 13.1 13.4  HCT 42.4 42.0 39.3 39.8  PLT 193  --  133* 138*  MCV 92.4  --  92.7 91.1  MCH 30.5  --  30.9 30.7  MCHC 33.0  --  33.3 33.7  RDW 13.6  --  13.7 13.4  LYMPHSABS 1.7  --  1.7 1.6  MONOABS 0.9  --  0.5  0.4  EOSABS 0.0  --  0.0 0.1  BASOSABS 0.0  --  0.0 0.0    Recent Labs  Lab 03/15/24 1908 03/15/24 2010 03/15/24 2130 03/16/24 0554 03/17/24 0617 03/18/24 0524  NA 135 137  --  134* 137 141  K 3.8 3.8  --  3.8 3.7 4.2  CL 101 103  --  102 105 108  CO2 19*  --   --  20* 22 21*  ANIONGAP 15  --   --  12 10 12   GLUCOSE 97 94  --  87 92 89  BUN 47* 44*  --  40* 31*  20  CREATININE 4.35* 4.30*  --  3.09* 1.83* 1.46*  AST 50*  --   --  49*  --   --   ALT 28  --   --  27  --   --   ALKPHOS 63  --   --  58  --   --   BILITOT 0.6  --   --  0.6  --   --   ALBUMIN 3.4*  --   --  3.0*  --   --   CRP  --   --   --  0.6 0.6  --   DDIMER  --   --   --  0.38  --   --   PROCALCITON  --   --   --   --  <0.10  --   LATICACIDVEN  --  2.1* 1.1  --   --   --   TSH  --   --   --  0.431 0.415  --   MG  --   --   --   --  1.9 1.9  PHOS  --   --   --   --  3.1  --   CALCIUM  8.3*  --   --  8.3* 8.4* 8.8*    Total Time in preparing paper work, data evaluation and todays exam - 35 minutes  Signature  -    Lavada Stank M.D on 03/18/2024 at 9:08 AM   -  To page go to [email or web address]

## 2024-03-18 NOTE — Plan of Care (Signed)
  Problem: Education: Goal: Knowledge of risk factors and measures for prevention of condition will improve Outcome: Completed/Met   Problem: Coping: Goal: Psychosocial and spiritual needs will be supported Outcome: Completed/Met   Problem: Respiratory: Goal: Will maintain a patent airway Outcome: Completed/Met Goal: Complications related to the disease process, condition or treatment will be avoided or minimized Outcome: Completed/Met   Problem: Education: Goal: Knowledge of General Education information will improve Description: Including pain rating scale, medication(s)/side effects and non-pharmacologic comfort measures Outcome: Completed/Met   Problem: Health Behavior/Discharge Planning: Goal: Ability to manage health-related needs will improve Outcome: Completed/Met   Problem: Clinical Measurements: Goal: Ability to maintain clinical measurements within normal limits will improve Outcome: Completed/Met Goal: Will remain free from infection Outcome: Completed/Met Goal: Diagnostic test results will improve Outcome: Completed/Met Goal: Respiratory complications will improve Outcome: Completed/Met Goal: Cardiovascular complication will be avoided Outcome: Completed/Met   Problem: Activity: Goal: Risk for activity intolerance will decrease Outcome: Completed/Met   Problem: Nutrition: Goal: Adequate nutrition will be maintained Outcome: Completed/Met   Problem: Coping: Goal: Level of anxiety will decrease Outcome: Completed/Met   Problem: Elimination: Goal: Will not experience complications related to bowel motility Outcome: Completed/Met Goal: Will not experience complications related to urinary retention Outcome: Completed/Met   Problem: Pain Managment: Goal: General experience of comfort will improve and/or be controlled Outcome: Completed/Met   Problem: Safety: Goal: Ability to remain free from injury will improve Outcome: Completed/Met   Problem: Skin  Integrity: Goal: Risk for impaired skin integrity will decrease Outcome: Completed/Met

## 2024-03-22 ENCOUNTER — Telehealth: Payer: Self-pay

## 2024-03-22 NOTE — Transitions of Care (Post Inpatient/ED Visit) (Signed)
   03/22/2024  Name: Jake Brown MRN: [id number] DOB: Dec 18, 1965  Today's TOC FU Call Status: Today's TOC FU Call Status:: Unsuccessful Call (1st Attempt) Unsuccessful Call (1st Attempt) Date: 03/22/24  Attempted to reach the patient regarding the most recent Inpatient/ED visit. Unable to leave voicemail message, mailbox was full.  Follow Up Plan: Additional outreach attempts will be made to reach the patient to complete the Transitions of Care (Post Inpatient/ED visit) call.   Richerd Fish, RN, BSN, CCM [facility], [facility] RN Care Manager Direct Dial: [phone]

## 2024-03-23 ENCOUNTER — Telehealth: Payer: Self-pay

## 2024-03-23 NOTE — Transitions of Care (Post Inpatient/ED Visit) (Signed)
   03/23/2024  Name: Jake Brown MRN: [id number] DOB: [birth year]  Today's TOC FU Call Status: Today's TOC FU Call Status:: Unsuccessful Call (2nd Attempt) Unsuccessful Call (2nd Attempt) Date: 03/23/24  Attempted to reach the patient regarding the most recent Inpatient/ED visit.  Follow Up Plan: Additional outreach attempts will be made to reach the patient to complete the Transitions of Care (Post Inpatient/ED visit) call.   Shona Prow RN, CCM [place]  [facility] RN Care Manager [phone]

## 2024-03-24 ENCOUNTER — Telehealth: Payer: Self-pay

## 2024-03-24 NOTE — Transitions of Care (Post Inpatient/ED Visit) (Signed)
   03/24/2024  Name: JESSY CYBULSKI MRN: [id number] DOB: [birth year]  Today's TOC FU Call Status: Today's TOC FU Call Status:: Unsuccessful Call (3rd Attempt) Unsuccessful Call (3rd Attempt) Date: 03/24/24  Attempted to reach the patient regarding the most recent Inpatient/ED visit.  Follow Up Plan: No further outreach attempts will be made at this time. We have been unable to contact the patient.  Jamaris Theard J. Tonnya Garbett RN, MSN [facility], [facility] RN Care Manager Direct Dial: [phone]  Fax: [phone] Website: [email or web address]

## 2024-04-06 DIAGNOSIS — M5412 Radiculopathy, cervical region: Secondary | ICD-10-CM | POA: Diagnosis not present

## 2024-04-06 DIAGNOSIS — M48062 Spinal stenosis, lumbar region with neurogenic claudication: Secondary | ICD-10-CM | POA: Diagnosis not present

## 2024-04-06 DIAGNOSIS — M25561 Pain in right knee: Secondary | ICD-10-CM | POA: Diagnosis not present

## 2024-04-06 DIAGNOSIS — M4726 Other spondylosis with radiculopathy, lumbar region: Secondary | ICD-10-CM | POA: Diagnosis not present

## 2024-05-06 NOTE — Progress Notes (Signed)
 TAREQ DWAN                                          MRN: [id number]   05/06/2024   The [facility]BCI Quality Team Specialist reviewed this patient medical record for the purposes of chart review for care gap closure. The following were reviewed: abstraction for care gap closure-controlling blood pressure.    VBCI Quality Team

## 2024-05-18 ENCOUNTER — Other Ambulatory Visit: Payer: Self-pay | Admitting: Nurse Practitioner

## 2024-05-18 DIAGNOSIS — Z8673 Personal history of transient ischemic attack (TIA), and cerebral infarction without residual deficits: Secondary | ICD-10-CM

## 2024-05-18 DIAGNOSIS — E785 Hyperlipidemia, unspecified: Secondary | ICD-10-CM

## 2024-05-31 ENCOUNTER — Ambulatory Visit (INDEPENDENT_AMBULATORY_CARE_PROVIDER_SITE_OTHER): Admitting: Nurse Practitioner

## 2024-05-31 VITALS — BP 106/74 | HR 60 | Temp 97.6°F | Ht 67.0 in | Wt 275.0 lb

## 2024-05-31 DIAGNOSIS — N1831 Chronic kidney disease, stage 3a: Secondary | ICD-10-CM

## 2024-05-31 DIAGNOSIS — E785 Hyperlipidemia, unspecified: Secondary | ICD-10-CM

## 2024-05-31 DIAGNOSIS — I10 Essential (primary) hypertension: Secondary | ICD-10-CM

## 2024-05-31 DIAGNOSIS — G894 Chronic pain syndrome: Secondary | ICD-10-CM | POA: Diagnosis not present

## 2024-05-31 NOTE — Progress Notes (Signed)
 Leron Glance, NP-C Phone: [phone]  Jake Brown is a 58 y.o. male who presents today for annual exam.   Discussed the use of AI scribe software for clinical note transcription with the patient, who gave verbal consent to proceed.  History of Present Illness   Jake Brown is a 58 year old male with hypertension and chronic pain who presents for follow-up on blood pressure management and pain control.  He is currently taking amlodipine , hydralazine , losartan , spironolactone , and Bystolic  for blood pressure. He organizes his medications weekly and takes them consistently. No chest pain, shortness of breath, dizziness, or swelling.  He continues to take atorvastatin  for cholesterol management and is unsure about his use of clopidogrel  for stroke prevention. He is also on Wellbutrin  and reports doing well with it.  He is experiencing significant back pain. He regrets a past back surgery, noting that some symptoms have not improved, particularly when getting up, sitting down, or sleeping. He uses a cane and has an expired handicap placard. He mentions a recent fall that resulted in a large bruise, but he did not seek medical attention as the swelling eventually subsided.  He reports worsening arthritis pain, particularly in the shoulder and bicep area, exacerbated by cold weather. He is unable to take NSAIDs due to having only one kidney. He is currently on Percocet and fentanyl  for pain but is cautious about Tylenol  intake due to its presence in Percocet.  He mentions a recent COVID-19 infection during which a benign spot was noted on his kidney imaging. He has a history of kidney cancer and only has one kidney, which causes concern about the imaging findings.  He is actively involved in caring for his elderly parents, who have significant health issues. His father recently fell, and his mother requires assistance due to cognitive decline. He has arranged for caregivers to assist  his mother several times a week.      Social History   Tobacco Use  Smoking Status Former   Current packs/day: 0.00   Average packs/day: 1 pack/day for 20.0 years (20.0 ttl pk-yrs)   Types: Cigarettes   Start date: 07/21/1973   Quit date: 07/21/1993   Years since quitting: 30.9  Smokeless Tobacco Former    Current Outpatient Medications on File Prior to Visit  Medication Sig Dispense Refill   allopurinol  (ZYLOPRIM ) 100 MG tablet Take 1 tablet (100 mg total) by mouth daily. 30 tablet 0   amLODipine  (NORVASC ) 10 MG tablet Take 1 tablet (10 mg total) by mouth daily. 30 tablet 0   atorvastatin  (LIPITOR ) 40 MG tablet TAKE 1 TABLET BY MOUTH DAILY AT 6 IN THE EVEINING 90 tablet 3   buPROPion  (WELLBUTRIN  XL) 150 MG 24 hr tablet TAKE 1 TABLET BY MOUTH DAILY 90 tablet 1   Cholecalciferol  (VITAMIN D3) 125 MCG (5000 UT) CAPS Take 1 capsule (5,000 Units total) by mouth daily. 90 capsule 3   Cyanocobalamin  (B-12) 5000 MCG CAPS Take 1 capsule by mouth daily. Patient takes in a gummy form.     fentaNYL  (DURAGESIC ) 12 MCG/HR Place 1 patch onto the skin every 3 (three) days. Patch was applied on 07/27/2023.     Nebivolol  HCl 20 MG TABS Take 1 tablet (20 mg total) by mouth daily. 30 tablet 5   PERCOCET 10-325 MG tablet Take 1 tablet by mouth every 4 (four) hours as needed.     spironolactone  (ALDACTONE ) 25 MG tablet Take 1 tablet (25 mg total) by mouth daily.  tamsulosin  (FLOMAX ) 0.4 MG CAPS capsule Take 0.4 mg by mouth daily.     clopidogrel  (PLAVIX ) 75 MG tablet Take 75 mg by mouth daily.     No current facility-administered medications on file prior to visit.     ROS see history of present illness  Objective  Physical Exam Vitals:   05/31/24 1530  BP: 106/74  Pulse: 60  Temp: 97.6 F (36.4 C)  SpO2: 95%    BP Readings from Last 3 Encounters:  05/31/24 106/74  03/18/24 (!) 149/98  02/24/24 (!) 152/100   Wt Readings from Last 3 Encounters:  05/31/24 275 lb (124.7 kg)  03/16/24  273 lb 9.5 oz (124.1 kg)  02/24/24 277 lb 9.6 oz (125.9 kg)    Physical Exam Constitutional:      General: He is not in acute distress.    Appearance: Normal appearance.  HENT:     Head: Normocephalic.  Cardiovascular:     Rate and Rhythm: Normal rate and regular rhythm.     Heart sounds: Normal heart sounds.  Pulmonary:     Effort: Pulmonary effort is normal.     Breath sounds: Normal breath sounds.  Skin:    General: Skin is warm and dry.  Neurological:     General: No focal deficit present.     Mental Status: He is alert.  Psychiatric:        Mood and Affect: Mood normal.        Behavior: Behavior normal.      Assessment/Plan: Please see individual problem list.  Primary hypertension Assessment & Plan: Hypertension is stable today in office on current medication regimen. Continue amlodipine , hydralazine , losartan , spironolactone , and Bystolic . Adequately controlled per nephrology. Encourage home monitoring. Follow up with nephrology as scheduled.    Stage 3a chronic kidney disease (HCC) Assessment & Plan: Chronic kidney disease persists following nephrectomy. Recent imaging revealed a benign kidney spot, likely a cyst. Continue current medication regimen and avoid NSAIDs. Follow up with Nephrology as scheduled.    Chronic pain syndrome Assessment & Plan: Chronic pain continues post-surgery with dissatisfaction in the outcome. Baclofen  was discontinued due to dizziness and a fall. Continue follow-up with pain management and discuss the potential for handicap placard renewal.   Hyperlipidemia, unspecified hyperlipidemia type Assessment & Plan: Hyperlipidemia is managed with atorvastatin . Continue atorvastatin  for cholesterol management.      Return in about 4 months (around 09/28/2024) for Follow up.   Leron Glance, NP-C [facility]

## 2024-06-01 DIAGNOSIS — M48062 Spinal stenosis, lumbar region with neurogenic claudication: Secondary | ICD-10-CM | POA: Diagnosis not present

## 2024-06-01 DIAGNOSIS — M5412 Radiculopathy, cervical region: Secondary | ICD-10-CM | POA: Diagnosis not present

## 2024-06-01 DIAGNOSIS — M4726 Other spondylosis with radiculopathy, lumbar region: Secondary | ICD-10-CM | POA: Diagnosis not present

## 2024-06-01 DIAGNOSIS — M25561 Pain in right knee: Secondary | ICD-10-CM | POA: Diagnosis not present

## 2024-06-13 ENCOUNTER — Encounter: Payer: Self-pay | Admitting: Nurse Practitioner

## 2024-06-13 NOTE — Assessment & Plan Note (Signed)
 Hyperlipidemia is managed with atorvastatin . Continue atorvastatin  for cholesterol management.

## 2024-06-13 NOTE — Assessment & Plan Note (Signed)
 Chronic pain continues post-surgery with dissatisfaction in the outcome. Baclofen  was discontinued due to dizziness and a fall. Continue follow-up with pain management and discuss the potential for handicap placard renewal.

## 2024-06-13 NOTE — Assessment & Plan Note (Signed)
 Hypertension is stable today in office on current medication regimen. Continue amlodipine , hydralazine , losartan , spironolactone , and Bystolic . Adequately controlled per nephrology. Encourage home monitoring. Follow up with nephrology as scheduled.

## 2024-06-13 NOTE — Assessment & Plan Note (Signed)
 Chronic kidney disease persists following nephrectomy. Recent imaging revealed a benign kidney spot, likely a cyst. Continue current medication regimen and avoid NSAIDs. Follow up with Nephrology as scheduled.

## 2024-06-24 NOTE — Progress Notes (Signed)
 Jake Brown                                          MRN: [id number]   06/24/2024   The VBCI Quality Team Specialist reviewed this patient medical record for the purposes of chart review for care gap closure. The following were reviewed: chart review for care gap closure-kidney health evaluation for diabetes:eGFR  and uACR.    VBCI Quality Team

## 2024-07-06 DIAGNOSIS — B351 Tinea unguium: Secondary | ICD-10-CM | POA: Diagnosis not present

## 2024-07-06 DIAGNOSIS — M79675 Pain in left toe(s): Secondary | ICD-10-CM | POA: Diagnosis not present

## 2024-07-06 DIAGNOSIS — M79674 Pain in right toe(s): Secondary | ICD-10-CM | POA: Diagnosis not present

## 2024-07-09 ENCOUNTER — Other Ambulatory Visit: Payer: Self-pay | Admitting: Nurse Practitioner

## 2024-07-09 DIAGNOSIS — F339 Major depressive disorder, recurrent, unspecified: Secondary | ICD-10-CM

## 2024-09-05 ENCOUNTER — Ambulatory Visit: Payer: Medicare Other

## 2024-09-28 ENCOUNTER — Ambulatory Visit: Admitting: Nurse Practitioner
